# Patient Record
Sex: Male | Born: 1950 | Race: White | Hispanic: No | Marital: Married | Smoking: Former smoker
Health system: Southern US, Community
[De-identification: ages and names within clinical notes are randomized; demographics above are authoritative.]

## PROBLEM LIST (undated history)

## (undated) DIAGNOSIS — R569 Unspecified convulsions: Secondary | ICD-10-CM

## (undated) DIAGNOSIS — Z7901 Long term (current) use of anticoagulants: Secondary | ICD-10-CM

## (undated) DIAGNOSIS — I700 Atherosclerosis of aorta: Secondary | ICD-10-CM

## (undated) DIAGNOSIS — I639 Cerebral infarction, unspecified: Secondary | ICD-10-CM

## (undated) DIAGNOSIS — J439 Emphysema, unspecified: Secondary | ICD-10-CM

## (undated) DIAGNOSIS — I77819 Aortic ectasia, unspecified site: Secondary | ICD-10-CM

## (undated) DIAGNOSIS — F419 Anxiety disorder, unspecified: Secondary | ICD-10-CM

## (undated) DIAGNOSIS — I779 Disorder of arteries and arterioles, unspecified: Secondary | ICD-10-CM

## (undated) DIAGNOSIS — I671 Cerebral aneurysm, nonruptured: Secondary | ICD-10-CM

## (undated) DIAGNOSIS — R7303 Prediabetes: Secondary | ICD-10-CM

## (undated) DIAGNOSIS — C801 Malignant (primary) neoplasm, unspecified: Secondary | ICD-10-CM

## (undated) DIAGNOSIS — Z9289 Personal history of other medical treatment: Secondary | ICD-10-CM

## (undated) DIAGNOSIS — I739 Peripheral vascular disease, unspecified: Secondary | ICD-10-CM

## (undated) DIAGNOSIS — E785 Hyperlipidemia, unspecified: Secondary | ICD-10-CM

## (undated) DIAGNOSIS — I255 Ischemic cardiomyopathy: Secondary | ICD-10-CM

## (undated) DIAGNOSIS — Z95828 Presence of other vascular implants and grafts: Secondary | ICD-10-CM

## (undated) DIAGNOSIS — N189 Chronic kidney disease, unspecified: Secondary | ICD-10-CM

## (undated) DIAGNOSIS — I77810 Thoracic aortic ectasia: Secondary | ICD-10-CM

## (undated) DIAGNOSIS — K4090 Unilateral inguinal hernia, without obstruction or gangrene, not specified as recurrent: Secondary | ICD-10-CM

## (undated) DIAGNOSIS — I480 Paroxysmal atrial fibrillation: Secondary | ICD-10-CM

## (undated) DIAGNOSIS — M810 Age-related osteoporosis without current pathological fracture: Secondary | ICD-10-CM

## (undated) DIAGNOSIS — I10 Essential (primary) hypertension: Secondary | ICD-10-CM

## (undated) DIAGNOSIS — R040 Epistaxis: Secondary | ICD-10-CM

## (undated) DIAGNOSIS — K5730 Diverticulosis of large intestine without perforation or abscess without bleeding: Secondary | ICD-10-CM

## (undated) DIAGNOSIS — N183 Chronic kidney disease, stage 3 unspecified: Secondary | ICD-10-CM

## (undated) DIAGNOSIS — Z9889 Other specified postprocedural states: Secondary | ICD-10-CM

## (undated) DIAGNOSIS — F32A Depression, unspecified: Secondary | ICD-10-CM

## (undated) DIAGNOSIS — C3490 Malignant neoplasm of unspecified part of unspecified bronchus or lung: Secondary | ICD-10-CM

## (undated) DIAGNOSIS — I5022 Chronic systolic (congestive) heart failure: Secondary | ICD-10-CM

## (undated) DIAGNOSIS — I509 Heart failure, unspecified: Secondary | ICD-10-CM

## (undated) DIAGNOSIS — I2510 Atherosclerotic heart disease of native coronary artery without angina pectoris: Secondary | ICD-10-CM

## (undated) DIAGNOSIS — J449 Chronic obstructive pulmonary disease, unspecified: Secondary | ICD-10-CM

## (undated) DIAGNOSIS — I499 Cardiac arrhythmia, unspecified: Secondary | ICD-10-CM

## (undated) HISTORY — DX: Malignant (primary) neoplasm, unspecified: C80.1

## (undated) HISTORY — DX: Heart failure, unspecified: I50.9

## (undated) HISTORY — PX: TOTAL HIP ARTHROPLASTY: SHX124

## (undated) HISTORY — DX: Chronic kidney disease, stage 3 unspecified: N18.30

## (undated) HISTORY — DX: Malignant neoplasm of unspecified part of unspecified bronchus or lung: C34.90

## (undated) HISTORY — DX: Disorder of arteries and arterioles, unspecified: I77.9

## (undated) HISTORY — DX: Age-related osteoporosis without current pathological fracture: M81.0

## (undated) HISTORY — DX: Peripheral vascular disease, unspecified: I73.9

## (undated) HISTORY — PX: OTHER SURGICAL HISTORY: SHX169

## (undated) HISTORY — DX: Paroxysmal atrial fibrillation: I48.0

## (undated) HISTORY — DX: Emphysema, unspecified: J43.9

## (undated) HISTORY — PX: CORONARY ANGIOPLASTY WITH STENT PLACEMENT: SHX49

## (undated) HISTORY — DX: Thoracic aortic ectasia: I77.810

## (undated) HISTORY — DX: Chronic systolic (congestive) heart failure: I50.22

## (undated) HISTORY — DX: Aortic ectasia, unspecified site: I77.819

## (undated) HISTORY — DX: Unspecified convulsions: R56.9

## (undated) HISTORY — DX: Chronic kidney disease, unspecified: N18.9

---

## 2017-05-28 DIAGNOSIS — I63411 Cerebral infarction due to embolism of right middle cerebral artery: Secondary | ICD-10-CM | POA: Insufficient documentation

## 2017-05-29 DIAGNOSIS — I69354 Hemiplegia and hemiparesis following cerebral infarction affecting left non-dominant side: Secondary | ICD-10-CM | POA: Insufficient documentation

## 2017-05-29 DIAGNOSIS — I6521 Occlusion and stenosis of right carotid artery: Secondary | ICD-10-CM | POA: Insufficient documentation

## 2017-05-31 DIAGNOSIS — E7849 Other hyperlipidemia: Secondary | ICD-10-CM | POA: Insufficient documentation

## 2017-05-31 DIAGNOSIS — I10 Essential (primary) hypertension: Secondary | ICD-10-CM | POA: Insufficient documentation

## 2017-06-07 DIAGNOSIS — I2699 Other pulmonary embolism without acute cor pulmonale: Secondary | ICD-10-CM

## 2017-06-07 HISTORY — DX: Other pulmonary embolism without acute cor pulmonale: I26.99

## 2017-06-10 DIAGNOSIS — S301XXA Contusion of abdominal wall, initial encounter: Secondary | ICD-10-CM | POA: Insufficient documentation

## 2017-06-10 DIAGNOSIS — Z720 Tobacco use: Secondary | ICD-10-CM | POA: Insufficient documentation

## 2017-06-10 DIAGNOSIS — I714 Abdominal aortic aneurysm, without rupture, unspecified: Secondary | ICD-10-CM | POA: Insufficient documentation

## 2017-06-10 DIAGNOSIS — M549 Dorsalgia, unspecified: Secondary | ICD-10-CM | POA: Insufficient documentation

## 2017-06-10 DIAGNOSIS — I739 Peripheral vascular disease, unspecified: Secondary | ICD-10-CM | POA: Insufficient documentation

## 2017-06-10 DIAGNOSIS — J432 Centrilobular emphysema: Secondary | ICD-10-CM | POA: Insufficient documentation

## 2017-06-10 DIAGNOSIS — I5022 Chronic systolic (congestive) heart failure: Secondary | ICD-10-CM | POA: Insufficient documentation

## 2017-06-10 DIAGNOSIS — R7989 Other specified abnormal findings of blood chemistry: Secondary | ICD-10-CM | POA: Insufficient documentation

## 2017-06-10 DIAGNOSIS — G8929 Other chronic pain: Secondary | ICD-10-CM | POA: Insufficient documentation

## 2017-06-10 DIAGNOSIS — R634 Abnormal weight loss: Secondary | ICD-10-CM | POA: Insufficient documentation

## 2017-06-10 DIAGNOSIS — I2699 Other pulmonary embolism without acute cor pulmonale: Secondary | ICD-10-CM | POA: Insufficient documentation

## 2017-06-10 DIAGNOSIS — I480 Paroxysmal atrial fibrillation: Secondary | ICD-10-CM | POA: Insufficient documentation

## 2017-06-10 DIAGNOSIS — R0609 Other forms of dyspnea: Secondary | ICD-10-CM | POA: Insufficient documentation

## 2017-07-08 HISTORY — PX: CORONARY ANGIOPLASTY WITH STENT PLACEMENT: SHX49

## 2017-09-20 DIAGNOSIS — Z955 Presence of coronary angioplasty implant and graft: Secondary | ICD-10-CM | POA: Insufficient documentation

## 2018-02-14 DIAGNOSIS — I6522 Occlusion and stenosis of left carotid artery: Secondary | ICD-10-CM | POA: Insufficient documentation

## 2018-07-12 DIAGNOSIS — I714 Abdominal aortic aneurysm, without rupture, unspecified: Secondary | ICD-10-CM

## 2018-07-12 HISTORY — DX: Abdominal aortic aneurysm, without rupture: I71.4

## 2018-07-12 HISTORY — DX: Abdominal aortic aneurysm, without rupture, unspecified: I71.40

## 2020-02-01 ENCOUNTER — Encounter: Payer: Self-pay | Admitting: Emergency Medicine

## 2020-02-01 ENCOUNTER — Observation Stay: Payer: Medicare Other

## 2020-02-01 ENCOUNTER — Emergency Department: Payer: Medicare Other

## 2020-02-01 ENCOUNTER — Observation Stay
Admission: EM | Admit: 2020-02-01 | Discharge: 2020-02-02 | Disposition: A | Discharge status: Home / Self Care | Payer: Medicare Other | Attending: Internal Medicine | Admitting: Internal Medicine

## 2020-02-01 ENCOUNTER — Observation Stay (HOSPITAL_BASED_OUTPATIENT_CLINIC_OR_DEPARTMENT_OTHER)
Admit: 2020-02-01 | Discharge: 2020-02-01 | Disposition: A | Discharge status: Home / Self Care | Payer: Medicare Other | Attending: Internal Medicine | Admitting: Internal Medicine

## 2020-02-01 ENCOUNTER — Other Ambulatory Visit: Payer: Self-pay

## 2020-02-01 DIAGNOSIS — I2510 Atherosclerotic heart disease of native coronary artery without angina pectoris: Secondary | ICD-10-CM | POA: Diagnosis present

## 2020-02-01 DIAGNOSIS — Z20822 Contact with and (suspected) exposure to covid-19: Secondary | ICD-10-CM | POA: Diagnosis not present

## 2020-02-01 DIAGNOSIS — E785 Hyperlipidemia, unspecified: Secondary | ICD-10-CM | POA: Insufficient documentation

## 2020-02-01 DIAGNOSIS — Z7982 Long term (current) use of aspirin: Secondary | ICD-10-CM | POA: Diagnosis not present

## 2020-02-01 DIAGNOSIS — I639 Cerebral infarction, unspecified: Secondary | ICD-10-CM | POA: Diagnosis not present

## 2020-02-01 DIAGNOSIS — R531 Weakness: Secondary | ICD-10-CM | POA: Diagnosis present

## 2020-02-01 DIAGNOSIS — R0600 Dyspnea, unspecified: Secondary | ICD-10-CM

## 2020-02-01 DIAGNOSIS — I6529 Occlusion and stenosis of unspecified carotid artery: Secondary | ICD-10-CM | POA: Diagnosis not present

## 2020-02-01 DIAGNOSIS — I110 Hypertensive heart disease with heart failure: Secondary | ICD-10-CM | POA: Diagnosis not present

## 2020-02-01 DIAGNOSIS — R0609 Other forms of dyspnea: Secondary | ICD-10-CM

## 2020-02-01 DIAGNOSIS — Z79899 Other long term (current) drug therapy: Secondary | ICD-10-CM | POA: Diagnosis not present

## 2020-02-01 DIAGNOSIS — I480 Paroxysmal atrial fibrillation: Secondary | ICD-10-CM | POA: Diagnosis not present

## 2020-02-01 DIAGNOSIS — I4891 Unspecified atrial fibrillation: Secondary | ICD-10-CM | POA: Diagnosis not present

## 2020-02-01 DIAGNOSIS — I5022 Chronic systolic (congestive) heart failure: Secondary | ICD-10-CM | POA: Diagnosis present

## 2020-02-01 DIAGNOSIS — R29898 Other symptoms and signs involving the musculoskeletal system: Secondary | ICD-10-CM | POA: Diagnosis not present

## 2020-02-01 DIAGNOSIS — K219 Gastro-esophageal reflux disease without esophagitis: Secondary | ICD-10-CM | POA: Diagnosis present

## 2020-02-01 DIAGNOSIS — F329 Major depressive disorder, single episode, unspecified: Secondary | ICD-10-CM | POA: Insufficient documentation

## 2020-02-01 DIAGNOSIS — J449 Chronic obstructive pulmonary disease, unspecified: Secondary | ICD-10-CM | POA: Insufficient documentation

## 2020-02-01 DIAGNOSIS — F32A Depression, unspecified: Secondary | ICD-10-CM | POA: Diagnosis present

## 2020-02-01 DIAGNOSIS — Z7901 Long term (current) use of anticoagulants: Secondary | ICD-10-CM | POA: Insufficient documentation

## 2020-02-01 HISTORY — DX: Presence of other vascular implants and grafts: Z98.890

## 2020-02-01 HISTORY — DX: Chronic obstructive pulmonary disease, unspecified: J44.9

## 2020-02-01 HISTORY — DX: Essential (primary) hypertension: I10

## 2020-02-01 HISTORY — DX: Cerebral aneurysm, nonruptured: I67.1

## 2020-02-01 HISTORY — DX: Cerebral infarction, unspecified: I63.9

## 2020-02-01 HISTORY — DX: Presence of other vascular implants and grafts: Z95.828

## 2020-02-01 LAB — URINALYSIS, COMPLETE (UACMP) WITH MICROSCOPIC
Bacteria, UA: NONE SEEN
Bilirubin Urine: NEGATIVE
Glucose, UA: NEGATIVE mg/dL
Ketones, ur: NEGATIVE mg/dL
Leukocytes,Ua: NEGATIVE
Nitrite: NEGATIVE
Protein, ur: 30 mg/dL — AB
Specific Gravity, Urine: 1.019 (ref 1.005–1.030)
pH: 5 (ref 5.0–8.0)

## 2020-02-01 LAB — COMPREHENSIVE METABOLIC PANEL
ALT: 16 U/L (ref 0–44)
AST: 23 U/L (ref 15–41)
Albumin: 4.3 g/dL (ref 3.5–5.0)
Alkaline Phosphatase: 91 U/L (ref 38–126)
Anion gap: 10 (ref 5–15)
BUN: 22 mg/dL (ref 8–23)
CO2: 24 mmol/L (ref 22–32)
Calcium: 9.4 mg/dL (ref 8.9–10.3)
Chloride: 106 mmol/L (ref 98–111)
Creatinine, Ser: 1.14 mg/dL (ref 0.61–1.24)
GFR calc Af Amer: >60 mL/min (ref >60)
GFR calc non Af Amer: >60 mL/min (ref >60)
Glucose, Bld: 114 mg/dL — ABNORMAL HIGH (ref 70–99)
Potassium: 4.1 mmol/L (ref 3.5–5.1)
Sodium: 140 mmol/L (ref 135–145)
Total Bilirubin: 0.8 mg/dL (ref 0.3–1.2)
Total Protein: 8.3 g/dL — ABNORMAL HIGH (ref 6.5–8.1)

## 2020-02-01 LAB — CBC
HCT: 38.6 % — ABNORMAL LOW (ref 39.0–52.0)
Hemoglobin: 12 g/dL — ABNORMAL LOW (ref 13.0–17.0)
MCH: 28.6 pg (ref 26.0–34.0)
MCHC: 31.1 g/dL (ref 30.0–36.0)
MCV: 92.1 fL (ref 80.0–100.0)
Platelets: 145 10*3/uL — ABNORMAL LOW (ref 150–400)
RBC: 4.19 MIL/uL — ABNORMAL LOW (ref 4.22–5.81)
RDW: 14.8 % (ref 11.5–15.5)
WBC: 4.2 10*3/uL (ref 4.0–10.5)
nRBC: 0 % (ref 0.0–0.2)

## 2020-02-01 LAB — BRAIN NATRIURETIC PEPTIDE: B Natriuretic Peptide: 293 pg/mL — ABNORMAL HIGH (ref 0.0–100.0)

## 2020-02-01 LAB — LIPID PANEL
Cholesterol: 166 mg/dL (ref 0–200)
HDL: 49 mg/dL (ref >40)
LDL Cholesterol: 101 mg/dL — ABNORMAL HIGH (ref 0–99)
Total CHOL/HDL Ratio: 3.4 RATIO
Triglycerides: 79 mg/dL (ref <150)
VLDL: 16 mg/dL (ref 0–40)

## 2020-02-01 LAB — TROPONIN I (HIGH SENSITIVITY)
Troponin I (High Sensitivity): 10 ng/L (ref <18)
Troponin I (High Sensitivity): 11 ng/L (ref <18)

## 2020-02-01 LAB — MAGNESIUM: Magnesium: 1.9 mg/dL (ref 1.7–2.4)

## 2020-02-01 LAB — TSH: TSH: 2.454 u[IU]/mL (ref 0.350–4.500)

## 2020-02-01 LAB — SARS CORONAVIRUS 2 BY RT PCR (HOSPITAL ORDER, PERFORMED IN ~~LOC~~ HOSPITAL LAB): SARS Coronavirus 2: NEGATIVE

## 2020-02-01 MED ORDER — ONDANSETRON HCL 4 MG PO TABS: 4.0000 mg | ORAL_TABLET | Freq: Four times a day (QID) | ORAL | Status: DC | PRN

## 2020-02-01 MED ORDER — ALBUTEROL SULFATE (2.5 MG/3ML) 0.083% IN NEBU: 2.5000 mg | INHALATION_SOLUTION | RESPIRATORY_TRACT | Status: DC | PRN

## 2020-02-01 MED ORDER — APIXABAN 5 MG PO TABS
5.0000 mg | ORAL_TABLET | Freq: Two times a day (BID) | ORAL | Status: DC
  Administered 2020-02-01 – 2020-02-02 (×2): 5 mg via ORAL
  Filled 2020-02-01 (×2): qty 1

## 2020-02-01 MED ORDER — SACUBITRIL-VALSARTAN 49-51 MG PO TABS
0.5000 | ORAL_TABLET | Freq: Two times a day (BID) | ORAL | Status: DC
  Administered 2020-02-01 – 2020-02-02 (×2): 0.5 via ORAL
  Filled 2020-02-01 (×3): qty 0.5

## 2020-02-01 MED ORDER — FLUTICASONE FUROATE-VILANTEROL 100-25 MCG/INH IN AEPB
1.0000 | INHALATION_SPRAY | Freq: Every day | RESPIRATORY_TRACT | Status: DC
  Administered 2020-02-02: 1 via RESPIRATORY_TRACT
  Filled 2020-02-01: qty 28

## 2020-02-01 MED ORDER — FUROSEMIDE 20 MG PO TABS
20.0000 mg | ORAL_TABLET | Freq: Every day | ORAL | Status: DC
  Administered 2020-02-02: 20 mg via ORAL
  Filled 2020-02-01: qty 1

## 2020-02-01 MED ORDER — UMECLIDINIUM BROMIDE 62.5 MCG/INH IN AEPB
1.0000 | INHALATION_SPRAY | Freq: Every day | RESPIRATORY_TRACT | Status: DC
  Administered 2020-02-02: 08:00:00 1 via RESPIRATORY_TRACT
  Filled 2020-02-01: qty 7

## 2020-02-01 MED ORDER — SENNOSIDES-DOCUSATE SODIUM 8.6-50 MG PO TABS: 1.0000 | ORAL_TABLET | Freq: Every evening | ORAL | Status: DC | PRN

## 2020-02-01 MED ORDER — SPIRONOLACTONE 25 MG PO TABS
12.5000 mg | ORAL_TABLET | Freq: Every day | ORAL | Status: DC
  Administered 2020-02-01: 12.5 mg via ORAL
  Filled 2020-02-01: qty 0.5
  Filled 2020-02-01: qty 1
  Filled 2020-02-01: qty 0.5

## 2020-02-01 MED ORDER — FLUTICASONE-UMECLIDIN-VILANT 100-62.5-25 MCG/INH IN AEPB: 1.0000 | INHALATION_SPRAY | Freq: Every day | RESPIRATORY_TRACT | Status: DC

## 2020-02-01 MED ORDER — ATORVASTATIN CALCIUM 20 MG PO TABS
40.0000 mg | ORAL_TABLET | Freq: Every day | ORAL | Status: DC
  Administered 2020-02-01: 40 mg via ORAL
  Filled 2020-02-01: qty 2

## 2020-02-01 MED ORDER — IOHEXOL 350 MG/ML SOLN
75.0000 mL | Freq: Once | INTRAVENOUS | Status: AC | PRN
  Administered 2020-02-01: 75 mL via INTRAVENOUS

## 2020-02-01 MED ORDER — IPRATROPIUM-ALBUTEROL 0.5-2.5 (3) MG/3ML IN SOLN: 3.0000 mL | Freq: Four times a day (QID) | RESPIRATORY_TRACT | Status: DC | PRN

## 2020-02-01 MED ORDER — ONDANSETRON HCL 4 MG/2ML IJ SOLN: 4.0000 mg | Freq: Four times a day (QID) | INTRAMUSCULAR | Status: DC | PRN

## 2020-02-01 MED ORDER — CARVEDILOL 12.5 MG PO TABS
12.5000 mg | ORAL_TABLET | Freq: Two times a day (BID) | ORAL | Status: DC
  Administered 2020-02-02: 12.5 mg via ORAL
  Filled 2020-02-01: qty 1

## 2020-02-01 MED ORDER — ACETAMINOPHEN 325 MG PO TABS: 650.0000 mg | ORAL_TABLET | Freq: Four times a day (QID) | ORAL | Status: DC | PRN

## 2020-02-01 MED ORDER — ASPIRIN EC 81 MG PO TBEC
81.0000 mg | DELAYED_RELEASE_TABLET | Freq: Every day | ORAL | Status: DC
  Administered 2020-02-02: 81 mg via ORAL
  Filled 2020-02-01: qty 1

## 2020-02-01 MED ORDER — ACETAMINOPHEN 650 MG RE SUPP: 650.0000 mg | Freq: Four times a day (QID) | RECTAL | Status: DC | PRN

## 2020-02-01 MED ORDER — TAMSULOSIN HCL 0.4 MG PO CAPS
0.4000 mg | ORAL_CAPSULE | Freq: Every day | ORAL | Status: DC
  Administered 2020-02-02: 0.4 mg via ORAL
  Filled 2020-02-01: qty 1

## 2020-02-01 NOTE — Consult Note (Signed)
Requesting Physician: Dr. Jimmye Norman    Chief Complaint: Unable to walk  History obtained from: Patient and Chart   HPI:                                                                                                                                       Richard Wiggins is a 69 y.o. 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 fib on Eliquis, coronary disease status post PCI on aspirin 81 mg, left carotid stenosis presents to the emergency department after having difficulty ambulating this morning.  Patient states he woke up at 4:30 AM and went to the bathroom and sat on the couch to watch TV.  Around 4:45 AM when he got up from the couch he had difficulty taking any steps.  Patient stood there for close to an hour unable to move and felt that his knees have become significantly tight.  Spouse was concerned for recurrent stroke and brought him to the emergency department.  On assessment patient no longer has any weakness.  Able to ambulate without any difficulty.  His wife states that he has history of muscle cramps for several years also since the stroke she has been concerned of his memory.  Patient and wife moved from [place] area to establish care in [place].  At baseline he walks with a cane following the stroke.  Blood pressure elevated here at 182 systolic.  Patient also has right hip arthritis  Date last known well: 02-01-20 Time last known well: 4:30 AM tPA Given: No, symptoms resolved and low suspicion for stroke NIHSS: 2 Baseline MRS 2   Past Medical History:  Diagnosis Date  . Brain aneurysm   . COPD (chronic obstructive pulmonary disease) ([place])   . History of common carotid artery stent placement   . Hypertension   . Stroke [facility])     Past Surgical History:  Procedure Laterality Date  . brain  aneurysm with clip    . CORONARY ANGIOPLASTY WITH STENT PLACEMENT      History reviewed. No pertinent family history. Social History:  reports that he has quit smoking. He has never used smokeless tobacco. He reports current alcohol use. He reports that he does not use drugs.  Allergies: No Known Allergies  Medications:  I reviewed home medications   ROS:                                                                                                                                     14 systems reviewed and negative except above    Examination:                                                                                                      General: Appears well-developed  Psych: Affect appropriate to situation Eyes: No scleral injection HENT: No OP obstrucion Head: Normocephalic.  Cardiovascular: Normal rate and regular rhythm. Respiratory: Effort normal and breath sounds normal to anterior ascultation GI: Soft.  No distension. There is no tenderness.  Skin: WDI    Neurological Examination Mental Status: Alert, oriented, thought content appropriate.  Speech fluent without evidence of aphasia. Able to follow 3 step commands without difficulty. Cranial Nerves: II: Visual fields grossly normal,  III,IV, VI: ptosis not present, extra-ocular motions intact bilaterally, pupils equal, round, reactive to light and accommodation V,VII: smile symmetric, facial light touch sensation normal bilaterally VIII: hearing normal bilaterally IX,X: uvula rises symmetrically XI: bilateral shoulder shrug XII: midline tongue extension Motor: Right : Upper extremity   5/5    Left:     Upper extremity   5/5  Lower extremity   4/5 (limited motion from pain)   Lower extremity   4/5 Tone and bulk: Slightly increased tone in the left lower extremity and upper extremity Sensory:  Pinprick and light touch intact throughout, bilaterally Deep Tendon Reflexes: 2+ and symmetric throughout Plantars: Right: downgoing   Left: downgoing Cerebellar: normal finger-to-nose, normal rapid alternating movements and normal heel-to-shin test Gait: normal gait and station     Lab Results: Basic Metabolic Panel: Recent Labs  Lab 02/01/20 1110  NA 140  K 4.1  CL 106  CO2 24  GLUCOSE 114*  BUN 22  CREATININE 1.14  CALCIUM 9.4    CBC: Recent Labs  Lab 02/01/20 1110  WBC 4.2  HGB 12.0*  HCT 38.6*  MCV 92.1  PLT 145*    Coagulation Studies: No results for input(s): LABPROT, INR in the last 72 hours.  Imaging: CT ANGIO HEAD W OR WO CONTRAST  Result Date: 02/01/2020 CLINICAL DATA:  Suspected stroke. EXAM: CT ANGIOGRAPHY HEAD AND NECK TECHNIQUE: Multidetector CT imaging of the head and neck was performed using the standard protocol during bolus administration of intravenous contrast. Multiplanar CT image reconstructions and MIPs were obtained  to evaluate the vascular anatomy. Carotid stenosis measurements (when applicable) are obtained utilizing NASCET criteria, using the distal internal carotid diameter as the denominator. CONTRAST:  52mL OMNIPAQUE IOHEXOL 350 MG/ML SOLN COMPARISON:  Prior same day noncontrast head CT. FINDINGS: CT HEAD FINDINGS Brain: Redemonstration right greater than left bilateral frontal hypodense regions. The presence of contrast limits evaluation of right frontal hyperdensities seen on prior noncontrast exam. Mild cerebral atrophy and chronic microvascular ischemic changes. Small remote left thalamic infarct. No midline shift, mass lesion or extra-axial fluid collection. Skull: No acute osseous abnormality. Sequela of left frontal craniotomy for aneurysm clipping. Sinuses: Clear paranasal sinuses.  No mastoid effusion. Orbits: No acute abnormalities. Review of the MIP images confirms the above findings CTA NECK FINDINGS Aortic arch: Standard branching.  Imaged portion shows no evidence of aneurysm or dissection. No significant stenosis of the major arch vessel origins. Atherosclerotic calcifications involving the aortic arch and great vessel origins. Right carotid system: No evidence of aneurysm, dissection, stenosis (50% or greater) or occlusion. Patent proximal right internal carotid artery stent. Left carotid system: Carotid bifurcation calcified and noncalcified atheromatous plaque with approximately 50% luminal narrowing of the proximal ICA. No evidence of dissection or aneurysm. Vertebral arteries: Dominant right vertebral artery. No aneurysm or dissection. No high-grade narrowing. Skeleton: Multilevel spondylosis. No acute osseous abnormality. Postprocedural appearance of the mandibular symphysis. Other neck: No adenopathy.  No soft tissue mass. Upper chest: Emphysema. Spiculated 0.9 x 0.9 cm dorsal left upper lung nodular opacity (504:16). Smaller spiculated opacities are seen within the right upper lung (504:13, 20). Review of the MIP images confirms the above findings CTA HEAD FINDINGS Anterior circulation: No significant stenosis, proximal occlusion, aneurysm, or vascular malformation. Bilateral carotid siphon atherosclerotic calcifications. Posterior circulation: No focal high-grade narrowing, aneurysm, or vascular malformation. Left V3 segment atherosclerotic calcifications with asymmetrically diminished opacification of the left V4 segment. Venous sinuses: As permitted by contrast timing, patent. Anatomic variants: None. Review of the MIP images confirms the above findings IMPRESSION: Redemonstration of bilateral frontal hypodense regions, concerning for age indeterminate infarcts. Further evaluation with MRI brain is recommended. Cerebral atrophy and chronic microvascular ischemic changes. Patent right proximal ICA stent. Left carotid bifurcation atheromatous disease with approximately 50% luminal narrowing of the proximal left ICA. Left V4 segment  atherosclerotic calcifications. Asymmetrically diminished opacification of the left V4 segment may be secondary to slow flow versus diminutive appearance of the vessel. Bilateral upper lung spiculated nodular opacities measuring up to 0.9 cm. Emphysema. Dedicated CT of the chest is recommended for further evaluation. Electronically Signed   By: Primitivo Gauze M.D.   On: 02/01/2020 14:38   CT Head Wo Contrast  Result Date: 02/01/2020 CLINICAL DATA:  TIA. EXAM: CT HEAD WITHOUT CONTRAST TECHNIQUE: Contiguous axial images were obtained from the base of the skull through the vertex without intravenous contrast. COMPARISON:  None. FINDINGS: Brain: Right frontal hypodensity with associated punctate hyperdense foci. A smaller focal hypodensity extending to the cortex involving the inferior left frontal lobe. Background scattered and confluent hypodense foci are nonspecific however commonly associated with chronic microvascular ischemic changes. Tiny left basal ganglia remote infarct versus dilated perivascular space. No midline shift, mass lesion or extra-axial fluid collection. No ventriculomegaly. Vascular: No hyperdense vessel. Bilateral carotid siphon atherosclerotic calcifications. Skull: No acute osseous abnormality. Sequela of prior left frontal craniotomy for aneurysm clip ligation. Sinuses/Orbits: Normal orbits. Clear paranasal sinuses. No mastoid effusion. Other: None. IMPRESSION: Age indeterminate bilateral MCA territory hypodensities. Curvilinear and tiny hyperdense right frontal foci may  reflect cortical laminar necrosis versus minimal subarachnoid hemorrhage. Further evaluation with MRI is recommended. Remote tiny left thalamic infarct versus dilated perivascular space. Mild cerebral atrophy and chronic microvascular ischemic change. These results were called by telephone at the time of interpretation on 02/01/2020 at 12:20 pm to provider Lenise Arena , who verbally acknowledged these results.  Electronically Signed   By: Primitivo Gauze M.D.   On: 02/01/2020 12:23   CT ANGIO NECK W OR WO CONTRAST  Result Date: 02/01/2020 CLINICAL DATA:  Suspected stroke. EXAM: CT ANGIOGRAPHY HEAD AND NECK TECHNIQUE: Multidetector CT imaging of the head and neck was performed using the standard protocol during bolus administration of intravenous contrast. Multiplanar CT image reconstructions and MIPs were obtained to evaluate the vascular anatomy. Carotid stenosis measurements (when applicable) are obtained utilizing NASCET criteria, using the distal internal carotid diameter as the denominator. CONTRAST:  53mL OMNIPAQUE IOHEXOL 350 MG/ML SOLN COMPARISON:  Prior same day noncontrast head CT. FINDINGS: CT HEAD FINDINGS Brain: Redemonstration right greater than left bilateral frontal hypodense regions. The presence of contrast limits evaluation of right frontal hyperdensities seen on prior noncontrast exam. Mild cerebral atrophy and chronic microvascular ischemic changes. Small remote left thalamic infarct. No midline shift, mass lesion or extra-axial fluid collection. Skull: No acute osseous abnormality. Sequela of left frontal craniotomy for aneurysm clipping. Sinuses: Clear paranasal sinuses.  No mastoid effusion. Orbits: No acute abnormalities. Review of the MIP images confirms the above findings CTA NECK FINDINGS Aortic arch: Standard branching. Imaged portion shows no evidence of aneurysm or dissection. No significant stenosis of the major arch vessel origins. Atherosclerotic calcifications involving the aortic arch and great vessel origins. Right carotid system: No evidence of aneurysm, dissection, stenosis (50% or greater) or occlusion. Patent proximal right internal carotid artery stent. Left carotid system: Carotid bifurcation calcified and noncalcified atheromatous plaque with approximately 50% luminal narrowing of the proximal ICA. No evidence of dissection or aneurysm. Vertebral arteries: Dominant right  vertebral artery. No aneurysm or dissection. No high-grade narrowing. Skeleton: Multilevel spondylosis. No acute osseous abnormality. Postprocedural appearance of the mandibular symphysis. Other neck: No adenopathy.  No soft tissue mass. Upper chest: Emphysema. Spiculated 0.9 x 0.9 cm dorsal left upper lung nodular opacity (504:16). Smaller spiculated opacities are seen within the right upper lung (504:13, 20). Review of the MIP images confirms the above findings CTA HEAD FINDINGS Anterior circulation: No significant stenosis, proximal occlusion, aneurysm, or vascular malformation. Bilateral carotid siphon atherosclerotic calcifications. Posterior circulation: No focal high-grade narrowing, aneurysm, or vascular malformation. Left V3 segment atherosclerotic calcifications with asymmetrically diminished opacification of the left V4 segment. Venous sinuses: As permitted by contrast timing, patent. Anatomic variants: None. Review of the MIP images confirms the above findings IMPRESSION: Redemonstration of bilateral frontal hypodense regions, concerning for age indeterminate infarcts. Further evaluation with MRI brain is recommended. Cerebral atrophy and chronic microvascular ischemic changes. Patent right proximal ICA stent. Left carotid bifurcation atheromatous disease with approximately 50% luminal narrowing of the proximal left ICA. Left V4 segment atherosclerotic calcifications. Asymmetrically diminished opacification of the left V4 segment may be secondary to slow flow versus diminutive appearance of the vessel. Bilateral upper lung spiculated nodular opacities measuring up to 0.9 cm. Emphysema. Dedicated CT of the chest is recommended for further evaluation. Electronically Signed   By: Primitivo Gauze M.D.   On: 02/01/2020 14:38     ASSESSMENT AND PLAN  69 y.o. male with past medical history significant for chronic systolic heart failure, left MCA aneurysm status post clipping,  right MCA ischemic infarct  secondary to right carotid stenosis status post stent in 2018, paroxysmal A. fib on Eliquis presents with bilateral lower extremity spasticity versus weakness.  Parkinson's unlikely based on exam-no significant bradykinesia.  Suspect patient has spasticity from prior right MCA stroke.  Low suspicion for  New stroke.  Bilateral leg weakness vs spasticity  Recommendations CT angiogram of the head and neck posterior circulation occlusion or ACA stenosis/occlusion Follow-up with outpatient neurology Continue aspirin and  Eliquis Consider Flexeril 5mg  QHS for spasticity  Neurology will be available as needed  [place] Pager Number [phone]

## 2020-02-01 NOTE — ED Provider Notes (Signed)
ER Provider Note       Time seen: 12:33 PM    I have reviewed the vital signs and the nursing notes.  HISTORY   Chief Complaint Extremity Weakness    HPI Richard Wiggins is a 69 y.o. male with a history of brain aneurysm, COPD, hypertension, CVA who presents today for leg weakness this morning.  At 4:30 AM patient was awake watching TV and got up and was standing but felt like he could not move either leg.  No arm weakness.  Patient reports this lasted 1 hour and when the wife brought him a cane he was able to sit down.  He is currently on Eliquis.  Past Medical History:  Diagnosis Date  . Brain aneurysm   . COPD (chronic obstructive pulmonary disease) ([place])   . History of common carotid artery stent placement   . Hypertension   . Stroke [facility])     Past Surgical History:  Procedure Laterality Date  . brain aneurysm with clip    . CORONARY ANGIOPLASTY WITH STENT PLACEMENT      Allergies Patient has no known allergies.  Review of Systems Constitutional: Negative for fever. Cardiovascular: Negative for chest pain. Respiratory: Negative for shortness of breath. Gastrointestinal: Negative for abdominal pain, vomiting and diarrhea. Musculoskeletal: Negative for back pain. Skin: Negative for rash. Neurological: Negative for headaches, focal weakness or numbness.  Positive for leg weakness  All systems negative/normal/unremarkable except as stated in the HPI  ____________________________________________   PHYSICAL EXAM:  VITAL SIGNS: Vitals:   02/01/20 1100  BP: (!) 188/97  Pulse: 65  Resp: 18  Temp: (!) 97.5 F (36.4 C)  SpO2: 99%    Constitutional: Alert and oriented. Well appearing and in no distress. Eyes: Conjunctivae are normal. Normal extraocular movements. Cardiovascular: Normal rate, regular rhythm. No murmurs, rubs, or gallops. Respiratory: Normal respiratory effort without tachypnea nor retractions. Breath sounds are clear and equal bilaterally.  No wheezes/rales/rhonchi. Gastrointestinal: Soft and nontender. Normal bowel sounds Musculoskeletal: Nontender with normal range of motion in extremities. No lower extremity tenderness nor edema. Neurologic:  Normal speech and language. No gross focal neurologic deficits are appreciated.  Cranial nerves, finger-to-nose testing, strength and sensation appear to be within normal limits Skin:  Skin is warm, dry and intact. No rash noted. Psychiatric: Speech and behavior are normal.  ____________________________________________  EKG: Interpreted by me.  Sinus rhythm with premature supraventricular complexes, normal axis, normal QT  ____________________________________________   LABS (pertinent positives/negatives)  Labs Reviewed  CBC - Abnormal; Notable for the following components:      Result Value   RBC 4.19 (*)    Hemoglobin 12.0 (*)    HCT 38.6 (*)    Platelets 145 (*)    All other components within normal limits  URINALYSIS, COMPLETE (UACMP) WITH MICROSCOPIC - Abnormal; Notable for the following components:   Color, Urine YELLOW (*)    APPearance HAZY (*)    Hgb urine dipstick SMALL (*)    Protein, ur 30 (*)    All other components within normal limits  COMPREHENSIVE METABOLIC PANEL - Abnormal; Notable for the following components:   Glucose, Bld 114 (*)    Total Protein 8.3 (*)    All other components within normal limits  CBG MONITORING, ED  TROPONIN I (HIGH SENSITIVITY)  TROPONIN I (HIGH SENSITIVITY)   CRITICAL CARE Performed by: Laurence Aly   Total critical care time: 30 minutes  Critical care time was exclusive of separately billable procedures and  treating other patients.  Critical care was necessary to treat or prevent imminent or life-threatening deterioration.  Critical care was time spent personally by me on the following activities: development of treatment plan with patient and/or surrogate as well as nursing, discussions with consultants,  evaluation of patient's response to treatment, examination of patient, obtaining history from patient or surrogate, ordering and performing treatments and interventions, ordering and review of laboratory studies, ordering and review of radiographic studies, pulse oximetry and re-evaluation of patient's condition.  RADIOLOGY  Images were viewed by me CT head IMPRESSION: Age indeterminate bilateral MCA territory hypodensities. Curvilinear and tiny hyperdense right frontal foci may reflect cortical laminar necrosis versus minimal subarachnoid hemorrhage.  Further evaluation with MRI is recommended.  Remote tiny left thalamic infarct versus dilated perivascular space.  Mild cerebral atrophy and chronic microvascular ischemic change.  These results were called by telephone at the time of interpretation on 02/01/2020 at 12:20 pm to provider Lenise Arena , who verbally acknowledged these results.   DIFFERENTIAL DIAGNOSIS  CVA, TIA, subdural, dehydration, electrolyte abnormality  ASSESSMENT AND PLAN  CVA   Plan: The patient had presented for bilateral leg weakness. Patient's labs are unremarkable.  No clear etiology for his stroke which appears to have a bilateral component.  MRIs not possible because of his previous aneurysmal clipping.  It is unclear what additional medications he might require.  I will discuss with neurology and we will discuss with the hospitalist for admission.  Lenise Arena MD    Note: This note was generated in part or whole with voice recognition software. Voice recognition is usually quite accurate but there are transcription errors that can and very often do occur. I apologize for any typographical errors that were not detected and corrected.     Earleen Newport, MD 02/01/20 [phone]

## 2020-02-01 NOTE — H&P (Addendum)
History and Physical    Richard Wiggins FIE:[id number] DOB: 1951-05-29 DOA: 02/01/2020  PCP: Patient, No Pcp Per  Patient coming from: Home  I have personally briefly reviewed patient's old medical records in [place]  Chief Complaint: Bilateral lower extremity weakness  HPI: Richard Wiggins is a 69 y.o. male with medical history significant of chronic systolic congestive heart failure, history of brain aneurysm status post clipping, COPD, essential hypertension, paroxysmal atrial fibrillation on Eliquis, CAD status post PCI, carotid stenosis, depression, HLD, and BPH who presented to ER with progressive bilateral lower extremity weakness.  This patient reports progressive weakness since his stroke but was unable to completely move his legs earlier this last afternoon lasting approximately 1 hour.  Given his significant history including stroke, patient spouse was concerned about recurrence in which she proceeded to bring him to the emergency department.  Patient states he recently moved from [place] to the area and has yet to establish care with PCP or specialist.  He reports compliance with the majority of his medications except he has been out of his home tamsulosin and he rarely uses his furosemide..   ED Course: In the ED, temperature 97.5, HR 59, RR 30, BP 187/92, SPO2 100% room air.  WBC 4.2, hemoglobin 12.0, platelet 145.  Sodium 140, potassium 4.1, chloride 106, CO2 24, BUN 22, creatinine 1.14, glucose 114.  Troponin 10.  Urinalysis negative.  CT head with age-indeterminate bilateral MCA territory hypodensities secondary to cortical laminar necrosis versus minimal subarachnoid hemorrhage. ED physician consulted neurology for evaluation given his bilateral lower extremity weakness coupled with CT head findings.  Given patient's inability to ambulate much which is a significant decline in his baseline and underlying findings and currently on established with local medical care, ED  physician consulted [facility] for evaluation and further treatment under observation.   Review of Systems: As per HPI otherwise 10 point review of systems negative.    Past Medical History:  Diagnosis Date  . Brain aneurysm   . COPD (chronic obstructive pulmonary disease) ([employer])   . History of common carotid artery stent placement   . Hypertension   . Stroke [facility])     Past Surgical History:  Procedure Laterality Date  . brain aneurysm with clip    . CORONARY ANGIOPLASTY WITH STENT PLACEMENT       reports that he has quit smoking. He has never used smokeless tobacco. He reports current alcohol use. He reports that he does not use drugs.  No Known Allergies  History reviewed. No pertinent family history.  Family history reviewed and not pertinent   Prior to Admission medications   Medication Sig Start Date End Date Taking? Authorizing Provider  albuterol (VENTOLIN HFA) 108 (90 Base) MCG/ACT inhaler Inhale 2 puffs into the lungs every 4 (four) hours as needed.   Yes [provider]  apixaban (ELIQUIS) 5 MG TABS tablet Take 5 mg by mouth in the morning and at bedtime.   Yes [provider]  aspirin EC 81 MG tablet Take 81 mg by mouth daily. Swallow whole.   Yes [provider]  atorvastatin (LIPITOR) 40 MG tablet Take 40 mg by mouth at bedtime.   Yes [provider]  carvedilol (COREG) 25 MG tablet Take 12.5 mg by mouth in the morning and at bedtime.   Yes [provider]  Fluticasone-Umeclidin-Vilant (TRELEGY ELLIPTA) 100-62.5-25 MCG/INH AEPB Inhale 1 puff into the lungs daily. 01/26/18  Yes [provider]  furosemide (LASIX) 20 MG  tablet Take 1-2 tablets by mouth daily as needed for fluid. 11/18/17  Yes [provider]  ipratropium-albuterol (DUONEB) 0.5-2.5 (3) MG/3ML SOLN Inhale 3 mLs into the lungs every 6 (six) hours as needed. 09/30/17  Yes [provider]  sacubitril-valsartan (ENTRESTO) 49-51 MG Take 0.5  tablets by mouth in the morning and at bedtime.   Yes [provider]  spironolactone (ALDACTONE) 25 MG tablet Take 12.5 mg by mouth at bedtime.   Yes [provider]    Physical Exam: Vitals:   02/01/20 1057 02/01/20 1100 02/01/20 1327  BP:  (!) 188/97 (!) 187/92  Pulse:  65 59  Resp:  18 (!) 30  Temp:  (!) 97.5 F (36.4 C)   TempSrc:  Oral   SpO2:  99% 100%  Weight: (!) 93.4 kg    Height: 5\' 10"  (1.778 m)      Constitutional: NAD, calm, comfortable Eyes: PERRL, lids and conjunctivae normal ENMT: Mucous membranes are moist. Posterior pharynx clear of any exudate or lesions.Normal dentition.  Neck: normal, supple, no masses, no thyromegaly Respiratory: Early inspiratory wheezing appreciated greatest on left, no crackles, normal respiratory effort, oxygenating well on room air Cardiovascular: Regular rate and rhythm, no murmurs / rubs / gallops. No extremity edema. 2+ pedal pulses. No carotid bruits.  Abdomen: no tenderness, no masses palpated. No hepatosplenomegaly. Bowel sounds positive.  Musculoskeletal: no clubbing / cyanosis. No joint deformity upper and lower extremities. Good ROM, no contractures. Normal muscle tone.  Skin: no rashes, lesions, ulcers. No induration Neurologic: CN 2-12 grossly intact. Sensation intact, DTR normal. Strength 5/5 in all 4.  Psychiatric: Normal judgment and insight. Alert and oriented x 3. Normal mood.    Labs on Admission: I have personally reviewed following labs and imaging studies  CBC: Recent Labs  Lab 02/01/20 1110  WBC 4.2  HGB 12.0*  HCT 38.6*  MCV 92.1  PLT 585*   Basic Metabolic Panel: Recent Labs  Lab 02/01/20 1110  NA 140  K 4.1  CL 106  CO2 24  GLUCOSE 114*  BUN 22  CREATININE 1.14  CALCIUM 9.4   GFR: Estimated Creatinine Clearance: 70.2 mL/min (by C-G formula based on SCr of 1.14 mg/dL). Liver Function Tests: Recent Labs  Lab 02/01/20 1110  AST 23  ALT 16  ALKPHOS 91  BILITOT 0.8  PROT  8.3*  ALBUMIN 4.3   No results for input(s): LIPASE, AMYLASE in the last 168 hours. No results for input(s): AMMONIA in the last 168 hours. Coagulation Profile: No results for input(s): INR, PROTIME in the last 168 hours. Cardiac Enzymes: No results for input(s): CKTOTAL, CKMB, CKMBINDEX, TROPONINI in the last 168 hours. BNP (last 3 results) No results for input(s): PROBNP in the last 8760 hours. HbA1C: No results for input(s): HGBA1C in the last 72 hours. CBG: No results for input(s): GLUCAP in the last 168 hours. Lipid Profile: No results for input(s): CHOL, HDL, LDLCALC, TRIG, CHOLHDL, LDLDIRECT in the last 72 hours. Thyroid Function Tests: No results for input(s): TSH, T4TOTAL, FREET4, T3FREE, THYROIDAB in the last 72 hours. Anemia Panel: No results for input(s): VITAMINB12, FOLATE, FERRITIN, TIBC, IRON, RETICCTPCT in the last 72 hours. Urine analysis:    Component Value Date/Time   COLORURINE YELLOW (A) 02/01/2020 1110   APPEARANCEUR HAZY (A) 02/01/2020 1110   LABSPEC 1.019 02/01/2020 1110   PHURINE 5.0 02/01/2020 1110   GLUCOSEU NEGATIVE 02/01/2020 1110   HGBUR SMALL (A) 02/01/2020 1110   BILIRUBINUR NEGATIVE 02/01/2020 1110  KETONESUR NEGATIVE 02/01/2020 1110   PROTEINUR 30 (A) 02/01/2020 1110   NITRITE NEGATIVE 02/01/2020 [place] 02/01/2020 1110    Radiological Exams on Admission: CT ANGIO HEAD W OR WO CONTRAST  Result Date: 02/01/2020 CLINICAL DATA:  Suspected stroke. EXAM: CT ANGIOGRAPHY HEAD AND NECK TECHNIQUE: Multidetector CT imaging of the head and neck was performed using the standard protocol during bolus administration of intravenous contrast. Multiplanar CT image reconstructions and MIPs were obtained to evaluate the vascular anatomy. Carotid stenosis measurements (when applicable) are obtained utilizing NASCET criteria, using the distal internal carotid diameter as the denominator. CONTRAST:  58mL OMNIPAQUE IOHEXOL 350 MG/ML SOLN  COMPARISON:  Prior same day noncontrast head CT. FINDINGS: CT HEAD FINDINGS Brain: Redemonstration right greater than left bilateral frontal hypodense regions. The presence of contrast limits evaluation of right frontal hyperdensities seen on prior noncontrast exam. Mild cerebral atrophy and chronic microvascular ischemic changes. Small remote left thalamic infarct. No midline shift, mass lesion or extra-axial fluid collection. Skull: No acute osseous abnormality. Sequela of left frontal craniotomy for aneurysm clipping. Sinuses: Clear paranasal sinuses.  No mastoid effusion. Orbits: No acute abnormalities. Review of the MIP images confirms the above findings CTA NECK FINDINGS Aortic arch: Standard branching. Imaged portion shows no evidence of aneurysm or dissection. No significant stenosis of the major arch vessel origins. Atherosclerotic calcifications involving the aortic arch and great vessel origins. Right carotid system: No evidence of aneurysm, dissection, stenosis (50% or greater) or occlusion. Patent proximal right internal carotid artery stent. Left carotid system: Carotid bifurcation calcified and noncalcified atheromatous plaque with approximately 50% luminal narrowing of the proximal ICA. No evidence of dissection or aneurysm. Vertebral arteries: Dominant right vertebral artery. No aneurysm or dissection. No high-grade narrowing. Skeleton: Multilevel spondylosis. No acute osseous abnormality. Postprocedural appearance of the mandibular symphysis. Other neck: No adenopathy.  No soft tissue mass. Upper chest: Emphysema. Spiculated 0.9 x 0.9 cm dorsal left upper lung nodular opacity (504:16). Smaller spiculated opacities are seen within the right upper lung (504:13, 20). Review of the MIP images confirms the above findings CTA HEAD FINDINGS Anterior circulation: No significant stenosis, proximal occlusion, aneurysm, or vascular malformation. Bilateral carotid siphon atherosclerotic calcifications.  Posterior circulation: No focal high-grade narrowing, aneurysm, or vascular malformation. Left V3 segment atherosclerotic calcifications with asymmetrically diminished opacification of the left V4 segment. Venous sinuses: As permitted by contrast timing, patent. Anatomic variants: None. Review of the MIP images confirms the above findings IMPRESSION: Redemonstration of bilateral frontal hypodense regions, concerning for age indeterminate infarcts. Further evaluation with MRI brain is recommended. Cerebral atrophy and chronic microvascular ischemic changes. Patent right proximal ICA stent. Left carotid bifurcation atheromatous disease with approximately 50% luminal narrowing of the proximal left ICA. Left V4 segment atherosclerotic calcifications. Asymmetrically diminished opacification of the left V4 segment may be secondary to slow flow versus diminutive appearance of the vessel. Bilateral upper lung spiculated nodular opacities measuring up to 0.9 cm. Emphysema. Dedicated CT of the chest is recommended for further evaluation. Electronically Signed   By: Primitivo Gauze M.D.   On: 02/01/2020 14:38   CT Head Wo Contrast  Result Date: 02/01/2020 CLINICAL DATA:  TIA. EXAM: CT HEAD WITHOUT CONTRAST TECHNIQUE: Contiguous axial images were obtained from the base of the skull through the vertex without intravenous contrast. COMPARISON:  None. FINDINGS: Brain: Right frontal hypodensity with associated punctate hyperdense foci. A smaller focal hypodensity extending to the cortex involving the inferior left frontal lobe. Background scattered and confluent hypodense foci  are nonspecific however commonly associated with chronic microvascular ischemic changes. Tiny left basal ganglia remote infarct versus dilated perivascular space. No midline shift, mass lesion or extra-axial fluid collection. No ventriculomegaly. Vascular: No hyperdense vessel. Bilateral carotid siphon atherosclerotic calcifications. Skull: No acute  osseous abnormality. Sequela of prior left frontal craniotomy for aneurysm clip ligation. Sinuses/Orbits: Normal orbits. Clear paranasal sinuses. No mastoid effusion. Other: None. IMPRESSION: Age indeterminate bilateral MCA territory hypodensities. Curvilinear and tiny hyperdense right frontal foci may reflect cortical laminar necrosis versus minimal subarachnoid hemorrhage. Further evaluation with MRI is recommended. Remote tiny left thalamic infarct versus dilated perivascular space. Mild cerebral atrophy and chronic microvascular ischemic change. These results were called by telephone at the time of interpretation on 02/01/2020 at 12:20 pm to provider Lenise Arena , who verbally acknowledged these results. Electronically Signed   By: Primitivo Gauze M.D.   On: 02/01/2020 12:23   CT ANGIO NECK W OR WO CONTRAST  Result Date: 02/01/2020 CLINICAL DATA:  Suspected stroke. EXAM: CT ANGIOGRAPHY HEAD AND NECK TECHNIQUE: Multidetector CT imaging of the head and neck was performed using the standard protocol during bolus administration of intravenous contrast. Multiplanar CT image reconstructions and MIPs were obtained to evaluate the vascular anatomy. Carotid stenosis measurements (when applicable) are obtained utilizing NASCET criteria, using the distal internal carotid diameter as the denominator. CONTRAST:  24mL OMNIPAQUE IOHEXOL 350 MG/ML SOLN COMPARISON:  Prior same day noncontrast head CT. FINDINGS: CT HEAD FINDINGS Brain: Redemonstration right greater than left bilateral frontal hypodense regions. The presence of contrast limits evaluation of right frontal hyperdensities seen on prior noncontrast exam. Mild cerebral atrophy and chronic microvascular ischemic changes. Small remote left thalamic infarct. No midline shift, mass lesion or extra-axial fluid collection. Skull: No acute osseous abnormality. Sequela of left frontal craniotomy for aneurysm clipping. Sinuses: Clear paranasal sinuses.  No mastoid  effusion. Orbits: No acute abnormalities. Review of the MIP images confirms the above findings CTA NECK FINDINGS Aortic arch: Standard branching. Imaged portion shows no evidence of aneurysm or dissection. No significant stenosis of the major arch vessel origins. Atherosclerotic calcifications involving the aortic arch and great vessel origins. Right carotid system: No evidence of aneurysm, dissection, stenosis (50% or greater) or occlusion. Patent proximal right internal carotid artery stent. Left carotid system: Carotid bifurcation calcified and noncalcified atheromatous plaque with approximately 50% luminal narrowing of the proximal ICA. No evidence of dissection or aneurysm. Vertebral arteries: Dominant right vertebral artery. No aneurysm or dissection. No high-grade narrowing. Skeleton: Multilevel spondylosis. No acute osseous abnormality. Postprocedural appearance of the mandibular symphysis. Other neck: No adenopathy.  No soft tissue mass. Upper chest: Emphysema. Spiculated 0.9 x 0.9 cm dorsal left upper lung nodular opacity (504:16). Smaller spiculated opacities are seen within the right upper lung (504:13, 20). Review of the MIP images confirms the above findings CTA HEAD FINDINGS Anterior circulation: No significant stenosis, proximal occlusion, aneurysm, or vascular malformation. Bilateral carotid siphon atherosclerotic calcifications. Posterior circulation: No focal high-grade narrowing, aneurysm, or vascular malformation. Left V3 segment atherosclerotic calcifications with asymmetrically diminished opacification of the left V4 segment. Venous sinuses: As permitted by contrast timing, patent. Anatomic variants: None. Review of the MIP images confirms the above findings IMPRESSION: Redemonstration of bilateral frontal hypodense regions, concerning for age indeterminate infarcts. Further evaluation with MRI brain is recommended. Cerebral atrophy and chronic microvascular ischemic changes. Patent right  proximal ICA stent. Left carotid bifurcation atheromatous disease with approximately 50% luminal narrowing of the proximal left ICA. Left V4 segment atherosclerotic calcifications. Asymmetrically  diminished opacification of the left V4 segment may be secondary to slow flow versus diminutive appearance of the vessel. Bilateral upper lung spiculated nodular opacities measuring up to 0.9 cm. Emphysema. Dedicated CT of the chest is recommended for further evaluation. Electronically Signed   By: Primitivo Gauze M.D.   On: 02/01/2020 14:38    EKG: Independently reviewed.   Assessment/Plan Principal Problem:   Weakness of lower extremity Active Problems:   COPD (chronic obstructive pulmonary disease) ([facility])   Chronic systolic CHF (congestive heart failure) (HCC)   Depression   GERD (gastroesophageal reflux disease)   A-fib (HCC)   CVA (cerebral vascular accident) ([place])   CAD (coronary artery disease), native coronary artery   Carotid artery stenosis   HLD (hyperlipidemia)    Bilateral lower extremity weakness Patient presenting with progressive weakness with acute onset inability to ambulate lasting 1 hour today.  Concern for CVA, CT head notable for age-indeterminate bilateral MCA territory hypodensity secondary to cortical laminar necrosis versus minimal subarachnoid hemorrhage.  Patient was seen by neurology initially who believes this is all chronic finding secondary to previous clipping from brain aneurysm. --CT angiogram head/neck pending --Check TSH --Hold aspirin, anticoagulants until angiogram completed --PT/OT evaluation  Chronic systolic congestive heart failure, appears compensated Review of care everywhere, TTE 06/08/2017 in [place] notable for EF 30-35%.  Previously seen cardiology in [place], Dr. Beverlyn Roux. --We will update echocardiogram --Check BMP, chest x-ray with some cardiac wheezing noted on physical exam --Continue home Entresto 49-51 mg p.o. twice daily --Continue  home carvedilol 12.5 mg p.o. twice daily --Furosemide 20 mg p.o. daily --Strict I's and O's and daily weights  COPD w/o oxygen dependency, not in acute exacerbation --Continue home trelegy Ellipta --Duo nebs, albuterol neb as needed for wheezing/shortness of breath  Paroxysmal atrial fibrillation --Continue carvedilol 12.5 mg p.o. twice daily --Hold home Eliquis until CT angiogram complete --Continue to monitor on telemetry  History of brain aneurysm status post clipping History of CVA --Check hemoglobin A1c and lipid panel --Hold aspirin as above --Continue statin  Carotid artery disease History of previous carotid endarterectomy per spouse. --Holding aspirin as above --Continue statin  HLD: Continue Lipitor 40 mg p.o. daily  BPH: Restart tamsulosin 0.4 mg p.o. daily  Depression: Continue Lexapro 10 mg p.o. daily   DVT prophylaxis: SCDs Code Status: Full code Family Communication: Discussed plan of care with patient spouse present at bedside Disposition Plan: Pending therapy evaluation, anticipate discharge home Consults called: Neurology, Dr. Adair Patter, transition of care for assistance with establishment of PCP in cardiology Admission status: Observation     Brendolyn Stockley J [place] DO [facility] Available via Epic secure chat 7am-7pm After these hours, please refer to coverage provider listed on [email or web address] 02/01/2020, 2:47 PM

## 2020-02-01 NOTE — ED Triage Notes (Addendum)
Here for episode of leg weakness this AM.  At 430 AM pt was awake watching tv and got up and was standing but felt like could not move either leg.  No arm weakness.  Difficulty to move legs was bilateral.  Reports lasted 1 hr and when wife brought cane was able to sit down.  Pt denies any speech or mentation difficulties.  On eliquis. Does have neuro hx.

## 2020-02-01 NOTE — ED Triage Notes (Signed)
Pt sent from MD office to rule out TIA. Pt ambulatory in no acute distress

## 2020-02-02 ENCOUNTER — Observation Stay: Payer: Medicare Other

## 2020-02-02 ENCOUNTER — Encounter: Payer: Self-pay | Admitting: Internal Medicine

## 2020-02-02 DIAGNOSIS — I480 Paroxysmal atrial fibrillation: Secondary | ICD-10-CM | POA: Diagnosis not present

## 2020-02-02 DIAGNOSIS — I6529 Occlusion and stenosis of unspecified carotid artery: Secondary | ICD-10-CM | POA: Diagnosis not present

## 2020-02-02 DIAGNOSIS — E785 Hyperlipidemia, unspecified: Secondary | ICD-10-CM | POA: Diagnosis not present

## 2020-02-02 DIAGNOSIS — F329 Major depressive disorder, single episode, unspecified: Secondary | ICD-10-CM | POA: Diagnosis not present

## 2020-02-02 LAB — ECHOCARDIOGRAM COMPLETE
Area-P 1/2: 3.97 cm2
Height: 70 in
S' Lateral: 3.08 cm
Weight: 3296 oz

## 2020-02-02 LAB — BASIC METABOLIC PANEL
Anion gap: 8 (ref 5–15)
BUN: 16 mg/dL (ref 8–23)
CO2: 25 mmol/L (ref 22–32)
Calcium: 8.7 mg/dL — ABNORMAL LOW (ref 8.9–10.3)
Chloride: 107 mmol/L (ref 98–111)
Creatinine, Ser: 1.01 mg/dL (ref 0.61–1.24)
GFR calc Af Amer: >60 mL/min (ref >60)
GFR calc non Af Amer: >60 mL/min (ref >60)
Glucose, Bld: 99 mg/dL (ref 70–99)
Potassium: 3.8 mmol/L (ref 3.5–5.1)
Sodium: 140 mmol/L (ref 135–145)

## 2020-02-02 LAB — HEMOGLOBIN A1C
Hgb A1c MFr Bld: 5.7 % — ABNORMAL HIGH (ref 4.8–5.6)
Mean Plasma Glucose: 116.89 mg/dL

## 2020-02-02 LAB — HIV ANTIBODY (ROUTINE TESTING W REFLEX): HIV Screen 4th Generation wRfx: NONREACTIVE

## 2020-02-02 MED ORDER — TAMSULOSIN HCL 0.4 MG PO CAPS: 0.4000 mg | ORAL_CAPSULE | Freq: Every day | ORAL | 0 refills | Status: DC

## 2020-02-02 MED ORDER — ATORVASTATIN CALCIUM 40 MG PO TABS: 40.0000 mg | ORAL_TABLET | Freq: Every day | ORAL | 0 refills | Status: DC

## 2020-02-02 MED ORDER — IOHEXOL 300 MG/ML  SOLN
75.0000 mL | Freq: Once | INTRAMUSCULAR | Status: AC | PRN
  Administered 2020-02-02: 75 mL via INTRAVENOUS

## 2020-02-02 NOTE — Evaluation (Signed)
Occupational Therapy Evaluation Patient Details Name: Richard Wiggins MRN: [id number] DOB: 29-Nov-1950 Today's Date: 02/02/2020    History of Present Illness Patient is a 69 year old male who presented to ED with progressive BLE weakness. Reports progressive weakness since stroke, was unable to completely move legs earlier for ~1 hour.  PMH includes CHF, brain aneurysm s/p clipping, COPD, HTN, paroxysmal A fib on eliquis, CAD s/p PCI, carotid stenosis depression, HLD, BPH, GERD, CVA, HLD.  Patient recently moved to [facility] from [place] and closed on a house last Wednesday.   Clinical Impression   Richard Wiggins presents to OT with impaired balance and cognition that impacts his ability to safely and independently complete functional tasks.  Prior to admission, pt was mod I in all ADLs and IADLs using SPC.  Currently, he requires supervision/CGA for ADLs involving functional mobility 2/2 slightly impaired balance.  Pt has decreased sensation in LUE 2/2 CVA hx.  OTR educated pt on compensatory strategies for decreased sensation, pt verbalized understanding.  Pt's wife reports concerns of pt's cognition, stating he has had trouble with forgetfulness and motor planning.  OTR educated pt and pt's wife on cognitive compensatory strategies for home safety.  OTR did not observe any large cognitive deficits, but pt will likely benefit from further formal cognitive assessments.  Richard Wiggins will continue to benefit from skilled OT services in acute setting to address balance, cognition, safety, and independence in ADLs.  Recommend HHOT upon discharge.    Follow Up Recommendations  Home health OT    Equipment Recommendations  None recommended by OT    Recommendations for Other Services       Precautions / Restrictions Precautions Precautions: None Restrictions Weight Bearing Restrictions: No      Mobility Bed Mobility Overal bed mobility: Independent             General bed mobility comments:  supine <>sit without UE support, HOB elevated  Transfers Overall transfer level: Needs assistance Equipment used: Straight cane Transfers: Sit to/from Stand Sit to Stand: Min guard;Supervision         General transfer comment: Patient performs STS with CGA/supervision with SPC    Balance Overall balance assessment: Needs assistance Sitting-balance support: No upper extremity supported;Feet unsupported Sitting balance-Leahy Scale: Good Sitting balance - Comments: able to reach without LOB, slight left tilt preference   Standing balance support: Single extremity supported;During functional activity Standing balance-Leahy Scale: Fair Standing balance comment: Requires use of SUE support for dynamic mobility. able to static stand w/o UE support.               High Level Balance Comments: instability with head turns.           ADL either performed or assessed with clinical judgement   ADL Overall ADL's : Needs assistance/impaired Eating/Feeding: Set up;Sitting   Grooming: Oral care;Wash/dry face;Wash/dry hands;Standing;Min guard Grooming Details (indicate cue type and reason): OTR provided min guard for pt to complete standing grooming tasks at sink.  Pt with use of UE support on counter when able.   Upper Body Bathing Details (indicate cue type and reason): not assessed, suspect supervision assist Lower Body Bathing: Supervison/ safety;Sit to/from stand Lower Body Bathing Details (indicate cue type and reason): Pt able to reach feet. Upper Body Dressing : Set up;Sitting   Lower Body Dressing: Min guard;Sit to/from stand Lower Body Dressing Details (indicate cue type and reason): OTR provided supervision assist for pt to don/doff socks seated EOB.  Pt requiring min  guard assist while standing to pull pants over hips. Toilet Transfer: Supervision/safety;Ambulation;Regular Toilet (with [place])   Toileting- Clothing Manipulation and Hygiene: Data processing manager Details (indicate cue type and reason): not assessed Functional mobility during ADLs: Min guard;Cane       Vision Baseline Vision/History: Wears glasses Wears Glasses: At all times Patient Visual Report: No change from baseline Vision Assessment?: Yes Eye Alignment: Within Functional Limits Ocular Range of Motion: Within Functional Limits Alignment/Gaze Preference: Within Defined Limits Tracking/Visual Pursuits: Able to track stimulus in all quads without difficulty Saccades: Within functional limits Convergence: Within functional limits Visual Fields: No apparent deficits     Perception     Praxis Praxis Praxis-Other Comments: No deficits observed and not formally assessed, but may benefit from futher evaluation as pt's wife reports he has trouble with initiation and motor planning.    Pertinent Vitals/Pain Pain Assessment: No/denies pain     Hand Dominance     Extremity/Trunk Assessment Upper Extremity Assessment Upper Extremity Assessment: LUE deficits/detail;Overall [facility] for tasks assessed LUE Deficits / Details: decreased sensation s/p R MCA CVA in 2018 LUE Sensation: decreased light touch LUE Coordination: WNL   Lower Extremity Assessment Lower Extremity Assessment: Defer to PT evaluation RLE Deficits / Details: grossly 4-/5 RLE Sensation: decreased proprioception RLE Coordination: WNL LLE Deficits / Details: grossly 4+/5 LLE Sensation: WNL LLE Coordination: WNL       Communication Communication Communication: No difficulties   Cognition Arousal/Alertness: Awake/alert Behavior During Therapy: WFL for tasks assessed/performed Overall Cognitive Status: Within Functional Limits for tasks assessed                                 General Comments: Patient is pleasant and oriented x 4.  Pt's wife reports pt has difficulty with praxis and some memory deficits, so would benefit from further assessment.   General Comments   Patient appears well nourished and groomed.    Exercises General Exercises - Lower Extremity Ankle Circles/Pumps: Strengthening;Both;15 reps;Seated Other Exercises Other Exercises: Patient educated on role of PT in acute care setting, safe mobility and transfers. Ambulation with SPC as well as bathroom mobility. Other Exercises: provided education re: OT role and plan of care, fall and safety precautions, self care, functional mobility, compensatory strategies for cognitive deficits.  Provided CGA for functional mobility, toileting, and standing grooming tasks.   Shoulder Instructions      Home Living Family/patient expects to be discharged to:: Private residence Living Arrangements: Spouse/significant other Available Help at Discharge: Family Type of Home: House Home Access: Level entry     Home Layout: Two level;Able to live on main level with bedroom/bathroom     Bathroom Shower/Tub: Occupational psychologist: Standard     Home Equipment: Cane - single point;Shower seat   Additional Comments: Patient recently closed on new house since moving to [place], was in a long stay Air[employer]nb. Walks with a cane at baseline      Prior Functioning/Environment Level of Independence: Independent with assistive device(s)        Comments: Patient walks with a cane at baseline since his stroke. Is ind with mobility and ADLs with AD.  He currently drives.  Pt is retired and spends his days watching TV and walking his dogs.  Pt reports his wife helps with medication management. Pt's wife reports some forgetfulness and difficulties with cognition recently.  OT Problem List: Decreased strength;Decreased range of motion;Decreased activity tolerance;Impaired balance (sitting and/or standing);Decreased coordination;Decreased cognition;Decreased safety awareness;Decreased knowledge of use of DME or AE;Impaired sensation      OT Treatment/Interventions: Self-care/ADL training;Therapeutic  exercise;Neuromuscular education;Energy conservation;DME and/or AE instruction;Therapeutic activities;Cognitive remediation/compensation;Patient/family education;Balance training    OT Goals(Current goals can be found in the care plan section) Acute Rehab OT Goals Patient Stated Goal: to return home OT Goal Formulation: With patient/family Time For Goal Achievement: 02/16/20 Potential to Achieve Goals: Good  OT Frequency: Min 1X/week   Barriers to D/C:            Co-evaluation              AM-PAC OT "6 Clicks" Daily Activity     Outcome Measure Help from another person eating meals?: None Help from another person taking care of personal grooming?: None Help from another person toileting, which includes using toliet, bedpan, or urinal?: A Little Help from another person bathing (including washing, rinsing, drying)?: A Little Help from another person to put on and taking off regular upper body clothing?: None Help from another person to put on and taking off regular lower body clothing?: A Little 6 Click Score: 21   End of Session Equipment Utilized During Treatment: Gait belt  Activity Tolerance: Patient tolerated treatment well Patient left: in bed;with call bell/phone within reach;with bed alarm set  OT Visit Diagnosis: Other abnormalities of gait and mobility (R26.89)                Time: 0940-1000 OT Time Calculation (min): 20 min Charges:  OT General Charges $OT Visit: 1 Visit OT Evaluation $OT Eval Moderate Complexity: 1 Mod OT Treatments $Self Care/Home Management : 8-22 mins  Myrtie Hawk Kerrion Kemppainen, OTR/L 02/02/20, 10:49 AM

## 2020-02-02 NOTE — Discharge Summary (Signed)
Richard Wiggins LTJ:[id number] DOB: 1950-08-11 DOA: 02/01/2020  PCP: Patient, No Pcp Per  Admit date: 02/01/2020 Discharge date: 02/02/2020  Admitted From: Home Disposition: Home  Recommendations for Outpatient Follow-up:  1. Follow up with PCP in 1 week 2. Please obtain BMP/CBC in one week 3. Dr. Janese Banks in 1 week oncology 4. Dr. Manuella Ghazi neurology in 1 week 5. Dr. Rockey Situ cardiology in 1 to 2 weeks  Home Health: PT OT   Discharge Condition:Stable CODE STATUS: Full Diet recommendation: Heart Healthy Brief/Interim Summary: Richard Wiggins is a 69 y.o. male with medical history significant of chronic systolic congestive heart failure, history of brain aneurysm status post clipping, COPD, essential hypertension, paroxysmal atrial fibrillation on Eliquis, CAD status post PCI, carotid stenosis, depression, HLD, and BPH who presented to ER with progressive bilateral lower extremity weakness.  This patient reports progressive weakness since his stroke but was unable to completely move his legs earlier this last afternoon lasting approximately 1 hour.  Given his significant history including stroke, patient spouse was concerned about recurrence in which she proceeded to bring him to the emergency department.  Patient states he recently moved from [place] to the area and has yet to establish care with PCP or specialist.  He reports compliance with the majority of his medications except he has been out of his home tamsulosin and he rarely uses his furosemide.. CT head with age-indeterminate bilateral MCA territory hypodensities secondary to cortical laminar necrosis versus minimal subarachnoid hemorrhage. ED physician consulted neurology for evaluation given his bilateral lower extremity weakness coupled with CT head findings.  He was ruled out for stroke.  Bilateral lower extremity weakness-stroke ruled out Resolved. Ambulated this a.m. with PT OT and they recommended home health Neurology following  recommended CT angio of the head and neck with posterior circulation occlusion or ACA stenosis occlusion Neurology does not think patient had a stroke Recommend neurology follow-up as outpatient Continue aspirin and Eliquis   Chronic systolic congestive heart failure, appears compensated Review of care everywhere, TTE 06/08/2017 in [place] notable for EF 30-35%.  Previously seen cardiology in [place], Dr. Beverlyn Roux. --We will update echocardiogram Currently euvolemic and without exacerbation Repeat echo today revealed EF of 60 to 65% We will have him follow-up with Dr. Rockey Situ as outpatient to get established with a cardiologist Given refills on his statin Continue outpatient medication  Spiculated nodule incidental finding on initial CT angiogram CT of the chest today revealed spiculated nodule of the posterior left upper lobe which is concerning for primary lung malignancy recommended PET and CT. I spoke to Dr. Janese Banks oncologist she will set up the follow-up scans and follow-up with her as outpatient they will call the patient. I did update the wife about the CT findings and that Dr. Janese Banks will be calling her for further evaluation and management.  She verbalizes an understanding   COPD w/o oxygen dependency, not in acute exacerbation --Continue home trelegy Ellipta and other inhalers   Paroxysmal atrial fibrillation --In normal sinus sinus  Continue home carvedilol 12.5 mg p.o. twice daily Continue Eliquis  History of brain aneurysm status post clipping History of CVA A1c is 5.7 LDL is 101, goal is less than 70 --Continue statin, given prescription  Carotid artery disease History of previous carotid endarterectomy per spouse. Continue aspirin --Continue statin     Discharge Diagnoses:  Principal Problem:   Weakness of lower extremity Active Problems:   COPD (chronic obstructive pulmonary disease) (HCC)   Chronic systolic CHF (congestive heart failure) ([place])  Depression   GERD (gastroesophageal reflux disease)   A-fib (HCC)   CVA (cerebral vascular accident) ([place])   CAD (coronary artery disease), native coronary artery   Carotid artery stenosis   HLD (hyperlipidemia)    Discharge Instructions  Discharge Instructions    Call MD for:  difficulty breathing, headache or visual disturbances   Complete by: As directed    Call MD for:  severe uncontrolled pain   Complete by: As directed    Diet - low sodium heart healthy   Complete by: As directed    Discharge instructions   Complete by: As directed    Try to set up with a primary care within 1 week Follow-up with Dr. Randa Evens oncology Follow-up with Dr. Rockey Situ cardiology Follow-up with Dr. Clair Gulling neurology   Increase activity slowly   Complete by: As directed      Allergies as of 02/02/2020   No Known Allergies     Medication List    TAKE these medications   albuterol 108 (90 Base) MCG/ACT inhaler Commonly known as: VENTOLIN HFA Inhale 2 puffs into the lungs every 4 (four) hours as needed.   apixaban 5 MG Tabs tablet Commonly known as: ELIQUIS Take 5 mg by mouth in the morning and at bedtime.   aspirin EC 81 MG tablet Take 81 mg by mouth daily. Swallow whole.   atorvastatin 40 MG tablet Commonly known as: LIPITOR Take 1 tablet (40 mg total) by mouth at bedtime.   carvedilol 25 MG tablet Commonly known as: COREG Take 12.5 mg by mouth in the morning and at bedtime.   furosemide 20 MG tablet Commonly known as: LASIX Take 1-2 tablets by mouth daily as needed for fluid.   ipratropium-albuterol 0.5-2.5 (3) MG/3ML Soln Commonly known as: DUONEB Inhale 3 mLs into the lungs every 6 (six) hours as needed.   sacubitril-valsartan 49-51 MG Commonly known as: ENTRESTO Take 0.5 tablets by mouth in the morning and at bedtime.   spironolactone 25 MG tablet Commonly known as: ALDACTONE Take 12.5 mg by mouth at bedtime.   tamsulosin 0.4 MG Caps capsule Commonly known  as: FLOMAX Take 1 capsule (0.4 mg total) by mouth daily. Start taking on: February 03, 2020   Trelegy Ellipta 100-62.5-25 MCG/INH Aepb Generic drug: Fluticasone-Umeclidin-Vilant Inhale 1 puff into the lungs daily.       Follow-up Information    Vladimir Crofts, MD Follow up in 1 week(s).   Specialty: Neurology Contact information: [facility] [place] [id number] [phone]        Minna Merritts, MD Follow up in 1 week(s).   Specialty: Cardiology Contact information: [place] [id number] [phone]        Sindy Guadeloupe, MD Follow up in 1 week(s).   Specialty: Oncology Why: needs f/u abnormal CT, and pet scan Contact information: [place] [id number] [phone]              No Known Allergies  Consultations:  Neurology   Procedures/Studies: CT ANGIO HEAD W OR WO CONTRAST  Result Date: 02/01/2020 CLINICAL DATA:  Suspected stroke. EXAM: CT ANGIOGRAPHY HEAD AND NECK TECHNIQUE: Multidetector CT imaging of the head and neck was performed using the standard protocol during bolus administration of intravenous contrast. Multiplanar CT image reconstructions and MIPs were obtained to evaluate the vascular anatomy. Carotid stenosis measurements (when applicable) are obtained utilizing NASCET criteria, using the distal internal  carotid diameter as the denominator. CONTRAST:  38mL OMNIPAQUE IOHEXOL 350 MG/ML SOLN COMPARISON:  Prior same day noncontrast head CT. FINDINGS: CT HEAD FINDINGS Brain: Redemonstration right greater than left bilateral frontal hypodense regions. The presence of contrast limits evaluation of right frontal hyperdensities seen on prior noncontrast exam. Mild cerebral atrophy and chronic microvascular ischemic changes. Small remote left thalamic infarct. No midline shift, mass lesion or extra-axial fluid collection. Skull: No acute osseous abnormality. Sequela  of left frontal craniotomy for aneurysm clipping. Sinuses: Clear paranasal sinuses.  No mastoid effusion. Orbits: No acute abnormalities. Review of the MIP images confirms the above findings CTA NECK FINDINGS Aortic arch: Standard branching. Imaged portion shows no evidence of aneurysm or dissection. No significant stenosis of the major arch vessel origins. Atherosclerotic calcifications involving the aortic arch and great vessel origins. Right carotid system: No evidence of aneurysm, dissection, stenosis (50% or greater) or occlusion. Patent proximal right internal carotid artery stent. Left carotid system: Carotid bifurcation calcified and noncalcified atheromatous plaque with approximately 50% luminal narrowing of the proximal ICA. No evidence of dissection or aneurysm. Vertebral arteries: Dominant right vertebral artery. No aneurysm or dissection. No high-grade narrowing. Skeleton: Multilevel spondylosis. No acute osseous abnormality. Postprocedural appearance of the mandibular symphysis. Other neck: No adenopathy.  No soft tissue mass. Upper chest: Emphysema. Spiculated 0.9 x 0.9 cm dorsal left upper lung nodular opacity (504:16). Smaller spiculated opacities are seen within the right upper lung (504:13, 20). Review of the MIP images confirms the above findings CTA HEAD FINDINGS Anterior circulation: No significant stenosis, proximal occlusion, aneurysm, or vascular malformation. Bilateral carotid siphon atherosclerotic calcifications. Posterior circulation: No focal high-grade narrowing, aneurysm, or vascular malformation. Left V3 segment atherosclerotic calcifications with asymmetrically diminished opacification of the left V4 segment. Venous sinuses: As permitted by contrast timing, patent. Anatomic variants: None. Review of the MIP images confirms the above findings IMPRESSION: Redemonstration of bilateral frontal hypodense regions, concerning for age indeterminate infarcts. Further evaluation with MRI  brain is recommended. Cerebral atrophy and chronic microvascular ischemic changes. Patent right proximal ICA stent. Left carotid bifurcation atheromatous disease with approximately 50% luminal narrowing of the proximal left ICA. Left V4 segment atherosclerotic calcifications. Asymmetrically diminished opacification of the left V4 segment may be secondary to slow flow versus diminutive appearance of the vessel. Bilateral upper lung spiculated nodular opacities measuring up to 0.9 cm. Emphysema. Dedicated CT of the chest is recommended for further evaluation. Electronically Signed   By: Primitivo Gauze M.D.   On: 02/01/2020 14:38   CT Head Wo Contrast  Result Date: 02/01/2020 CLINICAL DATA:  TIA. EXAM: CT HEAD WITHOUT CONTRAST TECHNIQUE: Contiguous axial images were obtained from the base of the skull through the vertex without intravenous contrast. COMPARISON:  None. FINDINGS: Brain: Right frontal hypodensity with associated punctate hyperdense foci. A smaller focal hypodensity extending to the cortex involving the inferior left frontal lobe. Background scattered and confluent hypodense foci are nonspecific however commonly associated with chronic microvascular ischemic changes. Tiny left basal ganglia remote infarct versus dilated perivascular space. No midline shift, mass lesion or extra-axial fluid collection. No ventriculomegaly. Vascular: No hyperdense vessel. Bilateral carotid siphon atherosclerotic calcifications. Skull: No acute osseous abnormality. Sequela of prior left frontal craniotomy for aneurysm clip ligation. Sinuses/Orbits: Normal orbits. Clear paranasal sinuses. No mastoid effusion. Other: None. IMPRESSION: Age indeterminate bilateral MCA territory hypodensities. Curvilinear and tiny hyperdense right frontal foci may reflect cortical laminar necrosis versus minimal subarachnoid hemorrhage. Further evaluation with MRI is recommended. Remote tiny left thalamic infarct  versus dilated  perivascular space. Mild cerebral atrophy and chronic microvascular ischemic change. These results were called by telephone at the time of interpretation on 02/01/2020 at 12:20 pm to provider Lenise Arena , who verbally acknowledged these results. Electronically Signed   By: Primitivo Gauze M.D.   On: 02/01/2020 12:23   CT ANGIO NECK W OR WO CONTRAST  Result Date: 02/01/2020 CLINICAL DATA:  Suspected stroke. EXAM: CT ANGIOGRAPHY HEAD AND NECK TECHNIQUE: Multidetector CT imaging of the head and neck was performed using the standard protocol during bolus administration of intravenous contrast. Multiplanar CT image reconstructions and MIPs were obtained to evaluate the vascular anatomy. Carotid stenosis measurements (when applicable) are obtained utilizing NASCET criteria, using the distal internal carotid diameter as the denominator. CONTRAST:  69mL OMNIPAQUE IOHEXOL 350 MG/ML SOLN COMPARISON:  Prior same day noncontrast head CT. FINDINGS: CT HEAD FINDINGS Brain: Redemonstration right greater than left bilateral frontal hypodense regions. The presence of contrast limits evaluation of right frontal hyperdensities seen on prior noncontrast exam. Mild cerebral atrophy and chronic microvascular ischemic changes. Small remote left thalamic infarct. No midline shift, mass lesion or extra-axial fluid collection. Skull: No acute osseous abnormality. Sequela of left frontal craniotomy for aneurysm clipping. Sinuses: Clear paranasal sinuses.  No mastoid effusion. Orbits: No acute abnormalities. Review of the MIP images confirms the above findings CTA NECK FINDINGS Aortic arch: Standard branching. Imaged portion shows no evidence of aneurysm or dissection. No significant stenosis of the major arch vessel origins. Atherosclerotic calcifications involving the aortic arch and great vessel origins. Right carotid system: No evidence of aneurysm, dissection, stenosis (50% or greater) or occlusion. Patent proximal right  internal carotid artery stent. Left carotid system: Carotid bifurcation calcified and noncalcified atheromatous plaque with approximately 50% luminal narrowing of the proximal ICA. No evidence of dissection or aneurysm. Vertebral arteries: Dominant right vertebral artery. No aneurysm or dissection. No high-grade narrowing. Skeleton: Multilevel spondylosis. No acute osseous abnormality. Postprocedural appearance of the mandibular symphysis. Other neck: No adenopathy.  No soft tissue mass. Upper chest: Emphysema. Spiculated 0.9 x 0.9 cm dorsal left upper lung nodular opacity (504:16). Smaller spiculated opacities are seen within the right upper lung (504:13, 20). Review of the MIP images confirms the above findings CTA HEAD FINDINGS Anterior circulation: No significant stenosis, proximal occlusion, aneurysm, or vascular malformation. Bilateral carotid siphon atherosclerotic calcifications. Posterior circulation: No focal high-grade narrowing, aneurysm, or vascular malformation. Left V3 segment atherosclerotic calcifications with asymmetrically diminished opacification of the left V4 segment. Venous sinuses: As permitted by contrast timing, patent. Anatomic variants: None. Review of the MIP images confirms the above findings IMPRESSION: Redemonstration of bilateral frontal hypodense regions, concerning for age indeterminate infarcts. Further evaluation with MRI brain is recommended. Cerebral atrophy and chronic microvascular ischemic changes. Patent right proximal ICA stent. Left carotid bifurcation atheromatous disease with approximately 50% luminal narrowing of the proximal left ICA. Left V4 segment atherosclerotic calcifications. Asymmetrically diminished opacification of the left V4 segment may be secondary to slow flow versus diminutive appearance of the vessel. Bilateral upper lung spiculated nodular opacities measuring up to 0.9 cm. Emphysema. Dedicated CT of the chest is recommended for further evaluation.  Electronically Signed   By: Primitivo Gauze M.D.   On: 02/01/2020 14:38   CT CHEST W CONTRAST  Result Date: 02/02/2020 CLINICAL DATA:  History of lymphoma, spiculated pulmonary nodule identified on prior CT neck angiogram EXAM: CT CHEST WITH CONTRAST TECHNIQUE: Multidetector CT imaging of the chest was performed during intravenous contrast administration. CONTRAST:  18mL OMNIPAQUE IOHEXOL 300 MG/ML  SOLN COMPARISON:  CT head and neck angiogram, 02/01/2020 FINDINGS: Cardiovascular: Aortic atherosclerosis. Normal heart size. Three-vessel coronary artery calcifications. No pericardial effusion. Mediastinum/Nodes: No enlarged mediastinal, hilar, or axillary lymph nodes. Thyroid gland, trachea, and esophagus demonstrate no significant findings. Lungs/Pleura: Mild-to-moderate centrilobular emphysema. Mild diffuse bilateral bronchial wall thickening. There is a spiculated nodule of the posterior left upper lobe, abutting and tenting the major fissure, measuring 1.2 x 1.1 cm (series 3, image 60). Small spiculated nodule of the posterior right upper lobe measuring 5 mm (series 3, CIS. Rounded pulmonary nodule of the right lower lobe measuring 7 mm (series 3, image 117). Mild scarring of the dependent bilateral lung bases. Background of tiny centrilobular pulmonary nodules most numerous in the lung apices. No pleural effusion or pneumothorax. Upper Abdomen: No acute abnormality. Musculoskeletal: No chest wall mass or suspicious bone lesions identified. IMPRESSION: 1. There is a spiculated nodule of the posterior left upper lobe, abutting and tenting the major fissure, measuring 1.2 x 1.1 cm. This finding is concerning for primary lung malignancy. Consider tissue sampling and/or PET-CT for metabolic characterization. 2. Additional small nodules, a spiculated nodule of the posterior right upper lobe measuring 5 mm, and a pulmonary nodule of the right lower lobe measuring 7 mm. These are nonspecific, likely too small to  characterize by FDG PET. Attention on follow-up. 3. Background of tiny centrilobular pulmonary nodules most numerous in the lung apices, most commonly seen in respiratory bronchiolitis. 4. Emphysema and diffuse bilateral bronchial wall thickening. Emphysema (ICD10-J43.9). 5. Coronary artery disease.  Aortic Atherosclerosis (ICD10-I70.0). Electronically Signed   By: Eddie Candle M.D.   On: 02/02/2020 11:33   Portable chest 1 View  Result Date: 02/01/2020 CLINICAL DATA:  Dyspnea EXAM: PORTABLE CHEST 1 VIEW COMPARISON:  None. FINDINGS: Vascular stent over the right neck. No focal opacity or pleural effusion. Normal heart size. Aortic atherosclerosis. No pneumothorax. IMPRESSION: No active disease. Electronically Signed   By: Donavan Foil M.D.   On: 02/01/2020 19:05   ECHOCARDIOGRAM COMPLETE  Result Date: 02/02/2020    ECHOCARDIOGRAM REPORT   Patient Name:   Richard Wiggins Date of Exam: 02/01/2020 Medical Rec #:  [id number]        Height:       70.0 in Accession #:    [id number]       Weight:       206.0 lb Date of Birth:  02-19-1951        BSA:          2.114 m Patient Age:    6 years         BP:           170/88 mmHg Patient Gender: M                HR:           66 bpm. Exam Location:  [facility] Procedure: 2D Echo Indications:     Dyspnea 786.09/R06.00  History:         Patient has no prior history of Echocardiogram examinations.                  CHF, COPD and Stroke; Risk Factors:Dyslipidemia.  Sonographer:     Avanell Shackleton Referring Phys:  [id number] ERIC J [place] Diagnosing Phys: Jenkins Rouge MD  Sonographer Comments: Technically difficult study due to poor echo windows. IMPRESSIONS  1. Left ventricular ejection fraction, by estimation, is 60 to 65%. The left ventricle has  normal function. The left ventricle has no regional wall motion abnormalities. There is mild left ventricular hypertrophy. Left ventricular diastolic parameters were normal.  2. Right ventricular systolic function is normal. The right  ventricular size is normal. There is normal pulmonary artery systolic pressure.  3. Left atrial size was mildly dilated.  4. The mitral valve is normal in structure. No evidence of mitral valve regurgitation. No evidence of mitral stenosis.  5. The aortic valve is normal in structure. Aortic valve regurgitation is not visualized. No aortic stenosis is present.  6. The inferior vena cava is normal in size with greater than 50% respiratory variability, suggesting right atrial pressure of 3 mmHg. FINDINGS  Left Ventricle: Left ventricular ejection fraction, by estimation, is 60 to 65%. The left ventricle has normal function. The left ventricle has no regional wall motion abnormalities. The left ventricular internal cavity size was normal in size. There is  mild left ventricular hypertrophy. Left ventricular diastolic parameters were normal. Right Ventricle: The right ventricular size is normal. No increase in right ventricular wall thickness. Right ventricular systolic function is normal. There is normal pulmonary artery systolic pressure. The tricuspid regurgitant velocity is 2.08 m/s, and  with an assumed right atrial pressure of 10 mmHg, the estimated right ventricular systolic pressure is 86.5 mmHg. Left Atrium: Left atrial size was mildly dilated. Right Atrium: Right atrial size was normal in size. Pericardium: There is no evidence of pericardial effusion. Mitral Valve: The mitral valve is normal in structure. Normal mobility of the mitral valve leaflets. No evidence of mitral valve regurgitation. No evidence of mitral valve stenosis. Tricuspid Valve: The tricuspid valve is normal in structure. Tricuspid valve regurgitation is trivial. No evidence of tricuspid stenosis. Aortic Valve: The aortic valve is normal in structure. Aortic valve regurgitation is not visualized. No aortic stenosis is present. Pulmonic Valve: The pulmonic valve was normal in structure. Pulmonic valve regurgitation is not visualized. No  evidence of pulmonic stenosis. Aorta: The aortic root is normal in size and structure. Venous: The inferior vena cava is normal in size with greater than 50% respiratory variability, suggesting right atrial pressure of 3 mmHg. IAS/Shunts: No atrial level shunt detected by color flow Doppler.  LEFT VENTRICLE PLAX 2D LVIDd:         4.28 cm  Diastology LVIDs:         3.08 cm  LV e' lateral:   8.49 cm/s LV PW:         1.20 cm  LV E/e' lateral: 8.6 LV IVS:        1.18 cm  LV e' medial:    6.85 cm/s LVOT diam:     1.80 cm  LV E/e' medial:  10.6 LVOT Area:     2.54 cm  RIGHT VENTRICLE             IVC RV S prime:     13.10 cm/s  IVC diam: 2.21 cm TAPSE (M-mode): 2.9 cm LEFT ATRIUM             Index       RIGHT ATRIUM           Index LA diam:        4.00 cm 1.89 cm/m  RA Area:     13.80 cm LA Vol (A2C):   45.1 ml 21.34 ml/m RA Volume:   35.30 ml  16.70 ml/m LA Vol (A4C):   37.7 ml 17.84 ml/m LA Biplane Vol: 41.8 ml 19.78 ml/m  AORTA Ao Root diam: 3.50 cm MITRAL VALVE               TRICUSPID VALVE MV Area (PHT): 3.97 cm    TR Peak grad:   17.3 mmHg MV Decel Time: 191 msec    TR Vmax:        208.00 cm/s MV E velocity: 72.80 cm/s MV A velocity: 55.70 cm/s  SHUNTS MV E/A ratio:  1.31        Systemic Diam: 1.80 cm Jenkins Rouge MD Electronically signed by Jenkins Rouge MD Signature Date/Time: 02/02/2020/2:22:30 PM    Final        Subjective: Has no complaints.  He was walking with PT and OT was at bedside when he came in.  Has no complaints.  Discharge Exam: Vitals:   02/01/20 2326 02/02/20 0735  BP: (!) 164/72 (!) 163/92  Pulse: 70 66  Resp: 17 15  Temp: 98 F (36.7 C) 98.3 F (36.8 C)  SpO2: 97% 97%   Vitals:   02/01/20 1850 02/01/20 1936 02/01/20 2326 02/02/20 0735  BP: (!) 170/90 (!) 170/88 (!) 164/72 (!) 163/92  Pulse:  68 70 66  Resp:  17 17 15   Temp:  97.8 F (36.6 C) 98 F (36.7 C) 98.3 F (36.8 C)  TempSrc:  Oral Oral Oral  SpO2: 100% 99% 97% 97%  Weight:      Height:         General: Pt is alert, awake, not in acute distress Cardiovascular: RRR, S1/S2 +, no rubs, no gallops Respiratory: CTA bilaterally, no wheezing, no rhonchi Abdominal: Soft, NT, ND, bowel sounds + Extremities: no edema, no cyanosis    The results of significant diagnostics from this hospitalization (including imaging, microbiology, ancillary and laboratory) are listed below for reference.     Microbiology: Recent Results (from the past 240 hour(s))  SARS Coronavirus 2 by RT PCR (hospital order, performed in [facility] hospital lab) Nasopharyngeal Nasopharyngeal Swab     Status: None   Collection Time: 02/01/20  2:47 PM   Specimen: Nasopharyngeal Swab  Result Value Ref Range Status   SARS Coronavirus 2 NEGATIVE NEGATIVE Final    Comment: (NOTE) SARS-CoV-2 target nucleic acids are NOT DETECTED.  The SARS-CoV-2 RNA is generally detectable in upper and lower respiratory specimens during the acute phase of infection. The lowest concentration of SARS-CoV-2 viral copies this assay can detect is 250 copies / mL. A negative result does not preclude SARS-CoV-2 infection and should not be used as the sole basis for treatment or other patient management decisions.  A negative result may occur with improper specimen collection / handling, submission of specimen other than nasopharyngeal swab, presence of viral mutation(s) within the areas targeted by this assay, and inadequate number of viral copies (<250 copies / mL). A negative result must be combined with clinical observations, patient history, and epidemiological information.  Fact Sheet for Patients:   StrictlyIdeas.no  Fact Sheet for Healthcare Providers: BankingDealers.co.za  This test is not yet approved or  cleared by the [place] FDA and has been authorized for detection and/or diagnosis of SARS-CoV-2 by FDA under an Emergency Use Authorization (EUA).  This EUA will  remain in effect (meaning this test can be used) for the duration of the COVID-19 declaration under Section 564(b)(1) of the Act, 21 U.S.C. section 360bbb-3(b)(1), unless the authorization is terminated or revoked sooner.  Performed at [facility], [address], [place], [place] [address]  Labs: BNP (last 3 results) Recent Labs    02/01/20 1838  BNP 517.6*   Basic Metabolic Panel: Recent Labs  Lab 02/01/20 1110 02/01/20 1838 02/02/20 0418  NA 140  --  140  K 4.1  --  3.8  CL 106  --  107  CO2 24  --  25  GLUCOSE 114*  --  99  BUN 22  --  16  CREATININE 1.14  --  1.01  CALCIUM 9.4  --  8.7*  MG  --  1.9  --    Liver Function Tests: Recent Labs  Lab 02/01/20 1110  AST 23  ALT 16  ALKPHOS 91  BILITOT 0.8  PROT 8.3*  ALBUMIN 4.3   No results for input(s): LIPASE, AMYLASE in the last 168 hours. No results for input(s): AMMONIA in the last 168 hours. CBC: Recent Labs  Lab 02/01/20 1110  WBC 4.2  HGB 12.0*  HCT 38.6*  MCV 92.1  PLT 145*   Cardiac Enzymes: No results for input(s): CKTOTAL, CKMB, CKMBINDEX, TROPONINI in the last 168 hours. BNP: Invalid input(s): POCBNP CBG: No results for input(s): GLUCAP in the last 168 hours. D-Dimer No results for input(s): DDIMER in the last 72 hours. Hgb A1c Recent Labs    02/01/20 1838  HGBA1C 5.7*   Lipid Profile Recent Labs    02/01/20 1838  CHOL 166  HDL 49  LDLCALC 101*  TRIG 79  CHOLHDL 3.4   Thyroid function studies Recent Labs    02/01/20 1838  TSH 2.454   Anemia work up No results for input(s): VITAMINB12, FOLATE, FERRITIN, TIBC, IRON, RETICCTPCT in the last 72 hours. Urinalysis    Component Value Date/Time   COLORURINE YELLOW (A) 02/01/2020 1110   APPEARANCEUR HAZY (A) 02/01/2020 1110   LABSPEC 1.019 02/01/2020 1110   PHURINE 5.0 02/01/2020 1110   GLUCOSEU NEGATIVE 02/01/2020 1110   HGBUR SMALL (A) 02/01/2020 1110   BILIRUBINUR NEGATIVE 02/01/2020 1110    KETONESUR NEGATIVE 02/01/2020 1110   PROTEINUR 30 (A) 02/01/2020 1110   NITRITE NEGATIVE 02/01/2020 1110   LEUKOCYTESUR NEGATIVE 02/01/2020 1110   Sepsis Labs Invalid input(s): PROCALCITONIN,  WBC,  LACTICIDVEN Microbiology Recent Results (from the past 240 hour(s))  SARS Coronavirus 2 by RT PCR (hospital order, performed in [place] hospital lab) Nasopharyngeal Nasopharyngeal Swab     Status: None   Collection Time: 02/01/20  2:47 PM   Specimen: Nasopharyngeal Swab  Result Value Ref Range Status   SARS Coronavirus 2 NEGATIVE NEGATIVE Final    Comment: (NOTE) SARS-CoV-2 target nucleic acids are NOT DETECTED.  The SARS-CoV-2 RNA is generally detectable in upper and lower respiratory specimens during the acute phase of infection. The lowest concentration of SARS-CoV-2 viral copies this assay can detect is 250 copies / mL. A negative result does not preclude SARS-CoV-2 infection and should not be used as the sole basis for treatment or other patient management decisions.  A negative result may occur with improper specimen collection / handling, submission of specimen other than nasopharyngeal swab, presence of viral mutation(s) within the areas targeted by this assay, and inadequate number of viral copies (<250 copies / mL). A negative result must be combined with clinical observations, patient history, and epidemiological information.  Fact Sheet for Patients:   StrictlyIdeas.no  Fact Sheet for Healthcare Providers: BankingDealers.co.za  This test is not yet approved or  cleared by the [place] FDA and has been authorized for detection and/or diagnosis of SARS-CoV-2 by FDA  under an Emergency Use Authorization (EUA).  This EUA will remain in effect (meaning this test can be used) for the duration of the COVID-19 declaration under Section 564(b)(1) of the Act, 21 U.S.C. section 360bbb-3(b)(1), unless the authorization is  terminated or revoked sooner.  Performed at [facility], [address] [place], [place] [address]      Time coordinating discharge: Over 30 minutes  SIGNED:   Nolberto Hanlon, MD  [facility] 02/02/2020, 2:30 PM Pager   If 7PM-7AM, please contact night-coverage [email or web address] Password [email or web address]

## 2020-02-02 NOTE — Evaluation (Signed)
Physical Therapy Evaluation Patient Details Name: Richard Wiggins MRN: [id number] DOB: 1951/05/13 Today's Date: 02/02/2020   History of Present Illness  Patient is a 69 year old male who presented to ED with progressive BLE weakness. Reports progressive weakness since stroke, was unable to completely move legs earlier for ~1 hour.  PMH includes CHF, brain aneurysm s/p clipping, COPD, HTN, paroxysmal A fib on eliquis, CAD s/p PCI, carotid stenosis depression, HLD, BPH, GERD, CVA, HLD.  Patient recently moved to [facility] from [place] and closed on a house last wednesday.  Clinical Impression  Patient is a very pleasant 69 year old male who presents with generalized RLE weakness and instability. Patient recently moved to [facility] from [place] and closed on a house on Wednesday, has been staying at a long term air-bnb with his wife and two dogs. Patient at baseline utilizes a cane since his stroke. Patient is in the restroom with nurse present upon PT arrival. Returned to sitting EOB after utilizing restroom for muscle testing and conversing. Patient demosntrates RLE weakness and limited coordination/spatial awareness. He is able to perform bed mobility ind and transfers with Mod I. Patient is able to stand w/o UE support however upon ambulation he requires use of SPC. Head turns result in instability requiring CGA/supervision with ambulation. Patient returned to bed with all needs met; educated on stretching and heel toe raises for cramp reduction. Patient will benefit from skilled physical therapy while hospitalized for improved strength and stability. Upon discharge patient will benefit from HHPT to reduce fall risk, improve capacity for mobility, and return to PLOF.     Follow Up Recommendations Home health PT    Equipment Recommendations       Recommendations for Other Services       Precautions / Restrictions Precautions Precautions: None Restrictions Weight Bearing Restrictions: No       Mobility  Bed Mobility Overal bed mobility: Independent             General bed mobility comments: supine <>sit without UE support, HOB elevated  Transfers Overall transfer level: Modified independent Equipment used: Straight cane             General transfer comment: Patient performs STS with CGA/supervision with SPC  Ambulation/Gait Ambulation/Gait assistance: Min guard Gait Distance (Feet): 150 Feet Assistive device: Straight cane   Gait velocity: decreased   General Gait Details: Patient demonstrates slightly less weight acceptance onto RLE, heavy reliance upon cane with prolonged ambulation, decreased BOS and arm swing.  Stairs            Wheelchair Mobility    Modified Rankin (Stroke Patients Only)       Balance Overall balance assessment: Needs assistance Sitting-balance support: No upper extremity supported;Feet unsupported Sitting balance-Leahy Scale: Good Sitting balance - Comments: able to reach without LOB, slight left tilt preference   Standing balance support: Single extremity supported;During functional activity Standing balance-Leahy Scale: Fair Standing balance comment: Requires use of SUE support for dynamic mobility. able to static stand w/o UE support.               High Level Balance Comments: instability with head turns.             Pertinent Vitals/Pain Pain Assessment: No/denies pain    Home Living Family/patient expects to be discharged to:: Private residence Living Arrangements: Spouse/significant other Available Help at Discharge: Family Type of Home: House Home Access: Level entry     Home Layout: Two level;Able to live on  main level with bedroom/bathroom Home Equipment: Cane - single point;Shower seat Additional Comments: Patient recently closed on new house since moving to [place], was in a long stay air b n b. Walks with a cane at baseline    Prior Function Level of Independence: Independent with  assistive device(s)         Comments: Patient walks with a cane at baseline since his stroke. Is ind with mobility and ADLs with AD.     Hand Dominance        Extremity/Trunk Assessment   Upper Extremity Assessment Upper Extremity Assessment: Overall WFL for tasks assessed    Lower Extremity Assessment Lower Extremity Assessment: RLE deficits/detail;LLE deficits/detail RLE Deficits / Details: grossly 4-/5 RLE Sensation: decreased proprioception RLE Coordination: WNL LLE Deficits / Details: grossly 4+/5 LLE Sensation: WNL LLE Coordination: WNL       Communication   Communication: No difficulties  Cognition Arousal/Alertness: Awake/alert Behavior During Therapy: WFL for tasks assessed/performed Overall Cognitive Status: Within Functional Limits for tasks assessed                                 General Comments: Patient is pleasant and oriented x 4.      General Comments General comments (skin integrity, edema, etc.): Patient appears well nourished and groomed.    Exercises General Exercises - Lower Extremity Ankle Circles/Pumps: Strengthening;Both;15 reps;Seated Other Exercises Other Exercises: Patient educated on role of PT in acute care setting, safe mobility and transfers. Ambulation with SPC as well as bathroom mobility.   Assessment/Plan    PT Assessment Patient needs continued PT services  PT Problem List Decreased strength;Decreased activity tolerance;Decreased balance;Decreased mobility       PT Treatment Interventions DME instruction;Gait training;Stair training;Functional mobility training;Therapeutic activities;Patient/family education;Neuromuscular re-education;Balance training;Therapeutic exercise;Manual techniques    PT Goals (Current goals can be found in the Care Plan section)  Acute Rehab PT Goals Patient Stated Goal: to return home PT Goal Formulation: With patient Time For Goal Achievement: 02/16/20 Potential to Achieve  Goals: Fair    Frequency Min 2X/week   Barriers to discharge   would benefit from HHPT    Co-evaluation               AM-PAC PT "6 Clicks" Mobility  Outcome Measure Help needed turning from your back to your side while in a flat bed without using bedrails?: None Help needed moving from lying on your back to sitting on the side of a flat bed without using bedrails?: None Help needed moving to and from a bed to a chair (including a wheelchair)?: A Little Help needed standing up from a chair using your arms (e.g., wheelchair or bedside chair)?: A Little Help needed to walk in hospital room?: A Little Help needed climbing 3-5 steps with a railing? : A Little 6 Click Score: 20    End of Session Equipment Utilized During Treatment: Gait belt Activity Tolerance: Patient tolerated treatment well Patient left: in bed;with call bell/phone within reach;with bed alarm set Nurse Communication: Mobility status PT Visit Diagnosis: Unsteadiness on feet (R26.81);Muscle weakness (generalized) (M62.81);Other abnormalities of gait and mobility (R26.89)    Time: 4932-4199 PT Time Calculation (min) (ACUTE ONLY): 16 min   Charges:   PT Evaluation $PT Eval Moderate Complexity: 1 Mod         Janna Arch, PT, DPT   02/02/2020, 9:20 AM

## 2020-02-02 NOTE — Progress Notes (Signed)
AVS gone over with patient and spouse at bedside, all questions answered.

## 2020-02-02 NOTE — Care Management Obs Status (Signed)
[place] NOTIFICATION   Patient Details  Name: Richard Wiggins MRN: [id number] Date of Birth: Nov 13, 1950   Medicare Observation Status Notification Given:  Other (see comment) (Verbally agreed via Phone, unable to print working remote)    [employer], LCSW 02/02/2020, 2:28 PM

## 2020-02-04 ENCOUNTER — Other Ambulatory Visit: Payer: Self-pay

## 2020-02-04 ENCOUNTER — Encounter: Payer: Self-pay | Admitting: *Deleted

## 2020-02-04 DIAGNOSIS — R911 Solitary pulmonary nodule: Secondary | ICD-10-CM

## 2020-02-04 NOTE — Progress Notes (Signed)
  Oncology Nurse Navigator Documentation  Navigator Location: [facility]Med Onc (02/04/20 1600) Referral Date to RadOnc/MedOnc: 02/02/20 (02/04/20 1600) )Navigator Encounter Type: Introductory Phone Call (02/04/20 1600)   Abnormal Finding Date: 02/02/20 (02/04/20 1600)                   Treatment Phase: Abnormal Scans (02/04/20 1600) Barriers/Navigation Needs: Coordination of Care (02/04/20 1600)   Interventions: Coordination of Care;Referrals (02/04/20 1600) Referrals: Pulmonary (02/04/20 1600) Coordination of Care: Appts;Radiology (02/04/20 1600)        Acuity: Level 2-Minimal Needs (1-2 Barriers Identified) (02/04/20 1600)    phone call made to patient to introduce to navigator services and review upcoming appts. All questions answered during phone call. Contact info given and instructed to call back with any questions or needs. Pt verbalized understanding. Appts mailed per pt request to [place], [place] [address]. Nothing further needed at this time.     Time Spent with Patient: 30 (02/04/20 1600)

## 2020-02-05 ENCOUNTER — Ambulatory Visit (INDEPENDENT_AMBULATORY_CARE_PROVIDER_SITE_OTHER): Payer: Medicare Other | Admitting: Nurse Practitioner

## 2020-02-05 ENCOUNTER — Encounter: Payer: Self-pay | Admitting: Nurse Practitioner

## 2020-02-05 VITALS — BP 150/98 | HR 64 | Temp 97.6°F | Ht 70.0 in | Wt 205.0 lb

## 2020-02-05 DIAGNOSIS — I10 Essential (primary) hypertension: Secondary | ICD-10-CM | POA: Diagnosis not present

## 2020-02-05 DIAGNOSIS — I480 Paroxysmal atrial fibrillation: Secondary | ICD-10-CM

## 2020-02-05 DIAGNOSIS — D649 Anemia, unspecified: Secondary | ICD-10-CM | POA: Diagnosis not present

## 2020-02-05 DIAGNOSIS — N400 Enlarged prostate without lower urinary tract symptoms: Secondary | ICD-10-CM | POA: Diagnosis not present

## 2020-02-05 DIAGNOSIS — I6529 Occlusion and stenosis of unspecified carotid artery: Secondary | ICD-10-CM

## 2020-02-05 DIAGNOSIS — I5022 Chronic systolic (congestive) heart failure: Secondary | ICD-10-CM

## 2020-02-05 DIAGNOSIS — I2510 Atherosclerotic heart disease of native coronary artery without angina pectoris: Secondary | ICD-10-CM

## 2020-02-05 DIAGNOSIS — Z7689 Persons encountering health services in other specified circumstances: Secondary | ICD-10-CM

## 2020-02-05 DIAGNOSIS — J449 Chronic obstructive pulmonary disease, unspecified: Secondary | ICD-10-CM

## 2020-02-05 DIAGNOSIS — E785 Hyperlipidemia, unspecified: Secondary | ICD-10-CM

## 2020-02-05 DIAGNOSIS — R7303 Prediabetes: Secondary | ICD-10-CM

## 2020-02-05 DIAGNOSIS — I639 Cerebral infarction, unspecified: Secondary | ICD-10-CM

## 2020-02-05 DIAGNOSIS — R918 Other nonspecific abnormal finding of lung field: Secondary | ICD-10-CM

## 2020-02-05 DIAGNOSIS — F1290 Cannabis use, unspecified, uncomplicated: Secondary | ICD-10-CM

## 2020-02-05 NOTE — Patient Instructions (Signed)
Please record your blood pressure at home as we discussed blood count and bring it in to review.  Please bring in your medications that you take from home, and the actual pill bottles to review.  I placed referral into the kidney specialist.  Please return the office in 2 weeks with blood pressure readings and  we will consider laboratory studies at that time as needed.

## 2020-02-05 NOTE — Progress Notes (Signed)
 New Patient Office Visit  Subjective:  Patient ID: Richard Wiggins, male    DOB: 05/05/1951  Age: 68 y.o. MRN: [id number]  CC:  Chief Complaint  Patient presents with  . New Patient (Initial Visit)    establish care   HPI Richard Wiggins is a 68-year-old male with history of HTN, CVA 2018, PE- provoked at time of hospitalization for CVA, brain aneurysm status post clipping, CHF, paroxysmal atrial fibrillation on Eliquis, CAD s/p PCI to RCA 2018 (LAD 50%, LCx20%), carotid stenosis s/p CEA 2018, hyperlipidemia, BPH, COPD, anemia, OSA, depression, previously on Lexapro who presents to establish care with a primary care provider.   Social: He moved to this area last month from [place] with his wife. They just got into their home last week. Very new to the area. His wife has family in [place], [place].  They wanted to get away from the mountains and cold snowy winters in [place]. 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 Children x2 and grandchildren grown. Former cigarette smoker from approximately age 13 to age 66 a pack per day. He is a daily marijuana smoker.  He is now vaping CBD daily.  Occasional social  alcohol history. No history of drug abuse.  Hx of CVA/Carotid stenosis: Recent hospital admission at [facility] 02/01/2020 through 02/02/2020  for bilateral lower extremity weakness.  CT of head ruled out for acute stroke.  Neck CT angio showed abnormal circulation, and neurology followed him and did not think this was an acute stroke and recommended he continue on the aspirin, Eliquis.    CT angio 02/01/20 also picked up  bilateral upper lobe spiculated nodular opacities measuring up to 0.9 cm.  Emphysema.  A dedicated CT of the chest with contrast was performed showing a spiculated nodule in the left upper lobe 1.2 x 1.1 cm. This finding is  concerning for primary lung malignancy.  Additional smaller nodules in the right lung, and tiny centrilobular pulmonary nodules most numerous in  the lung apices, most commonly seen with respiratory bronchiolitis, emphysema and coronary artery disease and aortic atherosclerosis.  A PET/CT scan from skull base to thigh is ordered for 02/12/2020.  He has a Hematology consult pending and is followed by the oncology nurse navigator at this time.  COPD/Lung nodules/emphysema: Presents on Trelegy Ellipta 1 puff daily, has albuterol inhaler to use as needed, has a DuoNeb to use as needed.  Question status of sleep apnea as he was sent for a sleep study per chart review.  Essential HTN: Blood pressure is elevated 150/98-pulse  today, but he reports white coat syndrome. This is his first visit here with our clinic today. He is maintained on carvedilol 25 mg 1/2 tablet twice daily, Entresto -sacubitril-valsartan 49-51 mg 1/2 tablet twice daily, spironolactone 25 mg tablet 1/2  daily at bedtime.  He takes the  furosemide 20 mg as needed- taking a couple times a week for chronic mild ankle edema.  He never drinks water- prefers tea and coffee.  He had chronic kidney disease when he was on full-strength antihypertensives.  He has done better with these half-strength doses.  Kidney function normalized.  CAD/AFIB/CHF: Recent BNP was elevated 293. 02/01/2020: EF 60-65% with normal diastolic function per recent echocardiogram.  He remains on Coreg, Entresto, spironolactone, Lasix as needed for chronic 1+ lower extremity edema.  He has not regular sinus rhythm now.  He denies any chest pain, pressure, heaviness, tightness, shortness of breath out of the ordinary.       Pre Diabetes/BMI 29.82/HLD: Chart review shows he had a hemoglobin A1c of 5.7 in April 2021.  He has overweight according to BMI scale.  His LDL was at goal in April under 70.  Current LDL is elevated.  He presents on atorvastatin 40 mg daily at bedtime.  Will monitor after acute hospitalization.  Lab Results  Component Value Date   CHOL 166 02/01/2020   HDL 49 02/01/2020   LDLCALC 101 (H) 02/01/2020    TRIG 79 02/01/2020   CHOLHDL 3.4 02/01/2020   Wt Readings from Last 3 Encounters:  02/07/20 (!) 207 lb 12.8 oz (94.3 kg)  02/05/20 (!) 205 lb (93 kg)  02/01/20 (!) 206 lb (93.4 kg)   Lab Results  Component Value Date   HGBA1C 5.7 (H) 02/01/2020    Anemia: After stroke anemic for months and saw Hematologist 07/04/2018.  He had normocytic anemia, ferritin of 87, T sat 20%, B12 264, erythropoietin 27.4 and Hgb 11.7-12.3 with creatinine 1.8 thought secondary to his chronic disease, mild renal insufficiency and mildly elevated EPO.  He did not have iron deficiency anemia and iron supplement was stopped. He was followed by Nephrology for CKD-which resolved back to normal.  Hematology consult was placed in the hospital.   Lab Results  Component Value Date   HGB 12.0 (L) 02/01/2020   BPH: Maintained on  tamsulosin 0.4 mg daily with good management.   Past Medical History:  Diagnosis Date  . Anemia 02/09/2020  . BPH (benign prostatic hyperplasia) 02/09/2020  . Brain aneurysm   . Chronic kidney disease   . COPD (chronic obstructive pulmonary disease) ([place])   . History of common carotid artery stent placement   . Hypertension   . IDA (iron deficiency anemia) 02/09/2020  . 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 Marijuana use, continuous 02/10/2020  . Stroke [facility])     Past Surgical History:  Procedure Laterality Date  . brain aneurysm with clip    . CORONARY ANGIOPLASTY WITH STENT PLACEMENT  07/08/2017  . hip repleacement Right     Family History  Problem Relation Age of Onset  . Diabetes Mother   . Heart disease Mother   . Stroke Father   . Heart disease Father   . Heart attack Father   . Alcohol abuse Father   . Heart disease Sister   . Heart attack Sister   . Heart disease Maternal Grandmother   . Heart attack Maternal Grandmother   . Heart attack Paternal Grandmother   . Diabetes Brother   . Heart disease Brother   . Heart attack Brother     Social History    Socioeconomic History  . Marital status: Married    Spouse name: Not on file  . Number of children: Not on file  . Years of education: Not on file  . Highest education level: High school graduate  Occupational History  . Occupation: Retired  Tobacco Use  . Smoking status: Former Research scientist (life sciences)  . Smokeless tobacco: Never Used  Vaping Use  . Vaping Use: Some days  . Devices: cbd   Substance and Sexual Activity  . Alcohol use: Yes  . Drug use: Never  . Sexual activity: Not Currently  Other Topics Concern  . Not on file  Social History Narrative   Lives with wife. 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 Children x2 children and grandchildren grown.    Social Determinants of Health   Financial Resource Strain:   . Difficulty of Paying Living Expenses:  Food Insecurity:   . Worried About Running Out of Food in the Last Year:   . Ran Out of Food in the Last Year:   Transportation Needs:   . Lack of Transportation (Medical):   . Lack of Transportation (Non-Medical):   Physical Activity:   . Days of Exercise per Week:   . Minutes of Exercise per Session:   Stress:   . Feeling of Stress :   Social Connections:   . Frequency of Communication with Friends and Family:   . Frequency of Social Gatherings with Friends and Family:   . Attends Religious Services:   . Active Member of Clubs or Organizations:   . Attends Club or Organization Meetings:   . Marital Status:   Intimate Partner Violence:   . Fear of Current or Ex-Partner:   . Emotionally Abused:   . Physically Abused:   . Sexually Abused:     ROS Review of Systems  Constitutional: Negative for chills and fever.  HENT: Negative for congestion.   Eyes: Negative.   Respiratory:       DOE  Cardiovascular: Positive for leg swelling. Negative for chest pain and palpitations.  Gastrointestinal: Negative for abdominal pain and blood in stool.  Endocrine: Negative.   Genitourinary: Positive for difficulty urinating.   Musculoskeletal: Negative.        Chronic back and leg pain daily and takes Tylenol.Walks cane.  Right  trigger finger- splinted.  Skin: Negative.   Allergic/Immunologic: Negative.   Neurological: Positive for syncope and weakness. Negative for dizziness, seizures, speech difficulty, light-headedness and headaches.       Some memory loss  Psychiatric/Behavioral: Negative.        He denies concerns about depression/anxiety.    Objective:   Today's Vitals: BP (!) 150/98 (BP Location: Left Arm, Patient Position: Sitting, Cuff Size: Normal)   Pulse 64   Temp 97.6 F (36.4 C) (Oral)   Ht 5' 10" (1.778 m)   Wt (!) 205 lb (93 kg)   SpO2 96%   BMI 29.41 kg/m   Physical Exam Vitals reviewed.  Constitutional:      Appearance: Normal appearance.  HENT:     Head: Normocephalic.  Eyes:     Conjunctiva/sclera: Conjunctivae normal.     Pupils: Pupils are equal, round, and reactive to light.  Cardiovascular:     Rate and Rhythm: Normal rate and regular rhythm.     Pulses: Normal pulses.     Heart sounds: Normal heart sounds. No murmur heard.   Pulmonary:     Effort: Pulmonary effort is normal.     Breath sounds: Normal breath sounds.  Abdominal:     Palpations: Abdomen is soft.     Tenderness: There is no abdominal tenderness.  Musculoskeletal:        General: Normal range of motion.     Cervical back: Normal range of motion and neck supple.     Right lower leg: Edema present.     Left lower leg: Edema present.     Comments: 1+ pitting lower ext edema  Skin:    General: Skin is warm and dry.  Neurological:     General: No focal deficit present.     Mental Status: He is alert and oriented to person, place, and time.  Psychiatric:        Mood and Affect: Mood normal.        Behavior: Behavior normal.     Comments: PHQ-7: 4, GAD-7: 5. No   SI/HI.  Quiet. His wife gives most of his history today and keeps track of the details and medications.     Laboratory data outside  records: 10/19/2019 -TSH 4.99, free T4 1.14 -WBC 4.1, hemoglobin 12.3, hematocrit 38.8, MCV 93, MCH 29.4, platelets 178 -Glucose 85, BUN 13, creatinine 0.93, EGFR 83, 1 sodium 142, potassium 4.3, chloride 106, CO2 to 23, calcium 9.2, albumin 3.9, total bilirubin 0.4, alkaline phosphatase 145, AST 19, ALT 16, -Urinalysis occult blood negative, bilirubin negative, glucose negative, protein trace, -Hemoglobin A1c 5.7 -PSA 0.8 -Cholesterol 127, triglycerides 73, HDL 49, LDL 63 -Vitamin D 25 -Phosphorus 3.2 -Magnesium 1.8 -T3 free 2.9 -PTH intact 56   Assessment & Plan:   Problem List Items Addressed This Visit      Cardiovascular and Mediastinum   Chronic systolic CHF (congestive heart failure) (HCC) (Chronic)   A-fib (HCC) (Chronic)   CVA (cerebral vascular accident) ([place]) (Chronic)   CAD (coronary artery disease), native coronary artery (Chronic)   Carotid artery stenosis (Chronic)     Respiratory   COPD (chronic obstructive pulmonary disease) (HCC) (Chronic)     Genitourinary   BPH (benign prostatic hyperplasia)     Other   HLD (hyperlipidemia) (Chronic)   Anemia   Indeterminate pulmonary nodules   Marijuana use, continuous    Other Visit Diagnoses    Encounter to establish care with new doctor    -  Primary   Benign essential HTN       Relevant Orders   Ambulatory referral to Nephrology      Outpatient Encounter Medications as of 02/05/2020  Medication Sig  . albuterol (VENTOLIN HFA) 108 (90 Base) MCG/ACT inhaler Inhale 2 puffs into the lungs every 4 (four) hours as needed.  [name] apixaban (ELIQUIS) 5 MG TABS tablet Take 5 mg by mouth in the morning and at bedtime.  [name] aspirin EC 81 MG tablet Take 81 mg by mouth daily. Swallow whole.  [name] atorvastatin (LIPITOR) 40 MG tablet Take 1 tablet (40 mg total) by mouth at bedtime.  . carvedilol (COREG) 25 MG tablet Take 12.5 mg by mouth in the morning and at bedtime.  . Fluticasone-Umeclidin-Vilant (TRELEGY ELLIPTA) 100-62.5-25  MCG/INH AEPB Inhale 1 puff into the lungs daily.  . furosemide (LASIX) 20 MG tablet Take 1-2 tablets by mouth daily as needed for fluid.  [name] ipratropium-albuterol (DUONEB) 0.5-2.5 (3) MG/3ML SOLN Inhale 3 mLs into the lungs every 6 (six) hours as needed.  . sacubitril-valsartan (ENTRESTO) 49-51 MG Take 0.5 tablets by mouth in the morning and at bedtime.  [name] spironolactone (ALDACTONE) 25 MG tablet Take 12.5 mg by mouth at bedtime.  . tamsulosin (FLOMAX) 0.4 MG CAPS capsule Take 1 capsule (0.4 mg total) by mouth daily.   No facility-administered encounter medications on file as of 02/05/2020.   Richard Wiggins  is a 69 year old patient who was reportedly in good health until he had his stroke in November 2018.  At that time he was also diagnosed with heart failure and developed atrial fib and provoked PE while in the hospital, pneumonia, CAD status post PCI 06/2017, carotid stenosis status post left stent, COPD, with DOE with mild to moderate activity he developed anemia, transient chronic kidney disease,and new AAA.   He comes in with his wife to establish care and is 3 days  post discharge for bilateral lower leg weakness and ruled out for CVA per imaging studies.  Hospital labs, imaging, discharge note, specialist notes, current medications, allergies, PMH/PSH/FH, social were reviewed.  He was followed by Neurology in the hospital.  He is also followed by Cardiology and appears to be stable on current medication for CAD/CHF/paroxysmal atrial fib now in sinus rhythm, CVA, carotid artery stenosis.  He is on appropriate medication.  His lungs are clear today.  He does have 1+ pitting ankle edema but reports that his baseline.  He has no chest pain or shortness of breath.  Blood pressure is elevated today and he reports he is anxious.  Reports white coat syndrome.  We will ask him to record home  blood pressure and bring in to review at next office visit in 2 weeks. He was also asked to bring in the medications  that he takes from home so we can look at the actual pill bottles.  He is on several medications and his wife keeps track.   He does have hematology consult pending. He does have a PET scan pending to further evaluate pulmonary nodules and there is concern about a new malignancy.   He has a Pulmonary consult ordered for COPD/Emphysema/ PULM nodules r/o primary malignancy.   He does not need referral to a kidney specialist.  His wife was worried about that, but chart review shows he is very stable.  We will cancel referral.  He will need repeat CBC, CMET and CPE labs early next week. Close follow-up office visit as he gets established here. Old records from [place] arrived and will be scanned in.      Follow-up: Return in about 2 weeks (around 02/19/2020).   Denice Paradise, NP

## 2020-02-07 ENCOUNTER — Ambulatory Visit: Payer: Medicare Other | Admitting: Cardiology

## 2020-02-07 ENCOUNTER — Other Ambulatory Visit: Payer: Self-pay

## 2020-02-07 ENCOUNTER — Encounter: Payer: Self-pay | Admitting: Cardiology

## 2020-02-07 VITALS — BP 108/68 | HR 62 | Ht 70.0 in | Wt 207.8 lb

## 2020-02-07 DIAGNOSIS — I5020 Unspecified systolic (congestive) heart failure: Secondary | ICD-10-CM | POA: Diagnosis not present

## 2020-02-07 DIAGNOSIS — I639 Cerebral infarction, unspecified: Secondary | ICD-10-CM

## 2020-02-07 DIAGNOSIS — I480 Paroxysmal atrial fibrillation: Secondary | ICD-10-CM

## 2020-02-07 DIAGNOSIS — I2510 Atherosclerotic heart disease of native coronary artery without angina pectoris: Secondary | ICD-10-CM | POA: Diagnosis not present

## 2020-02-07 NOTE — Progress Notes (Signed)
Cardiology Office Note:    Date:  02/07/2020   ID:  Richard Wiggins, DOB 1950-09-23, MRN [id number]  PCP:  [name], NP  [facility] Cardiologist:  No primary care provider on file.  [facility] Electrophysiologist:  None   Referring MD: No ref. provider found   No chief complaint on file.   History of Present Illness:    Richard Wiggins is a 69 y.o. 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 fib on Eliquis, CAD s/p PCI to RCA 2018 (LAD 50%, LCx 20%) in [place], former smoker x40+ years, COPD who presents to establish care  Patient admitted on 02/01/2020 due to lower extremity weakness. Head CT did not show an acute infarct. Neck CT angio showed circulation of occlusion, neurology consulted but did not think patient had an acute stroke. Aspirin on Eliquis was recommended.  Patient's her prior echocardiogram in 06/08/2017 in [place] noted with EF 30 to 35%.  Underwent a left heart catheter coronary in 2018 showing severe stenosis in the 85%.  He underwent PCI to the RCA with drug-eluting stent.  During that hospitalization, EKG/telemetry monitoring showed paroxysmal A. fib.  Also had a stroke during that hospitalization and CEA was performed.  He was subsequently placed on Eliquis.  Since then, he has felt well, apart from left leg weakness a week ago. Currently takes Entresto, Coreg, spironolactone.  Echocardiogram on 02/01/2020 showed normal systolic function, EF 60 to 84%, normal diastolic function.  Past Medical History:  Diagnosis Date  . Brain aneurysm   . Chronic kidney disease   . COPD (chronic obstructive pulmonary disease) ([place])   . History of common carotid artery stent placement   . Hypertension   . Stroke [facility])     Past Surgical History:  Procedure Laterality Date  . brain aneurysm with clip    . CORONARY ANGIOPLASTY WITH STENT PLACEMENT      Current Medications: Current Meds   Medication Sig  . albuterol (VENTOLIN HFA) 108 (90 Base) MCG/ACT inhaler Inhale 2 puffs into the lungs every 4 (four) hours as needed.  [name] apixaban (ELIQUIS) 5 MG TABS tablet Take 5 mg by mouth in the morning and at bedtime.  [name] aspirin EC 81 MG tablet Take 81 mg by mouth daily. Swallow whole.  [name] atorvastatin (LIPITOR) 40 MG tablet Take 1 tablet (40 mg total) by mouth at bedtime.  . carvedilol (COREG) 25 MG tablet Take 12.5 mg by mouth in the morning and at bedtime.  . Fluticasone-Umeclidin-Vilant (TRELEGY ELLIPTA) 100-62.5-25 MCG/INH AEPB Inhale 1 puff into the lungs daily.  . furosemide (LASIX) 20 MG tablet Take 1-2 tablets by mouth daily as needed for fluid.  [name] ipratropium-albuterol (DUONEB) 0.5-2.5 (3) MG/3ML SOLN Inhale 3 mLs into the lungs every 6 (six) hours as needed.  . sacubitril-valsartan (ENTRESTO) 49-51 MG Take 0.5 tablets by mouth in the morning and at bedtime.  [name] spironolactone (ALDACTONE) 25 MG tablet Take 12.5 mg by mouth at bedtime.  . tamsulosin (FLOMAX) 0.4 MG CAPS capsule Take 1 capsule (0.4 mg total) by mouth daily.     Allergies:   Patient has no known allergies.   Social History   Socioeconomic History  . Marital status: Married    Spouse name: Not on file  . Number of children: Not on file  . Years of education: Not on file  . Highest education level: High school graduate  Occupational History  . Occupation:  Retired  Tobacco Use  . Smoking status: Former Research scientist (life sciences)  . Smokeless tobacco: Never Used  Vaping Use  . Vaping Use: Some days  . Devices: cbd   Substance and Sexual Activity  . Alcohol use: Yes  . Drug use: Never  . Sexual activity: Not Currently  Other Topics Concern  . Not on file  Social History Narrative   Lives with wife. 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 Children x2 children and grandchildren grown.    Social Determinants of Health   Financial Resource Strain:   . Difficulty of Paying Living Expenses:   Food Insecurity:   .  Worried About Charity fundraiser in the Last Year:   . Arboriculturist in the Last Year:   Transportation Needs:   . Film/video editor (Medical):   [name] Lack of Transportation (Non-Medical):   Physical Activity:   . Days of Exercise per Week:   . Minutes of Exercise per Session:   Stress:   . Feeling of Stress :   Social Connections:   . Frequency of Communication with Friends and Family:   . Frequency of Social Gatherings with Friends and Family:   . Attends Religious Services:   . Active Member of Clubs or Organizations:   . Attends Archivist Meetings:   [name] Marital Status:      Family History: The patient's family history includes Alcohol abuse in his father; Diabetes in his brother and mother; Heart attack in his brother, father, maternal grandmother, paternal grandmother, and sister; Heart disease in his brother, father, maternal grandmother, mother, and sister; Stroke in his father.  ROS:   Please see the history of present illness.     All other systems reviewed and are negative.  EKGs/Labs/Other Studies Reviewed:    The following studies were reviewed today:   EKG:  EKG is  ordered today.  The ekg ordered today demonstrates normal sinus rhythm, normal ECG.  Recent Labs: 02/01/2020: ALT 16; B Natriuretic Peptide 293.0; Hemoglobin 12.0; Magnesium 1.9; Platelets 145; TSH 2.454 02/02/2020: BUN 16; Creatinine, Ser 1.01; Potassium 3.8; Sodium 140  Recent Lipid Panel    Component Value Date/Time   CHOL 166 02/01/2020 1838   TRIG 79 02/01/2020 1838   HDL 49 02/01/2020 1838   CHOLHDL 3.4 02/01/2020 1838   VLDL 16 02/01/2020 1838   LDLCALC 101 (H) 02/01/2020 1838    Physical Exam:    VS:  BP 108/68   Pulse 62   Ht 5\' 10"  (1.778 m)   Wt (!) 207 lb 12.8 oz (94.3 kg)   SpO2 99%   BMI 29.82 kg/m     Wt Readings from Last 3 Encounters:  02/07/20 (!) 207 lb 12.8 oz (94.3 kg)  02/05/20 (!) 205 lb (93 kg)  02/01/20 (!) 206 lb (93.4 kg)     GEN:  Well  nourished, well developed in no acute distress HEENT: Normal NECK: No JVD; No carotid bruits LYMPHATICS: No lymphadenopathy CARDIAC: RRR, no murmurs, rubs, gallops RESPIRATORY:  Clear to auscultation without rales, wheezing or rhonchi  ABDOMEN: Soft, non-tender, non-distended MUSCULOSKELETAL:  1+ edema; No deformity  SKIN: Warm and dry NEUROLOGIC:  Alert and oriented x 3 PSYCHIATRIC:  Normal affect   ASSESSMENT:    1. Coronary artery disease involving native coronary artery of native heart without angina pectoris   2. HFrEF (heart failure with reduced ejection fraction) (HCC)   3. Paroxysmal atrial fibrillation ([place])   4. Cerebrovascular accident (CVA),  unspecified mechanism ([place])    PLAN:    In order of problems listed above:  1. Patient with history of CAD status post PCI to RCA.  Continue aspirin, statin. 2. History of heart failure reduced EF, last echocardiogram showed normal EF 60 to 65%.  Continue Coreg, Entresto, spironolactone.  Okay to take Lasix as needed, 1+ edema noted on exam 3. History of paroxysmal atrial fibrillation, currently in sinus rhythm.  Continue Coreg, Eliquis. 4. History of CVA, continue statin, aspirin, and Eliquis.  Follow-up in 4 months.  Total encounter time  60 minutes  Greater than 50% was spent in counseling and coordination of care with the patient    Medication Adjustments/Labs and Tests Ordered: Current medicines are reviewed at length with the patient today.  Concerns regarding medicines are outlined above.  Orders Placed This Encounter  Procedures  . EKG 12-Lead   No orders of the defined types were placed in this encounter.   Patient Instructions  Medication Instructions:   Your physician recommends that you continue on your current medications as directed. Please refer to the Current Medication list given to you today.  *If you need a refill on your cardiac medications before your next appointment, please call your  pharmacy*   Lab Work: None Ordered If you have labs (blood work) drawn today and your tests are completely normal, you will receive your results only by: [name] MyChart Message (if you have MyChart) OR . A paper copy in the mail If you have any lab test that is abnormal or we need to change your treatment, we will call you to review the results.   Testing/Procedures: None Ordered   Follow-Up: At [facility], you and your health needs are our priority.  As part of our continuing mission to provide you with exceptional heart care, we have created designated Provider Care Teams.  These Care Teams include your primary Cardiologist (physician) and Advanced Practice Providers (APPs -  Physician Assistants and Nurse Practitioners) who all work together to provide you with the care you need, when you need it.  We recommend signing up for the patient portal called "MyChart".  Sign up information is provided on this After Visit Summary.  MyChart is used to connect with patients for Virtual Visits (Telemedicine).  Patients are able to view lab/test results, encounter notes, upcoming appointments, etc.  Non-urgent messages can be sent to your provider as well.   To learn more about what you can do with MyChart, go to [email or web address].    Your next appointment:   4 month(s)  The format for your next appointment:   In Person  Provider:   Kate Sable, MD   Other Instructions N/A     Signed, Kate Sable, MD  02/07/2020 12:36 PM    [place]

## 2020-02-07 NOTE — Patient Instructions (Signed)
Medication Instructions:   Your physician recommends that you continue on your current medications as directed. Please refer to the Current Medication list given to you today.  *If you need a refill on your cardiac medications before your next appointment, please call your pharmacy*   Lab Work: None Ordered If you have labs (blood work) drawn today and your tests are completely normal, you will receive your results only by:  [facility] (if you have MyChart) OR  A paper copy in the mail If you have any lab test that is abnormal or we need to change your treatment, we will call you to review the results.   Testing/Procedures: None Ordered   Follow-Up: At [facility], you and your health needs are our priority.  As part of our continuing mission to provide you with exceptional heart care, we have created designated Provider Care Teams.  These Care Teams include your primary Cardiologist (physician) and Advanced Practice Providers (APPs -  Physician Assistants and Nurse Practitioners) who all work together to provide you with the care you need, when you need it.  We recommend signing up for the patient portal called "MyChart".  Sign up information is provided on this After Visit Summary.  MyChart is used to connect with patients for Virtual Visits (Telemedicine).  Patients are able to view lab/test results, encounter notes, upcoming appointments, etc.  Non-urgent messages can be sent to your provider as well.   To learn more about what you can do with MyChart, go to [email or web address].    Your next appointment:   4 month(s)  The format for your next appointment:   In Person  Provider:   Kate Sable, MD   Other Instructions N/A

## 2020-02-09 ENCOUNTER — Encounter: Payer: Self-pay | Admitting: Nurse Practitioner

## 2020-02-09 DIAGNOSIS — D649 Anemia, unspecified: Secondary | ICD-10-CM

## 2020-02-09 DIAGNOSIS — D61818 Other pancytopenia: Secondary | ICD-10-CM | POA: Insufficient documentation

## 2020-02-09 DIAGNOSIS — N400 Enlarged prostate without lower urinary tract symptoms: Secondary | ICD-10-CM

## 2020-02-09 DIAGNOSIS — D509 Iron deficiency anemia, unspecified: Secondary | ICD-10-CM

## 2020-02-09 HISTORY — DX: Benign prostatic hyperplasia without lower urinary tract symptoms: N40.0

## 2020-02-09 HISTORY — DX: Anemia, unspecified: D64.9

## 2020-02-09 HISTORY — DX: Iron deficiency anemia, unspecified: D50.9

## 2020-02-10 DIAGNOSIS — F1290 Cannabis use, unspecified, uncomplicated: Secondary | ICD-10-CM | POA: Insufficient documentation

## 2020-02-10 DIAGNOSIS — R918 Other nonspecific abnormal finding of lung field: Secondary | ICD-10-CM

## 2020-02-10 DIAGNOSIS — I10 Essential (primary) hypertension: Secondary | ICD-10-CM

## 2020-02-10 DIAGNOSIS — R7303 Prediabetes: Secondary | ICD-10-CM | POA: Insufficient documentation

## 2020-02-10 HISTORY — DX: Cannabis use, unspecified, uncomplicated: F12.90

## 2020-02-10 HISTORY — DX: Other nonspecific abnormal finding of lung field: R91.8

## 2020-02-10 HISTORY — DX: Essential (primary) hypertension: I10

## 2020-02-12 ENCOUNTER — Other Ambulatory Visit: Payer: Self-pay

## 2020-02-12 ENCOUNTER — Encounter
Admission: RE | Admit: 2020-02-12 | Discharge: 2020-02-12 | Disposition: A | Discharge status: Home / Self Care | Payer: Medicare Other | Source: Ambulatory Visit | Attending: Oncology | Admitting: Oncology

## 2020-02-12 DIAGNOSIS — I2510 Atherosclerotic heart disease of native coronary artery without angina pectoris: Secondary | ICD-10-CM | POA: Insufficient documentation

## 2020-02-12 DIAGNOSIS — R911 Solitary pulmonary nodule: Secondary | ICD-10-CM | POA: Insufficient documentation

## 2020-02-12 DIAGNOSIS — J9811 Atelectasis: Secondary | ICD-10-CM | POA: Insufficient documentation

## 2020-02-12 DIAGNOSIS — Z8572 Personal history of non-Hodgkin lymphomas: Secondary | ICD-10-CM | POA: Diagnosis not present

## 2020-02-12 DIAGNOSIS — I700 Atherosclerosis of aorta: Secondary | ICD-10-CM | POA: Diagnosis not present

## 2020-02-12 DIAGNOSIS — I714 Abdominal aortic aneurysm, without rupture: Secondary | ICD-10-CM | POA: Diagnosis not present

## 2020-02-12 DIAGNOSIS — J432 Centrilobular emphysema: Secondary | ICD-10-CM | POA: Insufficient documentation

## 2020-02-12 LAB — GLUCOSE, CAPILLARY: Glucose-Capillary: 75 mg/dL (ref 70–99)

## 2020-02-12 MED ORDER — FLUDEOXYGLUCOSE F - 18 (FDG) INJECTION
10.7000 | Freq: Once | INTRAVENOUS | Status: AC | PRN
  Administered 2020-02-12: 10.7 via INTRAVENOUS

## 2020-02-13 ENCOUNTER — Ambulatory Visit (INDEPENDENT_AMBULATORY_CARE_PROVIDER_SITE_OTHER): Payer: Medicare Other | Admitting: Pulmonary Disease

## 2020-02-13 ENCOUNTER — Encounter: Payer: Self-pay | Admitting: Pulmonary Disease

## 2020-02-13 VITALS — BP 128/78 | HR 63 | Temp 97.7°F | Ht 70.0 in | Wt 206.6 lb

## 2020-02-13 DIAGNOSIS — I2510 Atherosclerotic heart disease of native coronary artery without angina pectoris: Secondary | ICD-10-CM | POA: Diagnosis not present

## 2020-02-13 DIAGNOSIS — J449 Chronic obstructive pulmonary disease, unspecified: Secondary | ICD-10-CM

## 2020-02-13 DIAGNOSIS — R911 Solitary pulmonary nodule: Secondary | ICD-10-CM | POA: Diagnosis not present

## 2020-02-13 DIAGNOSIS — I639 Cerebral infarction, unspecified: Secondary | ICD-10-CM | POA: Diagnosis not present

## 2020-02-13 NOTE — Progress Notes (Signed)
Subjective:    Patient ID: Richard Wiggins, male    DOB: 13-Aug-1950, 69 y.o.   MRN: [id number]  HPI Patient is a very complex 69 year old former smoker (quit 2018) who presents for evaluation of a posterior LEFT upper lobe nodule measuring 1.2 x 1.1 cm.  Patient is kindly referred by Dr. Randa Evens.  The nodule was incidentally found on a CT scan of the chest performed during a brief admission at [facility] when the patient had a "spell" that had to be evaluated by neurology.  They were uncertain if the patient had had a TIA or some other type of neurologic event.  He has had prior CVA previously and is on anticoagulation for the same.  The patient does not verbalize much due to his prior CVA, most of the history is obtained from the wife.  He will occasionally interject a single sentence which appears to be calling gross with the rest of the conversation.  He is totally asymptomatic with regards to the nodule.  Chronic issues with dyspnea due to COPD and this has not changed.  He also has a history of cardiomyopathy but recently has been evaluated by cardiology to have vigorous left ventricular function with EF of 60 to 65% and found to be doing well from the cardiac standpoint.  Currently the patient is on Symbicort and Spiriva for management of COPD.  He is also on as needed nebulizer treatments.  As noted he is on both Eliquis and aspirin due to his prior CVA.  As noted patient was admitted on 01 February 2020 due to an episode of bilateral lower extremity weakness ability to ambulate which was transient.  Patient has had no weight loss, anorexia, increased dyspnea over baseline, fevers, chills or sweats.  No nausea vomiting or gastroesophageal reflux symptoms.  Patient recently relocated to this area from [place] in June.  Review of Systems  A 10 point review of systems was performed and it is as noted above otherwise negative.  Past Medical History:  Diagnosis Date  . 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 Anemia 02/09/2020  . Benign essential HTN 02/10/2020  . BPH (benign prostatic hyperplasia) 02/09/2020  . Brain aneurysm   . Chronic kidney disease   . COPD (chronic obstructive pulmonary disease) ([place])   . History of common carotid artery stent placement   . Hypertension   . IDA (iron deficiency anemia) 02/09/2020  . 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 Marijuana use, continuous 02/10/2020  . Pulmonary emboli ([place]) 06/07/2017   Right lower lobe pulmonary embolism small segmental, multifocal multifocal pneumonia, mediastinal lymphadenopathy, moderate centrilobular emphysema  . Stroke [facility])    Past Surgical History:  Procedure Laterality Date  . brain aneurysm with clip    . CORONARY ANGIOPLASTY WITH STENT PLACEMENT  07/08/2017  . hip repleacement Right    Right total hip arthroplasty   Family History  Problem Relation Age of Onset  . Diabetes Mother   . Heart disease Mother   . Stroke Father   . Heart disease Father   . Heart attack Father   . Alcohol abuse Father   . Heart disease Sister   . Heart attack Sister   . Heart disease Maternal Grandmother   . Heart attack Maternal Grandmother   . Heart attack Paternal Grandmother   . Diabetes Brother   . Heart disease Brother   . Heart attack  Brother    Social History   Tobacco Use  . Smoking status: Former Smoker    Packs/day: 2.00    Years: 53.00    Pack years: 106.00    Types: Cigarettes    Quit date: 05/28/2017    Years since quitting: 2.7  . Smokeless tobacco: Never Used  Substance Use Topics  . Alcohol use: Yes   No Known Allergies Current Meds  Medication Sig  . albuterol (VENTOLIN HFA) 108 (90 Base) MCG/ACT inhaler Inhale 2 puffs into the lungs every 4 (four) hours as needed.  [name] apixaban (ELIQUIS) 5 MG TABS tablet Take 5 mg by mouth in the morning and at bedtime.  [name] aspirin EC 81 MG tablet Take 81 mg by  mouth daily. Swallow whole.  [name] atorvastatin (LIPITOR) 40 MG tablet Take 1 tablet (40 mg total) by mouth at bedtime.  . budesonide-formoterol (SYMBICORT) 160-4.5 MCG/ACT inhaler Inhale 2 puffs into the lungs 2 (two) times daily.  . carvedilol (COREG) 25 MG tablet Take 12.5 mg by mouth in the morning and at bedtime.  . furosemide (LASIX) 20 MG tablet Take 1-2 tablets by mouth daily as needed for fluid.  [name] ipratropium-albuterol (DUONEB) 0.5-2.5 (3) MG/3ML SOLN Inhale 3 mLs into the lungs every 6 (six) hours as needed.  . sacubitril-valsartan (ENTRESTO) 49-51 MG Take 0.5 tablets by mouth in the morning and at bedtime.  [name] spironolactone (ALDACTONE) 25 MG tablet Take 12.5 mg by mouth at bedtime.  . tamsulosin (FLOMAX) 0.4 MG CAPS capsule Take 1 capsule (0.4 mg total) by mouth daily.  [name] umeclidinium bromide (INCRUSE ELLIPTA) 62.5 MCG/INH AEPB Inhale 1 puff into the lungs daily.  . [DISCONTINUED] Fluticasone-Umeclidin-Vilant (TRELEGY ELLIPTA) 100-62.5-25 MCG/INH AEPB Inhale 1 puff into the lungs daily.   Immunization History  Administered Date(s) Administered  . Influenza,inj,Quad PF,6+ Mos 06/02/2017  . Influenza-Unspecified 04/23/2019  . Moderna SARS-COVID-2 Vaccination 09/12/2019, 10/10/2019  . Pneumococcal Polysaccharide-23 06/02/2017  . Tdap 02/09/2018  . Zoster Recombinat (Shingrix) 05/21/2019, 10/19/2019      Objective:   Physical Exam BP 128/78 (BP Location: Left Arm, Cuff Size: Normal)   Pulse 63   Temp 97.7 F (36.5 C) (Temporal)   Ht 5\' 10"  (1.778 m)   Wt 206 lb 9.6 oz (93.7 kg)   SpO2 98%   BMI 29.64 kg/m  GENERAL: Awake, alert, fully ambulatory.  Flat affect. HEAD: Normocephalic, atraumatic.  EYES: Pupils equal, round, reactive to light.  No scleral icterus.  MOUTH: Nose/mouth/throat not examined due to masking requirements for COVID 19. NECK: Supple. No thyromegaly. Trachea midline. No JVD.  No adenopathy. PULMONARY: Good air entry bilaterally.  Coarse breath sounds, no  other adventitious sounds. CARDIOVASCULAR: S1 and S2. Regular rate and rhythm.  GASTROINTESTINAL: Protuberant abdomen, otherwise benign. MUSCULOSKELETAL: No joint deformity, no clubbing, no edema.  NEUROLOGIC: No overt focal deficits noted.  He seems inattentive.  When he speaks, speech is fluent. SKIN: Intact,warm,dry.  Limited exam no rashes. PSYCH: Flat affect, inattentive, otherwise calm and cooperative.  Recent Results (from the past 2160 hour(s))  CBC     Status: Abnormal   Collection Time: 02/01/20 11:10 AM  Result Value Ref Range   WBC 4.2 4.0 - 10.5 K/uL   RBC 4.19 (L) 4.22 - 5.81 MIL/uL   Hemoglobin 12.0 (L) 13.0 - 17.0 g/dL   HCT 38.6 (L) 39 - 52 %   MCV 92.1 80.0 - 100.0 fL   MCH 28.6 26.0 - 34.0 pg   MCHC 31.1 30.0 - 36.0  g/dL   RDW 14.8 11.5 - 15.5 %   Platelets 145 (L) 150 - 400 K/uL   nRBC 0.0 0.0 - 0.2 %    Comment: Performed at [facility], [address] [place], [place] [address]  Urinalysis, Complete w Microscopic     Status: Abnormal   Collection Time: 02/01/20 11:10 AM  Result Value Ref Range   Color, Urine YELLOW (A) YELLOW   APPearance HAZY (A) CLEAR   Specific Gravity, Urine 1.019 1.005 - 1.030   pH 5.0 5.0 - 8.0   Glucose, UA NEGATIVE NEGATIVE mg/dL   Hgb urine dipstick SMALL (A) NEGATIVE   Bilirubin Urine NEGATIVE NEGATIVE   Ketones, ur NEGATIVE NEGATIVE mg/dL   Protein, ur 30 (A) NEGATIVE mg/dL   Nitrite NEGATIVE NEGATIVE   Leukocytes,Ua NEGATIVE NEGATIVE   RBC / HPF 0-5 0 - 5 RBC/hpf   WBC, UA 0-5 0 - 5 WBC/hpf   Bacteria, UA NONE SEEN NONE SEEN   Squamous Epithelial / LPF 0-5 0 - 5   Mucus PRESENT    Hyaline Casts, UA PRESENT     Comment: Performed at [facility], [address] [place], [place] [address]  Comprehensive metabolic panel     Status: Abnormal   Collection Time: 02/01/20 11:10 AM  Result Value Ref Range   Sodium 140 135 - 145 mmol/L   Potassium 4.1 3.5 - 5.1 mmol/L   Chloride 106 98 - 111 mmol/L   CO2  24 22 - 32 mmol/L   Glucose, Bld 114 (H) 70 - 99 mg/dL    Comment: Glucose reference range applies only to samples taken after fasting for at least 8 hours.   BUN 22 8 - 23 mg/dL   Creatinine, Ser 1.14 0.61 - 1.24 mg/dL   Calcium 9.4 8.9 - 10.3 mg/dL   Total Protein 8.3 (H) 6.5 - 8.1 g/dL   Albumin 4.3 3.5 - 5.0 g/dL   AST 23 15 - 41 U/L   ALT 16 0 - 44 U/L   Alkaline Phosphatase 91 38 - 126 U/L   Total Bilirubin 0.8 0.3 - 1.2 mg/dL   GFR calc non Af Amer >60 >60 mL/min   GFR calc Af Amer >60 >60 mL/min   Anion gap 10 5 - 15    Comment: Performed at [facility], [place], [place] [address]  Troponin I (High Sensitivity)     Status: None   Collection Time: 02/01/20 11:10 AM  Result Value Ref Range   Troponin I (High Sensitivity) 10 <18 ng/L    Comment: (NOTE) Elevated high sensitivity troponin I (hsTnI) values and significant  changes across serial measurements may suggest ACS but many other  chronic and acute conditions are known to elevate hsTnI results.  Refer to the "Links" section for chest pain algorithms and additional  guidance. Performed at [facility], [place], [place] [address]   Troponin I (High Sensitivity)     Status: None   Collection Time: 02/01/20  2:47 PM  Result Value Ref Range   Troponin I (High Sensitivity) 11 <18 ng/L    Comment: (NOTE) Elevated high sensitivity troponin I (hsTnI) values and significant  changes across serial measurements may suggest ACS but many other  chronic and acute conditions are known to elevate hsTnI results.  Refer to the "Links" section for chest pain algorithms and additional  guidance. Performed at [facility], [address] [place], [place] [address]  SARS Coronavirus 2 by RT PCR (hospital order, performed in [facility] hospital lab) Nasopharyngeal Nasopharyngeal Swab     Status: None   Collection Time: 02/01/20  2:47 PM   Specimen: Nasopharyngeal Swab    Result Value Ref Range   SARS Coronavirus 2 NEGATIVE NEGATIVE    Comment: (NOTE) SARS-CoV-2 target nucleic acids are NOT DETECTED.  The SARS-CoV-2 RNA is generally detectable in upper and lower respiratory specimens during the acute phase of infection. The lowest concentration of SARS-CoV-2 viral copies this assay can detect is 250 copies / mL. A negative result does not preclude SARS-CoV-2 infection and should not be used as the sole basis for treatment or other patient management decisions.  A negative result may occur with improper specimen collection / handling, submission of specimen other than nasopharyngeal swab, presence of viral mutation(s) within the areas targeted by this assay, and inadequate number of viral copies (<250 copies / mL). A negative result must be combined with clinical observations, patient history, and epidemiological information.  Fact Sheet for Patients:   StrictlyIdeas.no  Fact Sheet for Healthcare Providers: BankingDealers.co.za  This test is not yet approved or  cleared by the [place] FDA and has been authorized for detection and/or diagnosis of SARS-CoV-2 by FDA under an Emergency Use Authorization (EUA).  This EUA will remain in effect (meaning this test can be used) for the duration of the COVID-19 declaration under Section 564(b)(1) of the Act, 21 U.S.C. section 360bbb-3(b)(1), unless the authorization is terminated or revoked sooner.  Performed at [facility], [address] [place], [place] [address]   Lipid panel     Status: Abnormal   Collection Time: 02/01/20  6:38 PM  Result Value Ref Range   Cholesterol 166 0 - 200 mg/dL   Triglycerides 79 <150 mg/dL   HDL 49 >40 mg/dL   Total CHOL/HDL Ratio 3.4 RATIO   VLDL 16 0 - 40 mg/dL   LDL Cholesterol 101 (H) 0 - 99 mg/dL    Comment:        Total Cholesterol/HDL:CHD Risk Coronary Heart Disease Risk Table                      Men   Women  1/2 Average Risk   3.4   3.3  Average Risk       5.0   4.4  2 X Average Risk   9.6   7.1  3 X Average Risk  23.4   11.0        Use the calculated Patient Ratio above and the CHD Risk Table to determine the patient's CHD Risk.        ATP III CLASSIFICATION (LDL):  <100     mg/dL   Optimal  100-129  mg/dL   Near or Above                    Optimal  130-159  mg/dL   Borderline  160-189  mg/dL   High  >190     mg/dL   Very High Performed at [address] [place], [place] [address]   Hemoglobin A1c     Status: Abnormal   Collection Time: 02/01/20  6:38 PM  Result Value Ref Range   Hgb A1c MFr Bld 5.7 (H) 4.8 - 5.6 %    Comment: (NOTE) Pre diabetes:          5.7%-6.4%  Diabetes:              >  6.4%  Glycemic control for   <7.0% adults with diabetes    Mean Plasma Glucose 116.89 mg/dL    Comment: Performed at [facility] [address] [place], [place] [address]  TSH     Status: None   Collection Time: 02/01/20  6:38 PM  Result Value Ref Range   TSH 2.454 0.350 - 4.500 uIU/mL    Comment: Performed by a 3rd Generation assay with a functional sensitivity of <=0.01 uIU/mL. Performed at [facility], [address] [place], [place] [address]   Magnesium     Status: None   Collection Time: 02/01/20  6:38 PM  Result Value Ref Range   Magnesium 1.9 1.7 - 2.4 mg/dL    Comment: Performed at [facility], [address] [place], [place] [address]  Brain natriuretic peptide     Status: Abnormal   Collection Time: 02/01/20  6:38 PM  Result Value Ref Range   B Natriuretic Peptide 293.0 (H) 0.0 - 100.0 pg/mL    Comment: Performed at [facility], [address] [place], [place] [address]  HIV Antibody (routine testing w rflx)     Status: None   Collection Time: 02/01/20  6:38 PM  Result Value Ref Range   HIV Screen 4th Generation wRfx Non Reactive Non Reactive    Comment: Performed at [facility], [address] [place], [place] [address]  ECHOCARDIOGRAM COMPLETE     Status: None   Collection Time: 02/01/20 11:29 PM  Result Value Ref Range   Weight 3,296 oz   Height 70 in   BP 170/88 mmHg   S' Lateral 3.08 cm   Area-P 1/2 3.97 cm2  Basic metabolic panel     Status: Abnormal   Collection Time: 02/02/20  4:18 AM  Result Value Ref Range   Sodium 140 135 - 145 mmol/L   Potassium 3.8 3.5 - 5.1 mmol/L   Chloride 107 98 - 111 mmol/L   CO2 25 22 - 32 mmol/L   Glucose, Bld 99 70 - 99 mg/dL    Comment: Glucose reference range applies only to samples taken after fasting for at least 8 hours.   BUN 16 8 - 23 mg/dL   Creatinine, Ser 1.01 0.61 - 1.24 mg/dL   Calcium 8.7 (L) 8.9 - 10.3 mg/dL   GFR calc non Af Amer >60 >60 mL/min   GFR calc Af Amer >60 >60 mL/min   Anion gap 8 5 - 15    Comment: Performed at [facility], [address] [place], [place] [address]  Glucose, capillary     Status: None   Collection Time: 02/12/20 11:10 AM  Result Value Ref Range   Glucose-Capillary 75 70 - 99 mg/dL    Comment: Glucose reference range applies only to samples taken after fasting for at least 8 hours.           Assessment & Plan:     ICD-10-CM   1. Lung nodule  R91.1    1.2 x 1.1 cm posterior LEFT upper lobe Abuts major fissure on the left Discussed options with the patient as noted below  2. Chronic obstructive pulmonary disease, unspecified COPD type ([place])  J44.9 Pulmonary Function Test [facility] Only   Will obtain PFTs Continue Symbicort and Incruse for now  3. Cerebrovascular accident (CVA), unspecified mechanism ([place])  I63.9    Patient requires anticoagulation due to prior CVA Need to a certain if it is safe to  withhold anticoagulation for procedure   4. Coronary artery disease involving native coronary artery of native heart without angina pectoris  I25.10    This issue adds complexity to his management Appears to be compensated in this regard   Orders Placed This  Encounter  Procedures  . Pulmonary Function Test [facility] Only    Standing Status:   Future    Standing Expiration Date:   02/12/2021    Order Specific Question:   Full PFT: includes the following: basic spirometry, spirometry pre & post bronchodilator, diffusion capacity (DLCO), lung volumes    Answer:   Full PFT   Discussion:  The patient has a small lung nodule which is FDG avid.  This is highly suspicious for malignancy.  We discussed possibilities for work-up as below:  Biopsy with navigational bronchoscopy, realizing that there will be sampling error due to the size of the nodule, procedure will have to be done under general anesthesia  Excisional biopsy by VATS/robotic surgery, this also would require general anesthesia  Treating the nodule without biopsy with SBRT if his lung function is prohibitive  Observing the behavior of the nodule, this is the least recommended route given that the nodule is FDG avid  For the patient to be safely put under general anesthesia several things need to be ascertained these would be a) how good is his lung function and be) is it safe to stop Eliquis for a few days from the stroke/CNS standpoint.  He has already been evaluated by cardiology and appears to have good cardiac function.  Will obtain PFTs to ascertain his pulmonary function.  He is to have evaluation by neurology on 17 September we will try to see if this appointment can be moved up so they can render an opinion with regards to his anticoagulation.  Patient will be seen in follow-up after the above studies are performed.  Patient was assured that there is time to work-up this lesion given its small size.   Renold Don, MD [facility]   *This note was dictated using voice recognition software/Dragon.  Despite best efforts to proofread, errors can occur which can change the meaning.  Any change was purely unintentional.

## 2020-02-13 NOTE — Patient Instructions (Addendum)
As we discussed today, there are for potential pathways to deal with this lung nodule:  Biopsy with navigational bronchoscopy (GPS) realizing that there will be some degree of sampling error due to the size of the nodule.  Procedure has to be done under general anesthesia.  Taking the nodule out, this would require surgery and also require general anesthesia  Treating the nodule without a biopsy with radiation  Observing the behavior of the nodule.  This this is not recommended due to the pet scan showing that this nodule "lights up"  To safely put him under general anesthesia several things need to be ascertained: How good is his lung function, is stopping the Eliquis for a few days okay from his prior stroke standpoint.  He has already been evaluated by the cardiologist and looks like he had good pump function of the heart.  We will see if we can move up his neurology appointment.  We need to get breathing tests.  We will see you after these studies are completed.

## 2020-02-15 ENCOUNTER — Encounter: Payer: Self-pay | Admitting: Pulmonary Disease

## 2020-02-19 ENCOUNTER — Ambulatory Visit (INDEPENDENT_AMBULATORY_CARE_PROVIDER_SITE_OTHER): Payer: Medicare Other | Admitting: Nurse Practitioner

## 2020-02-19 ENCOUNTER — Other Ambulatory Visit: Payer: Self-pay

## 2020-02-19 ENCOUNTER — Encounter: Payer: Self-pay | Admitting: Nurse Practitioner

## 2020-02-19 VITALS — BP 118/62 | HR 73 | Temp 98.1°F | Ht 70.0 in | Wt 210.0 lb

## 2020-02-19 DIAGNOSIS — M65331 Trigger finger, right middle finger: Secondary | ICD-10-CM | POA: Diagnosis not present

## 2020-02-19 DIAGNOSIS — R918 Other nonspecific abnormal finding of lung field: Secondary | ICD-10-CM

## 2020-02-19 DIAGNOSIS — I714 Abdominal aortic aneurysm, without rupture, unspecified: Secondary | ICD-10-CM

## 2020-02-19 DIAGNOSIS — R7303 Prediabetes: Secondary | ICD-10-CM | POA: Diagnosis not present

## 2020-02-19 NOTE — Patient Instructions (Signed)
For your trigger finger concerns: I have given you a referral to Orthopedics- you will get a phone call to set up an appt at [facility].   For the new aneurysm found on the Pet Scan- I have given you referral to vascular so they may follow this.  You will get a phone call [facility] to set up an appointment.  Continue to cut back on marijuana vaping.  It is a good thing that he stopped cigarette smoking in, please do not restart.  If you find that marijuana is treating anxiety or stress please let me know with an appropriate medication.  Continue with your plans to join a gym as exercise is good for stress reduction and strengthening the heart and lungs.  We discussed prediabetes today.  We discussed cutting back on the sugar content in your diet.  I would substitute the apple juice with something that has a no added sugar.  Sugar substitute in coffee.  I would cut back on the cookies and M&Ms, and focus on increased vegetables, use fruits as desert, lean meats, nonfat dairy, and high-fiber carbohydrates.  See the low glycemic index diet sheet.   You  would like a podiatry referral for chronic great toe nail changes, but will hold on that right now since you have so many referrals already ordered.   Please follow-up office visit in 1 month.

## 2020-02-19 NOTE — Progress Notes (Signed)
Established Patient Office Visit  Subjective:  Patient ID: Richard Wiggins, male    DOB: [birth year]  Age: 69 y.o. MRN: [id number]  CC:  Chief Complaint  Patient presents with  . Follow-up    HPI Richard Wiggins is a 69 yo with history of HTN, CVA 2018, PE- provoked at time of hospitalization for CVA, brain aneurysm status post clipping, CHF, paroxysmal atrial fibrillation on Eliquis, CAD s/p PCI to RCA 2018 (LAD 50%, LCx20%), carotid stenosis s/p CEA 2018, hyperlipidemia, BPH, COPD, anemia, OSA, depression, previously on Lexapro who established care last month after hospital admission. He is here for a 2 week follow up.   His main concerns today are his trigger finger.  He would like referral to orthopedics for a steroid injection.  He would also like referral to podiatry states great toenails are discolored and he would like treatment for fungus.  He has set up for pulmonary function studies next week.  He is getting worked up for pulmonary nodules followed by nurse Jeanine Luz  and Dr. Patsey Berthold.  There is discussion about general anesthesia for lung biopsy.  He vapes marijuana 2-3 times a week. Plans to get the Neuro appt moved up.  The PET scan 02/12/3020 also found a new AAA:  IMPRESSION: 1. The spiculated nodule posteriorly in the left upper lobe is hypermetabolic and remains suspicious for primary bronchogenic carcinoma. No evidence of metastatic disease. By this study, findings are most consistent with stage IA lung cancer (T1b N0 M0). 2. Tiny right lung nodules are too small to characterize, but likely benign. 3. 4.3 cm abdominal aortic aneurysm. Recommend follow-up every 12 months and vascular consultation. This recommendation follows ACR consensus guidelines: White Paper of the ACR Incidental Findings Committee II on Vascular Findings. J Am Coll Radiol 2013; 10:789-794. 4. Additional incidental findings including sigmoid diverticulosis, Aortic Atherosclerosis  (ICD10-I70.0) and Emphysema (ICD10-J43.9). .   Pre diabetes/ BMI 30/Obesity : Plans to go into the gym with his wife. He drinks apple juice every morning, sugar in coffee, and has a sweet tooth- eats cookies and [employer] candy.   Lab Results  Component Value Date   HGBA1C 5.7 (H) 02/01/2020   Colonoscopy > 10 years in [facility].   Past Medical History:  Diagnosis Date  . 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 Anemia 02/09/2020  . Benign essential HTN 02/10/2020  . BPH (benign prostatic hyperplasia) 02/09/2020  . Brain aneurysm   . Chronic kidney disease   . COPD (chronic obstructive pulmonary disease) ([place])   . History of common carotid artery stent placement   . Hypertension   . IDA (iron deficiency anemia) 02/09/2020  . 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 Marijuana use, continuous 02/10/2020  . Pulmonary emboli ([place]) 06/07/2017   Right lower lobe pulmonary embolism small segmental, multifocal multifocal pneumonia, mediastinal lymphadenopathy, moderate centrilobular emphysema  . Stroke [facility])     Past Surgical History:  Procedure Laterality Date  . brain aneurysm with clip    . CORONARY ANGIOPLASTY WITH STENT PLACEMENT  07/08/2017  . hip repleacement Right    Right total hip arthroplasty    Family History  Problem Relation Age of Onset  . Diabetes Mother   . Heart disease Mother   . Stroke Father   . Heart disease Father   . Heart attack Father   . Alcohol abuse Father   . Heart disease Sister   .  Heart attack Sister   . Heart disease Maternal Grandmother   . Heart attack Maternal Grandmother   . Heart attack Paternal Grandmother   . Diabetes Brother   . Heart disease Brother   . Heart attack Brother     Social History   Socioeconomic History  . Marital status: Married    Spouse name: Not on file  . Number of children: Not on file  . Years of education: Not on file  . Highest  education level: High school graduate  Occupational History  . Occupation: Retired  Tobacco Use  . Smoking status: Former Smoker    Packs/day: 2.00    Years: 53.00    Pack years: 106.00    Types: Cigarettes    Quit date: 05/28/2017    Years since quitting: 2.7  . Smokeless tobacco: Never Used  Vaping Use  . Vaping Use: Some days  . Devices: cbd   Substance and Sexual Activity  . Alcohol use: Yes  . Drug use: Never  . Sexual activity: Not Currently  Other Topics Concern  . Not on file  Social History Narrative   Lives with wife. 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 Children x2 children and grandchildren grown.    Social Determinants of Health   Financial Resource Strain:   . Difficulty of Paying Living Expenses:   Food Insecurity:   . Worried About Charity fundraiser in the Last Year:   . Arboriculturist in the Last Year:   Transportation Needs:   . Film/video editor (Medical):   [name] Lack of Transportation (Non-Medical):   Physical Activity:   . Days of Exercise per Week:   . Minutes of Exercise per Session:   Stress:   . Feeling of Stress :   Social Connections:   . Frequency of Communication with Friends and Family:   . Frequency of Social Gatherings with Friends and Family:   . Attends Religious Services:   . Active Member of Clubs or Organizations:   . Attends Archivist Meetings:   [name] Marital Status:   Intimate Partner Violence:   . Fear of Current or Ex-Partner:   . Emotionally Abused:   [name] Physically Abused:   . Sexually Abused:     Outpatient Medications Prior to Visit  Medication Sig Dispense Refill  . albuterol (VENTOLIN HFA) 108 (90 Base) MCG/ACT inhaler Inhale 2 puffs into the lungs every 4 (four) hours as needed.    [name] apixaban (ELIQUIS) 5 MG TABS tablet Take 5 mg by mouth in the morning and at bedtime.    [name] aspirin EC 81 MG tablet Take 81 mg by mouth daily. Swallow whole.    [name] atorvastatin (LIPITOR) 40 MG tablet Take 1 tablet  (40 mg total) by mouth at bedtime. 30 tablet 0  . budesonide-formoterol (SYMBICORT) 160-4.5 MCG/ACT inhaler Inhale 2 puffs into the lungs 2 (two) times daily.    . carvedilol (COREG) 25 MG tablet Take 12.5 mg by mouth in the morning and at bedtime.    . furosemide (LASIX) 20 MG tablet Take 1-2 tablets by mouth daily as needed for fluid.    [name] ipratropium-albuterol (DUONEB) 0.5-2.5 (3) MG/3ML SOLN Inhale 3 mLs into the lungs every 6 (six) hours as needed.    . sacubitril-valsartan (ENTRESTO) 49-51 MG Take 0.5 tablets by mouth in the morning and at bedtime.    [name] spironolactone (ALDACTONE) 25 MG tablet Take 12.5 mg by mouth at bedtime.    [name]  tamsulosin (FLOMAX) 0.4 MG CAPS capsule Take 1 capsule (0.4 mg total) by mouth daily. 30 capsule 0  . umeclidinium bromide (INCRUSE ELLIPTA) 62.5 MCG/INH AEPB Inhale 1 puff into the lungs daily.     No facility-administered medications prior to visit.    No Known Allergies  Review of Systems Pertinent positives noted in history of present illness otherwise negative.   Objective:    Physical Exam Vitals reviewed.  Constitutional:      Appearance: Normal appearance.  HENT:     Head: Normocephalic.  Cardiovascular:     Rate and Rhythm: Normal rate and regular rhythm.     Pulses: Normal pulses.     Heart sounds: Normal heart sounds.  Pulmonary:     Effort: Pulmonary effort is normal.     Breath sounds: Normal breath sounds.  Abdominal:     Palpations: Abdomen is soft.     Tenderness: There is no abdominal tenderness.  Musculoskeletal:        General: Normal range of motion.     Right lower leg: No edema.     Left lower leg: No edema.     Comments: Trigger  finger right 3rd finger - DIP joint  Skin:    General: Skin is warm and dry.  Neurological:     General: No focal deficit present.     Mental Status: He is alert and oriented to person, place, and time.  Psychiatric:        Mood and Affect: Mood normal.        Behavior: Behavior normal.      Comments: Much more talkative and participating actively in  the appt today.      BP 118/62 (BP Location: Left Arm, Patient Position: Sitting, Cuff Size: Normal)   Pulse 73   Temp 98.1 F (36.7 C) (Oral)   Ht 5\' 10"  (1.778 m)   Wt 210 lb (95.3 kg)   SpO2 99%   BMI 30.13 kg/m  Wt Readings from Last 3 Encounters:  02/19/20 210 lb (95.3 kg)  02/13/20 206 lb 9.6 oz (93.7 kg)  02/07/20 (!) 207 lb 12.8 oz (94.3 kg)     Health Maintenance Due  Topic Date Due  . Hepatitis C Screening  Never done  . COLONOSCOPY  Never done  . PNA vac Low Risk Adult (2 of 2 - PCV13) 06/02/2018    There are no preventive care reminders to display for this patient.  Lab Results  Component Value Date   TSH 2.454 02/01/2020   Lab Results  Component Value Date   WBC 4.2 02/01/2020   HGB 12.0 (L) 02/01/2020   HCT 38.6 (L) 02/01/2020   MCV 92.1 02/01/2020   PLT 145 (L) 02/01/2020   Lab Results  Component Value Date   NA 140 02/02/2020   K 3.8 02/02/2020   CO2 25 02/02/2020   GLUCOSE 99 02/02/2020   BUN 16 02/02/2020   CREATININE 1.01 02/02/2020   BILITOT 0.8 02/01/2020   ALKPHOS 91 02/01/2020   AST 23 02/01/2020   ALT 16 02/01/2020   PROT 8.3 (H) 02/01/2020   ALBUMIN 4.3 02/01/2020   CALCIUM 8.7 (L) 02/02/2020   ANIONGAP 8 02/02/2020   Lab Results  Component Value Date   CHOL 166 02/01/2020   Lab Results  Component Value Date   HDL 49 02/01/2020   Lab Results  Component Value Date   LDLCALC 101 (H) 02/01/2020   Lab Results  Component Value Date   TRIG  79 02/01/2020   Lab Results  Component Value Date   CHOLHDL 3.4 02/01/2020   Lab Results  Component Value Date   HGBA1C 5.7 (H) 02/01/2020      Assessment & Plan:   Problem List Items Addressed This Visit      Cardiovascular and Mediastinum   Abdominal aortic aneurysm (AAA) without rupture [facility])   Relevant Orders   Ambulatory referral to Vascular Surgery     Musculoskeletal and Integument   Trigger middle  finger of right hand - Primary   Relevant Orders   Ambulatory referral to Orthopedic Surgery     Other   Indeterminate pulmonary nodules   Pre-diabetes      No orders of the defined types were placed in this encounter. For your trigger finger concerns: I have given you a referral to Orthopedics- you will get a phone call to set up an appt at [facility].   For the new aneurysm found on the Pet Scan- I have given you referral to vascular so they may follow this.  You will get a phone call [facility] to set up an appointment.  Continue to cut back on marijuana vaping.  It is a good thing that he stopped cigarette smoking in, please do not restart.  If you find that marijuana is treating anxiety or stress please let me know with an appropriate medication.  Continue with your plans to join a gym as exercise is good for stress reduction and strengthening the heart and lungs.  We discussed prediabetes today.  We discussed cutting back on the sugar content in your diet.  I would substitute the apple juice with something that has a no added sugar.  Sugar substitute in coffee.  I would cut back on the cookies and M&Ms, and focus on increased vegetables, use fruits as desert, lean meats, nonfat dairy, and high-fiber carbohydrates.  See the low glycemic index diet sheet.   You  would like a podiatry referral for chronic great toe nail changes, but will hold on that right now since you have so many referrals already ordered.   Please follow-up office visit in 1 month.   I provided  35 minutes of non-face-to-face time during this encounter reviewing patient's current problems and specialist notes, medications, labs and imaging studies, providing counseling on the above mentioned problems , and coordination  of care .  Follow-up: Return in about 1 month (around 03/21/2020).  This visit occurred during the SARS-CoV-2 public health emergency.  Safety protocols were in place, including  screening questions prior to the visit, additional usage of staff PPE, and extensive cleaning of exam room while observing appropriate contact time as indicated for disinfecting solutions.    Denice Paradise, NP

## 2020-02-20 ENCOUNTER — Encounter: Payer: Self-pay | Admitting: Nurse Practitioner

## 2020-02-20 DIAGNOSIS — M65331 Trigger finger, right middle finger: Secondary | ICD-10-CM | POA: Insufficient documentation

## 2020-02-20 DIAGNOSIS — I714 Abdominal aortic aneurysm, without rupture, unspecified: Secondary | ICD-10-CM | POA: Insufficient documentation

## 2020-02-22 ENCOUNTER — Telehealth: Payer: Self-pay

## 2020-02-22 NOTE — Telephone Encounter (Signed)
Left message x2 for pt. 

## 2020-02-22 NOTE — Telephone Encounter (Signed)
Left message to relay date/time of covid test prior to PFT.   [facility]

## 2020-02-25 ENCOUNTER — Other Ambulatory Visit
Admission: RE | Admit: 2020-02-25 | Discharge: 2020-02-25 | Disposition: A | Discharge status: Home / Self Care | Payer: Medicare Other | Source: Ambulatory Visit | Attending: Pulmonary Disease | Admitting: Pulmonary Disease

## 2020-02-25 ENCOUNTER — Other Ambulatory Visit: Payer: Self-pay

## 2020-02-25 DIAGNOSIS — Z20822 Contact with and (suspected) exposure to covid-19: Secondary | ICD-10-CM | POA: Insufficient documentation

## 2020-02-25 DIAGNOSIS — Z01812 Encounter for preprocedural laboratory examination: Secondary | ICD-10-CM | POA: Diagnosis not present

## 2020-02-25 LAB — SARS CORONAVIRUS 2 (TAT 6-24 HRS): SARS Coronavirus 2: NEGATIVE

## 2020-02-25 NOTE — Telephone Encounter (Signed)
Left message x3 for patient.  Will close encounter per office protocol.

## 2020-02-26 ENCOUNTER — Ambulatory Visit: Payer: Medicare Other | Attending: Pulmonary Disease

## 2020-02-26 DIAGNOSIS — Z87891 Personal history of nicotine dependence: Secondary | ICD-10-CM | POA: Diagnosis not present

## 2020-02-26 DIAGNOSIS — J449 Chronic obstructive pulmonary disease, unspecified: Secondary | ICD-10-CM | POA: Diagnosis not present

## 2020-02-26 DIAGNOSIS — Z7951 Long term (current) use of inhaled steroids: Secondary | ICD-10-CM | POA: Insufficient documentation

## 2020-02-26 LAB — PULMONARY FUNCTION TEST ARMC ONLY
DL/VA % pred: 45 %
DL/VA: 1.85 ml/min/mmHg/L
DLCO unc % pred: 41 %
DLCO unc: 10.78 ml/min/mmHg
FEF 25-75 Post: 1.16 L/sec
FEF 25-75 Pre: 0.81 L/sec
FEF2575-%Change-Post: 42 %
FEF2575-%Pred-Post: 46 %
FEF2575-%Pred-Pre: 32 %
FEV1-%Change-Post: 10 %
FEV1-%Pred-Post: 63 %
FEV1-%Pred-Pre: 56 %
FEV1-Post: 2.06 L
FEV1-Pre: 1.85 L
FEV1FVC-%Change-Post: 8 %
FEV1FVC-%Pred-Pre: 79 %
FEV6-%Change-Post: 4 %
FEV6-%Pred-Post: 76 %
FEV6-%Pred-Pre: 72 %
FEV6-Post: 3.18 L
FEV6-Pre: 3.04 L
FEV6FVC-%Change-Post: 4 %
FEV6FVC-%Pred-Post: 105 %
FEV6FVC-%Pred-Pre: 101 %
FVC-%Change-Post: 2 %
FVC-%Pred-Post: 73 %
FVC-%Pred-Pre: 71 %
FVC-Post: 3.26 L
Post FEV1/FVC ratio: 63 %
Post FEV6/FVC ratio: 100 %
Pre FEV1/FVC ratio: 58 %
Pre FEV6/FVC Ratio: 96 %
RV % pred: 206 %
RV: 5.05 L
TLC % pred: 117 %
TLC: 8.26 L

## 2020-02-26 MED ORDER — ALBUTEROL SULFATE (2.5 MG/3ML) 0.083% IN NEBU
2.5000 mg | INHALATION_SOLUTION | Freq: Once | RESPIRATORY_TRACT | Status: AC
  Administered 2020-02-26: 2.5 mg via RESPIRATORY_TRACT
  Filled 2020-02-26: qty 3

## 2020-03-03 ENCOUNTER — Other Ambulatory Visit: Payer: Self-pay | Admitting: Nurse Practitioner

## 2020-03-03 ENCOUNTER — Telehealth: Payer: Self-pay | Admitting: Nurse Practitioner

## 2020-03-03 DIAGNOSIS — M65331 Trigger finger, right middle finger: Secondary | ICD-10-CM | POA: Diagnosis not present

## 2020-03-03 MED ORDER — TAMSULOSIN HCL 0.4 MG PO CAPS: 0.4000 mg | ORAL_CAPSULE | Freq: Every day | ORAL | 0 refills | Status: DC

## 2020-03-03 MED ORDER — ATORVASTATIN CALCIUM 40 MG PO TABS: 40.0000 mg | ORAL_TABLET | Freq: Every day | ORAL | 0 refills | Status: DC

## 2020-03-03 NOTE — Telephone Encounter (Signed)
Pt needs refill on tamsulosin (FLOMAX) 0.4 MG CAPS capsule and atorvastatin (LIPITOR) 40 MG tablet sent to [facility]

## 2020-03-05 ENCOUNTER — Encounter: Payer: Self-pay | Admitting: *Deleted

## 2020-03-05 NOTE — Progress Notes (Signed)
  Oncology Nurse Navigator Documentation  Navigator Location: [facility] (03/05/20 1000)   )Navigator Encounter Type: Telephone (03/05/20 1000) Telephone: [facility] Call (03/05/20 1000)                       Barriers/Navigation Needs: Coordination of Care (03/05/20 1000)   Interventions: Coordination of Care (03/05/20 1000)   Coordination of Care: Appts (03/05/20 1000)         spoke with Nevin Bloodgood at [facility] neurology to see if pt's appt on 9/17 could be moved sooner so pt's care is not delayed. New appt for neurology given for 03/19/20 at 2:30pm. Pt's wife made aware of appt. All questions answered during call. Instructed to call back with any further questions or needs. Pt's wife verbalized understanding. Will continue to follow.          Time Spent with Patient: 30 (03/05/20 1000)

## 2020-03-14 ENCOUNTER — Ambulatory Visit (INDEPENDENT_AMBULATORY_CARE_PROVIDER_SITE_OTHER): Payer: Medicare Other | Admitting: Vascular Surgery

## 2020-03-14 ENCOUNTER — Other Ambulatory Visit: Payer: Self-pay

## 2020-03-14 ENCOUNTER — Encounter (INDEPENDENT_AMBULATORY_CARE_PROVIDER_SITE_OTHER): Payer: Self-pay | Admitting: Vascular Surgery

## 2020-03-14 VITALS — BP 148/83 | HR 71 | Ht 70.0 in | Wt 204.0 lb

## 2020-03-14 DIAGNOSIS — I10 Essential (primary) hypertension: Secondary | ICD-10-CM

## 2020-03-14 DIAGNOSIS — I714 Abdominal aortic aneurysm, without rupture, unspecified: Secondary | ICD-10-CM

## 2020-03-14 DIAGNOSIS — I70213 Atherosclerosis of native arteries of extremities with intermittent claudication, bilateral legs: Secondary | ICD-10-CM

## 2020-03-14 DIAGNOSIS — I70219 Atherosclerosis of native arteries of extremities with intermittent claudication, unspecified extremity: Secondary | ICD-10-CM | POA: Insufficient documentation

## 2020-03-14 DIAGNOSIS — E785 Hyperlipidemia, unspecified: Secondary | ICD-10-CM

## 2020-03-14 NOTE — Patient Instructions (Signed)
Abdominal Aortic Aneurysm  An aneurysm is a bulge in one of the blood vessels that carry blood away from the heart (artery). It happens when blood pushes up against a weak or damaged place in the wall of an artery. An abdominal aortic aneurysm happens in the main artery of the body (aorta). Some aneurysms may not cause problems. If it grows, it can burst or tear, causing bleeding inside the body. This is an emergency. It needs to be treated right away. What are the causes? The exact cause of this condition is not known. What increases the risk? The following may make you more likely to get this condition:  Being a male who is 70 years of age or older.  Being white (Caucasian).  Using tobacco.  Having a family history of aneurysms.  Having the following conditions: ? Hardening of the arteries (arteriosclerosis). ? Inflammation of the walls of an artery (arteritis). ? Certain genetic conditions. ? Being very overweight (obesity). ? An infection in the wall of the aorta (infectious aortitis). ? High cholesterol. ? High blood pressure (hypertension). What are the signs or symptoms? Symptoms depend on the size of the aneurysm and how fast it is growing. Most grow slowly and do not cause any symptoms. If symptoms do occur, they may include:  Pain in the belly (abdomen), side, or back.  Feeling full after eating only small amounts of food.  Feeling a throbbing lump in the belly. Symptoms that the aneurysm has burst (ruptured) include:  Sudden, very bad pain in the belly, side, or back.  Feeling sick to your stomach (nauseous).  Throwing up (vomiting).  Feeling light-headed or passing out. How is this treated? Treatment for this condition depends on:  The size of the aneurysm.  How fast it is growing.  Your age.  Your risk of having it burst. If your aneurysm is smaller than 2 inches (5 cm), your doctor may manage it by:  Checking it often to see if it is getting bigger.  You may have an imaging test (ultrasound) to check it every 3-6 months, every year, or every few years.  Giving you medicines to: ? Control blood pressure. ? Treat pain. ? Fight infection. If your aneurysm is larger than 2 inches (5 cm), you may need surgery to fix it. Follow these instructions at home: Lifestyle  Do not use any products that have nicotine or tobacco in them. This includes cigarettes, e-cigarettes, and chewing tobacco. If you need help quitting, ask your doctor.  Get regular exercise. Ask your doctor what types of exercise are best for you. Eating and drinking  Eat a heart-healthy diet. This includes eating plenty of: ? Fresh fruits and vegetables. ? Whole grains. ? Low-fat (lean) protein. ? Low-fat dairy products.  Avoid foods that are high in saturated fat and cholesterol. These foods include red meat and some dairy products.  Do not drink alcohol if: ? Your doctor tells you not to drink. ? You are pregnant, may be pregnant, or are planning to become pregnant.  If you drink alcohol: ? Limit how much you use to:  0-1 drink a day for women.  0-2 drinks a day for men. ? Be aware of how much alcohol is in your drink. In the [place] one drink equals any of these:  One typical bottle of beer (12 oz).  One-half glass of wine (5 oz).  One shot of hard liquor (1 oz). General instructions  Take over-the-counter and prescription medicines only as  told by your doctor.  Keep your blood pressure within normal limits. Ask your doctor what your blood pressure should be.  Have your blood sugar (glucose) level and cholesterol levels checked regularly. Keep your blood sugar level and cholesterol levels within normal limits.  Avoid heavy lifting and activities that take a lot of effort. Ask your doctor what activities are safe for you.  Keep all follow-up visits as told by your doctor. This is important. ? Talk to your doctor about regular screenings to see if the  aneurysm is getting bigger. Contact a doctor if you:  Have pain in your belly, side, or back.  Have a throbbing feeling in your belly.  Have a family history of aneurysms. Get help right away if you:  Have sudden, bad pain in your belly, side, or back.  Feel sick to your stomach.  Throw up.  Have trouble pooping (constipation).  Have trouble peeing (urinating).  Feel light-headed.  Have a fast heart rate when you stand.  Have sweaty skin that is cold to the touch (clammy).  Have shortness of breath.  Have a fever. These symptoms may be an emergency. Do not wait to see if the symptoms will go away. Get medical help right away. Call your local emergency services (911 in the [place] Do not drive yourself to the hospital. Summary  An aneurysm is a bulge in one of the blood vessels that carry blood away from the heart (artery). Some aneurysms may not cause problems.  You may need to have yours checked often. If it grows, it can burst or tear. This causes bleeding inside the body. It needs to be treated right away.  Follow instructions from your doctor about healthy lifestyle changes.  Keep all follow-up visits as told by your doctor. This is important. This information is not intended to replace advice given to you by your health care provider. Make sure you discuss any questions you have with your health care provider. Document Revised: 10/16/2018 Document Reviewed: 02/04/2018 Elsevier Patient Education  [employer].

## 2020-03-14 NOTE — Progress Notes (Signed)
Patient ID: Richard Wiggins, male   DOB: 01-Oct-1950, 69 y.o.   MRN: [id number]  Chief Complaint  Patient presents with  . New Patient (Initial Visit)    AAA    HPI Richard Wiggins is a 69 y.o. male.  I am asked to see the patient by K. Jerelene Redden for evaluation of an AAA.  The patient has no symptoms referable to his aneurysm. Specifically, the patient denies new back or abdominal pain, or signs of peripheral embolization.  He actually had a 3.5 cm aneurysm known by his wife last year although the patient was unaware of this.  He has had a brain aneurysm and now with a new malignancy, much going on.  He underwent a PET scan that I have independently reviewed.  This does demonstrate an approximately 4.3 cm infrarenal abdominal aortic aneurysm.  This was not a contrasted study so it is difficult to assess perfusion, but significant aortoiliac calcification is present as well.  He and his wife do describe leg pain and tiredness with activity worrisome for claudication symptoms.  His wife provides much of the history as he has had some issues after his stroke   Past Medical History:  Diagnosis Date  . AAA (abdominal aortic aneurysm) ([place]) 2020   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 -plan medical managmment with tight bp control  . Anemia 02/09/2020  . Benign essential HTN 02/10/2020  . BPH (benign prostatic hyperplasia) 02/09/2020  . Brain aneurysm   . Chronic kidney disease   . COPD (chronic obstructive pulmonary disease) ([place])   . History of common carotid artery stent placement   . Hypertension   . IDA (iron deficiency anemia) 02/09/2020  . 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 Marijuana use, continuous 02/10/2020  . Pulmonary emboli ([place]) 06/07/2017   Right lower lobe pulmonary embolism small segmental, multifocal multifocal pneumonia, mediastinal lymphadenopathy, moderate centrilobular emphysema  . Stroke [facility])     Past Surgical History:  Procedure Laterality Date  . brain aneurysm with  clip    . CORONARY ANGIOPLASTY WITH STENT PLACEMENT  07/08/2017  . hip repleacement Right    Right total hip arthroplasty     Family History  Problem Relation Age of Onset  . Diabetes Mother   . Heart disease Mother   . Stroke Father   . Heart disease Father   . Heart attack Father   . Alcohol abuse Father   . Heart disease Sister   . Heart attack Sister   . Heart disease Maternal Grandmother   . Heart attack Maternal Grandmother   . Heart attack Paternal Grandmother   . Diabetes Brother   . Heart disease Brother   . Heart attack Brother       Social History   Tobacco Use  . Smoking status: Former Smoker    Packs/day: 2.00    Years: 53.00    Pack years: 106.00    Types: Cigarettes    Quit date: 05/28/2017    Years since quitting: 2.7  . Smokeless tobacco: Never Used  Vaping Use  . Vaping Use: Some days  . Devices: cbd   Substance Use Topics  . Alcohol use: Yes  . Drug use: Never     No Known Allergies  Current Outpatient Medications  Medication Sig Dispense Refill  . albuterol (VENTOLIN HFA) 108 (90 Base) MCG/ACT inhaler Inhale 2 puffs into the lungs every 4 (four) hours as needed.    [name] apixaban (ELIQUIS) 5 MG TABS tablet Take 5  mg by mouth in the morning and at bedtime.    [name] aspirin EC 81 MG tablet Take 81 mg by mouth daily. Swallow whole.    [name] atorvastatin (LIPITOR) 40 MG tablet TAKE 1 TABLET(40 MG) BY MOUTH AT BEDTIME 90 tablet 0  . budesonide-formoterol (SYMBICORT) 160-4.5 MCG/ACT inhaler Inhale 2 puffs into the lungs 2 (two) times daily.    . carvedilol (COREG) 25 MG tablet Take 12.5 mg by mouth in the morning and at bedtime.    . furosemide (LASIX) 20 MG tablet Take 1-2 tablets by mouth daily as needed for fluid.    [name] ipratropium-albuterol (DUONEB) 0.5-2.5 (3) MG/3ML SOLN Inhale 3 mLs into the lungs every 6 (six) hours as needed.    . sacubitril-valsartan (ENTRESTO) 49-51 MG Take 0.5 tablets by mouth in the morning and at bedtime.    [name] spironolactone  (ALDACTONE) 25 MG tablet Take 12.5 mg by mouth at bedtime.    . tamsulosin (FLOMAX) 0.4 MG CAPS capsule TAKE 1 CAPSULE(0.4 MG) BY MOUTH DAILY 90 capsule 0  . umeclidinium bromide (INCRUSE ELLIPTA) 62.5 MCG/INH AEPB Inhale 1 puff into the lungs daily.     No current facility-administered medications for this visit.      REVIEW OF SYSTEMS (Negative unless checked)  Constitutional: [] Weight loss  [] Fever  [] Chills Cardiac: [] Chest pain   [] Chest pressure   [] Palpitations   [] Shortness of breath when laying flat   [] Shortness of breath at rest   [] Shortness of breath with exertion. Vascular:  [x] Pain in legs with walking   [] Pain in legs at rest   [] Pain in legs when laying flat   [x] Claudication   [] Pain in feet when walking  [] Pain in feet at rest  [] Pain in feet when laying flat   [] History of DVT   [] Phlebitis   [] Swelling in legs   [] Varicose veins   [] Non-healing ulcers Pulmonary:   [] Uses home oxygen   [] Productive cough   [] Hemoptysis   [] Wheeze  [] COPD   [] Asthma Neurologic:  [] Dizziness  [] Blackouts   [] Seizures   [] History of stroke   [] History of TIA  [] Aphasia   [] Temporary blindness   [] Dysphagia   [] Weakness or numbness in arms   [] Weakness or numbness in legs Musculoskeletal:  [x] Arthritis   [] Joint swelling   [x] Joint pain   [] Low back pain Hematologic:  [] Easy bruising  [] Easy bleeding   [] Hypercoagulable state   [] Anemic  [] Hepatitis Gastrointestinal:  [] Blood in stool   [] Vomiting blood  [] Gastroesophageal reflux/heartburn   [] Abdominal pain Genitourinary:  [] Chronic kidney disease   [] Difficult urination  [] Frequent urination  [] Burning with urination   [] Hematuria Skin:  [] Rashes   [] Ulcers   [] Wounds Psychological:  [] History of anxiety   []  History of major depression.    Physical Exam BP (!) 148/83   Pulse 71   Ht 5\' 10"  (1.778 m)   Wt 204 lb (92.5 kg)   BMI 29.27 kg/m  Gen:  WD/WN, NAD Head: [place]/AT, No temporalis wasting. Ear/Nose/Throat: Hearing grossly intact,  nares w/o erythema or drainage, oropharynx w/o Erythema/Exudate Eyes: Conjunctiva clear, sclera non-icteric  Neck: trachea midline.  No JVD.  Pulmonary:  Good air movement, respirations not labored, no use of accessory muscles  Cardiac: RRR, no JVD Vascular:  Vessel Right Left  Radial Palpable Palpable                          DP trace 1+  PT 1+ NP  Gastrointestinal:. No masses, surgical incisions, or scars.  Increased aortic impulse Musculoskeletal: M/S 5/5 throughout.  Extremities without ischemic changes.  No deformity or atrophy. Trace LE edema. Neurologic: Sensation grossly intact in extremities.  Symmetrical.  Speech is fluent. Motor exam as listed above. Psychiatric: Judgment intact, Mood & affect appropriate for pt's clinical situation. Dermatologic: No rashes or ulcers noted.  No cellulitis or open wounds.    Radiology No results found.  Labs Recent Results (from the past 2160 hour(s))  CBC     Status: Abnormal   Collection Time: 02/01/20 11:10 AM  Result Value Ref Range   WBC 4.2 4.0 - 10.5 K/uL   RBC 4.19 (L) 4.22 - 5.81 MIL/uL   Hemoglobin 12.0 (L) 13.0 - 17.0 g/dL   HCT 38.6 (L) 39 - 52 %   MCV 92.1 80.0 - 100.0 fL   MCH 28.6 26.0 - 34.0 pg   MCHC 31.1 30.0 - 36.0 g/dL   RDW 14.8 11.5 - 15.5 %   Platelets 145 (L) 150 - 400 K/uL   nRBC 0.0 0.0 - 0.2 %    Comment: Performed at [facility], [address] [place], [place] [address]  Urinalysis, Complete w Microscopic     Status: Abnormal   Collection Time: 02/01/20 11:10 AM  Result Value Ref Range   Color, Urine YELLOW (A) YELLOW   APPearance HAZY (A) CLEAR   Specific Gravity, Urine 1.019 1.005 - 1.030   pH 5.0 5.0 - 8.0   Glucose, UA NEGATIVE NEGATIVE mg/dL   Hgb urine dipstick SMALL (A) NEGATIVE   Bilirubin Urine NEGATIVE NEGATIVE   Ketones, ur NEGATIVE NEGATIVE mg/dL   Protein, ur 30 (A) NEGATIVE mg/dL   Nitrite NEGATIVE NEGATIVE   Leukocytes,Ua NEGATIVE NEGATIVE   RBC / HPF 0-5 0 -  5 RBC/hpf   WBC, UA 0-5 0 - 5 WBC/hpf   Bacteria, UA NONE SEEN NONE SEEN   Squamous Epithelial / LPF 0-5 0 - 5   Mucus PRESENT    Hyaline Casts, UA PRESENT     Comment: Performed at [facility], [address] [place], [place] [address]  Comprehensive metabolic panel     Status: Abnormal   Collection Time: 02/01/20 11:10 AM  Result Value Ref Range   Sodium 140 135 - 145 mmol/L   Potassium 4.1 3.5 - 5.1 mmol/L   Chloride 106 98 - 111 mmol/L   CO2 24 22 - 32 mmol/L   Glucose, Bld 114 (H) 70 - 99 mg/dL    Comment: Glucose reference range applies only to samples taken after fasting for at least 8 hours.   BUN 22 8 - 23 mg/dL   Creatinine, Ser 1.14 0.61 - 1.24 mg/dL   Calcium 9.4 8.9 - 10.3 mg/dL   Total Protein 8.3 (H) 6.5 - 8.1 g/dL   Albumin 4.3 3.5 - 5.0 g/dL   AST 23 15 - 41 U/L   ALT 16 0 - 44 U/L   Alkaline Phosphatase 91 38 - 126 U/L   Total Bilirubin 0.8 0.3 - 1.2 mg/dL   GFR calc non Af Amer >60 >60 mL/min   GFR calc Af Amer >60 >60 mL/min   Anion gap 10 5 - 15    Comment: Performed at [facility], [address] [place], [place] [address]  Troponin I (High Sensitivity)     Status: None   Collection Time: 02/01/20 11:10 AM  Result Value Ref Range   Troponin I (High Sensitivity)  10 <18 ng/L    Comment: (NOTE) Elevated high sensitivity troponin I (hsTnI) values and significant  changes across serial measurements may suggest ACS but many other  chronic and acute conditions are known to elevate hsTnI results.  Refer to the "Links" section for chest pain algorithms and additional  guidance. Performed at [facility], [place], [place] [address]   Troponin I (High Sensitivity)     Status: None   Collection Time: 02/01/20  2:47 PM  Result Value Ref Range   Troponin I (High Sensitivity) 11 <18 ng/L    Comment: (NOTE) Elevated high sensitivity troponin I (hsTnI) values and significant  changes across serial measurements may  suggest ACS but many other  chronic and acute conditions are known to elevate hsTnI results.  Refer to the "Links" section for chest pain algorithms and additional  guidance. Performed at [facility], [address] [place], [place] [address]   SARS Coronavirus 2 by RT PCR (hospital order, performed in [facility] hospital lab) Nasopharyngeal Nasopharyngeal Swab     Status: None   Collection Time: 02/01/20  2:47 PM   Specimen: Nasopharyngeal Swab  Result Value Ref Range   SARS Coronavirus 2 NEGATIVE NEGATIVE    Comment: (NOTE) SARS-CoV-2 target nucleic acids are NOT DETECTED.  The SARS-CoV-2 RNA is generally detectable in upper and lower respiratory specimens during the acute phase of infection. The lowest concentration of SARS-CoV-2 viral copies this assay can detect is 250 copies / mL. A negative result does not preclude SARS-CoV-2 infection and should not be used as the sole basis for treatment or other patient management decisions.  A negative result may occur with improper specimen collection / handling, submission of specimen other than nasopharyngeal swab, presence of viral mutation(s) within the areas targeted by this assay, and inadequate number of viral copies (<250 copies / mL). A negative result must be combined with clinical observations, patient history, and epidemiological information.  Fact Sheet for Patients:   StrictlyIdeas.no  Fact Sheet for Healthcare Providers: BankingDealers.co.za  This test is not yet approved or  cleared by the [place] FDA and has been authorized for detection and/or diagnosis of SARS-CoV-2 by FDA under an Emergency Use Authorization (EUA).  This EUA will remain in effect (meaning this test can be used) for the duration of the COVID-19 declaration under Section 564(b)(1) of the Act, 21 U.S.C. section 360bbb-3(b)(1), unless the authorization is terminated or revoked  sooner.  Performed at [facility], [address] [place], [place] [address]   Lipid panel     Status: Abnormal   Collection Time: 02/01/20  6:38 PM  Result Value Ref Range   Cholesterol 166 0 - 200 mg/dL   Triglycerides 79 <150 mg/dL   HDL 49 >40 mg/dL   Total CHOL/HDL Ratio 3.4 RATIO   VLDL 16 0 - 40 mg/dL   LDL Cholesterol 101 (H) 0 - 99 mg/dL    Comment:        Total Cholesterol/HDL:CHD Risk Coronary Heart Disease Risk Table                     Men   Women  1/2 Average Risk   3.4   3.3  Average Risk       5.0   4.4  2 X Average Risk   9.6   7.1  3 X Average Risk  23.4   11.0        Use  the calculated Patient Ratio above and the CHD Risk Table to determine the patient's CHD Risk.        ATP III CLASSIFICATION (LDL):  <100     mg/dL   Optimal  100-129  mg/dL   Near or Above                    Optimal  130-159  mg/dL   Borderline  160-189  mg/dL   High  >190     mg/dL   Very High Performed at [facility], [place], [place] [address]   Hemoglobin A1c     Status: Abnormal   Collection Time: 02/01/20  6:38 PM  Result Value Ref Range   Hgb A1c MFr Bld 5.7 (H) 4.8 - 5.6 %    Comment: (NOTE) Pre diabetes:          5.7%-6.4%  Diabetes:              >6.4%  Glycemic control for   <7.0% adults with diabetes    Mean Plasma Glucose 116.89 mg/dL    Comment: Performed at [facility] [address], , [place] [address]  TSH     Status: None   Collection Time: 02/01/20  6:38 PM  Result Value Ref Range   TSH 2.454 0.350 - 4.500 uIU/mL    Comment: Performed by a 3rd Generation assay with a functional sensitivity of <=0.01 uIU/mL. Performed at [facility], [address] [place], [place] [address]   Magnesium     Status: None   Collection Time: 02/01/20  6:38 PM  Result Value Ref Range   Magnesium 1.9 1.7 - 2.4 mg/dL    Comment: Performed at [facility], [address] [place], [place] [address]   Brain natriuretic peptide     Status: Abnormal   Collection Time: 02/01/20  6:38 PM  Result Value Ref Range   B Natriuretic Peptide 293.0 (H) 0.0 - 100.0 pg/mL    Comment: Performed at [facility], [address] [place], [place] [address]  HIV Antibody (routine testing w rflx)     Status: None   Collection Time: 02/01/20  6:38 PM  Result Value Ref Range   HIV Screen 4th Generation wRfx Non Reactive Non Reactive    Comment: Performed at [facility], [place] [address] [place], [place] [address]  ECHOCARDIOGRAM COMPLETE     Status: None   Collection Time: 02/01/20 11:29 PM  Result Value Ref Range   Weight 3,296 oz   Height 70 in   BP 170/88 mmHg   S' Lateral 3.08 cm   Area-P 1/2 3.97 cm2  Basic metabolic panel     Status: Abnormal   Collection Time: 02/02/20  4:18 AM  Result Value Ref Range   Sodium 140 135 - 145 mmol/L   Potassium 3.8 3.5 - 5.1 mmol/L   Chloride 107 98 - 111 mmol/L   CO2 25 22 - 32 mmol/L   Glucose, Bld 99 70 - 99 mg/dL    Comment: Glucose reference range applies only to samples taken after fasting for at least 8 hours.   BUN 16 8 - 23 mg/dL   Creatinine, Ser 1.01 0.61 - 1.24 mg/dL   Calcium 8.7 (L) 8.9 - 10.3 mg/dL   GFR calc non Af Amer >60 >60 mL/min   GFR calc Af Amer >60 >60 mL/min   Anion gap 8 5 - 15  Comment: Performed at [facility], [address] [place], [place] [address]  Glucose, capillary     Status: None   Collection Time: 02/12/20 11:10 AM  Result Value Ref Range   Glucose-Capillary 75 70 - 99 mg/dL    Comment: Glucose reference range applies only to samples taken after fasting for at least 8 hours.  SARS CORONAVIRUS 2 (TAT 6-24 HRS) Nasopharyngeal Nasopharyngeal Swab     Status: None   Collection Time: 02/25/20 10:07 AM   Specimen: Nasopharyngeal Swab  Result Value Ref Range   SARS Coronavirus 2 NEGATIVE NEGATIVE    Comment: (NOTE) SARS-CoV-2 target nucleic acids are NOT DETECTED.  The SARS-CoV-2 RNA is  generally detectable in upper and lower respiratory specimens during the acute phase of infection. Negative results do not preclude SARS-CoV-2 infection, do not rule out co-infections with other pathogens, and should not be used as the sole basis for treatment or other patient management decisions. Negative results must be combined with clinical observations, patient history, and epidemiological information. The expected result is Negative.  Fact Sheet for Patients: SugarRoll.be  Fact Sheet for Healthcare Providers: https://www.woods-mathews.com/  This test is not yet approved or cleared by the [place] FDA and  has been authorized for detection and/or diagnosis of SARS-CoV-2 by FDA under an Emergency Use Authorization (EUA). This EUA will remain  in effect (meaning this test can be used) for the duration of the COVID-19 declaration under Se ction 564(b)(1) of the Act, 21 U.S.C. section 360bbb-3(b)(1), unless the authorization is terminated or revoked sooner.  Performed at [facility], [place] [address], [place], [place] [address]   Pulmonary Function Test [facility] Only     Status: None   Collection Time: 02/26/20 11:06 AM  Result Value Ref Range   FVC-%Pred-Pre 71 %   FVC-Post 3.26 L   FVC-%Pred-Post 73 %   FVC-%Change-Post 2 %   FEV1-Pre 1.85 L   FEV1-%Pred-Pre 56 %   FEV1-Post 2.06 L   FEV1-%Pred-Post 63 %   FEV1-%Change-Post 10 %   FEV6-Pre 3.04 L   FEV6-%Pred-Pre 72 %   FEV6-Post 3.18 L   FEV6-%Pred-Post 76 %   FEV6-%Change-Post 4 %   Pre FEV1/FVC ratio 58 %   FEV1FVC-%Pred-Pre 79 %   Post FEV1/FVC ratio 63 %   FEV1FVC-%Change-Post 8 %   Pre FEV6/FVC Ratio 96 %   FEV6FVC-%Pred-Pre 101 %   Post FEV6/FVC ratio 100 %   FEV6FVC-%Pred-Post 105 %   FEV6FVC-%Change-Post 4 %   FEF 25-75 Pre 0.81 L/sec   FEF2575-%Pred-Pre 32 %   FEF 25-75 Post 1.16 L/sec   FEF2575-%Pred-Post 46 %   FEF2575-%Change-Post 42 %   RV 5.05 L     RV % pred 206 %   TLC 8.26 L   TLC % pred 117 %   DLCO unc 10.78 ml/min/mmHg   DLCO unc % pred 41 %   DL/VA 1.85 ml/min/mmHg/L   DL/VA % pred 45 %    Assessment/Plan:  No problem-specific Assessment & Plan notes found for this encounter.      [name] 03/14/2020, 10:06 AM   This note was created with Dragon medical transcription system.  Any errors from dictation are unintentional.

## 2020-03-14 NOTE — Assessment & Plan Note (Signed)
He underwent a PET scan that I have independently reviewed.  This does demonstrate an approximately 4.3 cm infrarenal abdominal aortic aneurysm.  This was not a contrasted study so it is difficult to assess perfusion, but significant aortoiliac calcification is present as well. We discussed the pathophysiology and natural history of aneurysms.  At 4.3 cm, this is below the threshold for prophylactic repair but increased surveillance is necessary over 4 cm.  I will plan to see him back in about 6 months with duplex and we will continue to follow this with duplex.  We discussed that the usual threshold for repair somewhere around 5 cm.

## 2020-03-14 NOTE — Assessment & Plan Note (Signed)
The patient and his wife do describe bilateral lower extremity claudication symptoms.  Although the CT scan I reviewed his uninfused, there is significant calcification which would indicate some degree of aortoiliac disease.  He may very well have infrainguinal disease as well.  He does not have any immediate limb threatening symptoms.  He is on appropriate medical therapy with aspirin and statin agent.  Given his current work-up and treatment for malignancy, this would take a backseat for now.  We will plan to check his ABIs at his next visit.

## 2020-03-14 NOTE — Assessment & Plan Note (Signed)
lipid control important in reducing the progression of atherosclerotic disease. Continue statin therapy  

## 2020-03-14 NOTE — Assessment & Plan Note (Signed)
blood pressure control important in reducing the progression of atherosclerotic disease. On appropriate oral medications.  

## 2020-03-19 DIAGNOSIS — Z8673 Personal history of transient ischemic attack (TIA), and cerebral infarction without residual deficits: Secondary | ICD-10-CM | POA: Diagnosis not present

## 2020-03-19 DIAGNOSIS — R569 Unspecified convulsions: Secondary | ICD-10-CM | POA: Diagnosis not present

## 2020-03-19 DIAGNOSIS — I480 Paroxysmal atrial fibrillation: Secondary | ICD-10-CM | POA: Diagnosis not present

## 2020-03-19 DIAGNOSIS — I69354 Hemiplegia and hemiparesis following cerebral infarction affecting left non-dominant side: Secondary | ICD-10-CM | POA: Diagnosis not present

## 2020-03-19 DIAGNOSIS — Z9889 Other specified postprocedural states: Secondary | ICD-10-CM | POA: Diagnosis not present

## 2020-03-26 ENCOUNTER — Ambulatory Visit (INDEPENDENT_AMBULATORY_CARE_PROVIDER_SITE_OTHER): Payer: Medicare Other | Admitting: Nurse Practitioner

## 2020-03-26 ENCOUNTER — Encounter: Payer: Self-pay | Admitting: Nurse Practitioner

## 2020-03-26 ENCOUNTER — Other Ambulatory Visit: Payer: Self-pay

## 2020-03-26 VITALS — BP 130/72 | HR 77 | Temp 97.8°F | Ht 70.0 in | Wt 204.0 lb

## 2020-03-26 DIAGNOSIS — Z0000 Encounter for general adult medical examination without abnormal findings: Secondary | ICD-10-CM

## 2020-03-26 DIAGNOSIS — I639 Cerebral infarction, unspecified: Secondary | ICD-10-CM

## 2020-03-26 DIAGNOSIS — Z1211 Encounter for screening for malignant neoplasm of colon: Secondary | ICD-10-CM | POA: Diagnosis not present

## 2020-03-26 DIAGNOSIS — I10 Essential (primary) hypertension: Secondary | ICD-10-CM | POA: Diagnosis not present

## 2020-03-26 DIAGNOSIS — R7303 Prediabetes: Secondary | ICD-10-CM

## 2020-03-26 DIAGNOSIS — E785 Hyperlipidemia, unspecified: Secondary | ICD-10-CM | POA: Diagnosis not present

## 2020-03-26 DIAGNOSIS — F1290 Cannabis use, unspecified, uncomplicated: Secondary | ICD-10-CM

## 2020-03-26 DIAGNOSIS — Z23 Encounter for immunization: Secondary | ICD-10-CM

## 2020-03-26 DIAGNOSIS — Z125 Encounter for screening for malignant neoplasm of prostate: Secondary | ICD-10-CM

## 2020-03-26 DIAGNOSIS — Z1159 Encounter for screening for other viral diseases: Secondary | ICD-10-CM | POA: Diagnosis not present

## 2020-03-26 DIAGNOSIS — I2510 Atherosclerotic heart disease of native coronary artery without angina pectoris: Secondary | ICD-10-CM

## 2020-03-26 NOTE — Patient Instructions (Addendum)
Please go to the lab in November for routine blood work. Please  follow-up office visit in December.   Continue with your lifestyle changes.  You have done really well cutting back your marijuana use to only once weekly vaping.  You have changed your diet trying to decrease your sugar content which should help your prediabetes.    You have started walking the dog and are getting more exercise.  You appear stronger today.  Continue walking as you are able.  10-minute intervals 3 times a day and advance as tolerated.  I have ordered Cologuard testing since your last colonoscopy was 10 years ago when you had colon polyp.  I have ordered prostate cancer screening with PSA in November.  Please call the oncology nurse navigator regarding timing of your lung nodule biopsy.  Your blood pressure today was 130/72.  Please monitor this at home if you are able.  Your cholesterol panel shows an LDL above goal of 70, but with your lifestyle changes perhaps will improve.  We can check the lab  again in November and if the LDL is not less than 70 will make medication adjustment.  You are due for an eye exam.  Wife is established with [facility] and you can get your eyeglasses there.  You are also working on your denture implants so that you can chew little easier.     Prediabetes Eating Plan Prediabetes is a condition that causes blood sugar (glucose) levels to be higher than normal. This increases the risk for developing diabetes. In order to prevent diabetes from developing, your health care provider may recommend a diet and other lifestyle changes to help you:  Control your blood glucose levels.  Improve your cholesterol levels.  Manage your blood pressure. Your health care provider may recommend working with a diet and nutrition specialist (dietitian) to make a meal plan that is best for you. What are tips for following this plan? Lifestyle  Set weight loss goals with the help of  your health care team. It is recommended that most people with prediabetes lose 7% of their current body weight.  Exercise for at least 30 minutes at least 5 days a week.  Attend a support group or seek ongoing support from a mental health counselor.  Take over-the-counter and prescription medicines only as told by your health care provider. Reading food labels  Read food labels to check the amount of fat, salt (sodium), and sugar in prepackaged foods. Avoid foods that have: ? Saturated fats. ? Trans fats. ? Added sugars.  Avoid foods that have more than 300 milligrams (mg) of sodium per serving. Limit your daily sodium intake to less than 2,300 mg each day. Shopping  Avoid buying pre-made and processed foods. Cooking  Cook with olive oil. Do not use butter, lard, or ghee.  Bake, broil, grill, or boil foods. Avoid frying. Meal planning   Work with your dietitian to develop an eating plan that is right for you. This may include: ? Tracking how many calories you take in. Use a food diary, notebook, or mobile application to track what you eat at each meal. ? Using the glycemic index (GI) to plan your meals. The index tells you how quickly a food will raise your blood glucose. Choose low-GI foods. These foods take a longer time to raise blood glucose.  Consider following a Mediterranean diet. This diet includes: ? Several servings each day of fresh fruits and vegetables. ? Eating fish at least  twice a week. ? Several servings each day of whole grains, beans, nuts, and seeds. ? Using olive oil instead of other fats. ? Moderate alcohol consumption. ? Eating small amounts of red meat and whole-fat dairy.  If you have high blood pressure, you may need to limit your sodium intake or follow a diet such as the DASH eating plan. DASH is an eating plan that aims to lower high blood pressure. What foods are recommended? The items listed below may not be a complete list. Talk with your  dietitian about what dietary choices are best for you. Grains Whole grains, such as whole-wheat or whole-grain breads, crackers, cereals, and pasta. Unsweetened oatmeal. Bulgur. Barley. Quinoa. Brown rice. Corn or whole-wheat flour tortillas or taco shells. Vegetables Lettuce. Spinach. Peas. Beets. Cauliflower. Cabbage. Broccoli. Carrots. Tomatoes. Squash. Eggplant. Herbs. Peppers. Onions. Cucumbers. Brussels sprouts. Fruits Berries. Bananas. Apples. Oranges. Grapes. Papaya. Mango. Pomegranate. Kiwi. Grapefruit. Cherries. Meats and other protein foods Seafood. Poultry without skin. Lean cuts of pork and beef. Tofu. Eggs. Nuts. Beans. Dairy Low-fat or fat-free dairy products, such as yogurt, cottage cheese, and cheese. Beverages Water. Tea. Coffee. Sugar-free or diet soda. Seltzer water. Lowfat or no-fat milk. Milk alternatives, such as soy or almond milk. Fats and oils Olive oil. Canola oil. Sunflower oil. Grapeseed oil. Avocado. Walnuts. Sweets and desserts Sugar-free or low-fat pudding. Sugar-free or low-fat ice cream and other frozen treats. Seasoning and other foods Herbs. Sodium-free spices. Mustard. Relish. Low-fat, low-sugar ketchup. Low-fat, low-sugar barbecue sauce. Low-fat or fat-free mayonnaise. What foods are not recommended? The items listed below may not be a complete list. Talk with your dietitian about what dietary choices are best for you. Grains Refined white flour and flour products, such as bread, pasta, snack foods, and cereals. Vegetables Canned vegetables. Frozen vegetables with butter or cream sauce. Fruits Fruits canned with syrup. Meats and other protein foods Fatty cuts of meat. Poultry with skin. Breaded or fried meat. Processed meats. Dairy Full-fat yogurt, cheese, or milk. Beverages Sweetened drinks, such as sweet iced tea and soda. Fats and oils Butter. Lard. Ghee. Sweets and desserts Baked goods, such as cake, cupcakes, pastries, cookies, and  cheesecake. Seasoning and other foods Spice mixes with added salt. Ketchup. Barbecue sauce. Mayonnaise. Summary  To prevent diabetes from developing, you may need to make diet and other lifestyle changes to help control blood sugar, improve cholesterol levels, and manage your blood pressure.  Set weight loss goals with the help of your health care team. It is recommended that most people with prediabetes lose 7 percent of their current body weight.  Consider following a Mediterranean diet that includes plenty of fresh fruits and vegetables, whole grains, beans, nuts, seeds, fish, lean meat, low-fat dairy, and healthy oils. This information is not intended to replace advice given to you by your health care provider. Make sure you discuss any questions you have with your health care provider. Document Revised: 10/20/2018 Document Reviewed: 09/01/2016 Elsevier Patient Education  2020 [place] 65 Years and Older, Male Preventive care refers to lifestyle choices and visits with your health care provider that can promote health and wellness. This includes:  A yearly physical exam. This is also called an annual well check.  Regular dental and eye exams.  Immunizations.  Screening for certain conditions.  Healthy lifestyle choices, such as diet and exercise. What can I expect for my preventive care visit? Physical exam Your health care provider will check:  Height and weight. These  may be used to calculate body mass index (BMI), which is a measurement that tells if you are at a healthy weight.  Heart rate and blood pressure.  Your skin for abnormal spots. Counseling Your health care provider may ask you questions about:  Alcohol, tobacco, and drug use.  Emotional well-being.  Home and relationship well-being.  Sexual activity.  Eating habits.  History of falls.  Memory and ability to understand (cognition).  Work and work Statistician. What  immunizations do I need?  Influenza (flu) vaccine  This is recommended every year. Tetanus, diphtheria, and pertussis (Tdap) vaccine  You may need a Td booster every 10 years. Varicella (chickenpox) vaccine  You may need this vaccine if you have not already been vaccinated. Zoster (shingles) vaccine  You may need this after age 45. Pneumococcal conjugate (PCV13) vaccine  One dose is recommended after age 52. Pneumococcal polysaccharide (PPSV23) vaccine  One dose is recommended after age 38. Measles, mumps, and rubella (MMR) vaccine  You may need at least one dose of MMR if you were born in 1957 or later. You may also need a second dose. Meningococcal conjugate (MenACWY) vaccine  You may need this if you have certain conditions. Hepatitis A vaccine  You may need this if you have certain conditions or if you travel or work in places where you may be exposed to hepatitis A. Hepatitis B vaccine  You may need this if you have certain conditions or if you travel or work in places where you may be exposed to hepatitis B. Haemophilus influenzae type b (Hib) vaccine  You may need this if you have certain conditions. You may receive vaccines as individual doses or as more than one vaccine together in one shot (combination vaccines). Talk with your health care provider about the risks and benefits of combination vaccines. What tests do I need? Blood tests  Lipid and cholesterol levels. These may be checked every 5 years, or more frequently depending on your overall health.  Hepatitis C test.  Hepatitis B test. Screening  Lung cancer screening. You may have this screening every year starting at age 41 if you have a 30-pack-year history of smoking and currently smoke or have quit within the past 15 years.  Colorectal cancer screening. All adults should have this screening starting at age 65 and continuing until age 82. Your health care provider may recommend screening at age 82 if  you are at increased risk. You will have tests every 1-10 years, depending on your results and the type of screening test.  Prostate cancer screening. Recommendations will vary depending on your family history and other risks.  Diabetes screening. This is done by checking your blood sugar (glucose) after you have not eaten for a while (fasting). You may have this done every 1-3 years.  Abdominal aortic aneurysm (AAA) screening. You may need this if you are a current or former smoker.  Sexually transmitted disease (STD) testing. Follow these instructions at home: Eating and drinking  Eat a diet that includes fresh fruits and vegetables, whole grains, lean protein, and low-fat dairy products. Limit your intake of foods with high amounts of sugar, saturated fats, and salt.  Take vitamin and mineral supplements as recommended by your health care provider.  Do not drink alcohol if your health care provider tells you not to drink.  If you drink alcohol: ? Limit how much you have to 0-2 drinks a day. ? Be aware of how much alcohol is in your  drink. In the [place] one drink equals one 12 oz bottle of beer (355 mL), one 5 oz glass of wine (148 mL), or one 1 oz glass of hard liquor (44 mL). Lifestyle  Take daily care of your teeth and gums.  Stay active. Exercise for at least 30 minutes on 5 or more days each week.  Do not use any products that contain nicotine or tobacco, such as cigarettes, e-cigarettes, and chewing tobacco. If you need help quitting, ask your health care provider.  If you are sexually active, practice safe sex. Use a condom or other form of protection to prevent STIs (sexually transmitted infections).  Talk with your health care provider about taking a low-dose aspirin or statin. What's next?  Visit your health care provider once a year for a well check visit.  Ask your health care provider how often you should have your eyes and teeth checked.  Stay up to date on all  vaccines. This information is not intended to replace advice given to you by your health care provider. Make sure you discuss any questions you have with your health care provider. Document Revised: 06/22/2018 Document Reviewed: 06/22/2018 Elsevier Patient Education  2020 [employer].

## 2020-03-26 NOTE — Addendum Note (Signed)
Addended by: Denice Paradise A on: 03/26/2020 05:00 PM   Modules accepted: Orders

## 2020-03-26 NOTE — Progress Notes (Signed)
Established Patient Office Visit  Subjective:  Patient ID: Richard Wiggins, male    DOB: 04-Feb-1951  Age: 69 y.o. MRN: [id number]  CC:  Chief Complaint  Patient presents with  . Follow-up    HPI Richard Wiggins is a 69 yo who established care on 02/05/2020 after hospitalization for r/o CVA.  He has a PMH of hypertension, CVA 2018, PE provoked at time of hospitalization for CVA, brain aneurysm status post clipping, CHF, paroxysmal atrial fib on Eliquis, CAD status post PCI to RCA 2018, carotid stenosis status post CEA 2018, hyperlipidemia, BPH, COPD, anemia, OSA, depression, previous cigarette smoker with 53 pack year history now vaping CBD marijuana, pulmonary nodule with malignancy suspected, pre diabetes and  presents for a complete physical exam.   He is followed by several specialists. His main concerns today are trigger finger- in a splint and getting injection, lung nodule- concern about malignancy and upcoming biopsy.  His wife comes in and gives most of his history and reports concerned that he zones out sometimes and does not seem to hear her or process what she is saying.  His attention span is poor. He reports that he gets tingling spells in his left feet and hand.  CAD/atrial fib: stable Eliquis 5 mg twice daily, EC aspirin 81 mg daily, Coreg 25 mg take 1/2 tablet twice daily, Entresto 49-51 mg 1/2 tablet twice daily, spironolactone 25 mg half at bedtime,  furosemide 20 mg as needed couple times a week for chronic mild ankle edema.He is followed by Dr. Garen Lah in Cardiology.  Echocardiogram 02/01/2020 EF 60 to 65% with normal diastolic function.  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  EEG has been ordered. He also has a left MCA aneurysm status post clipping 1999. Followed by Dr. Manuella Ghazi in  Neurology.  He remains on Eliquis and a atorvastatin.  Recommended target lipid goal LDL less than 70.  Left hand weakness and leg weakness.  COPD/lung nodule/emphysema: Presents on Incruse Ellipta 1 puff daily, albuterol inhaler as needed, Symbicort 160-4.5 mics per ACT inhaler 2 puffs twice daily.  Sleep apnea testing in the past.  He is followed by Dr. Patsey Berthold in [place]. Lung nodule- r/o malignancy: followed by [facility]. Lung Bx is planned.  Nicotine dependence: Off cigarettes and was vaping CBD oil daily, and is now down to once weekly.   Essential HTN: Maintained on Entresto 49-51 mg 1/2 tablet twice daily, spironolactone 25 mg half at bedtime,  Coreg 25 mg 1/2 tab twice daily, furosemide 20 mg as needed couple times a week for chronic mild ankle edema.  BP Readings from Last 3 Encounters:  03/26/20 130/72  03/14/20 (!) 148/83  02/19/20 118/62    Pulse Readings from Last 3 Encounters:  03/26/20 77  03/14/20 71  02/19/20 73   PAD/ RT carotid artery stenosis status post internal artery stent and left carotid artery stenosis/AAA  4.3 cm: Followed by Dr. Lucky Cowboy in  vein and vascular.  This is below the threshold for prophylactic repair but increased surveillance is necessary over 4 cm.  Plan to see him back in 6 months with duplex we will continue to follow this with duplex.  Threshold for repair is around 5 cm.  HLD: Remains on Lipitor 40 mg daily Lab Results  Component Value Date   CHOL 166 02/01/2020   HDL 49 02/01/2020  LDLCALC 101 (H) 02/01/2020   TRIG 79 02/01/2020   CHOLHDL 3.4 02/01/2020    Pre D/BMI 29?overweight: Trying to cut back on takeout foods, concentrated sweets, drinking sugared beverages, eating candy  Lab Results  Component Value Date   HGBA1C 5.7 (H) 02/01/2020   Wt Readings from Last 3 Encounters:  03/26/20 204 lb (92.5 kg)  03/14/20 204 lb (92.5 kg)  02/19/20 210 lb (95.3 kg)    Patient presents today for complete physical.  Immunizations: FLU  shot today, Nov due Covid booster. UTD shingles, Tdap, pneumon-23 2018 Diet:bkfst- apple juice- no M&Ms, no lunch-  Mid afternoon meal and snack supper Exercise: Walking the dogs more for exercise Colonoscopy:not done since 2010 in [facility]- had a polyp, declines repeat colonoscopy but would do Cologuard Dentist: edentulous 4 implants last Nov and did not complete and  has trouble chewing. He went to [facility] in [place] and wants to finish it.  Vision: Eyeglasses- 2 years- advised   Past Medical History:  Diagnosis Date  . 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 Anemia 02/09/2020  . Benign essential HTN 02/10/2020  . BPH (benign prostatic hyperplasia) 02/09/2020  . Brain aneurysm   . Chronic kidney disease   . COPD (chronic obstructive pulmonary disease) ([place])   . History of common carotid artery stent placement   . Hypertension   . IDA (iron deficiency anemia) 02/09/2020  . 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 Marijuana use, continuous 02/10/2020  . Pulmonary emboli ([place]) 06/07/2017   Right lower lobe pulmonary embolism small segmental, multifocal multifocal pneumonia, mediastinal lymphadenopathy, moderate centrilobular emphysema  . Stroke [facility])     Past Surgical History:  Procedure Laterality Date  . brain aneurysm with clip    . CORONARY ANGIOPLASTY WITH STENT PLACEMENT  07/08/2017  . hip repleacement Right    Right total hip arthroplasty    Family History  Problem Relation Age of Onset  . Diabetes Mother   . Heart disease Mother   . Stroke Father   . Heart disease Father   . Heart attack Father   . Alcohol abuse Father   . Heart disease Sister   . Heart attack Sister   . Heart disease Maternal Grandmother   . Heart attack Maternal Grandmother   . Heart attack Paternal Grandmother   . Diabetes Brother   . Heart disease Brother   . Heart attack Brother     Social History    Socioeconomic History  . Marital status: Married    Spouse name: Not on file  . Number of children: Not on file  . Years of education: Not on file  . Highest education level: High school graduate  Occupational History  . Occupation: Retired  Tobacco Use  . Smoking status: Former Smoker    Packs/day: 2.00    Years: 53.00    Pack years: 106.00    Types: Cigarettes    Quit date: 05/28/2017    Years since quitting: 2.8  . Smokeless tobacco: Never Used  Vaping Use  . Vaping Use: Some days  . Devices: cbd   Substance and Sexual Activity  . Alcohol use: Yes  . Drug use: Never  . Sexual activity: Not Currently  Other Topics Concern  . Not on file  Social History Narrative   Lives with wife. 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 Children x2 children and grandchildren grown.  Social Determinants of Health   Financial Resource Strain:   . Difficulty of Paying Living Expenses: Not on file  Food Insecurity:   . Worried About Charity fundraiser in the Last Year: Not on file  . Ran Out of Food in the Last Year: Not on file  Transportation Needs:   . Lack of Transportation (Medical): Not on file  . Lack of Transportation (Non-Medical): Not on file  Physical Activity:   . Days of Exercise per Week: Not on file  . Minutes of Exercise per Session: Not on file  Stress:   . Feeling of Stress : Not on file  Social Connections:   . Frequency of Communication with Friends and Family: Not on file  . Frequency of Social Gatherings with Friends and Family: Not on file  . Attends Religious Services: Not on file  . Active Member of Clubs or Organizations: Not on file  . Attends Archivist Meetings: Not on file  . Marital Status: Not on file  Intimate Partner Violence:   . Fear of Current or Ex-Partner: Not on file  . Emotionally Abused: Not on file  . Physically Abused: Not on file  . Sexually Abused: Not on file    Outpatient Medications Prior to Visit   Medication Sig Dispense Refill  . albuterol (VENTOLIN HFA) 108 (90 Base) MCG/ACT inhaler Inhale 2 puffs into the lungs every 4 (four) hours as needed.    [name] apixaban (ELIQUIS) 5 MG TABS tablet Take 5 mg by mouth in the morning and at bedtime.    [name] aspirin EC 81 MG tablet Take 81 mg by mouth daily. Swallow whole.    [name] atorvastatin (LIPITOR) 40 MG tablet TAKE 1 TABLET(40 MG) BY MOUTH AT BEDTIME 90 tablet 0  . budesonide-formoterol (SYMBICORT) 160-4.5 MCG/ACT inhaler Inhale 2 puffs into the lungs 2 (two) times daily.    . carvedilol (COREG) 25 MG tablet Take 12.5 mg by mouth in the morning and at bedtime.    . furosemide (LASIX) 20 MG tablet Take 1-2 tablets by mouth daily as needed for fluid.    [name] ipratropium-albuterol (DUONEB) 0.5-2.5 (3) MG/3ML SOLN Inhale 3 mLs into the lungs every 6 (six) hours as needed.    . sacubitril-valsartan (ENTRESTO) 49-51 MG Take 0.5 tablets by mouth in the morning and at bedtime.    [name] spironolactone (ALDACTONE) 25 MG tablet Take 12.5 mg by mouth at bedtime.    . tamsulosin (FLOMAX) 0.4 MG CAPS capsule TAKE 1 CAPSULE(0.4 MG) BY MOUTH DAILY 90 capsule 0  . umeclidinium bromide (INCRUSE ELLIPTA) 62.5 MCG/INH AEPB Inhale 1 puff into the lungs daily.     No facility-administered medications prior to visit.    No Known Allergies  Review of Systems  Constitutional: Negative for chills, fever and unexpected weight change.  HENT: Negative.   Eyes: Negative.   Respiratory: Negative for cough and shortness of breath.   Cardiovascular: Positive for leg swelling. Negative for chest pain and palpitations.  Gastrointestinal: Negative for abdominal pain, blood in stool, constipation and diarrhea.  Genitourinary: Negative.   Musculoskeletal: Negative.   Skin: Negative.   Allergic/Immunologic: Negative.   Neurological: Negative.   Hematological: Negative.   Psychiatric/Behavioral:       Denies concerns with depression/anxiety      Objective:    Physical Exam Vitals  reviewed.  Constitutional:      Appearance: Normal appearance.  Cardiovascular:     Rate and Rhythm: Regular rhythm.  Pulses: Normal pulses.     Heart sounds: Normal heart sounds.  Pulmonary:     Effort: Pulmonary effort is normal.     Breath sounds: Normal breath sounds.  Abdominal:     Palpations: Abdomen is soft.     Tenderness: There is no abdominal tenderness.  Musculoskeletal:        General: Normal range of motion.     Cervical back: Normal range of motion and neck supple.     Comments: Presents with a rt 3rd trigger finger-splinted  Skin:    General: Skin is warm and dry.  Neurological:     Mental Status: He is alert and oriented to person, place, and time. Mental status is at baseline.     Comments: He is  participating more and speaking more. He is  following the conversation and answering questions appropriately.  He is alert to person, date of birth, place, reason for visit, unable to articulate how he is feeling and he is not sure if the cortisone injection is going to help his finger or not.  Psychiatric:        Attention and Perception: Attention normal.        Mood and Affect: Affect is flat. Affect is not blunt.        Speech: Speech normal.        Behavior: Behavior normal. Behavior is cooperative.        Thought Content: Thought content normal. Thought content is not paranoid. Thought content does not include suicidal ideation.        Cognition and Memory: Memory is impaired.     BP 130/72 (BP Location: Left Arm, Patient Position: Sitting, Cuff Size: Normal)   Pulse 77   Temp 97.8 F (36.6 C) (Oral)   Ht '5\' 10"'  (1.778 m)   Wt 204 lb (92.5 kg)   SpO2 98%   BMI 29.27 kg/m  Wt Readings from Last 3 Encounters:  03/26/20 204 lb (92.5 kg)  03/14/20 204 lb (92.5 kg)  02/19/20 210 lb (95.3 kg)   Pulse Readings from Last 3 Encounters:  03/26/20 77  03/14/20 71  02/19/20 73    BP Readings from Last 3 Encounters:  03/26/20 130/72  03/14/20 (!) 148/83   02/19/20 118/62    Lab Results  Component Value Date   CHOL 166 02/01/2020   HDL 49 02/01/2020   LDLCALC 101 (H) 02/01/2020   TRIG 79 02/01/2020   CHOLHDL 3.4 02/01/2020      Health Maintenance Due  Topic Date Due  . Hepatitis C Screening  Never done  . COLONOSCOPY  Never done  . PNA vac Low Risk Adult (2 of 2 - PCV13) 06/02/2018  . INFLUENZA VACCINE  02/10/2020    There are no preventive care reminders to display for this patient.  Lab Results  Component Value Date   TSH 2.454 02/01/2020   Lab Results  Component Value Date   WBC 4.2 02/01/2020   HGB 12.0 (L) 02/01/2020   HCT 38.6 (L) 02/01/2020   MCV 92.1 02/01/2020   PLT 145 (L) 02/01/2020   Lab Results  Component Value Date   NA 140 02/02/2020   K 3.8 02/02/2020   CO2 25 02/02/2020   GLUCOSE 99 02/02/2020   BUN 16 02/02/2020   CREATININE 1.01 02/02/2020   BILITOT 0.8 02/01/2020   ALKPHOS 91 02/01/2020   AST 23 02/01/2020   ALT 16 02/01/2020   PROT 8.3 (H) 02/01/2020   ALBUMIN 4.3 02/01/2020  CALCIUM 8.7 (L) 02/02/2020   ANIONGAP 8 02/02/2020   Lab Results  Component Value Date   CHOL 166 02/01/2020   Lab Results  Component Value Date   HDL 49 02/01/2020   Lab Results  Component Value Date   LDLCALC 101 (H) 02/01/2020   Lab Results  Component Value Date   TRIG 79 02/01/2020   Lab Results  Component Value Date   CHOLHDL 3.4 02/01/2020   Lab Results  Component Value Date   HGBA1C 5.7 (H) 02/01/2020      Assessment & Plan:   Problem List Items Addressed This Visit      Cardiovascular and Mediastinum   CVA (cerebral vascular accident) ([place]) (Chronic)   CAD (coronary artery disease), native coronary artery (Chronic)   Benign essential HTN     Other   HLD (hyperlipidemia) - Primary (Chronic)   Relevant Orders   Lipid Profile   Marijuana use, continuous   Pre-diabetes   Relevant Orders   Comp Met (CMET)    Other Visit Diagnoses    Prostate cancer screening       Relevant  Orders   PSA   Encounter for HCV screening test for low risk patient       Relevant Orders   Hepatitis C antibody   Colon cancer screening       Relevant Orders   Cologuard      No orders of the defined types were placed in this encounter. Please go to the lab in November for routine blood work. Please  follow-up office visit in December.   Continue with your lifestyle changes.  You have done really well cutting back your marijuana use to only once weekly vaping.  You have changed your diet trying to decrease your sugar content which should help your prediabetes.    You have started walking the dog and are getting more exercise.  You appear stronger today.  Continue walking as you are able.  10-minute intervals 3 times a day and advance as tolerated.  I have ordered Cologuard testing since her last colonoscopy was 10 years ago when you had colon polyp.  I have ordered prostate cancer screening with PSA in November.  Please call the oncology nurse navigator regarding timing of your lung nodule biopsy.  Your blood pressure today was 130/72.  Please monitor this at home if you are able.  Your cholesterol panel shows an LDL above goal of 70, but with your lifestyle changes perhaps will improve.  We can check the lab  again in November and if the LDL is not less than 70 will make medication adjustment.  You are due for an eye exam.  Wife is established with [facility] and you can get your eyeglasses there.  You are also working on your denture implants so that you can chew little easier.  Follow-up: No follow-ups on file.  This visit occurred during the SARS-CoV-2 public health emergency.  Safety protocols were in place, including screening questions prior to the visit, additional usage of staff PPE, and extensive cleaning of exam room while observing appropriate contact time as indicated for disinfecting solutions.    Denice Paradise, NP

## 2020-04-01 ENCOUNTER — Telehealth: Payer: Self-pay | Admitting: Pulmonary Disease

## 2020-04-01 NOTE — Telephone Encounter (Signed)
Called and spoke patient's spouse, Donna(DPR). Per our records, it appears that Dr. Hedwig Morton has requested that patient be seen to discuss possible bronch.  Butch Penny is aware that Dr. Patsey Berthold does not have currently have availability.  Dr. Patsey Berthold, please advise. thanks

## 2020-04-07 ENCOUNTER — Telehealth: Payer: Self-pay | Admitting: Nurse Practitioner

## 2020-04-07 DIAGNOSIS — M65331 Trigger finger, right middle finger: Secondary | ICD-10-CM | POA: Diagnosis not present

## 2020-04-07 NOTE — Telephone Encounter (Signed)
Left message for patient to call back and schedule Medicare Annual Wellness Visit (AWV)   This should be a telephone visit only=30 minutes.  No hx of AWV; please schedule at anytime with Denisa O'Brien-Blaney at [facility]  AWV-I AS OF 10/10/09 PER [place]

## 2020-04-08 NOTE — Telephone Encounter (Signed)
Dr. Gonzalez, please advise. thanks 

## 2020-04-09 NOTE — Telephone Encounter (Signed)
Patient has been scheduled for 04/11/2020 at 12:00. Patient's spouse, Donna(DPR) is aware and voiced her understanding.  Nothing further needed.

## 2020-04-09 NOTE — Telephone Encounter (Signed)
May be able to schedule on Friday morning for a visit can open my schedule.

## 2020-04-11 ENCOUNTER — Telehealth: Payer: Self-pay

## 2020-04-11 ENCOUNTER — Encounter: Payer: Self-pay | Admitting: Pulmonary Disease

## 2020-04-11 ENCOUNTER — Other Ambulatory Visit: Payer: Self-pay

## 2020-04-11 ENCOUNTER — Ambulatory Visit (INDEPENDENT_AMBULATORY_CARE_PROVIDER_SITE_OTHER): Payer: Medicare Other | Admitting: Pulmonary Disease

## 2020-04-11 VITALS — BP 108/70 | HR 61 | Temp 97.7°F | Ht 70.0 in | Wt 207.6 lb

## 2020-04-11 DIAGNOSIS — J449 Chronic obstructive pulmonary disease, unspecified: Secondary | ICD-10-CM

## 2020-04-11 DIAGNOSIS — I639 Cerebral infarction, unspecified: Secondary | ICD-10-CM

## 2020-04-11 DIAGNOSIS — I2510 Atherosclerotic heart disease of native coronary artery without angina pectoris: Secondary | ICD-10-CM | POA: Diagnosis not present

## 2020-04-11 DIAGNOSIS — R911 Solitary pulmonary nodule: Secondary | ICD-10-CM

## 2020-04-11 DIAGNOSIS — C3412 Malignant neoplasm of upper lobe, left bronchus or lung: Secondary | ICD-10-CM

## 2020-04-11 HISTORY — DX: Malignant neoplasm of upper lobe, left bronchus or lung: C34.12

## 2020-04-11 NOTE — H&P (View-Only) (Signed)
Subjective:    Patient ID: Richard Wiggins, male    DOB: 1950/11/20, 69 y.o.   MRN: [id number]  HPI Patient is a very complex 69 year old former smoker (quit 2018) who presents for follow-up of a posterior LEFT upper lobe nodule measuring 1.2 x 1.1 cm.  The nodule was incidentally found on a CT scan of the chest performed during a brief admission at [facility] when the patient had a "spell" that had to be evaluated by neurology.  They were uncertain if the patient had had a TIA or some other type of neurologic event.  He has had prior CVA previously and is on anticoagulation for the same.  The patient does not verbalize much due to his prior CVA, most of the history is obtained from the wife.  He will occasionally interject a single sentence which appears to be incongruous with the rest of the conversation.  He is totally asymptomatic with regards to the nodule.  Chronic issues with dyspnea due to COPD and this has not changed.  He also has a history of cardiomyopathy but recently has been evaluated by cardiology and noted to have vigorous left ventricular function with EF of 60 to 65% and found to be doing well from the cardiac standpoint.    At his initial evaluation here it was noted that he was on both Eliquis and aspirin, the patient reported that this was due to stroke however neurology reports that this is due to PAF.  The patient has had a history of PE in the past with no recent recurrence.  He has been found to be stable from the cerebrovascular and cardiovascular standpoint.  The patient had PFTs on 26 February 2020, this showed FEV1 of 1.85 L or 56% predicted, FVC of 3.19 L or 71% predicted, FEV1/FVC of 58%.  There is hyperinflation and air trapping present.  Diffusion capacity moderately to severely decreased consistent with emphysema.  The patient has not been compliant with Symbicort nor Incruse.  He has been compliant with albuterol as needed.  He cannot explain why he has not been using his  inhalers.  He has been compliant with his cardiac medications.  He does not report any fevers, chills or sweats.  No hemoptysis.  We discussed navigational bronchoscopy to diagnose a lung nodule aforementioned.  Gust that the likelihood that this is malignant is high due to positive FDG uptake on PET CT performed in 12 February 2020.  The patient CT scan of the chest was done on 02 February 2020 and as such will need to be repeated for mapping for navigational bronchoscopy.   Review of Systems  A 10 point review of systems was performed and it is as noted above otherwise negative.  No Known Allergies  Current Meds  Medication Sig  . albuterol (VENTOLIN HFA) 108 (90 Base) MCG/ACT inhaler Inhale 2 puffs into the lungs every 4 (four) hours as needed.  [name] apixaban (ELIQUIS) 5 MG TABS tablet Take 5 mg by mouth in the morning and at bedtime.  [name] aspirin EC 81 MG tablet Take 81 mg by mouth daily. Swallow whole.  [name] atorvastatin (LIPITOR) 40 MG tablet TAKE 1 TABLET(40 MG) BY MOUTH AT BEDTIME  . carvedilol (COREG) 25 MG tablet Take 12.5 mg by mouth in the morning and at bedtime.  . furosemide (LASIX) 20 MG tablet Take 1-2 tablets by mouth daily as needed for fluid.  . sacubitril-valsartan (ENTRESTO) 49-51 MG Take 0.5 tablets by mouth in the morning and at  bedtime.  [name] spironolactone (ALDACTONE) 25 MG tablet Take 12.5 mg by mouth at bedtime.  . tamsulosin (FLOMAX) 0.4 MG CAPS capsule TAKE 1 CAPSULE(0.4 MG) BY MOUTH DAILY       Objective:   Physical Exam BP 108/70 (BP Location: Left Arm, Patient Position: Sitting, Cuff Size: Normal)   Pulse 61   Temp 97.7 F (36.5 C) (Temporal)   Ht 5\' 10"  (1.778 m)   Wt 207 lb 9.6 oz (94.2 kg)   SpO2 99%   BMI 29.79 kg/m  GENERAL: Awake, alert, fully ambulatory.  Flat affect. HEAD: Normocephalic, atraumatic.  EYES: Pupils equal, round, reactive to light.  No scleral icterus.  MOUTH: Nose/mouth/throat not examined due to masking requirements for COVID 19. NECK:  Supple. No thyromegaly. Trachea midline. No JVD.  No adenopathy. PULMONARY: Good air entry bilaterally.  Coarse breath sounds, no other adventitious sounds. CARDIOVASCULAR: S1 and S2. Regular rate and rhythm.  GASTROINTESTINAL: Protuberant abdomen, otherwise benign. MUSCULOSKELETAL: No joint deformity, no clubbing, no edema.  NEUROLOGIC: No overt focal deficits noted.  He seems inattentive.  When he speaks, speech is fluent. SKIN: Intact,warm,dry.  Limited exam no rashes. PSYCH: Flat affect, inattentive, otherwise calm and cooperative.       Assessment & Plan:     ICD-10-CM   1. Lung nodule  R91.1 CT Super D Chest Wo Contrast   Patient will need navigational bronchoscopy for diagnosis CT scan of the chest with SD protocol for mapping purposes Procedure scheduled for 13 October  2. Stage 2 moderate COPD by GOLD classification (HCC)  J44.9    Use Symbicort 2 puffs twice a day Use albuterol as needed May withhold use of Incruse at present  3. Coronary artery disease involving native coronary artery of native heart without angina pectoris  I25.10    Per cardiology patient stable LVEF 60 to 65%  4. Cerebrovascular accident (CVA), unspecified mechanism ([place])  I63.9    Per neurology stable   Orders Placed This Encounter  Procedures  . CT Super D Chest Wo Contrast    Prior to 04/22/20    Standing Status:   Future    Standing Expiration Date:   04/11/2021    Order Specific Question:   Preferred imaging location?    Answer:   [facility]   Discussion:  Patient has a lung nodule on the LEFT upper lobe which will need biopsy.  The safest procedure is bronchoscopy with navigational assistance.  The patient will need new CT scan of the chest for mapping purposes as the most recent CT was in July.  Patient also has moderate COPD, have recommended that he resume use of Symbicort 2 puffs twice a day.  For the purpose of the procedure the patient must withhold Eliquis for at least 3 days  prior to the procedure.  He may continue taking his aspirin 81 mg daily.  Procedure has been scheduled for 13 of October 2021, at 3:15 PM.  Benefits, limitations and potential complications of the procedure were discussed with the patient and wife including, but not limited to bleeding, hemoptysis, respiratory failure requiring intubation and/or prolongued mechanical ventilation, infection, pneumothorax (collapse of lung) requiring chest tube placement, or stroke.Patient agrees to proceed.  The patient will be seen in follow-up in 4 to 6 weeks time after that.  We will notify him of findings from the procedure.  Renold Don, MD [facility]   *This note was dictated using voice recognition software/Dragon.  Despite best efforts to proofread,  errors can occur which can change the meaning.  Any change was purely unintentional.

## 2020-04-11 NOTE — Telephone Encounter (Signed)
Patient has been scheduled for bronchoscopy with navigation and cellvizio on 04/23/2020 at 3:15p. YE:BXID nodule CPT: 56861, [phone]  [facility], please see bronch info. Thanks

## 2020-04-11 NOTE — Progress Notes (Signed)
Subjective:    Patient ID: Richard Wiggins, male    DOB: 07-13-1950, 69 y.o.   MRN: [id number]  HPI Patient is a very complex 69 year old former smoker (quit 2018) who presents for follow-up of a posterior LEFT upper lobe nodule measuring 1.2 x 1.1 cm.  The nodule was incidentally found on a CT scan of the chest performed during a brief admission at [facility] when the patient had a "spell" that had to be evaluated by neurology.  They were uncertain if the patient had had a TIA or some other type of neurologic event.  He has had prior CVA previously and is on anticoagulation for the same.  The patient does not verbalize much due to his prior CVA, most of the history is obtained from the wife.  He will occasionally interject a single sentence which appears to be incongruous with the rest of the conversation.  He is totally asymptomatic with regards to the nodule.  Chronic issues with dyspnea due to COPD and this has not changed.  He also has a history of cardiomyopathy but recently has been evaluated by cardiology and noted to have vigorous left ventricular function with EF of 60 to 65% and found to be doing well from the cardiac standpoint.    At his initial evaluation here it was noted that he was on both Eliquis and aspirin, the patient reported that this was due to stroke however neurology reports that this is due to PAF.  The patient has had a history of PE in the past with no recent recurrence.  He has been found to be stable from the cerebrovascular and cardiovascular standpoint.  The patient had PFTs on 26 February 2020, this showed FEV1 of 1.85 L or 56% predicted, FVC of 3.19 L or 71% predicted, FEV1/FVC of 58%.  There is hyperinflation and air trapping present.  Diffusion capacity moderately to severely decreased consistent with emphysema.  The patient has not been compliant with Symbicort nor Incruse.  He has been compliant with albuterol as needed.  He cannot explain why he has not been using his  inhalers.  He has been compliant with his cardiac medications.  He does not report any fevers, chills or sweats.  No hemoptysis.  We discussed navigational bronchoscopy to diagnose a lung nodule aforementioned.  Gust that the likelihood that this is malignant is high due to positive FDG uptake on PET CT performed in 12 February 2020.  The patient CT scan of the chest was done on 02 February 2020 and as such will need to be repeated for mapping for navigational bronchoscopy.   Review of Systems  A 10 point review of systems was performed and it is as noted above otherwise negative.  No Known Allergies  Current Meds  Medication Sig  . albuterol (VENTOLIN HFA) 108 (90 Base) MCG/ACT inhaler Inhale 2 puffs into the lungs every 4 (four) hours as needed.  [name] apixaban (ELIQUIS) 5 MG TABS tablet Take 5 mg by mouth in the morning and at bedtime.  [name] aspirin EC 81 MG tablet Take 81 mg by mouth daily. Swallow whole.  [name] atorvastatin (LIPITOR) 40 MG tablet TAKE 1 TABLET(40 MG) BY MOUTH AT BEDTIME  . carvedilol (COREG) 25 MG tablet Take 12.5 mg by mouth in the morning and at bedtime.  . furosemide (LASIX) 20 MG tablet Take 1-2 tablets by mouth daily as needed for fluid.  . sacubitril-valsartan (ENTRESTO) 49-51 MG Take 0.5 tablets by mouth in the morning and at  bedtime.  [name] spironolactone (ALDACTONE) 25 MG tablet Take 12.5 mg by mouth at bedtime.  . tamsulosin (FLOMAX) 0.4 MG CAPS capsule TAKE 1 CAPSULE(0.4 MG) BY MOUTH DAILY       Objective:   Physical Exam BP 108/70 (BP Location: Left Arm, Patient Position: Sitting, Cuff Size: Normal)   Pulse 61   Temp 97.7 F (36.5 C) (Temporal)   Ht 5\' 10"  (1.778 m)   Wt 207 lb 9.6 oz (94.2 kg)   SpO2 99%   BMI 29.79 kg/m  GENERAL: Awake, alert, fully ambulatory.  Flat affect. HEAD: Normocephalic, atraumatic.  EYES: Pupils equal, round, reactive to light.  No scleral icterus.  MOUTH: Nose/mouth/throat not examined due to masking requirements for COVID 19. NECK:  Supple. No thyromegaly. Trachea midline. No JVD.  No adenopathy. PULMONARY: Good air entry bilaterally.  Coarse breath sounds, no other adventitious sounds. CARDIOVASCULAR: S1 and S2. Regular rate and rhythm.  GASTROINTESTINAL: Protuberant abdomen, otherwise benign. MUSCULOSKELETAL: No joint deformity, no clubbing, no edema.  NEUROLOGIC: No overt focal deficits noted.  He seems inattentive.  When he speaks, speech is fluent. SKIN: Intact,warm,dry.  Limited exam no rashes. PSYCH: Flat affect, inattentive, otherwise calm and cooperative.       Assessment & Plan:     ICD-10-CM   1. Lung nodule  R91.1 CT Super D Chest Wo Contrast   Patient will need navigational bronchoscopy for diagnosis CT scan of the chest with SD protocol for mapping purposes Procedure scheduled for 13 October  2. Stage 2 moderate COPD by GOLD classification (HCC)  J44.9    Use Symbicort 2 puffs twice a day Use albuterol as needed May withhold use of Incruse at present  3. Coronary artery disease involving native coronary artery of native heart without angina pectoris  I25.10    Per cardiology patient stable LVEF 60 to 65%  4. Cerebrovascular accident (CVA), unspecified mechanism ([place])  I63.9    Per neurology stable   Orders Placed This Encounter  Procedures  . CT Super D Chest Wo Contrast    Prior to 70/12/21    Standing Status:   Future    Standing Expiration Date:   04/11/2021    Order Specific Question:   Preferred imaging location?    Answer:   [facility]   Discussion:  Patient has a lung nodule on the LEFT upper lobe which will need biopsy.  The safest procedure is bronchoscopy with navigational assistance.  The patient will need new CT scan of the chest for mapping purposes as the most recent CT was in July.  Patient also has moderate COPD, have recommended that he resume use of Symbicort 2 puffs twice a day.  For the purpose of the procedure the patient must withhold Eliquis for at least 3 days  prior to the procedure.  He may continue taking his aspirin 81 mg daily.  Procedure has been scheduled for 13 of October 2021, at 3:15 PM.  Benefits, limitations and potential complications of the procedure were discussed with the patient and wife including, but not limited to bleeding, hemoptysis, respiratory failure requiring intubation and/or prolongued mechanical ventilation, infection, pneumothorax (collapse of lung) requiring chest tube placement, or stroke.Patient agrees to proceed.  The patient will be seen in follow-up in 4 to 6 weeks time after that.  We will notify him of findings from the procedure.  Renold Don, MD [facility]   *This note was dictated using voice recognition software/Dragon.  Despite best efforts to proofread,  errors can occur which can change the meaning.  Any change was purely unintentional.

## 2020-04-11 NOTE — Patient Instructions (Signed)
We have scheduled you for biopsy on 13 October at 3:15 PM. You should present to the same day surgery area is an hour before the procedure. You will be notified by surgery testing about the details prior to your procedure. We will need a Covid test prior to your procedure this will be arranged as well.  You will be instructed to stop Eliquis at least 3 days prior to the procedure. You may continue taking your aspirin.   Please take your Symbicort 2 puffs twice a day regularly. This will help your lungs during the procedure as well.   We will see you in follow-up in 6 to 8 weeks but we will be in communication with regards to the biopsy results.

## 2020-04-15 NOTE — Telephone Encounter (Signed)
Phone pre admit visit is scheduled for 04/16/2020 at 11:00 and covid test [facility]/21/2020 [facility].  Patient's spouse, Butch Penny [facility]) is aware of date/time of both pre admit visit and covid test.  Nothing further needed.   Per Okay verbally- no cert is needed for bronch with [employer]

## 2020-04-16 ENCOUNTER — Encounter
Admission: RE | Admit: 2020-04-16 | Discharge: 2020-04-16 | Disposition: A | Discharge status: Home / Self Care | Payer: Medicare Other | Source: Ambulatory Visit | Attending: Pulmonary Disease | Admitting: Pulmonary Disease

## 2020-04-16 ENCOUNTER — Other Ambulatory Visit: Payer: Self-pay

## 2020-04-16 HISTORY — DX: Depression, unspecified: F32.A

## 2020-04-16 HISTORY — DX: Anxiety disorder, unspecified: F41.9

## 2020-04-16 NOTE — Patient Instructions (Addendum)
Your procedure is scheduled on: 04-23-20 Report to Day Surgery on the 2nd floor of the [facility]. To find out your arrival time, please call [phone] between 1PM - 3PM on: 04-22-20  REMEMBER: Instructions that are not followed completely may result in serious medical risk, up to and including death; or upon the discretion of your surgeon and anesthesiologist your surgery may need to be rescheduled.  Do not eat food after midnight the night before surgery.  No gum chewing, lozengers or hard candies.  You may however, drink CLEAR liquids up to 2 hours before you are scheduled to arrive for your surgery. Do not drink anything within 2 hours of your scheduled arrival time.  Clear liquids include: - water  - apple juice without pulp - gatorade (not RED) - black coffee or tea (Do NOT add milk or creamers to the coffee or tea) Do NOT drink anything that is not on this list.   TAKE only THESE MEDICATIONS THE MORNING OF SURGERY WITH A SIP OF WATER: 1. Symbicort inhaler 2. Carvedilol  Use Albuterol  inhaler on the day of surgery and bring to the hospital.  .Follow recommendations from Cardiologist, Pulmonologist or PCP regarding stopping Aspirin, and Eliquis x 3 days before surgery date. Do not take on 10 /10, 10/11, and 10/12.   One week prior to surgery: Stop Anti-inflammatories (NSAIDS) such as Advil, Aleve, Ibuprofen, Motrin, Naproxen, Naprosyn and Aspirin based products such as Excedrin, Goodys Powder, BC Powder. Stop ANY OVER THE COUNTER supplements until after surgery. (You may continue taking Tylenol, Vitamin D, Vitamin B, and multivitamin.)  No Alcohol for 24 hours before or after surgery.  No Smoking including e-cigarettes for 24 hours prior to surgery.  No chewable tobacco products for at least 6 hours prior to surgery.  No nicotine patches on the day of surgery.  Do not use any "recreational" drugs for at least a week prior to your surgery.  Please be advised that  the combination of cocaine and anesthesia may have negative outcomes, up to and including death. If you test positive for cocaine, your surgery will be cancelled.  On the morning of surgery brush your teeth with toothpaste and water, you may rinse your mouth with mouthwash if you wish. Do not swallow any toothpaste or mouthwash.  Do not wear jewelry, make-up, hairpins, clips or nail polish.  Do not wear lotions, powders, or perfumes.   Do not shave 48 hours prior to surgery.   Contact lenses, hearing aids and dentures may not be worn into surgery.  Do not bring valuables to the hospital. [facility] is not responsible for any missing/lost belongings or valuables.   Bring your C-PAP to the hospital with you in case you may have to spend the night.   Notify your doctor if there is any change in your medical condition (cold, fever, infection).  Wear comfortable clothing (specific to your surgery type) to the hospital.  Plan for stool softeners for home use; pain medications have a tendency to cause constipation. You can also help prevent constipation by eating foods high in fiber such as fruits and vegetables and drinking plenty of fluids as your diet allows.  After surgery, you can help prevent lung complications by doing breathing exercises.  Take deep breaths and cough every 1-2 hours. Your doctor may order a device called an Incentive Spirometer to help you take deep breaths. If you are being admitted to the hospital overnight, leave your suitcase in the car.  After surgery it may be brought to your room.  If you are being discharged the day of surgery, you will not be allowed to drive home. You will need a responsible adult (18 years or older) to drive you home and stay with you that night.   If you are taking public transportation, you will need to have a responsible adult (18 years or older) with you. Please confirm with your physician that it is acceptable to use public  transportation.   Please call the [place] Dept. at [phone] if you have any questions about these instructions.  Visitation Policy:  Patients undergoing a surgery or procedure may have one family member or support person with them as long as that person is not COVID-19 positive or experiencing its symptoms.  That person may remain in the waiting area during the procedure.

## 2020-04-17 ENCOUNTER — Ambulatory Visit
Admission: RE | Admit: 2020-04-17 | Discharge: 2020-04-17 | Disposition: A | Discharge status: Home / Self Care | Payer: Medicare Other | Source: Ambulatory Visit | Attending: Pulmonary Disease | Admitting: Pulmonary Disease

## 2020-04-17 DIAGNOSIS — J432 Centrilobular emphysema: Secondary | ICD-10-CM | POA: Diagnosis not present

## 2020-04-17 DIAGNOSIS — R911 Solitary pulmonary nodule: Secondary | ICD-10-CM | POA: Insufficient documentation

## 2020-04-18 ENCOUNTER — Encounter: Payer: Self-pay | Admitting: *Deleted

## 2020-04-18 NOTE — Progress Notes (Signed)
  Oncology Nurse Navigator Documentation  Navigator Location: [facility] (04/18/20 1400)   )Navigator Encounter Type: Telephone (04/18/20 1400) Telephone: [place] Call (04/18/20 1400)                       Barriers/Navigation Needs: Coordination of Care (04/18/20 1400)   Interventions: Coordination of Care (04/18/20 1400)   Coordination of Care: Appts (04/18/20 1400)         spoke with patient's wife to review upcoming appt scheduled with Dr. Janese Banks on 05/01/20. Informed that Dr. Janese Banks will discuss results from his bronchoscopy that is scheduled on 04/23/20. All questions answered during call. Instructed to call back with any further questions or needs. Pt's wife verbalized understanding. Nothing further needed at this time.         Time Spent with Patient: 30 (04/18/20 1400)

## 2020-04-19 DIAGNOSIS — Z1211 Encounter for screening for malignant neoplasm of colon: Secondary | ICD-10-CM | POA: Diagnosis not present

## 2020-04-19 LAB — COLOGUARD: Cologuard: POSITIVE — AB

## 2020-04-21 ENCOUNTER — Ambulatory Visit: Payer: Medicare Other | Admitting: Pulmonary Disease

## 2020-04-21 ENCOUNTER — Other Ambulatory Visit
Admission: RE | Admit: 2020-04-21 | Discharge: 2020-04-21 | Disposition: A | Discharge status: Home / Self Care | Payer: Medicare Other | Source: Ambulatory Visit | Attending: Pulmonary Disease | Admitting: Pulmonary Disease

## 2020-04-21 ENCOUNTER — Other Ambulatory Visit: Payer: Self-pay

## 2020-04-21 DIAGNOSIS — Z20822 Contact with and (suspected) exposure to covid-19: Secondary | ICD-10-CM | POA: Insufficient documentation

## 2020-04-21 DIAGNOSIS — Z01818 Encounter for other preprocedural examination: Secondary | ICD-10-CM | POA: Insufficient documentation

## 2020-04-21 LAB — SARS CORONAVIRUS 2 (TAT 6-24 HRS): SARS Coronavirus 2: NEGATIVE

## 2020-04-23 ENCOUNTER — Other Ambulatory Visit: Payer: Self-pay

## 2020-04-23 ENCOUNTER — Ambulatory Visit
Admission: RE | Admit: 2020-04-23 | Discharge: 2020-04-23 | Disposition: A | Discharge status: Home / Self Care | Payer: Medicare Other | Attending: Pulmonary Disease | Admitting: Pulmonary Disease

## 2020-04-23 ENCOUNTER — Ambulatory Visit: Payer: Medicare Other

## 2020-04-23 ENCOUNTER — Ambulatory Visit: Payer: Medicare Other | Admitting: Urgent Care

## 2020-04-23 ENCOUNTER — Encounter: Payer: Self-pay | Admitting: Pulmonary Disease

## 2020-04-23 ENCOUNTER — Encounter
Admission: RE | Disposition: A | Discharge status: Home / Self Care | Payer: Self-pay | Source: Home / Self Care | Attending: Pulmonary Disease

## 2020-04-23 DIAGNOSIS — I69354 Hemiplegia and hemiparesis following cerebral infarction affecting left non-dominant side: Secondary | ICD-10-CM | POA: Diagnosis not present

## 2020-04-23 DIAGNOSIS — Z955 Presence of coronary angioplasty implant and graft: Secondary | ICD-10-CM | POA: Diagnosis not present

## 2020-04-23 DIAGNOSIS — D649 Anemia, unspecified: Secondary | ICD-10-CM | POA: Diagnosis not present

## 2020-04-23 DIAGNOSIS — I5022 Chronic systolic (congestive) heart failure: Secondary | ICD-10-CM | POA: Diagnosis not present

## 2020-04-23 DIAGNOSIS — Z7982 Long term (current) use of aspirin: Secondary | ICD-10-CM | POA: Diagnosis not present

## 2020-04-23 DIAGNOSIS — Z9889 Other specified postprocedural states: Secondary | ICD-10-CM

## 2020-04-23 DIAGNOSIS — I110 Hypertensive heart disease with heart failure: Secondary | ICD-10-CM | POA: Insufficient documentation

## 2020-04-23 DIAGNOSIS — J449 Chronic obstructive pulmonary disease, unspecified: Secondary | ICD-10-CM | POA: Insufficient documentation

## 2020-04-23 DIAGNOSIS — R911 Solitary pulmonary nodule: Secondary | ICD-10-CM | POA: Diagnosis not present

## 2020-04-23 DIAGNOSIS — Z7951 Long term (current) use of inhaled steroids: Secondary | ICD-10-CM | POA: Insufficient documentation

## 2020-04-23 DIAGNOSIS — R918 Other nonspecific abnormal finding of lung field: Secondary | ICD-10-CM | POA: Diagnosis not present

## 2020-04-23 DIAGNOSIS — N189 Chronic kidney disease, unspecified: Secondary | ICD-10-CM | POA: Diagnosis not present

## 2020-04-23 DIAGNOSIS — F32A Depression, unspecified: Secondary | ICD-10-CM | POA: Diagnosis not present

## 2020-04-23 DIAGNOSIS — Z87891 Personal history of nicotine dependence: Secondary | ICD-10-CM | POA: Insufficient documentation

## 2020-04-23 DIAGNOSIS — I130 Hypertensive heart and chronic kidney disease with heart failure and stage 1 through stage 4 chronic kidney disease, or unspecified chronic kidney disease: Secondary | ICD-10-CM | POA: Diagnosis not present

## 2020-04-23 DIAGNOSIS — Z79899 Other long term (current) drug therapy: Secondary | ICD-10-CM | POA: Insufficient documentation

## 2020-04-23 DIAGNOSIS — K219 Gastro-esophageal reflux disease without esophagitis: Secondary | ICD-10-CM | POA: Diagnosis not present

## 2020-04-23 DIAGNOSIS — F419 Anxiety disorder, unspecified: Secondary | ICD-10-CM | POA: Diagnosis not present

## 2020-04-23 DIAGNOSIS — I509 Heart failure, unspecified: Secondary | ICD-10-CM | POA: Insufficient documentation

## 2020-04-23 HISTORY — PX: VIDEO BRONCHOSCOPY WITH ENDOBRONCHIAL NAVIGATION: SHX6175

## 2020-04-23 LAB — URINE DRUG SCREEN, QUALITATIVE (ARMC ONLY)
Amphetamines, Ur Screen: NOT DETECTED
Barbiturates, Ur Screen: NOT DETECTED
Benzodiazepine, Ur Scrn: NOT DETECTED
Cannabinoid 50 Ng, Ur ~~LOC~~: POSITIVE — AB
Cocaine Metabolite,Ur ~~LOC~~: NOT DETECTED
MDMA (Ecstasy)Ur Screen: NOT DETECTED
Methadone Scn, Ur: NOT DETECTED
Opiate, Ur Screen: NOT DETECTED
Phencyclidine (PCP) Ur S: NOT DETECTED
Tricyclic, Ur Screen: NOT DETECTED

## 2020-04-23 SURGERY — VIDEO BRONCHOSCOPY WITH ENDOBRONCHIAL NAVIGATION
Anesthesia: General

## 2020-04-23 MED ORDER — PHENYLEPHRINE HCL (PRESSORS) 10 MG/ML IV SOLN
INTRAVENOUS | Status: DC | PRN
  Administered 2020-04-23: 150 ug via INTRAVENOUS

## 2020-04-23 MED ORDER — BUTAMBEN-TETRACAINE-BENZOCAINE 2-2-14 % EX AERO
1.0000 | INHALATION_SPRAY | Freq: Once | CUTANEOUS | Status: DC
  Filled 2020-04-23: qty 20

## 2020-04-23 MED ORDER — FAMOTIDINE 20 MG PO TABS
ORAL_TABLET | ORAL | Status: AC
  Administered 2020-04-23: 20 mg via ORAL
  Filled 2020-04-23: qty 1

## 2020-04-23 MED ORDER — ROCURONIUM BROMIDE 100 MG/10ML IV SOLN
INTRAVENOUS | Status: DC | PRN
  Administered 2020-04-23: 50 mg via INTRAVENOUS
  Administered 2020-04-23: 10 mg via INTRAVENOUS

## 2020-04-23 MED ORDER — SODIUM CHLORIDE 0.9 % IV SOLN
INTRAVENOUS | Status: DC | PRN
  Administered 2020-04-23: 40 ug/min via INTRAVENOUS

## 2020-04-23 MED ORDER — CHLORHEXIDINE GLUCONATE 0.12 % MT SOLN
OROMUCOSAL | Status: AC
  Administered 2020-04-23: 15 mL via OROMUCOSAL
  Filled 2020-04-23: qty 15

## 2020-04-23 MED ORDER — LIDOCAINE HCL 4 % MT SOLN
OROMUCOSAL | Status: DC | PRN
  Administered 2020-04-23: 4 mL via TOPICAL

## 2020-04-23 MED ORDER — ONDANSETRON HCL 4 MG/2ML IJ SOLN
INTRAMUSCULAR | Status: AC
  Filled 2020-04-23: qty 2

## 2020-04-23 MED ORDER — ORAL CARE MOUTH RINSE: 15.0000 mL | Freq: Once | OROMUCOSAL | Status: AC

## 2020-04-23 MED ORDER — FENTANYL CITRATE (PF) 100 MCG/2ML IJ SOLN
INTRAMUSCULAR | Status: DC | PRN
Start: 2020-04-23 — End: 2020-04-23
  Administered 2020-04-23: 50 ug via INTRAVENOUS

## 2020-04-23 MED ORDER — LIDOCAINE HCL (CARDIAC) PF 100 MG/5ML IV SOSY
PREFILLED_SYRINGE | INTRAVENOUS | Status: DC | PRN
  Administered 2020-04-23: 100 mg via INTRAVENOUS

## 2020-04-23 MED ORDER — FENTANYL CITRATE (PF) 100 MCG/2ML IJ SOLN
INTRAMUSCULAR | Status: AC
Start: 2020-04-23 — End: ?
  Filled 2020-04-23: qty 2

## 2020-04-23 MED ORDER — SODIUM CHLORIDE FLUSH 0.9 % IV SOLN
INTRAVENOUS | Status: AC
  Filled 2020-04-23: qty 10

## 2020-04-23 MED ORDER — EPHEDRINE SULFATE 50 MG/ML IJ SOLN
INTRAMUSCULAR | Status: DC | PRN
  Administered 2020-04-23: 15 mg via INTRAVENOUS

## 2020-04-23 MED ORDER — PROPOFOL 10 MG/ML IV BOLUS
INTRAVENOUS | Status: DC | PRN
  Administered 2020-04-23: 200 mg via INTRAVENOUS

## 2020-04-23 MED ORDER — DEXAMETHASONE SODIUM PHOSPHATE 10 MG/ML IJ SOLN
INTRAMUSCULAR | Status: AC
  Filled 2020-04-23: qty 1

## 2020-04-23 MED ORDER — CEFAZOLIN SODIUM-DEXTROSE 2-3 GM-%(50ML) IV SOLR
INTRAVENOUS | Status: DC | PRN
  Administered 2020-04-23: 2 g via INTRAVENOUS

## 2020-04-23 MED ORDER — FENTANYL CITRATE (PF) 100 MCG/2ML IJ SOLN: 25.0000 ug | INTRAMUSCULAR | Status: DC | PRN

## 2020-04-23 MED ORDER — ROCURONIUM BROMIDE 10 MG/ML (PF) SYRINGE
PREFILLED_SYRINGE | INTRAVENOUS | Status: AC
  Filled 2020-04-23: qty 10

## 2020-04-23 MED ORDER — ONDANSETRON HCL 4 MG/2ML IJ SOLN: 4.0000 mg | Freq: Once | INTRAMUSCULAR | Status: DC | PRN

## 2020-04-23 MED ORDER — SUGAMMADEX SODIUM 200 MG/2ML IV SOLN
INTRAVENOUS | Status: DC | PRN
  Administered 2020-04-23: 200 mg via INTRAVENOUS

## 2020-04-23 MED ORDER — LIDOCAINE HCL (PF) 2 % IJ SOLN
INTRAMUSCULAR | Status: AC
  Filled 2020-04-23: qty 5

## 2020-04-23 MED ORDER — FAMOTIDINE 20 MG PO TABS: 20.0000 mg | ORAL_TABLET | Freq: Once | ORAL | Status: AC

## 2020-04-23 MED ORDER — CEFAZOLIN SODIUM-DEXTROSE 2-4 GM/100ML-% IV SOLN
INTRAVENOUS | Status: AC
  Filled 2020-04-23: qty 100

## 2020-04-23 MED ORDER — SODIUM CHLORIDE 0.9 % IV SOLN
Freq: Once | INTRAVENOUS | Status: AC
  Administered 2020-04-23: 20 mL/h via INTRAVENOUS

## 2020-04-23 MED ORDER — CHLORHEXIDINE GLUCONATE 0.12 % MT SOLN: 15.0000 mL | Freq: Once | OROMUCOSAL | Status: AC

## 2020-04-23 MED ORDER — PROPOFOL 10 MG/ML IV BOLUS
INTRAVENOUS | Status: AC
  Filled 2020-04-23: qty 20

## 2020-04-23 MED ORDER — EPHEDRINE 5 MG/ML INJ
INTRAVENOUS | Status: AC
  Filled 2020-04-23: qty 10

## 2020-04-23 MED ORDER — ONDANSETRON HCL 4 MG/2ML IJ SOLN
INTRAMUSCULAR | Status: DC | PRN
  Administered 2020-04-23: 4 mg via INTRAVENOUS

## 2020-04-23 MED ORDER — DEXAMETHASONE SODIUM PHOSPHATE 10 MG/ML IJ SOLN
INTRAMUSCULAR | Status: DC | PRN
  Administered 2020-04-23: 10 mg via INTRAVENOUS

## 2020-04-23 MED ORDER — LACTATED RINGERS IV SOLN: INTRAVENOUS | Status: DC

## 2020-04-23 NOTE — Interval H&P Note (Signed)
History and Physical Interval Note:  04/23/2020 1:16 PM  Richard Wiggins  has presented today for surgery, with the diagnosis of LEFT LUNG NODULE.  The various methods of treatment have been discussed with the patient and family. After consideration of risks, benefits and other options for treatment, the patient has consented to  Procedure(s): [place] (N/A) as a surgical intervention.  The patient's history has been reviewed, patient examined, no change in status, stable for surgery.  I have reviewed the patient's chart and labs.  Questions were answered to the patient's satisfaction.      Renold Don, MD [facility]

## 2020-04-23 NOTE — Anesthesia Procedure Notes (Signed)
Procedure Name: Intubation Date/Time: 04/23/2020 1:51 PM Performed by: Eben Burow, CRNA Pre-anesthesia Checklist: Patient identified, Emergency Drugs available, Suction available and Patient being monitored Patient Re-evaluated:Patient Re-evaluated prior to induction Oxygen Delivery Method: Circle system utilized Preoxygenation: Pre-oxygenation with 100% oxygen Induction Type: IV induction Ventilation: Mask ventilation with difficulty and Oral airway inserted - appropriate to patient size Laryngoscope Size: McGraph and 4 Grade View: Grade I Tube type: Oral Tube size: 9.0 mm Number of attempts: 1 Airway Equipment and Method: Stylet,  Oral airway,  LTA kit utilized and Video-laryngoscopy Placement Confirmation: ETT inserted through vocal cords under direct vision,  positive ETCO2 and breath sounds checked- equal and bilateral Secured at: 23 cm Tube secured with: Tape Dental Injury: Teeth and Oropharynx as per pre-operative assessment

## 2020-04-23 NOTE — Transfer of Care (Signed)
Immediate Anesthesia Transfer of Care Note  Patient: Richard Wiggins  Procedure(s) Performed: VIDEO BRONCHOSCOPY WITH ENDOBRONCHIAL NAVIGATION (N/A )  Patient Location: PACU  Anesthesia Type:General  Level of Consciousness: drowsy  Airway & Oxygen Therapy: Patient Spontanous Breathing and Patient connected to face mask oxygen  Post-op Assessment: Report given to RN and Post -op Vital signs reviewed and stable  Post vital signs: Reviewed and stable  Last Vitals:  Vitals Value Taken Time  BP 160/91 04/23/20 1502  Temp 36.1 C 04/23/20 1502  Pulse 61 04/23/20 1506  Resp 18 04/23/20 1506  SpO2 100 % 04/23/20 1506  Vitals shown include unvalidated device data.  Last Pain:  Vitals:   04/23/20 1502  TempSrc:   PainSc: Asleep         Complications: No complications documented.

## 2020-04-23 NOTE — Op Note (Signed)
Electromagnetic Navigation Bronchoscopy: Indication: lung mass/nodule, LEFT upper lobe  Preoperative Diagnosis:lung nodule/mass, LEFT upper lobe Post Procedure Diagnosis:lung nodule/mass, LEFT upper lobe Consent: Verbal/Written  Benefits, limitations and potential complications of the procedure were discussed with the patient/family  including, but not limited to bleeding, hemoptysis, respiratory failure requiring intubation and/or prolongued mechanical ventilation, infection, pneumothorax (collapse of lung) requiring chest tube placement, stroke or even death.  Patient agreed to proceed   Surgeon: Renold Don, MD Assistant/Scrub: Liborio Nixon, RRT Anesthesiologist/CRNA: Dennard Nip, MD/Susan Mare Ferrari, CRNA ROSE available?:  YES, [facility] Type of Anesthesia: General endotracheal   Procedure Performed:  Virtual Bronchoscopy with Multi-planar Image analysis, 3-D reconstruction of coronal, sagittal and multi-planar images for the purposes of planning real-time bronchoscopy using the iLogic Electromagnetic Navigation Bronchoscopy System (superDimension).  Procedure: The patient was taken to Procedure Room 2 (Bronchoscopy Suite) where appropriate timeout was taken with staff.  Patient was inducted under general anesthesia by the anesthesiologist/CRNA team.  Patient was intubated with a #9 oh ET tube without difficulty.  A Portex adapter was placed on the ET tube.  The patient had achieved adequate anesthesia the Olympus video therapeutic bronchoscope was advanced through the Portex adapter.  An anatomic tour of the airway was performed at this point.  The visible trachea was normal, carina was sharp.  The right tracheobronchial tree was normal without lesions.  There were copious inspissated secretions throughout the tracheobronchial tree that were lavaged until clear.  In similar fashion the left tracheobronchial tree was examined and similar findings were noted, no endobronchial lesions but  copious inspissated secretions.  These were lavaged till clear.  Once this portion of the procedure was concluded, an extended channel catheter and locatable guide (LG) combination was inserted into the working channel of the bronchoscope.  At this point registration was initiated.The LG was then placed into the central portion of the trachea and then directed to standard registration points at the following centers: main carina, right upper lobe bronchus, right lower lobe bronchus, right middle lobe bronchus, left upper lobe bronchus, and the left lower lobe bronchus. This data was transferred to the i-Logic ENB system for real-time navigation assisted bronchoscopy.  The locatable guide and standard working channel catheter were navigated to the left upper lobe and target was acquired for tissue sampling.  Locatable guide placement was confirmed by fluoroscopy.  Unfortunately the lesion in question was not visible on fluoroscopy.  At this point the locatable guide was removed and [employer] endo microscopy was performed.  Area of interest was identified however only an edge could be seen.  The area was sampled with transbronchial brushings which were with ROSE.  The findings on ROSE were consistent with inflammatory cells.  At this point the locatable guide was replaced and the target was reacquired an additional brushings were performed of this area.  As the lesion could not be seen on fluoroscopy biopsy forceps were not utilized as this lesion was close to the fissural plane and potential for significant complication could not be reduced.  Multiple brushings were then performed of this area as well.  The brushings were followed by targeted bronchoalveolar lavage of the area.  40 mL of saline were instilled yielding approximately 8 mL of aliquot.  This was completed the airway was examined, hemostasis was noted to be excellent.  The patient received 9 mL total of 1% lidocaine via bronchial lavage into the airway.   The bronchoscope was retrieved and the procedure was terminated.  Patient was allowed  to emerge from general anesthesia and was taken to the PACU in satisfactory condition.  Patient tolerated the procedure well.  Postprocedure chest x-ray showed no pneumothorax.   SuperDimension catheter and LG in place, left upper lobe posterior subsegment.  During procedure.    Brushings during procedure:    Specimens Obtained:  Transbronchial brushings: x3  Bronchoalveolar lavage: Yield 8 mL aliquot   Fluoroscopy:  Fluoroscopy was utilized during the course of this procedure to assure that biopsies were taken in a safe manner under fluoroscopic guidance with spot films required.   Complications:None, postprocedure chest x-ray showed no pneumothorax. Postprocedure x-ray:   Estimated Blood Loss: Nil   Assessment and Plan: 1) Left upper lobe nodule FDG avid rule out cancer 2) Await path report, will notify patient results    C. Derrill Kay, MD [facility]   *This note was dictated using voice recognition software/Dragon.  Despite best efforts to proofread, errors can occur which can change the meaning.  Any change was purely unintentional.

## 2020-04-23 NOTE — Anesthesia Postprocedure Evaluation (Signed)
Anesthesia Post Note  Patient: Weston Settle  Procedure(s) Performed: VIDEO BRONCHOSCOPY WITH ENDOBRONCHIAL NAVIGATION (N/A )  Patient location during evaluation: PACU Anesthesia Type: General Level of consciousness: awake and alert Pain management: pain level controlled Vital Signs Assessment: post-procedure vital signs reviewed and stable Respiratory status: spontaneous breathing and respiratory function stable Cardiovascular status: stable Anesthetic complications: no   No complications documented.   Last Vitals:  Vitals:   04/23/20 1502 04/23/20 1517  BP: (!) 160/91 (!) 172/82  Pulse: 62 63  Resp: (!) 22 19  Temp: (!) 36.1 C   SpO2: 100% 93%    Last Pain:  Vitals:   04/23/20 1517  TempSrc:   PainSc: 0-No pain                 Shaquel Chavous K

## 2020-04-23 NOTE — Discharge Instructions (Addendum)
You may resume Eliquis tonight.    Flexible Bronchoscopy, Care After This sheet gives you information about how to care for yourself after your test. Your doctor may also give you more specific instructions. If you have problems or questions, contact your doctor. Follow these instructions at home: Eating and drinking  Do not eat or drink anything (not even water) for 2 hours after your test, or until your numbing medicine (local anesthetic) wears off.  When your numbness is gone and your cough and gag reflexes have come back, you may: ? Eat only soft foods. ? Slowly drink liquids.  The day after the test, go back to your normal diet. Driving  Do not drive for 24 hours if you were given a medicine to help you relax (sedative).  Do not drive or use heavy machinery while taking prescription pain medicine. General instructions   Take over-the-counter and prescription medicines only as told by your doctor.  Return to your normal activities as told. Ask what activities are safe for you.  Do not use any products that have nicotine or tobacco in them. This includes cigarettes and e-cigarettes. If you need help quitting, ask your doctor.  Keep all follow-up visits as told by your doctor. This is important. It is very important if you had a tissue sample (biopsy) taken. Get help right away if:  You have shortness of breath that gets worse.  You get light-headed.  You feel like you are going to pass out (faint).  You have chest pain.  You cough up: ? More than a little blood. ? More blood than before. Summary  Do not eat or drink anything (not even water) for 2 hours after your test, or until your numbing medicine wears off.  Do not use cigarettes. Do not use e-cigarettes.  Get help right away if you have chest pain. This information is not intended to replace advice given to you by your health care provider. Make sure you discuss any questions you have with your health care  provider. Document Revised: 06/10/2017 Document Reviewed: 07/16/2016 Elsevier Patient Education  2020 [place]   1) The drugs that you were given will stay in your system until tomorrow so for the next 24 hours you should not:  A) Drive an automobile B) Make any legal decisions C) Drink any alcoholic beverage   2) You may resume regular meals tomorrow.  Today it is better to start with liquids and gradually work up to solid foods.  You may eat anything you prefer, but it is better to start with liquids, then soup and crackers, and gradually work up to solid foods.   3) Please notify your doctor immediately if you have any unusual bleeding, trouble breathing, redness and pain at the surgery site, drainage, fever, or pain not relieved by medication.    4) Additional Instructions:        Please contact your physician with any problems or Same Day Surgery at [phone], Monday through Friday 6 am to 4 pm, or [place] at [facility] number at [phone].

## 2020-04-23 NOTE — Anesthesia Preprocedure Evaluation (Signed)
Anesthesia Evaluation  Patient identified by MRN, date of birth, ID band Patient awake    Reviewed: Allergy & Precautions, NPO status , Patient's Chart, lab work & pertinent test results  History of Anesthesia Complications Negative for: history of anesthetic complications  Airway Mallampati: II       Dental  (+) Edentulous Upper, Edentulous Lower   Pulmonary neg sleep apnea, COPD,  COPD inhaler, Patient abstained from smoking., former smoker,           Cardiovascular hypertension, Pt. on medications + Cardiac Stents and +CHF  (-) Past MI (-) dysrhythmias (-) Valvular Problems/Murmurs     Neuro/Psych neg Seizures Anxiety Depression CVA (L hand weakness), Residual Symptoms    GI/Hepatic Neg liver ROS, GERD  Medicated and Controlled,  Endo/Other  neg diabetes  Renal/GU Renal InsufficiencyRenal disease     Musculoskeletal   Abdominal   Peds  Hematology  (+) anemia ,   Anesthesia Other Findings   Reproductive/Obstetrics                             Anesthesia Physical Anesthesia Plan  ASA: III  Anesthesia Plan: General   Post-op Pain Management:    Induction: Intravenous  PONV Risk Score and Plan: 2 and Ondansetron, Dexamethasone and Treatment may vary due to age or medical condition  Airway Management Planned: Oral ETT  Additional Equipment:   Intra-op Plan:   Post-operative Plan:   Informed Consent: I have reviewed the patients History and Physical, chart, labs and discussed the procedure including the risks, benefits and alternatives for the proposed anesthesia with the patient or authorized representative who has indicated his/her understanding and acceptance.       Plan Discussed with:   Anesthesia Plan Comments:         Anesthesia Quick Evaluation

## 2020-04-24 ENCOUNTER — Encounter: Payer: Self-pay | Admitting: Pulmonary Disease

## 2020-04-24 LAB — CYTOLOGY - NON PAP

## 2020-05-01 ENCOUNTER — Telehealth: Payer: Self-pay | Admitting: Nurse Practitioner

## 2020-05-01 ENCOUNTER — Other Ambulatory Visit: Payer: Self-pay

## 2020-05-01 ENCOUNTER — Encounter: Payer: Self-pay | Admitting: Oncology

## 2020-05-01 ENCOUNTER — Encounter: Payer: Self-pay | Admitting: *Deleted

## 2020-05-01 ENCOUNTER — Inpatient Hospital Stay: Payer: Medicare Other | Attending: Oncology | Admitting: Oncology

## 2020-05-01 ENCOUNTER — Inpatient Hospital Stay: Payer: Medicare Other

## 2020-05-01 VITALS — BP 137/24 | HR 70 | Temp 97.3°F | Resp 16 | Wt 216.5 lb

## 2020-05-01 DIAGNOSIS — C3412 Malignant neoplasm of upper lobe, left bronchus or lung: Secondary | ICD-10-CM | POA: Diagnosis not present

## 2020-05-01 DIAGNOSIS — Z7189 Other specified counseling: Secondary | ICD-10-CM

## 2020-05-01 DIAGNOSIS — C3490 Malignant neoplasm of unspecified part of unspecified bronchus or lung: Secondary | ICD-10-CM

## 2020-05-01 NOTE — Telephone Encounter (Signed)
Rejection Reason - Patient Declined" [facility] said on May 01, 2020 3:03 PM

## 2020-05-02 NOTE — Progress Notes (Signed)
  Oncology Nurse Navigator Documentation  Navigator Location: [facility]-Med Onc (05/02/20 0800)   )Navigator Encounter Type: Initial MedOnc (05/02/20 0800)                       Treatment Phase: Pre-Tx/Tx Discussion (05/02/20 0800) Barriers/Navigation Needs: Coordination of Care (05/02/20 0800)   Interventions: Coordination of Care;Referrals (05/02/20 0800) Referrals: Radiation Oncology (05/02/20 0800) Coordination of Care: Appts (05/02/20 0800)       met with patient during initial consultation with Dr. Janese Banks to discuss pathology results and treatment options. All questions answered during visit. Reviewed upcoming appts. Contact info given and instructed to call with any questions or needs.            Time Spent with Patient: 60 (05/02/20 0800)

## 2020-05-06 ENCOUNTER — Encounter: Payer: Self-pay | Admitting: Oncology

## 2020-05-06 ENCOUNTER — Encounter: Payer: Self-pay | Admitting: Radiation Oncology

## 2020-05-06 DIAGNOSIS — Z7189 Other specified counseling: Secondary | ICD-10-CM | POA: Insufficient documentation

## 2020-05-06 DIAGNOSIS — C3412 Malignant neoplasm of upper lobe, left bronchus or lung: Secondary | ICD-10-CM | POA: Insufficient documentation

## 2020-05-06 NOTE — Progress Notes (Signed)
Hematology/Oncology Consult note [facility] [phone] [phone]  Patient Care Team: [name], NP as PCP - General (Nurse Practitioner) Telford Nab, RN as Oncology Nurse Navigator   Name of the patient: Richard Wiggins  [id number]  Oct 29, 1950    Reason for referral-left upper lobe lung nodule   Referring physician-ER  Date of visit: 05/06/20   History of presenting illness- Patient is a 69 year old male who was a former smoker and quit in 2018.  He went to the ER in July 2021 for possible strokelike symptoms and was found to have a left upper lobe lung nodule which was followed up with a CT chest which showed a 1.2 x 1.1 cm left upper lobe spiculated nodule.  This was followed by a PET scan which showed that nodule was hypermetabolic with an SUV of 4.12 patient was also incidentally noted to have a 4.3 cm abdominal aortic aneurysm.  He follows up with Dr. Lucky Cowboy for his aneurysm.  He was seen by Dr. Patsey Berthold and underwent ENB.  Biopsy was nondiagnostic.  However given that the nodule was hypermetabolic it was concerning for malignancy and recommendation per Dr. Patsey Berthold was empiric radiation  Patient currently reports doing well and other than chronic fatigue he denies other complaints at this time  ECOG PS- 1  Pain scale- 0   Review of systems- Review of Systems  Constitutional: Positive for malaise/fatigue. Negative for chills, fever and weight loss.  HENT: Negative for congestion, ear discharge and nosebleeds.   Eyes: Negative for blurred vision.  Respiratory: Negative for cough, hemoptysis, sputum production, shortness of breath and wheezing.   Cardiovascular: Negative for chest pain, palpitations, orthopnea and claudication.  Gastrointestinal: Negative for abdominal pain, blood in stool, constipation, diarrhea, heartburn, melena, nausea and vomiting.  Genitourinary: Negative for dysuria, flank pain, frequency, hematuria  and urgency.  Musculoskeletal: Negative for back pain, joint pain and myalgias.  Skin: Negative for rash.  Neurological: Negative for dizziness, tingling, focal weakness, seizures, weakness and headaches.  Endo/Heme/Allergies: Does not bruise/bleed easily.  Psychiatric/Behavioral: Negative for depression and suicidal ideas. The patient does not have insomnia.     No Known Allergies  Patient Active Problem List   Diagnosis Date Noted  . Preventative health care 03/26/2020  . Atherosclerosis of native arteries of extremity with intermittent claudication ([place]) 03/14/2020  . Trigger middle finger of right hand 02/20/2020  . Abdominal aortic aneurysm (AAA) without rupture ([place]) 02/20/2020  . 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 Marijuana use, continuous 02/10/2020  . Benign essential HTN 02/10/2020  . Pre-diabetes 02/10/2020  . BPH (benign prostatic hyperplasia) 02/09/2020  . Anemia 02/09/2020  . Weakness of lower extremity 02/01/2020  . COPD (chronic obstructive pulmonary disease) ([place]) 02/01/2020  . Chronic systolic CHF (congestive heart failure) ([place]) 02/01/2020  . Depression 02/01/2020  . GERD (gastroesophageal reflux disease) 02/01/2020  . A-fib ([place]) 02/01/2020  . CVA (cerebral vascular accident) ([place]) 02/01/2020  . CAD (coronary artery disease), native coronary artery 02/01/2020  . Carotid artery stenosis 02/01/2020  . HLD (hyperlipidemia) 02/01/2020  . Carotid stenosis, asymptomatic, left 02/14/2018  . S/P coronary artery stent placement 09/20/2017  . Centrilobular emphysema ([place]) 06/10/2017  . Chronic back pain 06/10/2017  . DOE (dyspnea on exertion) 06/10/2017  . Elevated TSH 06/10/2017  . Hematoma of groin 06/10/2017  . PAD (peripheral artery disease) ([place]) 06/10/2017  . Pulmonary embolism ([place]) 06/10/2017  . Tobacco abuse 06/10/2017  . Unintended weight loss 06/10/2017  . AAA (abdominal aortic  aneurysm) ([place]) 06/10/2017  . PAF (paroxysmal atrial  fibrillation) ([place]) 06/10/2017  . Systolic CHF, chronic ([place]) 06/10/2017  . Hypertension 05/31/2017  . Other hyperlipidemia 05/31/2017  . Hemiparesis affecting left side as late effect of cerebrovascular accident ([place]) 05/29/2017  . Carotid artery stenosis, symptomatic, right 05/29/2017  . Embolic stroke involving right middle cerebral artery ([place]) 05/28/2017     Past Medical History:  Diagnosis Date  . 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 Anemia 02/09/2020  . Anxiety   . Benign essential HTN 02/10/2020  . BPH (benign prostatic hyperplasia) 02/09/2020  . Brain aneurysm   . Chronic kidney disease   . COPD (chronic obstructive pulmonary disease) ([place])   . Depression   . History of common carotid artery stent placement   . Hypertension   . IDA (iron deficiency anemia) 02/09/2020  . 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 Marijuana use, continuous 02/10/2020  . PAF (paroxysmal atrial fibrillation) ([place])   . Pulmonary emboli ([place]) 06/07/2017   Right lower lobe pulmonary embolism small segmental, multifocal multifocal pneumonia, mediastinal lymphadenopathy, moderate centrilobular emphysema  . Stroke [facility])    left sided weakness to arm and leg     Past Surgical History:  Procedure Laterality Date  . brain aneurysm with clip    . CORONARY ANGIOPLASTY WITH STENT PLACEMENT  07/08/2017  . hip repleacement Right    Right total hip arthroplasty  . VIDEO BRONCHOSCOPY WITH ENDOBRONCHIAL NAVIGATION N/A 04/23/2020   Procedure: VIDEO BRONCHOSCOPY WITH ENDOBRONCHIAL NAVIGATION;  Surgeon: Tyler Pita, MD;  Location: [facility] ORS;  Service: Pulmonary;  Laterality: N/A;    Social History   Socioeconomic History  . Marital status: Married    Spouse name: Not on file  . Number of children: Not on file  . Years of education: Not on file  . Highest education level: High school graduate  Occupational  History  . Occupation: Retired  Tobacco Use  . Smoking status: Former Smoker    Packs/day: 2.00    Years: 53.00    Pack years: 106.00    Types: Cigarettes    Quit date: 05/28/2017    Years since quitting: 2.9  . Smokeless tobacco: Never Used  Vaping Use  . Vaping Use: Former  . Quit date: 01/15/2020  . Devices: cbd   Substance and Sexual Activity  . Alcohol use: Yes    Comment: socially drink cocktail  . Drug use: Yes    Types: Marijuana    Comment: last smoke x1 month ago  . Sexual activity: Not Currently  Other Topics Concern  . Not on file  Social History Narrative   Lives with wife. 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 Children x2 children and grandchildren grown.    Social Determinants of Health   Financial Resource Strain:   . Difficulty of Paying Living Expenses: Not on file  Food Insecurity:   . Worried About Charity fundraiser in the Last Year: Not on file  . Ran Out of Food in the Last Year: Not on file  Transportation Needs:   . Lack of Transportation (Medical): Not on file  . Lack of Transportation (Non-Medical): Not on file  Physical Activity:   . Days of Exercise per Week: Not on file  . Minutes of Exercise per Session: Not on file  Stress:   . Feeling of Stress : Not on file  Social Connections:   .  Frequency of Communication with Friends and Family: Not on file  . Frequency of Social Gatherings with Friends and Family: Not on file  . Attends Religious Services: Not on file  . Active Member of Clubs or Organizations: Not on file  . Attends Archivist Meetings: Not on file  . Marital Status: Not on file  Intimate Partner Violence:   . Fear of Current or Ex-Partner: Not on file  . Emotionally Abused: Not on file  . Physically Abused: Not on file  . Sexually Abused: Not on file     Family History  Problem Relation Age of Onset  . Diabetes Mother   . Heart disease Mother   . Stroke Father   . Heart disease Father   . Heart  attack Father   . Alcohol abuse Father   . Heart disease Sister   . Heart attack Sister   . Heart disease Maternal Grandmother   . Heart attack Maternal Grandmother   . Heart attack Paternal Grandmother   . Diabetes Brother   . Heart disease Brother   . Heart attack Brother      Current Outpatient Medications:  .  albuterol (VENTOLIN HFA) 108 (90 Base) MCG/ACT inhaler, Inhale 2 puffs into the lungs every 4 (four) hours as needed., Disp: , Rfl:  .  apixaban (ELIQUIS) 5 MG TABS tablet, Take 5 mg by mouth in the morning and at bedtime., Disp: , Rfl:  .  aspirin EC 81 MG tablet, Take 81 mg by mouth daily. Swallow whole., Disp: , Rfl:  .  atorvastatin (LIPITOR) 40 MG tablet, TAKE 1 TABLET(40 MG) BY MOUTH AT BEDTIME (Patient taking differently: Take 40 mg by mouth at bedtime. ), Disp: 90 tablet, Rfl: 0 .  budesonide-formoterol (SYMBICORT) 160-4.5 MCG/ACT inhaler, Inhale 2 puffs into the lungs 2 (two) times daily., Disp: , Rfl:  .  carvedilol (COREG) 25 MG tablet, Take 12.5 mg by mouth in the morning and at bedtime., Disp: , Rfl:  .  furosemide (LASIX) 20 MG tablet, Take 20-40 mg by mouth daily as needed for fluid. , Disp: , Rfl:  .  ipratropium-albuterol (DUONEB) 0.5-2.5 (3) MG/3ML SOLN, Inhale 3 mLs into the lungs every 6 (six) hours as needed. Wheezing shortness of breath, Disp: , Rfl:  .  Lidocaine HCl-Benzyl Alcohol (SALONPAS LIDOCAINE PLUS EX), Place 1 patch onto the skin daily as needed (pain.)., Disp: , Rfl:  .  sacubitril-valsartan (ENTRESTO) 49-51 MG, Take 0.5 tablets by mouth in the morning and at bedtime., Disp: , Rfl:  .  spironolactone (ALDACTONE) 25 MG tablet, Take 12.5 mg by mouth at bedtime., Disp: , Rfl:  .  tamsulosin (FLOMAX) 0.4 MG CAPS capsule, TAKE 1 CAPSULE(0.4 MG) BY MOUTH DAILY (Patient taking differently: Take 0.4 mg by mouth at bedtime. ), Disp: 90 capsule, Rfl: 0   Physical exam:  Vitals:   05/01/20 1502  BP: (!) 137/24  Pulse: 70  Resp: 16  Temp: (!) 97.3 F  (36.3 C)  TempSrc: Tympanic  SpO2: 100%  Weight: 216 lb 8 oz (98.2 kg)   Physical Exam Constitutional:      General: He is not in acute distress. Cardiovascular:     Rate and Rhythm: Normal rate and regular rhythm.     Heart sounds: Normal heart sounds.  Pulmonary:     Effort: Pulmonary effort is normal.     Breath sounds: Normal breath sounds.  Abdominal:     General: Bowel sounds are normal.  Palpations: Abdomen is soft.  Skin:    General: Skin is warm and dry.  Neurological:     Mental Status: He is alert and oriented to person, place, and time.        CMP Latest Ref Rng & Units 02/02/2020  Glucose 70 - 99 mg/dL 99  BUN 8 - 23 mg/dL 16  Creatinine 0.61 - 1.24 mg/dL 1.01  Sodium 135 - 145 mmol/L 140  Potassium 3.5 - 5.1 mmol/L 3.8  Chloride 98 - 111 mmol/L 107  CO2 22 - 32 mmol/L 25  Calcium 8.9 - 10.3 mg/dL 8.7(L)  Total Protein 6.5 - 8.1 g/dL -  Total Bilirubin 0.3 - 1.2 mg/dL -  Alkaline Phos 38 - 126 U/L -  AST 15 - 41 U/L -  ALT 0 - 44 U/L -   CBC Latest Ref Rng & Units 02/01/2020  WBC 4.0 - 10.5 K/uL 4.2  Hemoglobin 13.0 - 17.0 g/dL 12.0(L)  Hematocrit 39 - 52 % 38.6(L)  Platelets 150 - 400 K/uL 145(L)    No images are attached to the encounter.  DG Chest Port 1 View  Result Date: 04/23/2020 CLINICAL DATA:  Status post bronchoscopy EXAM: PORTABLE CHEST 1 VIEW COMPARISON:  02/01/2020, 04/17/2020 FINDINGS: Cardiac shadows within normal limits. Platelike atelectasis/scarring is noted in the right base. No pneumothorax is noted following bronchoscopy. The known left upper lobe mass lesion is not well visualized on this exam. IMPRESSION: No evidence of post bronchoscopy pneumothorax. Electronically Signed   By: Inez Catalina M.D.   On: 04/23/2020 17:19   DG C-Arm 1-60 Min-No Report  Result Date: 04/23/2020 Fluoroscopy was utilized by the requesting physician.  No radiographic interpretation.   CT Super D Chest Wo Contrast  Result Date:  04/17/2020 CLINICAL DATA:  70 year old male with history of pulmonary nodule. Followup study. EXAM: CT CHEST WITHOUT CONTRAST TECHNIQUE: Multidetector CT imaging of the chest was performed using thin slice collimation for electromagnetic bronchoscopy planning purposes, without intravenous contrast. COMPARISON:  Chest CT 02/02/2020.  PET-CT 02/12/2020. FINDINGS: Cardiovascular: Heart size is normal. There is no significant pericardial fluid, thickening or pericardial calcification. There is aortic atherosclerosis, as well as atherosclerosis of the great vessels of the mediastinum and the coronary arteries, including calcified atherosclerotic plaque in the left main, left anterior descending, left circumflex and right coronary arteries. Mediastinum/Nodes: No pathologically enlarged mediastinal or hilar lymph nodes. Please note that accurate exclusion of hilar adenopathy is limited on noncontrast CT scans. Esophagus is unremarkable in appearance. No axillary lymphadenopathy. Lungs/Pleura: Previously noted left upper lobe pulmonary nodule currently measures 2.2 x 1.5 x 0.8 cm (axial image 43 of series 3 and coronal image 100 of series 5). Smaller right upper lobe pulmonary nodule measuring 7 x 4 mm. No acute consolidative airspace disease. No pleural effusions. Mild diffuse bronchial wall thickening with mild centrilobular and paraseptal emphysema. Upper Abdomen: Aortic atherosclerosis. 1.8 cm low-attenuation lesion in the upper pole the left kidney, previously characterized as a simple cyst. Musculoskeletal: There are no aggressive appearing lytic or blastic lesions noted in the visualized portions of the skeleton. IMPRESSION: 1. Pulmonary nodules in the upper lobes of the lungs bilaterally, largest of which is in the posterior aspect of the left upper lobe currently measuring 2.2 x 1.5 x 0.8 cm, concerning for primary bronchogenic neoplasm. 2. Diffuse bronchial wall thickening with mild centrilobular and paraseptal  emphysema; imaging findings suggestive of underlying COPD. 3. Aortic atherosclerosis, in addition to left main and 3 vessel coronary artery disease.  Assessment for potential risk factor modification, dietary therapy or pharmacologic therapy may be warranted, if clinically indicated. Aortic Atherosclerosis (ICD10-I70.0) and Emphysema (ICD10-J43.9). Electronically Signed   By: Vinnie Langton M.D.   On: 04/17/2020 14:29    Assessment and plan- Patient is a 69 y.o. male with spiculated left upper lobe lung nodule and nondiagnostic biopsy referred for further management  Patient will be seeing Dr. Donella Stade on 05/07/2020 to discuss empiric radiation.  His left upper lobe lung nodule was hypermetabolic on PET scan and unfortunately biopsy was nondiagnostic.  Given his underlying COPD and recent stroke he is not a candidate for definitive surgery.  PET scan did not show any evidence of locoregional adenopathy or distant metastatic disease and he therefore does not require any systemic treatment at this time.  I will tentatively see him back in 2 months time after he completes SBRT with a repeat CT chest  Thank you for this kind referral and the opportunity to participate in the care of this patient   Visit Diagnosis 1. Malignant neoplasm of upper lobe of left lung ([place])   2. Goals of care, counseling/discussion     Dr. Randa Evens, MD, MPH [facility] [phone] 05/06/2020 10:37 AM

## 2020-05-07 ENCOUNTER — Encounter: Payer: Self-pay | Admitting: *Deleted

## 2020-05-07 ENCOUNTER — Ambulatory Visit
Admission: RE | Admit: 2020-05-07 | Discharge: 2020-05-07 | Disposition: A | Discharge status: Home / Self Care | Payer: Medicare Other | Source: Ambulatory Visit | Attending: Radiation Oncology | Admitting: Radiation Oncology

## 2020-05-07 ENCOUNTER — Other Ambulatory Visit: Payer: Self-pay

## 2020-05-07 VITALS — BP 154/86 | HR 72 | Temp 96.3°F | Resp 16 | Wt 215.3 lb

## 2020-05-07 DIAGNOSIS — I10 Essential (primary) hypertension: Secondary | ICD-10-CM | POA: Insufficient documentation

## 2020-05-07 DIAGNOSIS — Z86711 Personal history of pulmonary embolism: Secondary | ICD-10-CM | POA: Diagnosis not present

## 2020-05-07 DIAGNOSIS — Z7982 Long term (current) use of aspirin: Secondary | ICD-10-CM | POA: Insufficient documentation

## 2020-05-07 DIAGNOSIS — C3412 Malignant neoplasm of upper lobe, left bronchus or lung: Secondary | ICD-10-CM | POA: Insufficient documentation

## 2020-05-07 DIAGNOSIS — I129 Hypertensive chronic kidney disease with stage 1 through stage 4 chronic kidney disease, or unspecified chronic kidney disease: Secondary | ICD-10-CM | POA: Diagnosis not present

## 2020-05-07 DIAGNOSIS — I714 Abdominal aortic aneurysm, without rupture: Secondary | ICD-10-CM | POA: Diagnosis not present

## 2020-05-07 DIAGNOSIS — Z87891 Personal history of nicotine dependence: Secondary | ICD-10-CM | POA: Diagnosis not present

## 2020-05-07 DIAGNOSIS — Z7901 Long term (current) use of anticoagulants: Secondary | ICD-10-CM | POA: Diagnosis not present

## 2020-05-07 DIAGNOSIS — Z8673 Personal history of transient ischemic attack (TIA), and cerebral infarction without residual deficits: Secondary | ICD-10-CM | POA: Insufficient documentation

## 2020-05-07 DIAGNOSIS — Z79899 Other long term (current) drug therapy: Secondary | ICD-10-CM | POA: Diagnosis not present

## 2020-05-07 DIAGNOSIS — C3490 Malignant neoplasm of unspecified part of unspecified bronchus or lung: Secondary | ICD-10-CM

## 2020-05-07 DIAGNOSIS — N400 Enlarged prostate without lower urinary tract symptoms: Secondary | ICD-10-CM | POA: Insufficient documentation

## 2020-05-07 DIAGNOSIS — J449 Chronic obstructive pulmonary disease, unspecified: Secondary | ICD-10-CM | POA: Diagnosis not present

## 2020-05-07 DIAGNOSIS — N183 Chronic kidney disease, stage 3 unspecified: Secondary | ICD-10-CM | POA: Insufficient documentation

## 2020-05-07 NOTE — Consult Note (Signed)
NEW PATIENT EVALUATION  Name: Richard Wiggins  MRN: [id number]  Date:   05/07/2020     DOB: 12-15-1950   This 69 y.o. male patient presents to the clinic for initial evaluation of presumed stage I non-small cell lung cancer of the left upper lobe.  REFERRING PHYSICIAN: [name], NP  CHIEF COMPLAINT:  Chief Complaint  Patient presents with  . Lung Cancer    Initial consultation    DIAGNOSIS: The encounter diagnosis was Malignant neoplasm of lung, unspecified laterality, unspecified part of lung ([place]).   PREVIOUS INVESTIGATIONS:  CT scans and PET CT scan reviewed Clinical notes reviewed Labs and pathology reviewed  HPI: Patient is a 69 year old male who was seen in July in the emergency room for possible strokelike symptoms and was found on work-up to have a left upper lobe nodule showing a 1.2 x 1.1 cm left upper lobe spiculated nodule concerning for malignancy.  PET CT scan showed hypermetabolic activity up to SUV of 3.2.  Also notable 4.3 cm AAA.  Interestingly the pulmonary nodule measured 1.1 cm back in July 2021 and by October 2021 had increased to 2.2 cm consistent with malignancy he follows with Dr. Lazaro Arms for his aneurysm was seen by Dr. Patsey Berthold underwent ENB although biopsy was nondiagnostic.  PET scan showed no evidence of mediastinal hypermetabolic activity or hilar activity.  Patient is fairly asymptomatic does have a slightly productive cough no evidence for infection.  Patient does have significant COPD has had a CVA in the past and is not thought to be a candidate for definitive thoracic surgery.  PLANNED TREATMENT REGIMEN: SBRT  PAST MEDICAL HISTORY:  has a past medical history of AAA (abdominal aortic aneurysm) ([place]) (2020), Anemia (02/09/2020), Anxiety, Benign essential HTN (02/10/2020), BPH (benign prostatic hyperplasia) (02/09/2020), Brain aneurysm, Chronic kidney disease, COPD (chronic obstructive pulmonary disease) ([place]), Depression, History of common carotid  artery stent placement, Hypertension, IDA (iron deficiency anemia) (02/09/2020), Indeterminate pulmonary nodules (02/10/2020), Marijuana use, continuous (02/10/2020), PAF (paroxysmal atrial fibrillation) ([place]), Pulmonary emboli ([place]) (06/07/2017), and Stroke ([place]).    PAST SURGICAL HISTORY:  Past Surgical History:  Procedure Laterality Date  . brain aneurysm with clip    . CORONARY ANGIOPLASTY WITH STENT PLACEMENT  07/08/2017  . hip repleacement Right    Right total hip arthroplasty  . VIDEO BRONCHOSCOPY WITH ENDOBRONCHIAL NAVIGATION N/A 04/23/2020   Procedure: VIDEO BRONCHOSCOPY WITH ENDOBRONCHIAL NAVIGATION;  Surgeon: Tyler Pita, MD;  Location: [facility] ORS;  Service: Pulmonary;  Laterality: N/A;    FAMILY HISTORY: family history includes Alcohol abuse in his father; Diabetes in his brother and mother; Heart attack in his brother, father, maternal grandmother, paternal grandmother, and sister; Heart disease in his brother, father, maternal grandmother, mother, and sister; Stroke in his father.  SOCIAL HISTORY:  reports that he quit smoking about 2 years ago. His smoking use included cigarettes. He has a 106.00 pack-year smoking history. He has never used smokeless tobacco. He reports current alcohol use. He reports current drug use. Drug: Marijuana.  ALLERGIES: Patient has no known allergies.  MEDICATIONS:  Current Outpatient Medications  Medication Sig Dispense Refill  . albuterol (VENTOLIN HFA) 108 (90 Base) MCG/ACT inhaler Inhale 2 puffs into the lungs every 4 (four) hours as needed.    [name] apixaban (ELIQUIS) 5 MG TABS tablet Take 5 mg by mouth in the morning and at bedtime.    [name] aspirin EC 81 MG tablet Take 81 mg by mouth daily. Swallow whole.    [name] atorvastatin (  LIPITOR) 40 MG tablet TAKE 1 TABLET(40 MG) BY MOUTH AT BEDTIME (Patient taking differently: Take 40 mg by mouth at bedtime. ) 90 tablet 0  . budesonide-formoterol (SYMBICORT) 160-4.5 MCG/ACT inhaler Inhale 2 puffs into the lungs  2 (two) times daily.    . carvedilol (COREG) 25 MG tablet Take 12.5 mg by mouth in the morning and at bedtime.    . furosemide (LASIX) 20 MG tablet Take 20-40 mg by mouth daily as needed for fluid.     [name] ipratropium-albuterol (DUONEB) 0.5-2.5 (3) MG/3ML SOLN Inhale 3 mLs into the lungs every 6 (six) hours as needed. Wheezing shortness of breath    . Lidocaine HCl-Benzyl Alcohol (SALONPAS LIDOCAINE PLUS EX) Place 1 patch onto the skin daily as needed (pain.).    [name] sacubitril-valsartan (ENTRESTO) 49-51 MG Take 0.5 tablets by mouth in the morning and at bedtime.    [name] spironolactone (ALDACTONE) 25 MG tablet Take 12.5 mg by mouth at bedtime.    . tamsulosin (FLOMAX) 0.4 MG CAPS capsule TAKE 1 CAPSULE(0.4 MG) BY MOUTH DAILY (Patient taking differently: Take 0.4 mg by mouth at bedtime. ) 90 capsule 0   No current facility-administered medications for this encounter.    ECOG PERFORMANCE STATUS:  0 - Asymptomatic  REVIEW OF SYSTEMS: Patient denies any weight loss, fatigue, weakness, fever, chills or night sweats. Patient denies any loss of vision, blurred vision. Patient denies any ringing  of the ears or hearing loss. No irregular heartbeat. Patient denies heart murmur or history of fainting. Patient denies any chest pain or pain radiating to her upper extremities. Patient denies any shortness of breath, difficulty breathing at night, cough or hemoptysis. Patient denies any swelling in the lower legs. Patient denies any nausea vomiting, vomiting of blood, or coffee ground material in the vomitus. Patient denies any stomach pain. Patient states has had normal bowel movements no significant constipation or diarrhea. Patient denies any dysuria, hematuria or significant nocturia. Patient denies any problems walking, swelling in the joints or loss of balance. Patient denies any skin changes, loss of hair or loss of weight. Patient denies any excessive worrying or anxiety or significant depression. Patient denies  any problems with insomnia. Patient denies excessive thirst, polyuria, polydipsia. Patient denies any swollen glands, patient denies easy bruising or easy bleeding. Patient denies any recent infections, allergies or URI. Patient "s visual fields have not changed significantly in recent time.   PHYSICAL EXAM: BP (!) 154/86 (BP Location: Left Arm, Patient Position: Sitting)   Pulse 72   Temp (!) 96.3 F (35.7 C) (Tympanic)   Resp 16   Wt 215 lb 4.8 oz (97.7 kg)   BMI 30.89 kg/m  Well-developed well-nourished patient in NAD. HEENT reveals PERLA, EOMI, discs not visualized.  Oral cavity is clear. No oral mucosal lesions are identified. Neck is clear without evidence of cervical or supraclavicular adenopathy. Lungs are clear to A&P. Cardiac examination is essentially unremarkable with regular rate and rhythm without murmur rub or thrill. Abdomen is benign with no organomegaly or masses noted. Motor sensory and DTR levels are equal and symmetric in the upper and lower extremities. Cranial nerves II through XII are grossly intact. Proprioception is intact. No peripheral adenopathy or edema is identified. No motor or sensory levels are noted. Crude visual fields are within normal range.  LABORATORY DATA: Labs and path reviewed    RADIOLOGY RESULTS: CT scan and PET CT scan reviewed compatible with above-stated findings   IMPRESSION: Stage I non-small cell lung  cancer left upper lobe in 69 year old male  PLAN: At this time based on the progressive increase in size and the nodule hypermetabolic activity and spiculated appearance this is considered a stage I non-small cell lung cancer.  I have recommended SBRT treatment.  We will plan on delivering 60 Gy in 5 fractions.  Risks and benefits of treatment including production of cough possible fatigue slight chance of skin reaction all were discussed in detail with the patient.  He seems to comprehend my treatment plan well.  We use 4-dimensional treatment  planning and motion restriction for treatment planning purposes.  I have personally set up and ordered CT simulation for SBRT next week.  I would like to take this opportunity to thank you for allowing me to participate in the care of your patient.Noreene Filbert, MD

## 2020-05-07 NOTE — Progress Notes (Signed)
  Oncology Nurse Navigator Documentation  Navigator Location: [facility] (05/07/20 1100)   )Navigator Encounter Type: Initial RadOnc (05/07/20 1100)                         Barriers/Navigation Needs: Coordination of Care (05/07/20 1100)   Interventions: Coordination of Care (05/07/20 1100)           met with patient and his wife during initial consult with Dr. Baruch Gouty. All questions answered during visit. Reviewed upcoming appts. Instructed to call with any further questions or needs. Pt and his wife verbalized understanding. Nothing further needed at this time.           Time Spent with Patient: 60 (05/07/20 1100)

## 2020-05-13 ENCOUNTER — Ambulatory Visit
Admission: RE | Admit: 2020-05-13 | Discharge: 2020-05-13 | Disposition: A | Discharge status: Home / Self Care | Payer: Medicare Other | Source: Ambulatory Visit | Attending: Radiation Oncology | Admitting: Radiation Oncology

## 2020-05-13 ENCOUNTER — Encounter: Payer: Self-pay | Admitting: *Deleted

## 2020-05-13 DIAGNOSIS — Z510 Encounter for antineoplastic radiation therapy: Secondary | ICD-10-CM | POA: Insufficient documentation

## 2020-05-13 DIAGNOSIS — C3412 Malignant neoplasm of upper lobe, left bronchus or lung: Secondary | ICD-10-CM | POA: Diagnosis not present

## 2020-05-13 NOTE — Progress Notes (Signed)
  Oncology Nurse Navigator Documentation  Navigator Location: [facility] (05/13/20 1400)   )Navigator Encounter Type: Lobby (05/13/20 1400)                     Patient Visit Type: LXBWIO (05/13/20 1400) Treatment Phase: CT SIM (05/13/20 1400) Barriers/Navigation Needs: No Barriers At This Time;No Needs (05/13/20 1400)   Interventions: None Required (05/13/20 1400)           met with patient and his wife in the lobby after complete simulation today. All questions answered during visit. Nothing further needed at this time. Instructed to call with any further questions or needs. Pt and his wife verbalized understanding.           Time Spent with Patient: 30 (05/13/20 1400)

## 2020-05-14 ENCOUNTER — Inpatient Hospital Stay: Payer: Medicare Other | Attending: Oncology | Admitting: Hospice and Palliative Medicine

## 2020-05-14 DIAGNOSIS — C3412 Malignant neoplasm of upper lobe, left bronchus or lung: Secondary | ICD-10-CM

## 2020-05-14 NOTE — Progress Notes (Signed)
Multidisciplinary Oncology Council Documentation  Richard Wiggins was presented by our [facility] on 05/14/2020, which included representatives from:  . Palliative Care . Dietitian . Physical/Occupational Therapist . Speech Therapist . Survivorship Nurse . Nurse Navigator .     Richard Wiggins currently presents with history of stage I NSCLC  We reviewed previous medical and familial history, history of present illness, and recent lab results along with all available histopathologic and imaging studies. The [place] considered available treatment options and made the following recommendations/referrals: No orders of the defined types were placed in this encounter.  Already followed by nurse navigator. Recommend updating ACP/MOST form for [place]. Currently only has CO form in [place].   Monitor for nutrition  The MOC is a meeting of clinicians from various specialty areas who evaluate and discuss patients for whom a multidisciplinary approach is being considered. Final determinations in the plan of care are those of the provider(s).   Today's extended care, comprehensive team conference, Richard Wiggins was not present for the discussion and was not examined.

## 2020-05-16 DIAGNOSIS — Z510 Encounter for antineoplastic radiation therapy: Secondary | ICD-10-CM | POA: Diagnosis not present

## 2020-05-16 DIAGNOSIS — C3412 Malignant neoplasm of upper lobe, left bronchus or lung: Secondary | ICD-10-CM | POA: Diagnosis not present

## 2020-05-16 DIAGNOSIS — Z87891 Personal history of nicotine dependence: Secondary | ICD-10-CM | POA: Diagnosis not present

## 2020-05-18 ENCOUNTER — Other Ambulatory Visit: Payer: Self-pay | Admitting: Nurse Practitioner

## 2020-05-22 ENCOUNTER — Telehealth: Payer: Self-pay | Admitting: Nurse Practitioner

## 2020-05-22 DIAGNOSIS — R195 Other fecal abnormalities: Secondary | ICD-10-CM

## 2020-05-22 NOTE — Telephone Encounter (Signed)
He is Cologuard positive.   PLAN: Urgent referral placed to GI .  Copy to to Dr. Janese Banks  and Nurse Navigator in Oncology.  Mrs. Propst is aware and is in agreement with the plan.

## 2020-05-22 NOTE — Telephone Encounter (Signed)
Referral received and sent.

## 2020-05-26 ENCOUNTER — Encounter: Payer: Self-pay | Admitting: *Deleted

## 2020-05-26 ENCOUNTER — Ambulatory Visit
Admission: RE | Admit: 2020-05-26 | Discharge: 2020-05-26 | Disposition: A | Discharge status: Home / Self Care | Payer: Medicare Other | Source: Ambulatory Visit | Attending: Radiation Oncology | Admitting: Radiation Oncology

## 2020-05-26 DIAGNOSIS — C3412 Malignant neoplasm of upper lobe, left bronchus or lung: Secondary | ICD-10-CM | POA: Diagnosis not present

## 2020-05-26 DIAGNOSIS — Z510 Encounter for antineoplastic radiation therapy: Secondary | ICD-10-CM | POA: Diagnosis not present

## 2020-05-27 ENCOUNTER — Other Ambulatory Visit (INDEPENDENT_AMBULATORY_CARE_PROVIDER_SITE_OTHER): Payer: Medicare Other

## 2020-05-27 ENCOUNTER — Other Ambulatory Visit: Payer: Self-pay

## 2020-05-27 DIAGNOSIS — Z125 Encounter for screening for malignant neoplasm of prostate: Secondary | ICD-10-CM | POA: Diagnosis not present

## 2020-05-27 DIAGNOSIS — R7303 Prediabetes: Secondary | ICD-10-CM

## 2020-05-27 DIAGNOSIS — E785 Hyperlipidemia, unspecified: Secondary | ICD-10-CM | POA: Diagnosis not present

## 2020-05-27 DIAGNOSIS — Z1159 Encounter for screening for other viral diseases: Secondary | ICD-10-CM | POA: Diagnosis not present

## 2020-05-27 DIAGNOSIS — I639 Cerebral infarction, unspecified: Secondary | ICD-10-CM | POA: Diagnosis not present

## 2020-05-27 LAB — COMPREHENSIVE METABOLIC PANEL
ALT: 11 U/L (ref 0–53)
AST: 15 U/L (ref 0–37)
Albumin: 4.2 g/dL (ref 3.5–5.2)
Alkaline Phosphatase: 131 U/L — ABNORMAL HIGH (ref 39–117)
BUN: 21 mg/dL (ref 6–23)
CO2: 28 mEq/L (ref 19–32)
Calcium: 9.1 mg/dL (ref 8.4–10.5)
Chloride: 101 mEq/L (ref 96–112)
Creatinine, Ser: 1.29 mg/dL (ref 0.40–1.50)
GFR: 56.28 mL/min — ABNORMAL LOW (ref >60.00)
Glucose, Bld: 97 mg/dL (ref 70–99)
Potassium: 4.5 mEq/L (ref 3.5–5.1)
Sodium: 137 mEq/L (ref 135–145)
Total Bilirubin: 0.7 mg/dL (ref 0.2–1.2)
Total Protein: 7.3 g/dL (ref 6.0–8.3)

## 2020-05-27 LAB — CBC WITH DIFFERENTIAL/PLATELET
Basophils Absolute: 0 10*3/uL (ref 0.0–0.1)
Basophils Relative: 0.6 % (ref 0.0–3.0)
Eosinophils Absolute: 0.1 10*3/uL (ref 0.0–0.7)
Eosinophils Relative: 2.6 % (ref 0.0–5.0)
HCT: 36.4 % — ABNORMAL LOW (ref 39.0–52.0)
Hemoglobin: 12.1 g/dL — ABNORMAL LOW (ref 13.0–17.0)
Lymphocytes Relative: 24.6 % (ref 12.0–46.0)
Lymphs Abs: 1.1 10*3/uL (ref 0.7–4.0)
MCHC: 33.2 g/dL (ref 30.0–36.0)
MCV: 86.2 fl (ref 78.0–100.0)
Monocytes Absolute: 0.4 10*3/uL (ref 0.1–1.0)
Monocytes Relative: 8.6 % (ref 3.0–12.0)
Neutro Abs: 2.9 10*3/uL (ref 1.4–7.7)
Neutrophils Relative %: 63.6 % (ref 43.0–77.0)
Platelets: 159 10*3/uL (ref 150.0–400.0)
RBC: 4.23 Mil/uL (ref 4.22–5.81)
RDW: 16 % — ABNORMAL HIGH (ref 11.5–15.5)
WBC: 4.6 10*3/uL (ref 4.0–10.5)

## 2020-05-27 LAB — LIPID PANEL
Cholesterol: 116 mg/dL (ref 0–200)
HDL: 46.1 mg/dL (ref >39.00)
LDL Cholesterol: 56 mg/dL (ref 0–99)
NonHDL: 70.32
Total CHOL/HDL Ratio: 3
Triglycerides: 70 mg/dL (ref 0.0–149.0)
VLDL: 14 mg/dL (ref 0.0–40.0)

## 2020-05-27 LAB — PSA: PSA: 0.67 ng/mL (ref 0.10–4.00)

## 2020-05-27 NOTE — Progress Notes (Signed)
°  Oncology Nurse Navigator Documentation  Navigator Location: [facility] (05/26/20 1400)   )Navigator Encounter Type: Appt/Treatment Plan Review (05/26/20 1400)                   Treatment Initiated Date: 05/26/20 (05/26/20 1400) Patient Visit Type: ALPFXT (05/26/20 1400) Treatment Phase: First Radiation Tx (05/26/20 1400) Barriers/Navigation Needs: No Barriers At This Time;No Needs (05/26/20 1400)   Interventions: None Required (05/26/20 1400)                      Time Spent with Patient: 15 (05/26/20 1400)

## 2020-05-28 ENCOUNTER — Ambulatory Visit
Admission: RE | Admit: 2020-05-28 | Discharge: 2020-05-28 | Disposition: A | Discharge status: Home / Self Care | Payer: Medicare Other | Source: Ambulatory Visit | Attending: Radiation Oncology | Admitting: Radiation Oncology

## 2020-05-28 DIAGNOSIS — Z510 Encounter for antineoplastic radiation therapy: Secondary | ICD-10-CM | POA: Diagnosis not present

## 2020-05-28 DIAGNOSIS — C3412 Malignant neoplasm of upper lobe, left bronchus or lung: Secondary | ICD-10-CM | POA: Diagnosis not present

## 2020-05-28 LAB — HEPATITIS C ANTIBODY
Hepatitis C Ab: NONREACTIVE
SIGNAL TO CUT-OFF: 0.01 (ref <1.00)

## 2020-05-30 ENCOUNTER — Encounter: Payer: Self-pay | Admitting: *Deleted

## 2020-05-31 ENCOUNTER — Other Ambulatory Visit: Payer: Self-pay | Admitting: Nurse Practitioner

## 2020-06-02 ENCOUNTER — Encounter: Payer: Self-pay | Admitting: *Deleted

## 2020-06-02 ENCOUNTER — Ambulatory Visit
Admission: RE | Admit: 2020-06-02 | Discharge: 2020-06-02 | Disposition: A | Discharge status: Home / Self Care | Payer: Medicare Other | Source: Ambulatory Visit | Attending: Radiation Oncology | Admitting: Radiation Oncology

## 2020-06-02 DIAGNOSIS — C3412 Malignant neoplasm of upper lobe, left bronchus or lung: Secondary | ICD-10-CM | POA: Diagnosis not present

## 2020-06-02 DIAGNOSIS — Z510 Encounter for antineoplastic radiation therapy: Secondary | ICD-10-CM | POA: Diagnosis not present

## 2020-06-04 ENCOUNTER — Ambulatory Visit
Admission: RE | Admit: 2020-06-04 | Discharge: 2020-06-04 | Disposition: A | Discharge status: Home / Self Care | Payer: Medicare Other | Source: Ambulatory Visit | Attending: Radiation Oncology | Admitting: Radiation Oncology

## 2020-06-04 DIAGNOSIS — Z510 Encounter for antineoplastic radiation therapy: Secondary | ICD-10-CM | POA: Diagnosis not present

## 2020-06-04 DIAGNOSIS — C3412 Malignant neoplasm of upper lobe, left bronchus or lung: Secondary | ICD-10-CM | POA: Diagnosis not present

## 2020-06-09 ENCOUNTER — Ambulatory Visit: Payer: Medicare Other | Admitting: Cardiology

## 2020-06-09 ENCOUNTER — Ambulatory Visit: Payer: Medicare Other

## 2020-06-10 ENCOUNTER — Ambulatory Visit
Admission: RE | Admit: 2020-06-10 | Discharge: 2020-06-10 | Disposition: A | Discharge status: Home / Self Care | Payer: Medicare Other | Source: Ambulatory Visit | Attending: Radiation Oncology | Admitting: Radiation Oncology

## 2020-06-10 DIAGNOSIS — Z87891 Personal history of nicotine dependence: Secondary | ICD-10-CM | POA: Diagnosis not present

## 2020-06-10 DIAGNOSIS — C3412 Malignant neoplasm of upper lobe, left bronchus or lung: Secondary | ICD-10-CM | POA: Diagnosis not present

## 2020-06-10 DIAGNOSIS — Z510 Encounter for antineoplastic radiation therapy: Secondary | ICD-10-CM | POA: Diagnosis not present

## 2020-06-16 ENCOUNTER — Other Ambulatory Visit: Payer: Self-pay

## 2020-06-16 ENCOUNTER — Encounter: Payer: Self-pay | Admitting: Cardiology

## 2020-06-16 ENCOUNTER — Ambulatory Visit: Payer: Medicare Other | Admitting: Cardiology

## 2020-06-16 VITALS — BP 132/84 | HR 61 | Ht 70.0 in | Wt 225.0 lb

## 2020-06-16 DIAGNOSIS — I480 Paroxysmal atrial fibrillation: Secondary | ICD-10-CM | POA: Diagnosis not present

## 2020-06-16 DIAGNOSIS — I639 Cerebral infarction, unspecified: Secondary | ICD-10-CM

## 2020-06-16 DIAGNOSIS — I5020 Unspecified systolic (congestive) heart failure: Secondary | ICD-10-CM

## 2020-06-16 DIAGNOSIS — I2510 Atherosclerotic heart disease of native coronary artery without angina pectoris: Secondary | ICD-10-CM

## 2020-06-16 NOTE — Patient Instructions (Signed)

## 2020-06-16 NOTE — Progress Notes (Signed)
Cardiology Office Note:    Date:  06/16/2020   ID:  Richard Wiggins, DOB 29-Dec-1950, MRN [id number]  PCP:  [name], NP  [facility] Cardiologist:  No primary care provider on file.  [facility] Electrophysiologist:  None   Referring MD: [name], NP   Chief Complaint  Patient presents with  . Follow-up    4 months  Just finished 5 radiation treatments---no new cardiac Sx.    History of Present Illness:    Richard Wiggins is a 69 y.o. 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 fib on Eliquis, CAD s/p PCI to RCA 2018 (LAD 50%, LCx 20%) in [place], former smoker x40+ years, COPD who presents for follow-up.  Being seen for paroxysmal A. fib, CAD, prior CHF with reduced EF, now normalized.  He feels well, denies any palpitations, chest pain or shortness of breath.  He had a pulmonary finding where biopsy was unrevealing.  Radiation therapy was recommended and patient has undergone 5 cycles of radiation.  Also planning on getting colonoscopy due to Cologuard being abnormal   Prior notes Patient's her prior echocardiogram in 06/08/2017 in [place] noted with EF 30 to 35%.  Underwent a left heart catheter coronary in 2018 showing severe stenosis in the 85%.  He underwent PCI to the RCA with drug-eluting stent.  During that hospitalization, EKG/telemetry monitoring showed paroxysmal A. fib.  Also had a stroke during that hospitalization and CEA was performed.  He was subsequently placed on Eliquis.   Echocardiogram on 02/01/2020 showed normal systolic function, EF 60 to 91%, normal diastolic function.  Past Medical History:  Diagnosis Date  . 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 Anemia 02/09/2020  . Anxiety   . Benign essential HTN 02/10/2020  . BPH (benign prostatic hyperplasia) 02/09/2020  . Brain aneurysm   .  Chronic kidney disease   . COPD (chronic obstructive pulmonary disease) ([place])   . Depression   . History of common carotid artery stent placement   . Hypertension   . IDA (iron deficiency anemia) 02/09/2020  . 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 Marijuana use, continuous 02/10/2020  . PAF (paroxysmal atrial fibrillation) ([place])   . Pulmonary emboli ([place]) 06/07/2017   Right lower lobe pulmonary embolism small segmental, multifocal multifocal pneumonia, mediastinal lymphadenopathy, moderate centrilobular emphysema  . Stroke [facility])    left sided weakness to arm and leg    Past Surgical History:  Procedure Laterality Date  . brain aneurysm with clip    . CORONARY ANGIOPLASTY WITH STENT PLACEMENT  07/08/2017  . hip repleacement Right    Right total hip arthroplasty  . VIDEO BRONCHOSCOPY WITH ENDOBRONCHIAL NAVIGATION N/A 04/23/2020   Procedure: VIDEO BRONCHOSCOPY WITH ENDOBRONCHIAL NAVIGATION;  Surgeon: Tyler Pita, MD;  Location: [facility] ORS;  Service: Pulmonary;  Laterality: N/A;    Current Medications: Current Meds  Medication Sig  . apixaban (ELIQUIS) 5 MG TABS tablet Take 5 mg by mouth in the morning and at bedtime.  [name] aspirin EC 81 MG tablet Take 81 mg by mouth daily. Swallow whole.  [name] atorvastatin (LIPITOR) 40 MG tablet TAKE 1 TABLET(40 MG) BY MOUTH AT BEDTIME  . budesonide-formoterol (SYMBICORT) 160-4.5 MCG/ACT inhaler Inhale 2 puffs into the lungs 2 (two) times daily.  . carvedilol (COREG) 25 MG tablet Take 12.5 mg by mouth in the  morning and at bedtime.  . furosemide (LASIX) 20 MG tablet Take 20-40 mg by mouth daily as needed for fluid.   [name] ipratropium-albuterol (DUONEB) 0.5-2.5 (3) MG/3ML SOLN Inhale 3 mLs into the lungs every 6 (six) hours as needed. Wheezing shortness of breath  . Lidocaine HCl-Benzyl Alcohol (SALONPAS LIDOCAINE PLUS EX) Place 1 patch onto the skin daily as needed (pain.).  [name] sacubitril-valsartan (ENTRESTO) 49-51 MG Take 0.5 tablets by mouth in the  morning and at bedtime.  [name] spironolactone (ALDACTONE) 25 MG tablet Take 12.5 mg by mouth at bedtime.  . tamsulosin (FLOMAX) 0.4 MG CAPS capsule TAKE 1 CAPSULE(0.4 MG) BY MOUTH DAILY     Allergies:   Patient has no known allergies.   Social History   Socioeconomic History  . Marital status: Married    Spouse name: Not on file  . Number of children: Not on file  . Years of education: Not on file  . Highest education level: High school graduate  Occupational History  . Occupation: Retired  Tobacco Use  . Smoking status: Former Smoker    Packs/day: 2.00    Years: 53.00    Pack years: 106.00    Types: Cigarettes    Quit date: 05/28/2017    Years since quitting: 3.0  . Smokeless tobacco: Never Used  Vaping Use  . Vaping Use: Former  . Quit date: 01/15/2020  . Devices: cbd   Substance and Sexual Activity  . Alcohol use: Yes    Comment: socially drink cocktail  . Drug use: Yes    Types: Marijuana    Comment: last smoke x1 month ago  . Sexual activity: Not Currently  Other Topics Concern  . Not on file  Social History Narrative   Lives with wife. 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 Children x2 children and grandchildren grown.    Social Determinants of Health   Financial Resource Strain:   . Difficulty of Paying Living Expenses: Not on file  Food Insecurity:   . Worried About Charity fundraiser in the Last Year: Not on file  . Ran Out of Food in the Last Year: Not on file  Transportation Needs:   . Lack of Transportation (Medical): Not on file  . Lack of Transportation (Non-Medical): Not on file  Physical Activity:   . Days of Exercise per Week: Not on file  . Minutes of Exercise per Session: Not on file  Stress:   . Feeling of Stress : Not on file  Social Connections:   . Frequency of Communication with Friends and Family: Not on file  . Frequency of Social Gatherings with Friends and Family: Not on file  . Attends Religious Services: Not on file  .  Active Member of Clubs or Organizations: Not on file  . Attends Archivist Meetings: Not on file  . Marital Status: Not on file     Family History: The patient's family history includes Alcohol abuse in his father; Diabetes in his brother and mother; Heart attack in his brother, father, maternal grandmother, paternal grandmother, and sister; Heart disease in his brother, father, maternal grandmother, mother, and sister; Stroke in his father.  ROS:   Please see the history of present illness.     All other systems reviewed and are negative.  EKGs/Labs/Other Studies Reviewed:    The following studies were reviewed today:   EKG:  EKG is  ordered today.  The ekg ordered today demonstrates normal sinus rhythm, normal ECG.  Recent Labs: 02/01/2020: B Natriuretic Peptide 293.0; Magnesium 1.9; TSH 2.454 05/27/2020: ALT 11; BUN 21; Creatinine, Ser 1.29; Hemoglobin 12.1; Platelets 159.0; Potassium 4.5; Sodium 137  Recent Lipid Panel    Component Value Date/Time   CHOL 116 05/27/2020 0802   TRIG 70.0 05/27/2020 0802   HDL 46.10 05/27/2020 0802   CHOLHDL 3 05/27/2020 0802   VLDL 14.0 05/27/2020 0802   LDLCALC 56 05/27/2020 0802    Physical Exam:    VS:  BP 132/84   Pulse 61   Ht 5\' 10"  (1.778 m)   Wt 225 lb (102.1 kg)   BMI 32.28 kg/m     Wt Readings from Last 3 Encounters:  06/16/20 225 lb (102.1 kg)  05/07/20 215 lb 4.8 oz (97.7 kg)  05/01/20 216 lb 8 oz (98.2 kg)     GEN:  Well nourished, well developed in no acute distress HEENT: Normal NECK: No JVD; No carotid bruits LYMPHATICS: No lymphadenopathy CARDIAC: RRR, no murmurs, rubs, gallops RESPIRATORY:  Clear to auscultation without rales, wheezing or rhonchi  ABDOMEN: Soft, non-tender, non-distended MUSCULOSKELETAL:  1+ edema; No deformity  SKIN: Warm and dry NEUROLOGIC:  Alert and oriented x 3 PSYCHIATRIC:  Normal affect   ASSESSMENT:    1. Coronary artery disease involving native coronary artery of  native heart without angina pectoris   2. HFrEF (heart failure with reduced ejection fraction) (HCC)   3. Paroxysmal atrial fibrillation ([place])   4. Cerebrovascular accident (CVA), unspecified mechanism ([place])    PLAN:    In order of problems listed above:  1. CAD status post PCI to RCA.  Continue aspirin, statin. 2. History of heart failure reduced EF, last echocardiogram showed normal EF 60 to 65%.  Continue Coreg, Entresto, spironolactone.  Lasix as needed. 3. paroxysmal atrial fibrillation, currently in sinus rhythm. Coreg, Eliquis. 4. prior CVA, continue statin, aspirin, and Eliquis.  Follow-up in 6 months.  Total encounter time  35 minutes  Greater than 50% was spent in counseling and coordination of care with the patient    Medication Adjustments/Labs and Tests Ordered: Current medicines are reviewed at length with the patient today.  Concerns regarding medicines are outlined above.  Orders Placed This Encounter  Procedures  . EKG 12-Lead   No orders of the defined types were placed in this encounter.   Patient Instructions  Medication Instructions:  Your physician recommends that you continue on your current medications as directed. Please refer to the Current Medication list given to you today.  *If you need a refill on your cardiac medications before your next appointment, please call your pharmacy*   Lab Work: None ordered If you have labs (blood work) drawn today and your tests are completely normal, you will receive your results only by: [name] MyChart Message (if you have MyChart) OR . A paper copy in the mail If you have any lab test that is abnormal or we need to change your treatment, we will call you to review the results.   Testing/Procedures: None ordered   Follow-Up: At [facility], you and your health needs are our priority.  As part of our continuing mission to provide you with exceptional heart care, we have created designated Provider Care Teams.   These Care Teams include your primary Cardiologist (physician) and Advanced Practice Providers (APPs -  Physician Assistants and Nurse Practitioners) who all work together to provide you with the care you need, when you need it.  We recommend signing up for the patient  portal called "MyChart".  Sign up information is provided on this After Visit Summary.  MyChart is used to connect with patients for Virtual Visits (Telemedicine).  Patients are able to view lab/test results, encounter notes, upcoming appointments, etc.  Non-urgent messages can be sent to your provider as well.   To learn more about what you can do with MyChart, go to [email or web address].    Your next appointment:   6 month(s)  The format for your next appointment:   In Person  Provider:   Kate Sable, MD   Other Instructions      Signed, Kate Sable, MD  06/16/2020 12:45 PM    [place]

## 2020-06-21 DIAGNOSIS — R569 Unspecified convulsions: Secondary | ICD-10-CM | POA: Diagnosis not present

## 2020-06-23 ENCOUNTER — Telehealth: Payer: Self-pay | Admitting: *Deleted

## 2020-06-23 ENCOUNTER — Ambulatory Visit
Admission: RE | Admit: 2020-06-23 | Discharge: 2020-06-23 | Disposition: A | Discharge status: Home / Self Care | Payer: Medicare Other | Source: Ambulatory Visit | Attending: Oncology | Admitting: Oncology

## 2020-06-23 ENCOUNTER — Other Ambulatory Visit: Payer: Self-pay

## 2020-06-23 ENCOUNTER — Telehealth (INDEPENDENT_AMBULATORY_CARE_PROVIDER_SITE_OTHER): Payer: Self-pay | Admitting: Gastroenterology

## 2020-06-23 ENCOUNTER — Telehealth: Payer: Self-pay | Admitting: Cardiology

## 2020-06-23 DIAGNOSIS — R195 Other fecal abnormalities: Secondary | ICD-10-CM

## 2020-06-23 DIAGNOSIS — M7989 Other specified soft tissue disorders: Secondary | ICD-10-CM

## 2020-06-23 DIAGNOSIS — I5020 Unspecified systolic (congestive) heart failure: Secondary | ICD-10-CM

## 2020-06-23 DIAGNOSIS — I2510 Atherosclerotic heart disease of native coronary artery without angina pectoris: Secondary | ICD-10-CM

## 2020-06-23 DIAGNOSIS — I480 Paroxysmal atrial fibrillation: Secondary | ICD-10-CM

## 2020-06-23 DIAGNOSIS — Z79899 Other long term (current) drug therapy: Secondary | ICD-10-CM

## 2020-06-23 MED ORDER — NA SULFATE-K SULFATE-MG SULF 17.5-3.13-1.6 GM/177ML PO SOLN: 1.0000 | Freq: Once | ORAL | 0 refills | Status: AC

## 2020-06-23 MED ORDER — FUROSEMIDE 40 MG PO TABS: 40.0000 mg | ORAL_TABLET | Freq: Every day | ORAL | 3 refills | Status: DC

## 2020-06-23 NOTE — Progress Notes (Signed)
Gastroenterology Pre-Procedure Review  Request Date: Wed 07/23/20 Requesting Physician: Dr. Marius Ditch  PATIENT REVIEW QUESTIONS: The patients wife responded to the following health history questions as indicated:    1. Are you having any GI issues? yes (positive cologuard) 2. Do you have a personal history of Polyps? unsure 3. Do you have a family history of Colon Cancer or Polyps? no 4. Diabetes Mellitus? no 5. Joint replacements in the past 12 months?no joints replaced.  Bronchoscopy performed October 2021 6. Major health problems in the past 3 months?bronchoscopy performed 2021 7. Any artificial heart valves, MVP, or defibrillator?no    MEDICATIONS & ALLERGIES:    Patient reports the following regarding taking any anticoagulation/antiplatelet therapy:   Plavix, Coumadin, Eliquis, Xarelto, Lovenox, Pradaxa, Brilinta, or Effient? yes (Eliquis prescribed by Dr. Manuella Ghazi. Blood thinner advice sent.) Aspirin? yes (81 mg daily)  Patient confirms/reports the following medications:  Current Outpatient Medications  Medication Sig Dispense Refill  . apixaban (ELIQUIS) 5 MG TABS tablet Take 5 mg by mouth in the morning and at bedtime.    [name] aspirin EC 81 MG tablet Take 81 mg by mouth daily. Swallow whole.    [name] atorvastatin (LIPITOR) 40 MG tablet TAKE 1 TABLET(40 MG) BY MOUTH AT BEDTIME 90 tablet 0  . budesonide-formoterol (SYMBICORT) 160-4.5 MCG/ACT inhaler Inhale 2 puffs into the lungs 2 (two) times daily.    . carvedilol (COREG) 25 MG tablet Take 12.5 mg by mouth in the morning and at bedtime.    . furosemide (LASIX) 20 MG tablet Take 20-40 mg by mouth daily as needed for fluid.     [name] ipratropium-albuterol (DUONEB) 0.5-2.5 (3) MG/3ML SOLN Inhale 3 mLs into the lungs every 6 (six) hours as needed. Wheezing shortness of breath    . Lidocaine HCl-Benzyl Alcohol (SALONPAS LIDOCAINE PLUS EX) Place 1 patch onto the skin daily as needed (pain.).    [name] sacubitril-valsartan (ENTRESTO) 49-51 MG Take 0.5 tablets  by mouth in the morning and at bedtime.    [name] spironolactone (ALDACTONE) 25 MG tablet Take 12.5 mg by mouth at bedtime.    . tamsulosin (FLOMAX) 0.4 MG CAPS capsule TAKE 1 CAPSULE(0.4 MG) BY MOUTH DAILY 90 capsule 0   No current facility-administered medications for this visit.    Patient confirms/reports the following allergies:  No Known Allergies  No orders of the defined types were placed in this encounter.   AUTHORIZATION INFORMATION Primary Insurance: 1D#: Group #:  Secondary Insurance: 1D#: Group #:  SCHEDULE INFORMATION: Date: 07/23/20 Time: Location:[facility]

## 2020-06-23 NOTE — Telephone Encounter (Signed)
Called report  Study Result  Narrative & Impression  CLINICAL DATA:  Bilateral lower extremity swelling  EXAM: BILATERAL LOWER EXTREMITY VENOUS DOPPLER ULTRASOUND  TECHNIQUE: Gray-scale sonography with graded compression, as well as color Doppler and duplex ultrasound were performed to evaluate the lower extremity deep venous systems from the level of the common femoral vein and including the common femoral, femoral, profunda femoral, popliteal and calf veins including the posterior tibial, peroneal and gastrocnemius veins when visible. The superficial great saphenous vein was also interrogated. Spectral Doppler was utilized to evaluate flow at rest and with distal augmentation maneuvers in the common femoral, femoral and popliteal veins.  COMPARISON:  None.  FINDINGS: RIGHT LOWER EXTREMITY  Common Femoral Vein: No evidence of thrombus. Normal compressibility, respiratory phasicity and response to augmentation.  Saphenofemoral Junction: No evidence of thrombus. Normal compressibility and flow on color Doppler imaging.  Profunda Femoral Vein: No evidence of thrombus. Normal compressibility and flow on color Doppler imaging.  Femoral Vein: No evidence of thrombus. Normal compressibility, respiratory phasicity and response to augmentation.  Popliteal Vein: No evidence of thrombus. Normal compressibility, respiratory phasicity and response to augmentation.  Calf Veins: No evidence of thrombus. Normal compressibility and flow on color Doppler imaging.  Superficial Great Saphenous Vein: No evidence of thrombus. Normal compressibility.  Venous Reflux:  None.  Other Findings:  None.  LEFT LOWER EXTREMITY  Common Femoral Vein: No evidence of thrombus. Normal compressibility, respiratory phasicity and response to augmentation.  Saphenofemoral Junction: No evidence of thrombus. Normal compressibility and flow on color Doppler imaging.  Profunda Femoral  Vein: No evidence of thrombus. Normal compressibility and flow on color Doppler imaging.  Femoral Vein: No evidence of thrombus. Normal compressibility, respiratory phasicity and response to augmentation.  Popliteal Vein: No evidence of thrombus. Normal compressibility, respiratory phasicity and response to augmentation.  Calf Veins: No evidence of thrombus. Normal compressibility and flow on color Doppler imaging.  Superficial Great Saphenous Vein: No evidence of thrombus. Normal compressibility.  Venous Reflux:  None.  Other Findings:  None.  IMPRESSION: No evidence of deep venous thrombosis in either lower extremity.   Electronically Signed   By: Davina Poke D.O.   On: 06/23/2020 13:43

## 2020-06-23 NOTE — Telephone Encounter (Signed)
Patient returning call.

## 2020-06-23 NOTE — Telephone Encounter (Signed)
Kate Sable, MD  Kavin Leech, RN; Emily Filbert, RN Just now (4:50 PM)   Have patient start Lasix 40 mg daily, check BMP in 7 days. Thank you   Routing comment     No answer. Left message to call back.

## 2020-06-23 NOTE — Telephone Encounter (Signed)
Pt c/o swelling: STAT is pt has developed SOB within 24 hours  1) How much weight have you gained and in what time span? Was 206 and at last appt 12/06 he was 225  2) If swelling, where is the swelling located? In feet and toes    3) Are you currently taking a fluid pill? Has not, states that the lasix they have is really old   4) Are you currently SOB? Always SOB - has COPD   5) Do you have a log of your daily weights (if so, list)? no  6) Have you gained 3 pounds in a day or 5 pounds in a week?   7) Have you traveled recently?

## 2020-06-23 NOTE — Telephone Encounter (Signed)
Dr. Garen Lah,  This patient's wife is calling today.  I just wanted to run this by you prior to calling them. It looks like she is calling about his weight being ~ 206 lbs at the beginning of October (from what I can see) and he is now 225 lbs as of his office visit with you on 06/16/20.  He had an echo on 02/01/20 that showed his EF was 60-65%.  Message left states he has swelling in the feet and toes with orders for lasix PRN. I'm not sure how old the RX is.  Based on your most recent in office assessment, I'm not really sure what I could advise him to do about the weight gain.  Looks like he just finished up radiation treatments.   Thoughts?

## 2020-06-23 NOTE — Telephone Encounter (Signed)
Per Dr. Janese Banks- will order bilateral lower extremity doppler to r/u DVT.

## 2020-06-23 NOTE — Telephone Encounter (Signed)
BLE ultrasound scheduled today at 1245 in [place]. Pt's wife made aware.

## 2020-06-23 NOTE — Telephone Encounter (Signed)
Pt is having swelling in both feet. Ankles are not swollen only feet/toes. Right foot is worse than the left. Pt's wife has noted pt to have weight gain and fatigue recently as well.   Advised pt's wife to contact his cardiologist since his symptoms could be cardiac related.   Do you have any other recommendations at this time?

## 2020-06-23 NOTE — Telephone Encounter (Signed)
I spoke with Mrs. Goertz. I have given her Dr. Thereasa Solo recommendations to: 1) Start lasix 40 mg once daily 2) Repeat a BMP in 7 days at the [facility]  Mrs. Noteboom voices understanding and is agreeable. She has also been advised to have the patient weight daily and record these readings.

## 2020-06-25 ENCOUNTER — Ambulatory Visit: Payer: Medicare Other | Admitting: Nurse Practitioner

## 2020-06-30 ENCOUNTER — Telehealth: Payer: Self-pay

## 2020-06-30 NOTE — Telephone Encounter (Signed)
LVM on patients cell number advising per Dr. Manuella Ghazi to stop Eliquis 5 days prior to Colonoscopy scheduled on 07/23/20 restart 2 days after procedure.  Thanks,  [place], [place]

## 2020-07-01 ENCOUNTER — Other Ambulatory Visit: Payer: Self-pay

## 2020-07-01 ENCOUNTER — Other Ambulatory Visit
Admission: RE | Admit: 2020-07-01 | Discharge: 2020-07-01 | Disposition: A | Discharge status: Home / Self Care | Payer: Medicare Other | Source: Home / Self Care | Attending: Cardiology | Admitting: Cardiology

## 2020-07-01 ENCOUNTER — Ambulatory Visit
Admission: RE | Admit: 2020-07-01 | Discharge: 2020-07-01 | Disposition: A | Discharge status: Home / Self Care | Payer: Medicare Other | Source: Ambulatory Visit | Attending: Oncology | Admitting: Oncology

## 2020-07-01 DIAGNOSIS — Z79899 Other long term (current) drug therapy: Secondary | ICD-10-CM

## 2020-07-01 DIAGNOSIS — C3412 Malignant neoplasm of upper lobe, left bronchus or lung: Secondary | ICD-10-CM | POA: Diagnosis not present

## 2020-07-01 DIAGNOSIS — I5020 Unspecified systolic (congestive) heart failure: Secondary | ICD-10-CM

## 2020-07-01 DIAGNOSIS — J439 Emphysema, unspecified: Secondary | ICD-10-CM | POA: Diagnosis not present

## 2020-07-01 LAB — BASIC METABOLIC PANEL
Anion gap: 8 (ref 5–15)
BUN: 28 mg/dL — ABNORMAL HIGH (ref 8–23)
CO2: 29 mmol/L (ref 22–32)
Calcium: 9.5 mg/dL (ref 8.9–10.3)
Chloride: 104 mmol/L (ref 98–111)
Creatinine, Ser: 1.45 mg/dL — ABNORMAL HIGH (ref 0.61–1.24)
GFR, Estimated: 52 mL/min — ABNORMAL LOW (ref >60)
Glucose, Bld: 66 mg/dL — ABNORMAL LOW (ref 70–99)
Potassium: 4 mmol/L (ref 3.5–5.1)
Sodium: 141 mmol/L (ref 135–145)

## 2020-07-03 ENCOUNTER — Encounter: Payer: Self-pay | Admitting: Oncology

## 2020-07-03 ENCOUNTER — Inpatient Hospital Stay: Payer: Medicare Other | Attending: Oncology | Admitting: Oncology

## 2020-07-03 VITALS — BP 148/72 | HR 63 | Temp 98.4°F | Resp 16 | Ht 70.0 in | Wt 213.0 lb

## 2020-07-03 DIAGNOSIS — I10 Essential (primary) hypertension: Secondary | ICD-10-CM | POA: Diagnosis not present

## 2020-07-03 DIAGNOSIS — Z86711 Personal history of pulmonary embolism: Secondary | ICD-10-CM | POA: Diagnosis not present

## 2020-07-03 DIAGNOSIS — I2510 Atherosclerotic heart disease of native coronary artery without angina pectoris: Secondary | ICD-10-CM | POA: Insufficient documentation

## 2020-07-03 DIAGNOSIS — N189 Chronic kidney disease, unspecified: Secondary | ICD-10-CM | POA: Insufficient documentation

## 2020-07-03 DIAGNOSIS — Z79899 Other long term (current) drug therapy: Secondary | ICD-10-CM | POA: Insufficient documentation

## 2020-07-03 DIAGNOSIS — F418 Other specified anxiety disorders: Secondary | ICD-10-CM | POA: Insufficient documentation

## 2020-07-03 DIAGNOSIS — Z923 Personal history of irradiation: Secondary | ICD-10-CM | POA: Insufficient documentation

## 2020-07-03 DIAGNOSIS — Z7901 Long term (current) use of anticoagulants: Secondary | ICD-10-CM | POA: Diagnosis not present

## 2020-07-03 DIAGNOSIS — C3412 Malignant neoplasm of upper lobe, left bronchus or lung: Secondary | ICD-10-CM | POA: Diagnosis not present

## 2020-07-03 DIAGNOSIS — D649 Anemia, unspecified: Secondary | ICD-10-CM | POA: Diagnosis not present

## 2020-07-03 DIAGNOSIS — I129 Hypertensive chronic kidney disease with stage 1 through stage 4 chronic kidney disease, or unspecified chronic kidney disease: Secondary | ICD-10-CM | POA: Diagnosis not present

## 2020-07-03 DIAGNOSIS — Z87891 Personal history of nicotine dependence: Secondary | ICD-10-CM | POA: Insufficient documentation

## 2020-07-03 DIAGNOSIS — R195 Other fecal abnormalities: Secondary | ICD-10-CM

## 2020-07-03 DIAGNOSIS — I714 Abdominal aortic aneurysm, without rupture: Secondary | ICD-10-CM | POA: Insufficient documentation

## 2020-07-03 DIAGNOSIS — Z8673 Personal history of transient ischemic attack (TIA), and cerebral infarction without residual deficits: Secondary | ICD-10-CM | POA: Insufficient documentation

## 2020-07-03 DIAGNOSIS — N400 Enlarged prostate without lower urinary tract symptoms: Secondary | ICD-10-CM | POA: Diagnosis not present

## 2020-07-03 DIAGNOSIS — J449 Chronic obstructive pulmonary disease, unspecified: Secondary | ICD-10-CM | POA: Insufficient documentation

## 2020-07-03 DIAGNOSIS — I480 Paroxysmal atrial fibrillation: Secondary | ICD-10-CM | POA: Diagnosis not present

## 2020-07-03 NOTE — Progress Notes (Signed)
Pt here to get ct results. He has swelling of legs and and lethargic last week and they wondered if it was from radiation that he had just finished. He has sleep habits of when he worked and had to be up early and he has not got out of that cycle. bowel movement good. At this time pt does not have cardiac or breathing issues at this time but does have lung disease

## 2020-07-06 NOTE — Progress Notes (Signed)
Hematology/Oncology Consult note [facility]  Telephone:([phone] [phone]  Patient Care Team: Chrismon, Driscilla Grammes as PCP - General (Family Medicine) Telford Nab, RN as Oncology Nurse Navigator   Name of the patient: Richard Wiggins  [id number]  20-Jun-1951   Date of visit: 07/06/20  Diagnosis-stage I lung cancer s/p SBRT  Chief complaint/ Reason for visit-discuss CT scan results  Heme/Onc history:  Patient is a 68 year old male who was a former smoker and quit in 2018.  He went to the ER in July 2021 for possible strokelike symptoms and was found to have a left upper lobe lung nodule which was followed up with a CT chest which showed a 1.2 x 1.1 cm left upper lobe spiculated nodule.  This was followed by a PET scan which showed that nodule was hypermetabolic with an SUV of 6.14 patient was also incidentally noted to have a 4.3 cm abdominal aortic aneurysm.  He follows up with Dr. Lucky Cowboy for his aneurysm.  He was seen by Dr. Patsey Berthold and underwent ENB.  Biopsy was nondiagnostic.  However given that the nodule was hypermetabolic it was concerning for malignancy and recommendation per Dr. Patsey Berthold was empiric radiation. Patient underwent SBRT to his left upper lobe by Dr. Baruch Gouty  Patient also has a positive fit test but has not yet undergone a colonoscopy  Interval history-patient has baseline fatigue and shortness of breath. No new complaints at this time  ECOG PS- 1 Pain scale- 3  Review of systems- Review of Systems  Constitutional: Positive for malaise/fatigue. Negative for chills, fever and weight loss.  HENT: Negative for congestion, ear discharge and nosebleeds.   Eyes: Negative for blurred vision.  Respiratory: Positive for shortness of breath. Negative for cough, hemoptysis, sputum production and wheezing.   Cardiovascular: Negative for chest pain, palpitations, orthopnea and claudication.  Gastrointestinal: Negative for abdominal  pain, blood in stool, constipation, diarrhea, heartburn, melena, nausea and vomiting.  Genitourinary: Negative for dysuria, flank pain, frequency, hematuria and urgency.  Musculoskeletal: Negative for back pain, joint pain and myalgias.  Skin: Negative for rash.  Neurological: Negative for dizziness, tingling, focal weakness, seizures, weakness and headaches.  Endo/Heme/Allergies: Does not bruise/bleed easily.  Psychiatric/Behavioral: Negative for depression and suicidal ideas. The patient does not have insomnia.       No Known Allergies   Past Medical History:  Diagnosis Date  . 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 Anemia 02/09/2020  . Anxiety   . Benign essential HTN 02/10/2020  . BPH (benign prostatic hyperplasia) 02/09/2020  . Brain aneurysm   . Chronic kidney disease   . COPD (chronic obstructive pulmonary disease) ([place])   . Depression   . Emphysema of lung ([place])   . History of common carotid artery stent placement   . Hypertension   . IDA (iron deficiency anemia) 02/09/2020  . 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 Marijuana use, continuous 02/10/2020  . PAF (paroxysmal atrial fibrillation) ([place])   . Pulmonary emboli ([place]) 06/07/2017   Right lower lobe pulmonary embolism small segmental, multifocal multifocal pneumonia, mediastinal lymphadenopathy, moderate centrilobular emphysema  . Stroke [facility]    left sided weakness to arm and leg     Past Surgical History:  Procedure Laterality Date  . brain aneurysm with clip    . CORONARY ANGIOPLASTY WITH STENT PLACEMENT  07/08/2017  . hip repleacement Right    Right  total hip arthroplasty  . VIDEO BRONCHOSCOPY WITH ENDOBRONCHIAL NAVIGATION N/A 04/23/2020   Procedure: VIDEO BRONCHOSCOPY WITH ENDOBRONCHIAL NAVIGATION;  Surgeon: Tyler Pita, MD;  Location: [facility] ORS;  Service: Pulmonary;  Laterality: N/A;    Social History    Socioeconomic History  . Marital status: Married    Spouse name: Not on file  . Number of children: Not on file  . Years of education: Not on file  . Highest education level: High school graduate  Occupational History  . Occupation: Retired  Tobacco Use  . Smoking status: Former Smoker    Packs/day: 2.00    Years: 53.00    Pack years: 106.00    Types: Cigarettes    Quit date: 05/28/2017    Years since quitting: 3.1  . Smokeless tobacco: Never Used  Vaping Use  . Vaping Use: Former  . Quit date: 01/15/2020  . Devices: cbd   Substance and Sexual Activity  . Alcohol use: Yes    Comment: socially drink cocktail  . Drug use: Yes    Types: Marijuana    Comment: last smoke x1 month ago  . Sexual activity: Not Currently  Other Topics Concern  . Not on file  Social History Narrative   Lives with wife. 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 Children x2 children and grandchildren grown.    Social Determinants of Health   Financial Resource Strain: Not on file  Food Insecurity: Not on file  Transportation Needs: Not on file  Physical Activity: Not on file  Stress: Not on file  Social Connections: Not on file  Intimate Partner Violence: Not on file    Family History  Problem Relation Age of Onset  . Diabetes Mother   . Heart disease Mother   . Stroke Father   . Heart disease Father   . Heart attack Father   . Alcohol abuse Father   . Heart disease Sister   . Heart attack Sister   . Heart disease Maternal Grandmother   . Heart attack Maternal Grandmother   . Heart attack Paternal Grandmother   . Diabetes Brother   . Heart disease Brother   . Heart attack Brother      Current Outpatient Medications:  .  apixaban (ELIQUIS) 5 MG TABS tablet, Take 5 mg by mouth in the morning and at bedtime., Disp: , Rfl:  .  aspirin EC 81 MG tablet, Take 81 mg by mouth daily. Swallow whole., Disp: , Rfl:  .  atorvastatin (LIPITOR) 40 MG tablet, TAKE 1 TABLET(40 MG) BY MOUTH  AT BEDTIME, Disp: 90 tablet, Rfl: 0 .  budesonide-formoterol (SYMBICORT) 160-4.5 MCG/ACT inhaler, Inhale 2 puffs into the lungs 2 (two) times daily., Disp: , Rfl:  .  carvedilol (COREG) 25 MG tablet, Take 12.5 mg by mouth in the morning and at bedtime., Disp: , Rfl:  .  furosemide (LASIX) 40 MG tablet, Take 1 tablet (40 mg total) by mouth daily., Disp: 30 tablet, Rfl: 3 .  Lidocaine HCl-Benzyl Alcohol (SALONPAS LIDOCAINE PLUS EX), Place 1 patch onto the skin daily as needed (pain.)., Disp: , Rfl:  .  sacubitril-valsartan (ENTRESTO) 49-51 MG, Take 0.5 tablets by mouth in the morning and at bedtime., Disp: , Rfl:  .  spironolactone (ALDACTONE) 25 MG tablet, Take 12.5 mg by mouth at bedtime., Disp: , Rfl:  .  tamsulosin (FLOMAX) 0.4 MG CAPS capsule, TAKE 1 CAPSULE(0.4 MG) BY MOUTH DAILY, Disp: 90 capsule, Rfl: 0 .  ipratropium-albuterol (DUONEB) 0.5-2.5 (3) MG/3ML  SOLN, Inhale 3 mLs into the lungs every 6 (six) hours as needed. Wheezing shortness of breath (Patient not taking: Reported on 07/03/2020), Disp: , Rfl:   Physical exam:  Vitals:   07/03/20 1505  BP: (!) 148/72  Pulse: 63  Resp: 16  Temp: 98.4 F (36.9 C)  TempSrc: Oral  Weight: 213 lb (96.6 kg)  Height: 5\' 10"  (1.778 m)   Physical Exam Constitutional:      General: He is not in acute distress. Eyes:     Extraocular Movements: EOM normal.     Pupils: Pupils are equal, round, and reactive to light.  Cardiovascular:     Rate and Rhythm: Normal rate and regular rhythm.     Heart sounds: Normal heart sounds.  Pulmonary:     Effort: Pulmonary effort is normal.     Comments: Breath sounds decreased over bases Abdominal:     General: Bowel sounds are normal.     Palpations: Abdomen is soft.  Musculoskeletal:     Cervical back: Normal range of motion.  Skin:    General: Skin is warm and dry.  Neurological:     Mental Status: He is alert and oriented to person, place, and time.      CMP Latest Ref Rng & Units 07/01/2020   Glucose 70 - 99 mg/dL 66(L)  BUN 8 - 23 mg/dL 28(H)  Creatinine 0.61 - 1.24 mg/dL 1.45(H)  Sodium 135 - 145 mmol/L 141  Potassium 3.5 - 5.1 mmol/L 4.0  Chloride 98 - 111 mmol/L 104  CO2 22 - 32 mmol/L 29  Calcium 8.9 - 10.3 mg/dL 9.5  Total Protein 6.0 - 8.3 g/dL -  Total Bilirubin 0.2 - 1.2 mg/dL -  Alkaline Phos 39 - 117 U/L -  AST 0 - 37 U/L -  ALT 0 - 53 U/L -   CBC Latest Ref Rng & Units 05/27/2020  WBC 4.0 - 10.5 K/uL 4.6  Hemoglobin 13.0 - 17.0 g/dL 12.1(L)  Hematocrit 39.0 - 52.0 % 36.4(L)  Platelets 150.0 - 400.0 K/uL 159.0    No images are attached to the encounter.  CT Chest Wo Contrast  Result Date: 07/01/2020 CLINICAL DATA:  Stage I non-small cell left upper lobe lung cancer. Post radiation therapy. EXAM: CT CHEST WITHOUT CONTRAST TECHNIQUE: Multidetector CT imaging of the chest was performed following the standard protocol without IV contrast. COMPARISON:  Chest CT 04/17/2020 and 02/02/2020.  PET-CT 02/12/2020. FINDINGS: Cardiovascular: Extensive three-vessel coronary artery atherosclerosis with lesser involvement of the aorta and great vessels. No acute vascular findings on noncontrast imaging. The heart size is normal. There is no pericardial effusion. Mediastinum/Nodes: There are no enlarged mediastinal, hilar or axillary lymph nodes.Hilar assessment is limited by the lack of intravenous contrast, although the hilar contours appear unchanged. The thyroid gland, trachea and esophagus demonstrate no significant findings. Lungs/Pleura: There is no pleural effusion. Mild centrilobular and paraseptal emphysema with diffuse central airway thickening. The dominant nodule posteriorly in the left upper lobe is slightly less distinct following radiation therapy, but not significantly changed in overall size, measuring approximately 2.1 x 1.3 x 0.6 cm. 0.7 x 0.4 cm right upper lobe nodule on image 37/3 is unchanged. No new or enlarging nodules identified. Upper abdomen: The  visualized upper abdomen appears stable without significant findings. There is a stable cyst in the upper pole of the left kidney. Musculoskeletal/Chest wall: There is no chest wall mass or suspicious osseous finding. Stable degenerative changes in the spine and mild bilateral gynecomastia.  IMPRESSION: 1. The dominant left upper lobe nodule is slightly less distinct following radiation therapy, but not significantly changed in overall size. No evidence of local progression or metastatic disease. Continued follow-up recommended. 2. Stable small right upper lobe pulmonary nodule. 3. Coronary and Aortic Atherosclerosis (ICD10-I70.0) and Emphysema (ICD10-J43.9). Electronically Signed   By: Richardean Sale M.D.   On: 07/01/2020 11:42   US Venous Img Lower Bilateral  Result Date: 06/23/2020 CLINICAL DATA:  Bilateral lower extremity swelling EXAM: BILATERAL LOWER EXTREMITY VENOUS DOPPLER ULTRASOUND TECHNIQUE: Gray-scale sonography with graded compression, as well as color Doppler and duplex ultrasound were performed to evaluate the lower extremity deep venous systems from the level of the common femoral vein and including the common femoral, femoral, profunda femoral, popliteal and calf veins including the posterior tibial, peroneal and gastrocnemius veins when visible. The superficial great saphenous vein was also interrogated. Spectral Doppler was utilized to evaluate flow at rest and with distal augmentation maneuvers in the common femoral, femoral and popliteal veins. COMPARISON:  None. FINDINGS: RIGHT LOWER EXTREMITY Common Femoral Vein: No evidence of thrombus. Normal compressibility, respiratory phasicity and response to augmentation. Saphenofemoral Junction: No evidence of thrombus. Normal compressibility and flow on color Doppler imaging. Profunda Femoral Vein: No evidence of thrombus. Normal compressibility and flow on color Doppler imaging. Femoral Vein: No evidence of thrombus. Normal compressibility,  respiratory phasicity and response to augmentation. Popliteal Vein: No evidence of thrombus. Normal compressibility, respiratory phasicity and response to augmentation. Calf Veins: No evidence of thrombus. Normal compressibility and flow on color Doppler imaging. Superficial Great Saphenous Vein: No evidence of thrombus. Normal compressibility. Venous Reflux:  None. Other Findings:  None. LEFT LOWER EXTREMITY Common Femoral Vein: No evidence of thrombus. Normal compressibility, respiratory phasicity and response to augmentation. Saphenofemoral Junction: No evidence of thrombus. Normal compressibility and flow on color Doppler imaging. Profunda Femoral Vein: No evidence of thrombus. Normal compressibility and flow on color Doppler imaging. Femoral Vein: No evidence of thrombus. Normal compressibility, respiratory phasicity and response to augmentation. Popliteal Vein: No evidence of thrombus. Normal compressibility, respiratory phasicity and response to augmentation. Calf Veins: No evidence of thrombus. Normal compressibility and flow on color Doppler imaging. Superficial Great Saphenous Vein: No evidence of thrombus. Normal compressibility. Venous Reflux:  None. Other Findings:  None. IMPRESSION: No evidence of deep venous thrombosis in either lower extremity. Electronically Signed   By: Davina Poke D.O.   On: 06/23/2020 13:43     Assessment and plan- Patient is a 69 y.o. male with left upper lobe stage I presumed lung cancer s/p SBRT here to discuss CT scan results  CT scan presently shows stable size of left upper lobe lung nodule. No evidence of progressive disease. Discussed with the patient that despite radiation lung nodules can persist but that does not mean that he has active cancer. Plan is to continue surveillance of this nodule every 3 to 6 months. I will see him back tentatively in 3 months time and if the nodule is stable I will plan to get scans every 6 months  Positive fit test: Patient  will be undergoing colonoscopy with Dr. Marius Ditch on 07/23/2020   Visit Diagnosis 1. Malignant neoplasm of upper lobe of left lung ([place])   2. Positive FIT (fecal immunochemical test)      Dr. Randa Evens, MD, MPH [facility] [phone] 07/06/2020 6:48 PM

## 2020-07-07 ENCOUNTER — Encounter: Payer: Self-pay | Admitting: Cardiology

## 2020-07-07 ENCOUNTER — Other Ambulatory Visit: Payer: Self-pay

## 2020-07-07 ENCOUNTER — Ambulatory Visit: Payer: Medicare Other | Admitting: Cardiology

## 2020-07-07 VITALS — BP 126/70 | HR 66 | Ht 70.0 in | Wt 218.2 lb

## 2020-07-07 DIAGNOSIS — I480 Paroxysmal atrial fibrillation: Secondary | ICD-10-CM

## 2020-07-07 DIAGNOSIS — I639 Cerebral infarction, unspecified: Secondary | ICD-10-CM

## 2020-07-07 DIAGNOSIS — I2510 Atherosclerotic heart disease of native coronary artery without angina pectoris: Secondary | ICD-10-CM | POA: Diagnosis not present

## 2020-07-07 DIAGNOSIS — I5020 Unspecified systolic (congestive) heart failure: Secondary | ICD-10-CM | POA: Diagnosis not present

## 2020-07-07 MED ORDER — FUROSEMIDE 40 MG PO TABS: 40.0000 mg | ORAL_TABLET | ORAL | 3 refills | Status: DC | PRN

## 2020-07-07 NOTE — Progress Notes (Signed)
Cardiology Office Note:    Date:  07/07/2020   ID:  Richard Wiggins, DOB [birth year], MRN [id number]  PCP:  Margo Common, PA-C  [place] Cardiologist:  No primary care provider on file.  [facility] Electrophysiologist:  None   Referring MD: Margo Common, PA-C   Chief Complaint  Patient presents with  . Other    2 wk f/u no complaints today. Meds reviewed verbally with pt.    History of Present Illness:    Richard Wiggins is a 69 y.o. 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 fib on Eliquis, CAD s/p PCI to RCA 2018 (LAD 50%, LCx 20%) in [place], former smoker x40+ years, COPD, neoplasm left upper lobe, Who presents for follow-up.  Being seen for paroxysmal A. fib, CAD, prior CHF with reduced EF, now normalized.  States having weight gain and swelling in his feet.  Was 206 at last appointment, weight at home 225 pounds.  Patient was advised to start Lasix 40 mg daily, check BMP in 7 days.  He just had radiation therapy for left upper lobe lung mass.  His swelling and weight has improved since starting Lasix.  Also endorses not being compliant with salt intake.   Prior notes Patient's her prior echocardiogram in 06/08/2017 in [place] noted with EF 30 to 35%.  Underwent a left heart catheter coronary in 2018 showing severe stenosis in the 85%.  He underwent PCI to the RCA with drug-eluting stent.  During that hospitalization, EKG/telemetry monitoring showed paroxysmal A. fib.  Also had a stroke during that hospitalization and CEA was performed.  He was subsequently placed on Eliquis.   Echocardiogram on 02/01/2020 showed normal systolic function, EF 60 to 78%, normal diastolic function.  Past Medical History:  Diagnosis Date  . 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 Anemia 02/09/2020   . Anxiety   . Benign essential HTN 02/10/2020  . BPH (benign prostatic hyperplasia) 02/09/2020  . Brain aneurysm   . Chronic kidney disease   . COPD (chronic obstructive pulmonary disease) ([place])   . Depression   . Emphysema of lung ([place])   . History of common carotid artery stent placement   . Hypertension   . IDA (iron deficiency anemia) 02/09/2020  . 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 Marijuana use, continuous 02/10/2020  . PAF (paroxysmal atrial fibrillation) ([place])   . Pulmonary emboli ([place]) 06/07/2017   Right lower lobe pulmonary embolism small segmental, multifocal multifocal pneumonia, mediastinal lymphadenopathy, moderate centrilobular emphysema  . Stroke [facility])    left sided weakness to arm and leg    Past Surgical History:  Procedure Laterality Date  . brain aneurysm with clip    . CORONARY ANGIOPLASTY WITH STENT PLACEMENT  07/08/2017  . hip repleacement Right    Right total hip arthroplasty  . VIDEO BRONCHOSCOPY WITH ENDOBRONCHIAL NAVIGATION N/A 04/23/2020   Procedure: VIDEO BRONCHOSCOPY WITH ENDOBRONCHIAL NAVIGATION;  Surgeon: Tyler Pita, MD;  Location: [facility] ORS;  Service: Pulmonary;  Laterality: N/A;    Current Medications: Current Meds  Medication Sig  . apixaban (ELIQUIS) 5 MG TABS tablet Take 5 mg by mouth in the morning and at bedtime.  [name] aspirin EC 81 MG tablet Take 81 mg by mouth daily. Swallow whole.  [name] atorvastatin (LIPITOR) 40 MG tablet TAKE  1 TABLET(40 MG) BY MOUTH AT BEDTIME  . budesonide-formoterol (SYMBICORT) 160-4.5 MCG/ACT inhaler Inhale 2 puffs into the lungs 2 (two) times daily.  . carvedilol (COREG) 25 MG tablet Take 12.5 mg by mouth in the morning and at bedtime.  [name] ipratropium-albuterol (DUONEB) 0.5-2.5 (3) MG/3ML SOLN Inhale 3 mLs into the lungs every 6 (six) hours as needed. Wheezing shortness of breath  . Lidocaine HCl-Benzyl Alcohol (SALONPAS LIDOCAINE PLUS EX) Place 1 patch onto the skin daily as needed (pain.).  [name]  sacubitril-valsartan (ENTRESTO) 49-51 MG Take 0.5 tablets by mouth in the morning and at bedtime.  [name] spironolactone (ALDACTONE) 25 MG tablet Take 12.5 mg by mouth at bedtime.  . tamsulosin (FLOMAX) 0.4 MG CAPS capsule TAKE 1 CAPSULE(0.4 MG) BY MOUTH DAILY  . [DISCONTINUED] furosemide (LASIX) 40 MG tablet Take 1 tablet (40 mg total) by mouth daily. (Patient taking differently: Take 40 mg by mouth as needed.)     Allergies:   Patient has no known allergies.   Social History   Socioeconomic History  . Marital status: Married    Spouse name: Not on file  . Number of children: Not on file  . Years of education: Not on file  . Highest education level: High school graduate  Occupational History  . Occupation: Retired  Tobacco Use  . Smoking status: Former Smoker    Packs/day: 2.00    Years: 53.00    Pack years: 106.00    Types: Cigarettes    Quit date: 05/28/2017    Years since quitting: 3.1  . Smokeless tobacco: Never Used  Vaping Use  . Vaping Use: Former  . Quit date: 01/15/2020  . Devices: cbd   Substance and Sexual Activity  . Alcohol use: Yes    Comment: socially drink cocktail  . Drug use: Yes    Types: Marijuana    Comment: last smoke x1 month ago  . Sexual activity: Not Currently  Other Topics Concern  . Not on file  Social History Narrative   Lives with wife. 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 Children x2 children and grandchildren grown.    Social Determinants of Health   Financial Resource Strain: Not on file  Food Insecurity: Not on file  Transportation Needs: Not on file  Physical Activity: Not on file  Stress: Not on file  Social Connections: Not on file     Family History: The patient's family history includes Alcohol abuse in his father; Diabetes in his brother and mother; Heart attack in his brother, father, maternal grandmother, paternal grandmother, and sister; Heart disease in his brother, father, maternal grandmother, mother, and sister;  Stroke in his father.  ROS:   Please see the history of present illness.     All other systems reviewed and are negative.  EKGs/Labs/Other Studies Reviewed:    The following studies were reviewed today:   EKG:  EKG is  ordered today.  The ekg ordered today demonstrates normal sinus rhythm, normal ECG.  Recent Labs: 02/01/2020: B Natriuretic Peptide 293.0; Magnesium 1.9; TSH 2.454 05/27/2020: ALT 11; Hemoglobin 12.1; Platelets 159.0 07/01/2020: BUN 28; Creatinine, Ser 1.45; Potassium 4.0; Sodium 141  Recent Lipid Panel    Component Value Date/Time   CHOL 116 05/27/2020 0802   TRIG 70.0 05/27/2020 0802   HDL 46.10 05/27/2020 0802   CHOLHDL 3 05/27/2020 0802   VLDL 14.0 05/27/2020 0802   LDLCALC 56 05/27/2020 0802    Physical Exam:    VS:  BP 126/70 (BP  Location: Left Arm, Patient Position: Sitting, Cuff Size: Normal)   Pulse 66   Ht 5\' 10"  (1.778 m)   Wt 218 lb 4 oz (99 kg)   SpO2 92%   BMI 31.32 kg/m     Wt Readings from Last 3 Encounters:  07/07/20 218 lb 4 oz (99 kg)  07/03/20 213 lb (96.6 kg)  06/16/20 225 lb (102.1 kg)     GEN:  Well nourished, well developed in no acute distress HEENT: Normal NECK: No JVD; No carotid bruits LYMPHATICS: No lymphadenopathy CARDIAC: RRR, no murmurs, rubs, gallops RESPIRATORY:  Clear to auscultation without rales, wheezing or rhonchi  ABDOMEN: Soft, non-tender, non-distended MUSCULOSKELETAL:  1+ edema; No deformity  SKIN: Warm and dry NEUROLOGIC:  Alert and oriented x 3 PSYCHIATRIC:  Normal affect   ASSESSMENT:    1. Coronary artery disease involving native coronary artery of native heart without angina pectoris   2. HFrEF (heart failure with reduced ejection fraction) (HCC)   3. Paroxysmal atrial fibrillation ([place])   4. Cerebrovascular accident (CVA), unspecified mechanism ([place])    PLAN:    In order of problems listed above:  1. CAD status post PCI to RCA (2018).  Denies chest pain.  Continue aspirin,  statin. 2. History of heart failure reduced EF, last echo 01/2020, normal EF 60 to 65%.  Continue Coreg, Entresto, spironolactone.  With improved after starting Lasix.  Creatinine 1.4.  Patient advised to take Lasix as needed for weight gain of 3 pounds over 24 hours or 5 pounds in a week.  Low-salt diet advised. 3. paroxysmal atrial fibrillation, currently in sinus rhythm.  CHA2DS2-VASc of 5 (age, htn, vasc, cva). Coreg, Eliquis. 4. prior CVA, continue statin, aspirin, and Eliquis.  Follow-up in 6 months.   Medication Adjustments/Labs and Tests Ordered: Current medicines are reviewed at length with the patient today.  Concerns regarding medicines are outlined above.  Orders Placed This Encounter  Procedures  . EKG 12-Lead   Meds ordered this encounter  Medications  . furosemide (LASIX) 40 MG tablet    Sig: Take 1 tablet (40 mg total) by mouth as needed.    Dispense:  30 tablet    Refill:  3    Patient Instructions  Medication Instructions:   Your physician has recommended you make the following change in your medication:   Only take your Lasix when you need it.   *If you need a refill on your cardiac medications before your next appointment, please call your pharmacy*   Lab Work: None Ordered If you have labs (blood work) drawn today and your tests are completely normal, you will receive your results only by: [name] MyChart Message (if you have MyChart) OR . A paper copy in the mail If you have any lab test that is abnormal or we need to change your treatment, we will call you to review the results.   Testing/Procedures: None Ordered   Follow-Up: At [facility], you and your health needs are our priority.  As part of our continuing mission to provide you with exceptional heart care, we have created designated Provider Care Teams.  These Care Teams include your primary Cardiologist (physician) and Advanced Practice Providers (APPs -  Physician Assistants and Nurse  Practitioners) who all work together to provide you with the care you need, when you need it.  We recommend signing up for the patient portal called "MyChart".  Sign up information is provided on this After Visit Summary.  MyChart is used to  connect with patients for Virtual Visits (Telemedicine).  Patients are able to view lab/test results, encounter notes, upcoming appointments, etc.  Non-urgent messages can be sent to your provider as well.   To learn more about what you can do with MyChart, go to [email or web address].    Your next appointment:   6 month(s)  The format for your next appointment:   In Person  Provider:   Kate Sable, MD   Other Instructions     Signed, Kate Sable, MD  07/07/2020 1:01 PM    [place]

## 2020-07-07 NOTE — Patient Instructions (Addendum)
Medication Instructions:   Your physician has recommended you make the following change in your medication:   Only take your Lasix when you need it.   *If you need a refill on your cardiac medications before your next appointment, please call your pharmacy*   Lab Work: None Ordered If you have labs (blood work) drawn today and your tests are completely normal, you will receive your results only by: [name] MyChart Message (if you have MyChart) OR . A paper copy in the mail If you have any lab test that is abnormal or we need to change your treatment, we will call you to review the results.   Testing/Procedures: None Ordered   Follow-Up: At [facility], you and your health needs are our priority.  As part of our continuing mission to provide you with exceptional heart care, we have created designated Provider Care Teams.  These Care Teams include your primary Cardiologist (physician) and Advanced Practice Providers (APPs -  Physician Assistants and Nurse Practitioners) who all work together to provide you with the care you need, when you need it.  We recommend signing up for the patient portal called "MyChart".  Sign up information is provided on this After Visit Summary.  MyChart is used to connect with patients for Virtual Visits (Telemedicine).  Patients are able to view lab/test results, encounter notes, upcoming appointments, etc.  Non-urgent messages can be sent to your provider as well.   To learn more about what you can do with MyChart, go to [email or web address].    Your next appointment:   6 month(s)  The format for your next appointment:   In Person  Provider:   Kate Sable, MD   Other Instructions

## 2020-07-21 ENCOUNTER — Other Ambulatory Visit
Admission: RE | Admit: 2020-07-21 | Discharge: 2020-07-21 | Disposition: A | Discharge status: Home / Self Care | Payer: Medicare Other | Source: Ambulatory Visit | Attending: Gastroenterology | Admitting: Gastroenterology

## 2020-07-21 ENCOUNTER — Other Ambulatory Visit: Payer: Self-pay

## 2020-07-21 ENCOUNTER — Ambulatory Visit: Payer: Medicare Other | Admitting: Radiation Oncology

## 2020-07-21 DIAGNOSIS — I480 Paroxysmal atrial fibrillation: Secondary | ICD-10-CM | POA: Diagnosis not present

## 2020-07-21 DIAGNOSIS — Z20822 Contact with and (suspected) exposure to covid-19: Secondary | ICD-10-CM | POA: Insufficient documentation

## 2020-07-21 DIAGNOSIS — Z8673 Personal history of transient ischemic attack (TIA), and cerebral infarction without residual deficits: Secondary | ICD-10-CM | POA: Diagnosis not present

## 2020-07-21 DIAGNOSIS — I69354 Hemiplegia and hemiparesis following cerebral infarction affecting left non-dominant side: Secondary | ICD-10-CM | POA: Diagnosis not present

## 2020-07-21 DIAGNOSIS — Z9889 Other specified postprocedural states: Secondary | ICD-10-CM | POA: Diagnosis not present

## 2020-07-21 DIAGNOSIS — Z8679 Personal history of other diseases of the circulatory system: Secondary | ICD-10-CM | POA: Diagnosis not present

## 2020-07-21 DIAGNOSIS — Z01812 Encounter for preprocedural laboratory examination: Secondary | ICD-10-CM | POA: Diagnosis not present

## 2020-07-21 DIAGNOSIS — R569 Unspecified convulsions: Secondary | ICD-10-CM | POA: Diagnosis not present

## 2020-07-21 LAB — SARS CORONAVIRUS 2 (TAT 6-24 HRS): SARS Coronavirus 2: NEGATIVE

## 2020-07-22 ENCOUNTER — Ambulatory Visit: Payer: Medicare Other | Admitting: Family Medicine

## 2020-07-22 ENCOUNTER — Encounter: Payer: Self-pay | Admitting: Gastroenterology

## 2020-07-22 NOTE — Progress Notes (Deleted)
New patient visit   Patient: Richard Wiggins   DOB: 1951-07-07   70 y.o. Male  MRN: [id number] Visit Date: 07/22/2020  Today's healthcare provider: Vernie Murders, PA-C   No chief complaint on file.  Subjective    Richard Wiggins is a 70 y.o. male who presents today as a new patient to establish care.  HPI  ***  Past Medical History:  Diagnosis Date  . 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 Anemia 02/09/2020  . Anxiety   . Benign essential HTN 02/10/2020  . BPH (benign prostatic hyperplasia) 02/09/2020  . Brain aneurysm   . Chronic kidney disease   . COPD (chronic obstructive pulmonary disease) ([place])   . Depression   . Emphysema of lung ([place])   . History of common carotid artery stent placement   . Hypertension   . IDA (iron deficiency anemia) 02/09/2020  . 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 Marijuana use, continuous 02/10/2020  . PAF (paroxysmal atrial fibrillation) ([place])   . Pulmonary emboli ([place]) 06/07/2017   Right lower lobe pulmonary embolism small segmental, multifocal multifocal pneumonia, mediastinal lymphadenopathy, moderate centrilobular emphysema  . Stroke [facility])    left sided weakness to arm and leg   Past Surgical History:  Procedure Laterality Date  . brain aneurysm with clip    . CORONARY ANGIOPLASTY WITH STENT PLACEMENT  07/08/2017  . hip repleacement Right    Right total hip arthroplasty  . VIDEO BRONCHOSCOPY WITH ENDOBRONCHIAL NAVIGATION N/A 04/23/2020   Procedure: VIDEO BRONCHOSCOPY WITH ENDOBRONCHIAL NAVIGATION;  Surgeon: Tyler Pita, MD;  Location: [facility] ORS;  Service: Pulmonary;  Laterality: N/A;   Family Status  Relation Name Status  . Mother  Deceased  . Father  Deceased  . Sister Butch Penny Deceased  . Brother [employer]  . Daughter  Alive  . Son  Alive  . MGM  Deceased  . MGF  Deceased  . PGM  Deceased  . PGF  Deceased  . Sister Marriott  . Brother Sonia Side Deceased   Family History  Problem Relation Age of Onset  . Diabetes Mother   . Heart disease Mother   . Stroke Father   . Heart disease Father   . Heart attack Father   . Alcohol abuse Father   . Heart disease Sister   . Heart attack Sister   . Heart disease Maternal Grandmother   . Heart attack Maternal Grandmother   . Heart attack Paternal Grandmother   . Diabetes Brother   . Heart disease Brother   . Heart attack Brother    Social History   Socioeconomic History  . Marital status: Married    Spouse name: Not on file  . Number of children: Not on file  . Years of education: Not on file  . Highest education level: High school graduate  Occupational History  . Occupation: Retired  Tobacco Use  . Smoking status: Former Smoker    Packs/day: 2.00    Years: 53.00    Pack years: 106.00    Types: Cigarettes    Quit date: 05/28/2017    Years since quitting: 3.1  . Smokeless tobacco: Never Used  Vaping Use  . Vaping Use: Former  . Quit date: 01/15/2020  . Devices: cbd   Substance and Sexual Activity  . Alcohol use: Yes    Comment: socially drink cocktail  . Drug use: Yes  Types: Marijuana    Comment: last smoke x1 month ago  . Sexual activity: Not Currently  Other Topics Concern  . Not on file  Social History Narrative   Lives with wife. 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 Children x2 children and grandchildren grown.    Social Determinants of Health   Financial Resource Strain: Not on file  Food Insecurity: Not on file  Transportation Needs: Not on file  Physical Activity: Not on file  Stress: Not on file  Social Connections: Not on file   Outpatient Medications Prior to Visit  Medication Sig  . apixaban (ELIQUIS) 5 MG TABS tablet Take 5 mg by mouth in the morning and at bedtime.  [name] aspirin EC 81 MG tablet Take 81 mg by mouth daily. Swallow whole.  [name] atorvastatin (LIPITOR) 40 MG tablet TAKE 1 TABLET(40 MG) BY MOUTH AT  BEDTIME  . budesonide-formoterol (SYMBICORT) 160-4.5 MCG/ACT inhaler Inhale 2 puffs into the lungs 2 (two) times daily.  . carvedilol (COREG) 25 MG tablet Take 12.5 mg by mouth in the morning and at bedtime.  . furosemide (LASIX) 40 MG tablet Take 1 tablet (40 mg total) by mouth as needed.  [name] ipratropium-albuterol (DUONEB) 0.5-2.5 (3) MG/3ML SOLN Inhale 3 mLs into the lungs every 6 (six) hours as needed. Wheezing shortness of breath  . Lidocaine HCl-Benzyl Alcohol (SALONPAS LIDOCAINE PLUS EX) Place 1 patch onto the skin daily as needed (pain.).  [name] sacubitril-valsartan (ENTRESTO) 49-51 MG Take 0.5 tablets by mouth in the morning and at bedtime.  [name] spironolactone (ALDACTONE) 25 MG tablet Take 12.5 mg by mouth at bedtime.  . tamsulosin (FLOMAX) 0.4 MG CAPS capsule TAKE 1 CAPSULE(0.4 MG) BY MOUTH DAILY   No facility-administered medications prior to visit.   No Known Allergies  Immunization History  Administered Date(s) Administered  . Fluad Quad(high Dose 65+) 03/26/2020  . Influenza,inj,Quad PF,6+ Mos 06/02/2017  . Influenza-Unspecified 04/23/2019  . Moderna Sars-Covid-2 Vaccination 09/12/2019, 10/10/2019  . Pneumococcal Polysaccharide-23 06/02/2017  . Tdap 02/09/2018  . Zoster Recombinat (Shingrix) 05/21/2019, 10/19/2019    Health Maintenance  Topic Date Due  . COLONOSCOPY (Pts 45-44yrs Insurance coverage will need to be confirmed)  Never done  . PNA vac Low Risk Adult (2 of 2 - PCV13) 06/02/2018  . COVID-19 Vaccine (4 - Booster for Moderna series) 10/19/2020  . TETANUS/TDAP  02/10/2028  . INFLUENZA VACCINE  Completed  . Hepatitis C Screening  Completed    Patient Care Team: Chrismon, Vickki Muff, PA-C as PCP - General (Family Medicine) Telford Nab, RN as Oncology Nurse Navigator  Review of Systems  Constitutional: Negative.   HENT: Negative.   Eyes: Negative.   Respiratory: Negative.   Cardiovascular: Negative.   Gastrointestinal: Negative.   Endocrine: Negative.    Genitourinary: Negative.   Musculoskeletal: Negative.   Skin: Negative.   Allergic/Immunologic: Negative.   Neurological: Negative.   Hematological: Negative.   Psychiatric/Behavioral: Negative.     {Labs  Heme  Chem  Endocrine  Serology  Results Review (optional):[id number]::" "}  Objective    There were no vitals taken for this visit. Physical Exam Constitutional:      Appearance: Normal appearance. He is normal weight.  HENT:     Head: Normocephalic and atraumatic.     Right Ear: Tympanic membrane, ear canal and external ear normal.     Left Ear: Tympanic membrane, ear canal and external ear normal.     Nose: Nose normal.     Mouth/Throat:  Mouth: Mucous membranes are moist.     Pharynx: Oropharynx is clear.  Eyes:     Extraocular Movements: Extraocular movements intact.     Conjunctiva/sclera: Conjunctivae normal.     Pupils: Pupils are equal, round, and reactive to light.  Cardiovascular:     Rate and Rhythm: Normal rate and regular rhythm.     Pulses: Normal pulses.     Heart sounds: Normal heart sounds.  Pulmonary:     Effort: Pulmonary effort is normal.     Breath sounds: Normal breath sounds.  Abdominal:     General: Abdomen is flat. Bowel sounds are normal.     Palpations: Abdomen is soft.  Musculoskeletal:        General: Normal range of motion.     Cervical back: Normal range of motion and neck supple.  Skin:    General: Skin is warm and dry.  Neurological:     General: No focal deficit present.     Mental Status: He is alert and oriented to person, place, and time. Mental status is at baseline.  Psychiatric:        Mood and Affect: Mood normal.        Behavior: Behavior normal.        Thought Content: Thought content normal.        Judgment: Judgment normal.    ***  Depression Screen PHQ 2/9 Scores 02/05/2020  PHQ - 2 Score 0  PHQ- 9 Score 4   No results found for any visits on 07/22/20.  Assessment & Plan     ***  No follow-ups on  file.     {provider attestation***:1}   Vernie Murders, Hershal Coria  [facility] [phone] (phone) [phone] (fax)  [place]

## 2020-07-23 ENCOUNTER — Encounter
Admission: RE | Disposition: A | Discharge status: Home / Self Care | Payer: Self-pay | Source: Home / Self Care | Attending: Gastroenterology

## 2020-07-23 ENCOUNTER — Other Ambulatory Visit: Payer: Self-pay

## 2020-07-23 ENCOUNTER — Ambulatory Visit: Payer: Medicare Other | Admitting: Anesthesiology

## 2020-07-23 ENCOUNTER — Ambulatory Visit
Admission: RE | Admit: 2020-07-23 | Discharge: 2020-07-23 | Disposition: A | Discharge status: Home / Self Care | Payer: Medicare Other | Attending: Gastroenterology | Admitting: Gastroenterology

## 2020-07-23 ENCOUNTER — Encounter: Payer: Self-pay | Admitting: Gastroenterology

## 2020-07-23 DIAGNOSIS — K5730 Diverticulosis of large intestine without perforation or abscess without bleeding: Secondary | ICD-10-CM | POA: Diagnosis not present

## 2020-07-23 DIAGNOSIS — Z8673 Personal history of transient ischemic attack (TIA), and cerebral infarction without residual deficits: Secondary | ICD-10-CM | POA: Insufficient documentation

## 2020-07-23 DIAGNOSIS — Z86711 Personal history of pulmonary embolism: Secondary | ICD-10-CM | POA: Insufficient documentation

## 2020-07-23 DIAGNOSIS — Z79899 Other long term (current) drug therapy: Secondary | ICD-10-CM | POA: Diagnosis not present

## 2020-07-23 DIAGNOSIS — R195 Other fecal abnormalities: Secondary | ICD-10-CM | POA: Insufficient documentation

## 2020-07-23 DIAGNOSIS — D631 Anemia in chronic kidney disease: Secondary | ICD-10-CM | POA: Diagnosis not present

## 2020-07-23 DIAGNOSIS — I130 Hypertensive heart and chronic kidney disease with heart failure and stage 1 through stage 4 chronic kidney disease, or unspecified chronic kidney disease: Secondary | ICD-10-CM | POA: Diagnosis not present

## 2020-07-23 DIAGNOSIS — J432 Centrilobular emphysema: Secondary | ICD-10-CM | POA: Diagnosis not present

## 2020-07-23 DIAGNOSIS — D123 Benign neoplasm of transverse colon: Secondary | ICD-10-CM | POA: Insufficient documentation

## 2020-07-23 DIAGNOSIS — Z955 Presence of coronary angioplasty implant and graft: Secondary | ICD-10-CM | POA: Diagnosis not present

## 2020-07-23 DIAGNOSIS — K635 Polyp of colon: Secondary | ICD-10-CM | POA: Diagnosis not present

## 2020-07-23 DIAGNOSIS — Z811 Family history of alcohol abuse and dependence: Secondary | ICD-10-CM | POA: Insufficient documentation

## 2020-07-23 DIAGNOSIS — Z8249 Family history of ischemic heart disease and other diseases of the circulatory system: Secondary | ICD-10-CM | POA: Insufficient documentation

## 2020-07-23 DIAGNOSIS — K644 Residual hemorrhoidal skin tags: Secondary | ICD-10-CM | POA: Diagnosis not present

## 2020-07-23 DIAGNOSIS — Z7951 Long term (current) use of inhaled steroids: Secondary | ICD-10-CM | POA: Insufficient documentation

## 2020-07-23 DIAGNOSIS — Z7901 Long term (current) use of anticoagulants: Secondary | ICD-10-CM | POA: Insufficient documentation

## 2020-07-23 DIAGNOSIS — N189 Chronic kidney disease, unspecified: Secondary | ICD-10-CM | POA: Diagnosis not present

## 2020-07-23 DIAGNOSIS — Z87891 Personal history of nicotine dependence: Secondary | ICD-10-CM | POA: Diagnosis not present

## 2020-07-23 DIAGNOSIS — K648 Other hemorrhoids: Secondary | ICD-10-CM | POA: Diagnosis not present

## 2020-07-23 DIAGNOSIS — Z833 Family history of diabetes mellitus: Secondary | ICD-10-CM | POA: Insufficient documentation

## 2020-07-23 DIAGNOSIS — K5790 Diverticulosis of intestine, part unspecified, without perforation or abscess without bleeding: Secondary | ICD-10-CM | POA: Diagnosis not present

## 2020-07-23 DIAGNOSIS — Z7982 Long term (current) use of aspirin: Secondary | ICD-10-CM | POA: Diagnosis not present

## 2020-07-23 DIAGNOSIS — D126 Benign neoplasm of colon, unspecified: Secondary | ICD-10-CM | POA: Diagnosis not present

## 2020-07-23 DIAGNOSIS — Z96641 Presence of right artificial hip joint: Secondary | ICD-10-CM | POA: Diagnosis not present

## 2020-07-23 DIAGNOSIS — I5022 Chronic systolic (congestive) heart failure: Secondary | ICD-10-CM | POA: Diagnosis not present

## 2020-07-23 HISTORY — DX: Atherosclerotic heart disease of native coronary artery without angina pectoris: I25.10

## 2020-07-23 HISTORY — PX: COLONOSCOPY WITH PROPOFOL: SHX5780

## 2020-07-23 HISTORY — DX: Cardiac arrhythmia, unspecified: I49.9

## 2020-07-23 SURGERY — COLONOSCOPY WITH PROPOFOL
Anesthesia: General

## 2020-07-23 MED ORDER — LIDOCAINE HCL (PF) 2 % IJ SOLN
INTRAMUSCULAR | Status: AC
  Filled 2020-07-23: qty 5

## 2020-07-23 MED ORDER — PROPOFOL 500 MG/50ML IV EMUL
INTRAVENOUS | Status: AC
  Filled 2020-07-23: qty 50

## 2020-07-23 MED ORDER — EPHEDRINE 5 MG/ML INJ
INTRAVENOUS | Status: AC
  Filled 2020-07-23: qty 10

## 2020-07-23 MED ORDER — PROPOFOL 10 MG/ML IV BOLUS
INTRAVENOUS | Status: DC | PRN
Start: 2020-07-23 — End: 2020-07-23
  Administered 2020-07-23: 90 mg via INTRAVENOUS

## 2020-07-23 MED ORDER — SODIUM CHLORIDE 0.9 % IV SOLN: INTRAVENOUS | Status: DC

## 2020-07-23 MED ORDER — EPHEDRINE SULFATE 50 MG/ML IJ SOLN
INTRAMUSCULAR | Status: DC | PRN
  Administered 2020-07-23 (×2): 10 mg via INTRAVENOUS

## 2020-07-23 MED ORDER — LIDOCAINE HCL (CARDIAC) PF 100 MG/5ML IV SOSY
PREFILLED_SYRINGE | INTRAVENOUS | Status: DC | PRN
  Administered 2020-07-23: 40 mg via INTRAVENOUS

## 2020-07-23 MED ORDER — PROPOFOL 500 MG/50ML IV EMUL
INTRAVENOUS | Status: DC | PRN
  Administered 2020-07-23: 150 ug/kg/min via INTRAVENOUS

## 2020-07-23 NOTE — H&P (Signed)
Cephas Darby, MD [address]  [place]  [place], [place] [address]  Main: [phone]  Fax: [phone] Pager: [phone]  Primary Care Physician:  Margo Common, PA-C Primary Gastroenterologist:  Dr. Cephas Darby  Pre-Procedure History & Physical: HPI:  Richard Wiggins is a 70 y.o. male is here for an colonoscopy.   Past Medical History:  Diagnosis Date  . 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 Anemia 02/09/2020  . Anxiety   . Benign essential HTN 02/10/2020  . BPH (benign prostatic hyperplasia) 02/09/2020  . Brain aneurysm   . Chronic kidney disease   . COPD (chronic obstructive pulmonary disease) ([place])   . Coronary artery disease   . Depression   . Dysrhythmia   . Emphysema of lung ([place])   . History of common carotid artery stent placement   . Hypertension   . IDA (iron deficiency anemia) 02/09/2020  . 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 Marijuana use, continuous 02/10/2020  . PAF (paroxysmal atrial fibrillation) ([place])   . Pulmonary emboli ([place]) 06/07/2017   Right lower lobe pulmonary embolism small segmental, multifocal multifocal pneumonia, mediastinal lymphadenopathy, moderate centrilobular emphysema  . Stroke [facility])    left sided weakness to arm and leg    Past Surgical History:  Procedure Laterality Date  . brain aneurysm with clip    . CORONARY ANGIOPLASTY WITH STENT PLACEMENT  07/08/2017  . hip repleacement Right    Right total hip arthroplasty  . VIDEO BRONCHOSCOPY WITH ENDOBRONCHIAL NAVIGATION N/A 04/23/2020   Procedure: VIDEO BRONCHOSCOPY WITH ENDOBRONCHIAL NAVIGATION;  Surgeon: Tyler Pita, MD;  Location: [facility] ORS;  Service: Pulmonary;  Laterality: N/A;    Prior to Admission medications   Medication Sig Start Date End Date Taking? Authorizing Provider  apixaban (ELIQUIS) 5 MG TABS tablet Take 5 mg by mouth in the morning and at bedtime.     [provider]  aspirin EC 81 MG tablet Take 81 mg by mouth daily. Swallow whole.    [provider]  atorvastatin (LIPITOR) 40 MG tablet TAKE 1 TABLET(40 MG) BY MOUTH AT BEDTIME 05/19/20   Burnard Hawthorne, FNP  budesonide-formoterol (SYMBICORT) 160-4.5 MCG/ACT inhaler Inhale 2 puffs into the lungs 2 (two) times daily.    [provider]  carvedilol (COREG) 25 MG tablet Take 12.5 mg by mouth in the morning and at bedtime.    [provider]  furosemide (LASIX) 40 MG tablet Take 1 tablet (40 mg total) by mouth as needed. 07/07/20 10/05/20  Kate Sable, MD  ipratropium-albuterol (DUONEB) 0.5-2.5 (3) MG/3ML SOLN Inhale 3 mLs into the lungs every 6 (six) hours as needed. Wheezing shortness of breath 09/30/17   [provider]  Lidocaine HCl-Benzyl Alcohol (SALONPAS LIDOCAINE PLUS EX) Place 1 patch onto the skin daily as needed (pain.).    [provider]  sacubitril-valsartan (ENTRESTO) 49-51 MG Take 0.5 tablets by mouth in the morning and at bedtime.    [provider]  spironolactone (ALDACTONE) 25 MG tablet Take 12.5 mg by mouth at bedtime.    [provider]  tamsulosin (FLOMAX) 0.4 MG CAPS capsule TAKE 1 CAPSULE(0.4 MG) BY MOUTH DAILY 06/02/20   [name], NP    Allergies as of 06/23/2020  . (No Known Allergies)    Family History  Problem Relation Age of Onset  . Diabetes Mother   . Heart disease Mother   .  Stroke Father   . Heart disease Father   . Heart attack Father   . Alcohol abuse Father   . Heart disease Sister   . Heart attack Sister   . Heart disease Maternal Grandmother   . Heart attack Maternal Grandmother   . Heart attack Paternal Grandmother   . Diabetes Brother   . Heart disease Brother   . Heart attack Brother     Social History   Socioeconomic History  . Marital status: Married    Spouse name: Not on file  . Number of children: Not on file  . Years of education: Not on  file  . Highest education level: High school graduate  Occupational History  . Occupation: Retired  Tobacco Use  . Smoking status: Former Smoker    Packs/day: 2.00    Years: 53.00    Pack years: 106.00    Types: Cigarettes    Quit date: 05/28/2017    Years since quitting: 3.1  . Smokeless tobacco: Never Used  Vaping Use  . Vaping Use: Former  . Quit date: 01/15/2020  . Devices: cbd   Substance and Sexual Activity  . Alcohol use: Yes    Comment: socially drink cocktail  . Drug use: Yes    Types: Marijuana    Comment: last smoke x1 month ago  . Sexual activity: Not Currently  Other Topics Concern  . Not on file  Social History Narrative   Lives with wife. 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 Children x2 children and grandchildren grown.    Social Determinants of Health   Financial Resource Strain: Not on file  Food Insecurity: Not on file  Transportation Needs: Not on file  Physical Activity: Not on file  Stress: Not on file  Social Connections: Not on file  Intimate Partner Violence: Not on file    Review of Systems: See HPI, otherwise negative ROS  Physical Exam: BP (!) 166/94   Pulse 74   Temp 97.8 F (36.6 C) (Temporal)   Resp 16   Ht 5\' 10"  (1.778 m)   Wt 94.8 kg   SpO2 97%   BMI 29.99 kg/m  General:   Alert,  pleasant and cooperative in NAD Head:  Normocephalic and atraumatic. Neck:  Supple; no masses or thyromegaly. Lungs:  Clear throughout to auscultation.    Heart:  Regular rate and rhythm. Abdomen:  Soft, nontender and nondistended. Normal bowel sounds, without guarding, and without rebound.   Neurologic:  Alert and  oriented x4;  grossly normal neurologically.  Impression/Plan: Richard Wiggins is here for an colonoscopy to be performed for positive cologard  Risks, benefits, limitations, and alternatives regarding  colonoscopy have been reviewed with the patient.  Questions have been answered.  All parties agreeable.   Sherri Sear, MD  07/23/2020, 8:49 AM

## 2020-07-23 NOTE — Op Note (Signed)
[facility] Gastroenterology Patient Name: Richard Wiggins Procedure Date: 07/23/2020 9:15 AM MRN: [id number] Account #: [id number] Date of Birth: 1951-02-10 Admit Type: Outpatient Age: 70 Room: [facility] ENDO ROOM 4 Gender: Male Note Status: Finalized Procedure:             Colonoscopy Indications:           Positive Cologuard test Providers:             Lin Landsman MD, MD Referring MD:          Vickki Muff. Chrismon, MD (Referring MD) Medicines:             General Anesthesia Complications:         No immediate complications. Estimated blood loss: None. Procedure:             Pre-Anesthesia Assessment:                        - Prior to the procedure, a History and Physical was                         performed, and patient medications and allergies were                         reviewed. The patient is competent. The risks and                         benefits of the procedure and the sedation options and                         risks were discussed with the patient. All questions                         were answered and informed consent was obtained.                         Patient identification and proposed procedure were                         verified by the physician, the nurse, the                         anesthesiologist, the anesthetist and the technician                         in the pre-procedure area in the procedure room in the                         endoscopy suite. Mental Status Examination: alert and                         oriented. Airway Examination: normal oropharyngeal                         airway and neck mobility. Respiratory Examination:                         clear to auscultation. CV Examination: normal.  Prophylactic Antibiotics: The patient does not require                         prophylactic antibiotics. Prior Anticoagulants: The                         patient has taken Eliquis (apixaban), last dose was 5                          days prior to procedure. ASA Grade Assessment: III - A                         patient with severe systemic disease. After reviewing                         the risks and benefits, the patient was deemed in                         satisfactory condition to undergo the procedure. The                         anesthesia plan was to use general anesthesia.                         Immediately prior to administration of medications,                         the patient was re-assessed for adequacy to receive                         sedatives. The heart rate, respiratory rate, oxygen                         saturations, blood pressure, adequacy of pulmonary                         ventilation, and response to care were monitored                         throughout the procedure. The physical status of the                         patient was re-assessed after the procedure.                        After obtaining informed consent, the colonoscope was                         passed under direct vision. Throughout the procedure,                         the patient's blood pressure, pulse, and oxygen                         saturations were monitored continuously. The                         Colonoscope was introduced through the anus and  advanced to the the cecum, identified by appendiceal                         orifice and ileocecal valve. The colonoscopy was                         performed without difficulty. The patient tolerated                         the procedure well. The quality of the bowel                         preparation was evaluated using the BBPS [facility] Bowel                         Preparation Scale) with scores of: Right Colon = 3,                         Transverse Colon = 3 and Left Colon = 3 (entire mucosa                         seen well with no residual staining, small fragments                         of stool or opaque  liquid). The total BBPS score                         equals 9. Findings:      The perianal and digital rectal examinations were normal. Pertinent       negatives include normal sphincter tone and no palpable rectal lesions.      Two sessile polyps were found in the transverse colon. The polyps were 3       to 5 mm in size. These polyps were removed with a cold snare. Resection       and retrieval were complete. Estimated blood loss was minimal.      Multiple diverticula were found in the sigmoid colon.      Non-bleeding external and internal hemorrhoids were found during       retroflexion. The hemorrhoids were large. Impression:            - Two 3 to 5 mm polyps in the transverse colon,                         removed with a cold snare. Resected and retrieved.                        - Diverticulosis in the sigmoid colon.                        - Non-bleeding external and internal hemorrhoids. Recommendation:        - Discharge patient to home (with escort).                        - Resume previous diet today.                        - Continue present medications.                        -  Await pathology results.                        - Resume Eliquis (apixaban) at prior dose tomorrow.                         Refer to managing physician for further adjustment of                         therapy.                        - Repeat colonoscopy in 7 years for surveillance based                         on pathology results. Procedure Code(s):     --- Professional ---                        [phone], Colonoscopy, flexible; with removal of                         tumor(s), polyp(s), or other lesion(s) by snare                         technique Diagnosis Code(s):     --- Professional ---                        K63.5, Polyp of colon                        K64.8, Other hemorrhoids                        R19.5, Other fecal abnormalities                        K57.30, Diverticulosis of large  intestine without                         perforation or abscess without bleeding CPT copyright 2019 American Medical Association. All rights reserved. The codes documented in this report are preliminary and upon coder review may  be revised to meet current compliance requirements. Dr. Ulyess Mort Lin Landsman MD, MD 07/23/2020 9:39:43 AM This report has been signed electronically. Number of Addenda: 0 Note Initiated On: 07/23/2020 9:15 AM Scope Withdrawal Time: 0 hours 8 minutes 54 seconds  Total Procedure Duration: 0 hours 13 minutes 31 seconds  Estimated Blood Loss:  Estimated blood loss: none.      [facility]

## 2020-07-23 NOTE — Transfer of Care (Signed)
Immediate Anesthesia Transfer of Care Note  Patient: Richard Wiggins  Procedure(s) Performed: Procedure(s): COLONOSCOPY WITH PROPOFOL (N/A)  Patient Location: PACU and Endoscopy Unit  Anesthesia Type:General  Level of Consciousness: sedated  Airway & Oxygen Therapy: Patient Spontanous Breathing and Patient connected to nasal cannula oxygen  Post-op Assessment: Report given to RN and Post -op Vital signs reviewed and stable  Post vital signs: Reviewed and stable  Last Vitals:  Vitals:   07/23/20 0940 07/23/20 0941  BP: 93/67 93/67  Pulse:  72  Resp:  19  Temp:    SpO2:  62%    Complications: No apparent anesthesia complications

## 2020-07-23 NOTE — Anesthesia Procedure Notes (Signed)
Date/Time: 07/23/2020 9:17 AM Performed by: Doreen Salvage, CRNA Pre-anesthesia Checklist: Patient identified, Emergency Drugs available, Suction available and Patient being monitored Patient Re-evaluated:Patient Re-evaluated prior to induction Oxygen Delivery Method: Nasal cannula Induction Type: IV induction Dental Injury: Teeth and Oropharynx as per pre-operative assessment  Comments: Nasal cannula with etCO2 monitoring

## 2020-07-23 NOTE — Anesthesia Postprocedure Evaluation (Signed)
Anesthesia Post Note  Patient: Richard Wiggins  Procedure(s) Performed: COLONOSCOPY WITH PROPOFOL (N/A )  Patient location during evaluation: Endoscopy Anesthesia Type: General Level of consciousness: awake and alert Pain management: pain level controlled Vital Signs Assessment: post-procedure vital signs reviewed and stable Respiratory status: spontaneous breathing, nonlabored ventilation, respiratory function stable and patient connected to nasal cannula oxygen Cardiovascular status: blood pressure returned to baseline and stable Postop Assessment: no apparent nausea or vomiting Anesthetic complications: no   No complications documented.   Last Vitals:  Vitals:   07/23/20 0941 07/23/20 0950  BP: 133/78 133/78  Pulse: 72 65  Resp: 19 16  Temp:    SpO2: 97% 96%    Last Pain:  Vitals:   07/23/20 0827  TempSrc: Temporal  PainSc: 0-No pain                 Precious Haws Terrianna [place]cl[place]

## 2020-07-23 NOTE — Anesthesia Preprocedure Evaluation (Signed)
Anesthesia Evaluation  Patient identified by MRN, date of birth, ID band Patient awake    Reviewed: Allergy & Precautions, H&P , NPO status , Patient's Chart, lab work & pertinent test results  History of Anesthesia Complications Negative for: history of anesthetic complications  Airway Mallampati: III  TM Distance: <3 FB Neck ROM: limited    Dental  (+) Upper Dentures, Lower Dentures   Pulmonary COPD, former smoker,    Pulmonary exam normal        Cardiovascular Exercise Tolerance: Good hypertension, (-) angina+ CAD, + Peripheral Vascular Disease, +CHF and + DOE  Normal cardiovascular exam+ dysrhythmias      Neuro/Psych PSYCHIATRIC DISORDERS CVA, Residual Symptoms    GI/Hepatic Neg liver ROS, GERD  Medicated and Controlled,  Endo/Other  negative endocrine ROS  Renal/GU Renal disease  negative genitourinary   Musculoskeletal   Abdominal   Peds  Hematology negative hematology ROS (+)   Anesthesia Other Findings Past Medical History: 2020: AAA (abdominal aortic aneurysm) ([facility])     Comment:  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 -plan medical managmment               with tight bp control 02/09/2020: Anemia No date: Anxiety 02/10/2020: Benign essential HTN 02/09/2020: BPH (benign prostatic hyperplasia) No date: Brain aneurysm No date: Chronic kidney disease No date: COPD (chronic obstructive pulmonary disease) ([facility]) No date: Coronary artery disease No date: Depression No date: Dysrhythmia No date: Emphysema of lung ([facility]) No date: History of common carotid artery stent placement No date: Hypertension 02/09/2020: IDA (iron deficiency anemia) 02/10/2020: 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: Marijuana use, continuous No date: PAF (paroxysmal atrial fibrillation) ([place]) 06/07/2017: Pulmonary emboli ([facility])     Comment:  Right lower lobe pulmonary embolism small segmental,               multifocal multifocal pneumonia,  mediastinal               lymphadenopathy, moderate centrilobular emphysema No date: Stroke [facility])     Comment:  left sided weakness to arm and leg  Past Surgical History: No date: brain aneurysm with clip 07/08/2017: CORONARY ANGIOPLASTY WITH STENT PLACEMENT No date: hip repleacement; Right     Comment:  Right total hip arthroplasty 04/23/2020: [place]; N/A     Comment:  Procedure: VIDEO BRONCHOSCOPY WITH ENDOBRONCHIAL               NAVIGATION;  Surgeon: Tyler Pita, MD;  Location:               [facility] ORS;  Service: Pulmonary;  Laterality: N/A;  BMI    Body Mass Index: 29.99 kg/m      Reproductive/Obstetrics negative OB ROS                             Anesthesia Physical  Anesthesia Plan  ASA: III  Anesthesia Plan: General   Post-op Pain Management:    Induction: Intravenous  PONV Risk Score and Plan: 2 and Ondansetron, Dexamethasone and Treatment may vary due to age or medical condition  Airway Management Planned: Oral ETT  Additional Equipment:   Intra-op Plan:   Post-operative Plan:   Informed Consent: I have reviewed the patients History and Physical, chart, labs and discussed the procedure including the risks, benefits and alternatives for the proposed anesthesia with the patient or authorized representative who has indicated his/her understanding and acceptance.  Dental Advisory Given  Plan Discussed with: Anesthesiologist, CRNA and Surgeon  Anesthesia Plan Comments: (Patient consented for risks of anesthesia including but not limited to:  - adverse reactions to medications - risk of airway placement if required - damage to eyes, teeth, lips or other oral mucosa - nerve damage due to positioning  - sore throat or hoarseness - Damage to heart, brain, nerves, lungs, other parts of body or loss of life  Patient voiced understanding.)        Anesthesia Quick Evaluation

## 2020-07-24 ENCOUNTER — Encounter: Payer: Self-pay | Admitting: Gastroenterology

## 2020-07-24 LAB — SURGICAL PATHOLOGY

## 2020-07-29 ENCOUNTER — Encounter: Payer: Self-pay | Admitting: Family Medicine

## 2020-07-29 ENCOUNTER — Other Ambulatory Visit: Payer: Self-pay

## 2020-07-29 ENCOUNTER — Telehealth (INDEPENDENT_AMBULATORY_CARE_PROVIDER_SITE_OTHER): Payer: Medicare Other | Admitting: Family Medicine

## 2020-07-29 DIAGNOSIS — I714 Abdominal aortic aneurysm, without rupture, unspecified: Secondary | ICD-10-CM

## 2020-07-29 DIAGNOSIS — I639 Cerebral infarction, unspecified: Secondary | ICD-10-CM | POA: Diagnosis not present

## 2020-07-29 DIAGNOSIS — I480 Paroxysmal atrial fibrillation: Secondary | ICD-10-CM

## 2020-07-29 DIAGNOSIS — Z7689 Persons encountering health services in other specified circumstances: Secondary | ICD-10-CM

## 2020-07-29 DIAGNOSIS — I5022 Chronic systolic (congestive) heart failure: Secondary | ICD-10-CM

## 2020-07-29 DIAGNOSIS — I6529 Occlusion and stenosis of unspecified carotid artery: Secondary | ICD-10-CM

## 2020-07-29 MED ORDER — APIXABAN 5 MG PO TABS
5.0000 mg | ORAL_TABLET | Freq: Two times a day (BID) | ORAL | 1 refills | Status: DC
Start: 2020-07-29 — End: 2020-11-19

## 2020-07-29 NOTE — Progress Notes (Signed)
New patient virtual                                                            videovisit-Mychart  Virtual Visit via Video Note  I connected with Richard Wiggins on 07-29-20 at 11:20 AM EST by a video enabled telemedicine application and verified that I am speaking with the correct person using two identifiers.  Location: Patient: Home Provider: Home   I discussed the limitations of evaluation and management by telemedicine and the availability of in person appointments. The patient expressed understanding and agreed to proceed.   Patient: Richard Wiggins   DOB: [birth year]   70 y.o. Male  MRN: [id number] Visit Date: 07/29/2020  Today's healthcare provider: Vernie Murders, PA-C   No chief complaint on file.  Subjective    Richard Wiggins is a 70 y.o. male who presents today as a new patient to establish care. Moved here from [place] in June 2021. He has a PMH of hypertension, CVA 2018, PE provoked at time of hospitalization for CVA, brain aneurysm status post clipping, CHF, paroxysmal atrial fib on Eliquis, CAD status post PCI to RCA 2018, carotid stenosis status post CEA 2018, hyperlipidemia, BPH, COPD, anemia, OSA, depression, previous cigarette smoker with 53 pack year history now vaping CBD marijuana, pulmonary nodule with malignancy requiring 5 rounds of radiation, seizure disorder with mild changes on EEG, AAA monitored by vascular surgeon and atrial fibrillation.  CAD/atrial fib: stable Eliquis 5 mg twice daily, EC aspirin 81 mg daily, Coreg 25 mg take 1/2 tablet twice daily, Entresto 49-51 mg 1/2 tablet twice daily, spironolactone 25 mg half at bedtime,  furosemide 20 mg as needed couple times a week for chronic mild ankle edema.He is followed by Dr. Garen Lah in Cardiology.  Echocardiogram 02/01/2020 EF 60 to 65% with normal diastolic function.  PAD/ RT carotid artery stenosis status post internal artery stent and left carotid artery stenosis/AAA  4.3 cm: Followed by Dr.  Lucky Cowboy in [place] vein and vascular.  This is below the threshold for prophylactic repair but increased surveillance is necessary over 4 cm.  Plan to see him back in 6 months with duplex we will continue to follow this with duplex.  Threshold for repair is around 5 cm.  COPD/lung nodule/emphysema: Presents on Incruse Ellipta 1 puff daily, albuterol inhaler as needed, Symbicort 160-4.5 mics per ACT inhaler 2 puffs twice daily. Sleep apnea testing in the past.  He is followed by Dr. Patsey Berthold in [place].   Past Medical History:  Diagnosis Date  . AAA (abdominal aortic aneurysm) ([place]) 2020   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 -plan medical managmment with tight bp control  . Anemia 02/09/2020  . Anxiety   . Benign essential HTN 02/10/2020  . BPH (benign prostatic hyperplasia) 02/09/2020  . Brain aneurysm   . Cancer ([place])   . CHF (congestive heart failure) ([place])   . Chronic kidney disease   . COPD (chronic obstructive pulmonary disease) ([place])   . Coronary artery disease   . Depression   . Dysrhythmia   . Emphysema of lung ([place])   . History of common carotid artery stent placement   . Hypertension   . IDA (iron deficiency anemia) 02/09/2020  . 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 Marijuana use, continuous 02/10/2020  .  Osteoporosis   . PAF (paroxysmal atrial fibrillation) ([place])   . Pulmonary emboli ([place]) 06/07/2017   Right lower lobe pulmonary embolism small segmental, multifocal multifocal pneumonia, mediastinal lymphadenopathy, moderate centrilobular emphysema  . Seizures ([place])   . Stroke [facility])    left sided weakness to arm and leg   Past Surgical History:  Procedure Laterality Date  . brain aneurysm with clip    . COLONOSCOPY WITH PROPOFOL N/A 07/23/2020   Procedure: COLONOSCOPY WITH PROPOFOL;  Surgeon: Lin Landsman, MD;  Location: [facility] ENDOSCOPY;  Service: Gastroenterology;  Laterality: N/A;  . CORONARY ANGIOPLASTY WITH STENT PLACEMENT  07/08/2017  . hip repleacement Right    Right  total hip arthroplasty  . VIDEO BRONCHOSCOPY WITH ENDOBRONCHIAL NAVIGATION N/A 04/23/2020   Procedure: VIDEO BRONCHOSCOPY WITH ENDOBRONCHIAL NAVIGATION;  Surgeon: Tyler Pita, MD;  Location: [facility] ORS;  Service: Pulmonary;  Laterality: N/A;   Family Status  Relation Name Status  . Mother  Deceased  . Father  Deceased  . Sister Butch Penny Deceased  . Brother [employer]  . Daughter  Alive  . Son  Alive  . MGM  Deceased  . MGF  Deceased  . PGM  Deceased  . PGF  Deceased  . Sister [employer]  . Brother Sonia Side Deceased   Family History  Problem Relation Age of Onset  . Diabetes Mother   . Heart disease Mother   . Stroke Father   . Heart disease Father   . Heart attack Father   . Alcohol abuse Father   . Heart disease Sister   . Heart attack Sister   . Heart disease Maternal Grandmother   . Heart attack Maternal Grandmother   . Heart attack Paternal Grandmother   . Diabetes Brother   . Heart disease Brother   . Heart attack Brother    Social History   Socioeconomic History  . Marital status: Married    Spouse name: Not on file  . Number of children: Not on file  . Years of education: Not on file  . Highest education level: High school graduate  Occupational History  . Occupation: Retired  Tobacco Use  . Smoking status: Former Smoker    Packs/day: 2.00    Years: 53.00    Pack years: 106.00    Types: Cigarettes    Quit date: 05/28/2017    Years since quitting: 3.1  . Smokeless tobacco: Never Used  Vaping Use  . Vaping Use: Former  . Quit date: 01/15/2020  . Devices: cbd   Substance and Sexual Activity  . Alcohol use: Yes    Comment: socially drink cocktail  . Drug use: Yes    Types: Marijuana    Comment: last smoke x1 month ago  . Sexual activity: Not Currently  Other Topics Concern  . Not on file  Social History Narrative   Lives with wife. 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 Children x2 children and grandchildren grown.    Social  Determinants of Health   Financial Resource Strain: Not on file  Food Insecurity: Not on file  Transportation Needs: Not on file  Physical Activity: Not on file  Stress: Not on file  Social Connections: Not on file   Outpatient Medications Prior to Visit  Medication Sig  . apixaban (ELIQUIS) 5 MG TABS tablet Take 5 mg by mouth in the morning and at bedtime.  [name] aspirin EC 81 MG tablet Take 81 mg by mouth daily. Swallow whole.  [name] atorvastatin (LIPITOR)  40 MG tablet TAKE 1 TABLET(40 MG) BY MOUTH AT BEDTIME  . budesonide-formoterol (SYMBICORT) 160-4.5 MCG/ACT inhaler Inhale 2 puffs into the lungs 2 (two) times daily.  . carvedilol (COREG) 25 MG tablet Take 12.5 mg by mouth in the morning and at bedtime.  . furosemide (LASIX) 40 MG tablet Take 1 tablet (40 mg total) by mouth as needed.  [name] ipratropium-albuterol (DUONEB) 0.5-2.5 (3) MG/3ML SOLN Inhale 3 mLs into the lungs every 6 (six) hours as needed. Wheezing shortness of breath  . lamoTRIgine (LAMICTAL) 25 MG tablet Take 25 mg by mouth daily. 1 q hs x 2 weeks then BID  . Lidocaine HCl-Benzyl Alcohol (SALONPAS LIDOCAINE PLUS EX) Place 1 patch onto the skin daily as needed (pain.).  [name] sacubitril-valsartan (ENTRESTO) 49-51 MG Take 0.5 tablets by mouth in the morning and at bedtime.  [name] spironolactone (ALDACTONE) 25 MG tablet Take 12.5 mg by mouth at bedtime.  . tamsulosin (FLOMAX) 0.4 MG CAPS capsule TAKE 1 CAPSULE(0.4 MG) BY MOUTH DAILY   No facility-administered medications prior to visit.   No Known Allergies  Immunization History  Administered Date(s) Administered  . Fluad Quad(high Dose 65+) 03/26/2020  . Influenza,inj,Quad PF,6+ Mos 06/02/2017  . Influenza-Unspecified 04/23/2019  . Moderna Sars-Covid-2 Vaccination 09/12/2019, 10/10/2019  . Pneumococcal Polysaccharide-23 06/02/2017  . Tdap 02/09/2018  . Zoster Recombinat (Shingrix) 05/21/2019, 10/19/2019    Health Maintenance  Topic Date Due  . PNA vac Low Risk Adult (2 of 2 -  PCV13) 06/02/2018  . COVID-19 Vaccine (4 - Booster for Moderna series) 10/19/2020  . COLONOSCOPY (Pts 45-23yrs Insurance coverage will need to be confirmed)  07/23/2025  . TETANUS/TDAP  02/10/2028  . INFLUENZA VACCINE  Completed  . Hepatitis C Screening  Completed    Patient Care Team: Dorsey Charette, Vickki Muff, PA-C as PCP - General (Family Medicine) Telford Nab, RN as Oncology Nurse Navigator  Review of Systems  Constitutional: Negative.   HENT: Positive for dental problem.   Eyes: Negative.   Respiratory: Negative.   Cardiovascular: Negative.   Gastrointestinal: Positive for blood in stool and diarrhea.  Endocrine: Negative.   Genitourinary: Negative.   Musculoskeletal: Positive for back pain and gait problem.  Skin: Negative.   Allergic/Immunologic: Negative.   Neurological: Positive for facial asymmetry and numbness (left hand).  Hematological: Bruises/bleeds easily.  Psychiatric/Behavioral: Negative.       Objective    There were no vitals taken for this visit. WDWN male in no apparent distress.  Head: Normocephalic, atraumatic. Neck: Supple, NROM Respiratory: No apparent distress Psych: Normal mood and affect Neuro: Moving all extremities well, oriented x 3 and only mild weakness remains in the left side of the face.   Depression Screen PHQ 2/9 Scores 07/29/2020 02/05/2020  PHQ - 2 Score 0 0  PHQ- 9 Score 1 4   No results found for any visits on 07/29/20.  Assessment & Plan     1. Encounter to establish care with new doctor General health stable as documented above. History of recent colonoscopy (last week) for evaluation of blood in stools negative for malignancy. History of pulmonary malignancy treated with radiation and followed by Dr. Duwayne Heck (pulmonologist).  2. Cerebrovascular accident (CVA), unspecified mechanism ([place]) Followed by Dr. Manuella Ghazi (neurologist) due to right MCA stroke and seizures. History of cerebral aneurysm repair. Tolerating Lamictal with good  control of seizure-like activity.  - apixaban (ELIQUIS) 5 MG TABS tablet; Take 1 tablet (5 mg total) by mouth in the morning and at bedtime.  Dispense:  90 tablet; Refill: 1  3. Chronic systolic CHF (congestive heart failure) (HCC) Tolerating the Furosemide, Entresto and Coreg. Continue follow up with cardiologist (Dr.Agbor-Etang).  4. Paroxysmal atrial fibrillation (HCC) Regular heart rate. Presently on Eliquis and ASA antiplatelet therapy. Followed by Dr. Garen Lah (cardiologist) - apixaban (ELIQUIS) 5 MG TABS tablet; Take 1 tablet (5 mg total) by mouth in the morning and at bedtime.  Dispense: 90 tablet; Refill: 1  5. Abdominal aortic aneurysm (AAA) without rupture ([place]) Followed by Dr. Lucky Cowboy and scheduled for ultrasound in March 2022.  6. Stenosis of carotid artery, unspecified laterality Needs refill of Eliquis. Followed by Dr. Lucky Cowboy (vascular surgeon). - apixaban (ELIQUIS) 5 MG TABS tablet; Take 1 tablet (5 mg total) by mouth in the morning and at bedtime.  Dispense: 90 tablet; Refill: 1   No follow-ups on file.     I, Macarthur Lorusso, PA-C, have reviewed all documentation for this visit. The documentation on 07/29/20 for the exam, diagnosis, procedures, and orders are all accurate and complete.     Vernie Murders, PA-C  [employer] [phone] (phone) [phone] (fax)  [place]

## 2020-07-30 ENCOUNTER — Encounter: Payer: Self-pay | Admitting: Radiation Oncology

## 2020-07-30 ENCOUNTER — Ambulatory Visit
Admission: RE | Admit: 2020-07-30 | Discharge: 2020-07-30 | Disposition: A | Discharge status: Home / Self Care | Payer: Medicare Other | Source: Ambulatory Visit | Attending: Radiation Oncology | Admitting: Radiation Oncology

## 2020-07-30 ENCOUNTER — Encounter: Payer: Self-pay | Admitting: Family Medicine

## 2020-07-30 VITALS — BP 136/89 | HR 68 | Temp 97.0°F | Resp 18 | Wt 218.1 lb

## 2020-07-30 DIAGNOSIS — C3490 Malignant neoplasm of unspecified part of unspecified bronchus or lung: Secondary | ICD-10-CM

## 2020-07-30 DIAGNOSIS — Z923 Personal history of irradiation: Secondary | ICD-10-CM | POA: Diagnosis not present

## 2020-07-30 DIAGNOSIS — C3412 Malignant neoplasm of upper lobe, left bronchus or lung: Secondary | ICD-10-CM | POA: Insufficient documentation

## 2020-07-30 NOTE — Progress Notes (Signed)
Radiation Oncology Follow up Note  Name: Richard Wiggins   Date:   07/30/2020 MRN:  [id number] DOB: Oct 16, 1950    This 70 y.o. male presents to the clinic today for 1 month follow-up status post SBRT to his left upper lobe for presumed stage I non-small cell lung cancer.  REFERRING PROVIDER: [name], NP  HPI: Patient is a 70 year old male now out 1 month having completed SBRT to his left upper lobe for presumed stage I non-small cell lung cancer.  Seen today in routine follow-up he is doing well has low side effect profile.  Specifically denies cough fatigue dysphagia or chest tightness..  COMPLICATIONS OF TREATMENT: none  FOLLOW UP COMPLIANCE: keeps appointments   PHYSICAL EXAM:  BP 136/89 (BP Location: Left Arm, Patient Position: Sitting)   Pulse 68   Temp (!) 97 F (36.1 C) (Tympanic)   Resp 18   Wt 218 lb 1.6 oz (98.9 kg)   BMI 31.29 kg/m  Well-developed well-nourished patient in NAD. HEENT reveals PERLA, EOMI, discs not visualized.  Oral cavity is clear. No oral mucosal lesions are identified. Neck is clear without evidence of cervical or supraclavicular adenopathy. Lungs are clear to A&P. Cardiac examination is essentially unremarkable with regular rate and rhythm without murmur rub or thrill. Abdomen is benign with no organomegaly or masses noted. Motor sensory and DTR levels are equal and symmetric in the upper and lower extremities. Cranial nerves II through XII are grossly intact. Proprioception is intact. No peripheral adenopathy or edema is identified. No motor or sensory levels are noted. Crude visual fields are within normal range.  RADIOLOGY RESULTS: No current films for review  PLAN: Present time he is tolerated SBRT extremely well with very low side effect profile.  I am pleased with his overall progress.  He already has a CT scan scheduled in March I will see him in follow-up shortly thereafter.  Patient knows to call with any concerns.  I would like to  take this opportunity to thank you for allowing me to participate in the care of your patient.Noreene Filbert, MD

## 2020-08-13 ENCOUNTER — Other Ambulatory Visit: Payer: Self-pay | Admitting: Family

## 2020-08-23 ENCOUNTER — Other Ambulatory Visit: Payer: Self-pay | Admitting: Family Medicine

## 2020-08-26 ENCOUNTER — Telehealth: Payer: Self-pay | Admitting: Family Medicine

## 2020-08-26 NOTE — Telephone Encounter (Signed)
Copied from [place] [phone]. Topic: Medicare AWV >> Aug 26, 2020 11:40 AM Cher Nakai R wrote: Reason for CRM: Left message for patient to call back and schedule Medicare Annual Wellness Visit (AWV) in office.   If not able to come in the office, please offer to do virtually or by telephone.   No hx of AWV - AWV-I eligible as of 10/10/2009  Please schedule at anytime with [facility] Health Advisor.   40 minute appointment  Any questions, please contact me at [phone]

## 2020-09-12 ENCOUNTER — Other Ambulatory Visit: Payer: Self-pay | Admitting: Family Medicine

## 2020-09-12 ENCOUNTER — Other Ambulatory Visit: Payer: Self-pay

## 2020-09-12 ENCOUNTER — Ambulatory Visit (INDEPENDENT_AMBULATORY_CARE_PROVIDER_SITE_OTHER): Payer: Medicare Other

## 2020-09-12 ENCOUNTER — Ambulatory Visit (INDEPENDENT_AMBULATORY_CARE_PROVIDER_SITE_OTHER): Payer: Medicare Other | Admitting: Vascular Surgery

## 2020-09-12 ENCOUNTER — Encounter (INDEPENDENT_AMBULATORY_CARE_PROVIDER_SITE_OTHER): Payer: Self-pay | Admitting: Vascular Surgery

## 2020-09-12 VITALS — BP 161/88 | HR 69 | Resp 16 | Wt 211.6 lb

## 2020-09-12 DIAGNOSIS — I739 Peripheral vascular disease, unspecified: Secondary | ICD-10-CM

## 2020-09-12 DIAGNOSIS — E785 Hyperlipidemia, unspecified: Secondary | ICD-10-CM | POA: Diagnosis not present

## 2020-09-12 DIAGNOSIS — I70213 Atherosclerosis of native arteries of extremities with intermittent claudication, bilateral legs: Secondary | ICD-10-CM

## 2020-09-12 DIAGNOSIS — I714 Abdominal aortic aneurysm, without rupture, unspecified: Secondary | ICD-10-CM

## 2020-09-12 DIAGNOSIS — I10 Essential (primary) hypertension: Secondary | ICD-10-CM | POA: Diagnosis not present

## 2020-09-12 DIAGNOSIS — R7303 Prediabetes: Secondary | ICD-10-CM | POA: Diagnosis not present

## 2020-09-12 MED ORDER — TAMSULOSIN HCL 0.4 MG PO CAPS: 0.4000 mg | ORAL_CAPSULE | Freq: Every day | ORAL | 0 refills | Status: DC

## 2020-09-12 NOTE — Progress Notes (Signed)
MRN : [id number]  Richard Wiggins is a 70 y.o. (1950/12/02) male who presents with chief complaint of  Chief Complaint  Patient presents with  . Follow-up    6 month ultrasound follow up  .  History of Present Illness: Patient returns today in follow up.  He continues to have some trouble with low back pain and some pain and tiredness in his legs.  No open wounds or infection.  No ischemic rest pain.  Noninvasive studies today show ABIs of 0.91 on the right and 0.99 on the left with biphasic waveforms although the right digital pressure was slightly reduced. He is also following up for his aneurysm.  He has no current aneurysm related symptoms although he does get intermittent abdominal pain and has chronic low back pain.  Nothing new or worrisome.  No signs of peripheral embolization. Duplex today shows a stable infrarenal abdominal aortic aneurysm measuring 4.11 cm in maximal diameter.  Both common iliac arteries measure about 2 cm in maximal diameter.  Current Outpatient Medications  Medication Sig Dispense Refill  . apixaban (ELIQUIS) 5 MG TABS tablet Take 1 tablet (5 mg total) by mouth in the morning and at bedtime. 90 tablet 1  . aspirin EC 81 MG tablet Take 81 mg by mouth daily. Swallow whole.    [name] atorvastatin (LIPITOR) 40 MG tablet TAKE 1 TABLET(40 MG) BY MOUTH AT BEDTIME 90 tablet 0  . budesonide-formoterol (SYMBICORT) 160-4.5 MCG/ACT inhaler Inhale 2 puffs into the lungs 2 (two) times daily.    . carvedilol (COREG) 25 MG tablet Take 12.5 mg by mouth in the morning and at bedtime.    . furosemide (LASIX) 40 MG tablet Take 1 tablet (40 mg total) by mouth as needed. 30 tablet 3  . ipratropium-albuterol (DUONEB) 0.5-2.5 (3) MG/3ML SOLN Inhale 3 mLs into the lungs every 6 (six) hours as needed. Wheezing shortness of breath    . lamoTRIgine (LAMICTAL) 25 MG tablet Take 25 mg by mouth daily. 1 q hs x 2 weeks then BID    . Lidocaine HCl-Benzyl Alcohol (SALONPAS LIDOCAINE PLUS EX) Place  1 patch onto the skin daily as needed (pain.).    [name] sacubitril-valsartan (ENTRESTO) 49-51 MG Take 0.5 tablets by mouth in the morning and at bedtime.    [name] spironolactone (ALDACTONE) 25 MG tablet Take 12.5 mg by mouth at bedtime.    . tamsulosin (FLOMAX) 0.4 MG CAPS capsule TAKE 1 CAPSULE(0.4 MG) BY MOUTH DAILY 90 capsule 0   No current facility-administered medications for this visit.    Past Medical History:  Diagnosis Date  . AAA (abdominal aortic aneurysm) ([place]) 2020   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 -plan medical managmment with tight bp control  . Anemia 02/09/2020  . Anxiety   . Benign essential HTN 02/10/2020  . BPH (benign prostatic hyperplasia) 02/09/2020  . Brain aneurysm   . Cancer ([place])   . CHF (congestive heart failure) ([place])   . Chronic kidney disease   . COPD (chronic obstructive pulmonary disease) ([place])   . Coronary artery disease   . Depression   . Dysrhythmia   . Emphysema of lung ([place])   . History of common carotid artery stent placement   . Hypertension   . IDA (iron deficiency anemia) 02/09/2020  . 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 Marijuana use, continuous 02/10/2020  . Osteoporosis   . PAF (paroxysmal atrial fibrillation) ([place])   . Pulmonary emboli ([place]) 06/07/2017   Right lower lobe pulmonary embolism  small segmental, multifocal multifocal pneumonia, mediastinal lymphadenopathy, moderate centrilobular emphysema  . Seizures ([place])   . Stroke [facility])    left sided weakness to arm and leg    Past Surgical History:  Procedure Laterality Date  . brain aneurysm with clip    . COLONOSCOPY WITH PROPOFOL N/A 07/23/2020   Procedure: COLONOSCOPY WITH PROPOFOL;  Surgeon: Lin Landsman, MD;  Location: [facility] ENDOSCOPY;  Service: Gastroenterology;  Laterality: N/A;  . CORONARY ANGIOPLASTY WITH STENT PLACEMENT  07/08/2017  . hip repleacement Right    Right total hip arthroplasty  . VIDEO BRONCHOSCOPY WITH ENDOBRONCHIAL NAVIGATION N/A 04/23/2020   Procedure:  VIDEO BRONCHOSCOPY WITH ENDOBRONCHIAL NAVIGATION;  Surgeon: Tyler Pita, MD;  Location: [facility] ORS;  Service: Pulmonary;  Laterality: N/A;     Social History   Tobacco Use  . Smoking status: Former Smoker    Packs/day: 2.00    Years: 53.00    Pack years: 106.00    Types: Cigarettes    Quit date: 05/28/2017    Years since quitting: 3.2  . Smokeless tobacco: Never Used  Vaping Use  . Vaping Use: Former  . Quit date: 01/15/2020  . Devices: cbd   Substance Use Topics  . Alcohol use: Yes    Comment: socially drink cocktail  . Drug use: Yes    Types: Marijuana    Comment: last smoke x1 month ago      Family History  Problem Relation Age of Onset  . Diabetes Mother   . Heart disease Mother   . Stroke Father   . Heart disease Father   . Heart attack Father   . Alcohol abuse Father   . Heart disease Sister   . Heart attack Sister   . Heart disease Maternal Grandmother   . Heart attack Maternal Grandmother   . Heart attack Paternal Grandmother   . Diabetes Brother   . Heart disease Brother   . Heart attack Brother      No Known Allergies  REVIEW OF SYSTEMS (Negative unless checked)  Constitutional: [] ?Weight loss  [] ?Fever  [] ?Chills Cardiac: [] ?Chest pain   [] ?Chest pressure   [] ?Palpitations   [] ?Shortness of breath when laying flat   [] ?Shortness of breath at rest   [] ?Shortness of breath with exertion. Vascular:  [x] ?Pain in legs with walking   [] ?Pain in legs at rest   [] ?Pain in legs when laying flat   [x] ?Claudication   [] ?Pain in feet when walking  [] ?Pain in feet at rest  [] ?Pain in feet when laying flat   [] ?History of DVT   [] ?Phlebitis   [] ?Swelling in legs   [] ?Varicose veins   [] ?Non-healing ulcers Pulmonary:   [] ?Uses home oxygen   [] ?Productive cough   [] ?Hemoptysis   [] ?Wheeze  [] ?COPD   [] ?Asthma Neurologic:  [] ?Dizziness  [] ?Blackouts   [] ?Seizures   [] ?History of stroke   [] ?History of TIA  [] ?Aphasia   [] ?Temporary blindness   [] ?Dysphagia    [] ?Weakness or numbness in arms   [] ?Weakness or numbness in legs Musculoskeletal:  [x] ?Arthritis   [] ?Joint swelling   [x] ?Joint pain   [x] ?Low back pain Hematologic:  [] ?Easy bruising  [] ?Easy bleeding   [] ?Hypercoagulable state   [] ?Anemic  [] ?Hepatitis Gastrointestinal:  [] ?Blood in stool   [] ?Vomiting blood  [] ?Gastroesophageal reflux/heartburn   [] ?Abdominal pain Genitourinary:  [] ?Chronic kidney disease   [] ?Difficult urination  [] ?Frequent urination  [] ?Burning with urination   [] ?Hematuria Skin:  [] ?Rashes   [] ?Ulcers   [] ?Wounds Psychological:  [] ?History of  anxiety   [] ? History of major depression.  Physical Examination  BP (!) 161/88 (BP Location: Right Arm)   Pulse 69   Resp 16   Wt 211 lb 9.6 oz (96 kg)   BMI 30.36 kg/m  Gen:  WD/WN, NAD Head: [place]/AT, No temporalis wasting. Ear/Nose/Throat: Hearing grossly intact, nares w/o erythema or drainage Eyes: Conjunctiva clear. Sclera non-icteric Neck: Supple.  Trachea midline Pulmonary:  Good air movement, no use of accessory muscles.  Cardiac: RRR, no JVD Vascular:  Vessel Right Left  Radial Palpable Palpable                          PT Palpable Palpable  DP Palpable Palpable   Gastrointestinal: soft, non-tender/non-distended. No guarding/reflex.  Musculoskeletal: M/S 5/5 throughout.  No deformity or atrophy. No edema. Neurologic: Sensation grossly intact in extremities.  Symmetrical.  Speech is fluent.  Psychiatric: Judgment intact, Mood & affect appropriate for pt's clinical situation. Dermatologic: No rashes or ulcers noted.  No cellulitis or open wounds.       Labs Recent Results (from the past 2160 hour(s))  Basic metabolic panel     Status: Abnormal   Collection Time: 07/01/20 10:27 AM  Result Value Ref Range   Sodium 141 135 - 145 mmol/L   Potassium 4.0 3.5 - 5.1 mmol/L   Chloride 104 98 - 111 mmol/L   CO2 29 22 - 32 mmol/L   Glucose, Bld 66 (L) 70 - 99 mg/dL    Comment: Glucose reference range  applies only to samples taken after fasting for at least 8 hours.   BUN 28 (H) 8 - 23 mg/dL   Creatinine, Ser 1.45 (H) 0.61 - 1.24 mg/dL   Calcium 9.5 8.9 - 10.3 mg/dL   GFR, Estimated 52 (L) >60 mL/min    Comment: (NOTE) Calculated using the CKD-EPI Creatinine Equation (2021)    Anion gap 8 5 - 15    Comment: Performed at [facility], [place], [place] [address]  SARS CORONAVIRUS 2 (TAT 6-24 HRS) Nasopharyngeal Nasopharyngeal Swab     Status: None   Collection Time: 07/21/20 10:01 AM   Specimen: Nasopharyngeal Swab  Result Value Ref Range   SARS Coronavirus 2 NEGATIVE NEGATIVE    Comment: (NOTE) SARS-CoV-2 target nucleic acids are NOT DETECTED.  The SARS-CoV-2 RNA is generally detectable in upper and lower respiratory specimens during the acute phase of infection. Negative results do not preclude SARS-CoV-2 infection, do not rule out co-infections with other pathogens, and should not be used as the sole basis for treatment or other patient management decisions. Negative results must be combined with clinical observations, patient history, and epidemiological information. The expected result is Negative.  Fact Sheet for Patients: SugarRoll.be  Fact Sheet for Healthcare Providers: https://www.woods-mathews.com/  This test is not yet approved or cleared by the [place] FDA and  has been authorized for detection and/or diagnosis of SARS-CoV-2 by FDA under an Emergency Use Authorization (EUA). This EUA will remain  in effect (meaning this test can be used) for the duration of the COVID-19 declaration under Se ction 564(b)(1) of the Act, 21 U.S.C. section 360bbb-3(b)(1), unless the authorization is terminated or revoked sooner.  Performed at [facility], [place] [address], [place], [place] [address]   Surgical pathology     Status: None   Collection Time: 07/23/20  9:29 AM  Result Value Ref Range    SURGICAL PATHOLOGY  SURGICAL PATHOLOGY CASE: [id number] PATIENT: Prajwal Viglione Surgical Pathology Report     Specimen Submitted: A. Colon polyp x2, transverse; cs  Clinical History: positive colorectal screening with  cologuard, severe diverticulosis, colon polyps      DIAGNOSIS: A.  COLON POLYP X2, TRANSVERSE; COLD SNARE: - TUBULAR ADENOMA (2). - HYPERPLASTIC POLYP (MULTIPLE FRAGMENTS). - NEGATIVE FOR HIGH-GRADE DYSPLASIA AND MALIGNANCY.  GROSS DESCRIPTION: A. Labeled: Transverse colon polyp x2 cold snare Received: Formalin Collection time: 9:29 AM on 07/23/2020 Placed into formalin time: 9:29 AM on 07/23/2020 Tissue fragment(s): Multiple Size: Aggregate, 1.6 x 0.8 x 0.4 cm Description: Received are fragments of tan-pink soft tissue admixed with fecal matter.  The ratio of soft tissue to fecal matter is 80: 20. Entirely submitted in 1 cassette.  Final Diagnosis performed by Quay Burow, MD.   Electronically signed 07/24/2020 9:36:14AM The electronic signature indi cates that the named Attending Pathologist has evaluated the specimen Technical component performed at [facility], [address] [place], [place] [address] Lab: [phone] Dir: Rush Farmer, MD, MMM  Professional component performed at [facility], [facility], [place], [place], [place] [address] Lab: [phone] Dir: Dellia Nims. Reuel Derby, MD     Radiology No results found.  Assessment/Plan  Benign essential HTN blood pressure control important in reducing the progression of atherosclerotic disease and aneurysmal growth. On appropriate oral medications.   Pre-diabetes blood glucose control important in reducing the progression of atherosclerotic disease. Also, involved in wound healing. On appropriate medications.   HLD (hyperlipidemia) lipid control important in reducing the progression of atherosclerotic disease. Continue statin therapy   PAD (peripheral artery  disease) (HCC) Noninvasive studies today show ABIs of 0.91 on the right and 0.99 on the left with biphasic waveforms although the right digital pressure was slightly reduced.  No limb threatening symptoms.  Disease is fairly mild at this point.  No role for intervention.  Recheck in 1 year.  Abdominal aortic aneurysm (AAA) without rupture (HCC) Duplex today shows a stable infrarenal abdominal aortic aneurysm measuring 4.11 cm in maximal diameter.  Both common iliac arteries measure about 2 cm in maximal diameter.  This is reasonably stable from previous studies and does not require intervention at this time.  For an aneurysm of greater than 4 cm we will continue follow-up on 32-month intervals.  Continue to remain abstinent from tobacco and control blood pressure to reduce risk of aneurysm growth.    [name], MD  09/12/2020 10:19 AM    This note was created with Dragon medical transcription system.  Any errors from dictation are purely unintentional

## 2020-09-12 NOTE — Assessment & Plan Note (Signed)
lipid control important in reducing the progression of atherosclerotic disease. Continue statin therapy  

## 2020-09-12 NOTE — Patient Instructions (Signed)
Abdominal Aortic Aneurysm  An aneurysm is a bulge in a blood vessel that carries blood away from the heart (artery). It happens when blood pushes against a weak or damaged place on the wall of the blood vessel. An abdominal aortic aneurysm happens in the main blood vessel that carries blood away from the heart (aorta). Most aneurysms do not cause problems, but some do cause problems. If an aneurysm grows, it can burst or tear. This causes bleeding inside you. It is an emergency. It can be life-threatening. What are the causes? The exact cause of this condition is not known. What increases the risk? The following factors may make you more likely to develop this condition:  Being male and 71 years of age or older.  Being of [place] descent.  Using nicotine or tobacco now or in the past.  Having a family history of aneurysms.  Having any of these problems: ? Hardening of blood vessels that carry blood away from the heart. ? Irritation and swelling of the walls of blood vessels that carry blood away from the heart. ? Certain genetic problems. ? Being very overweight. ? An infection in the wall of your aorta. ? High cholesterol. ? High blood pressure (hypertension). What are the signs or symptoms? Symptoms depend on the size of your aneurysm and how fast it is growing. Most aneurysms grow slowly and do not cause symptoms. If symptoms happen, you may:  Have very bad pain in your belly (abdomen), side, or low back.  Feel full after eating only a little food.  Feel a throbbing lump in your belly.  Have these problems with your feet or toes: ? Pain. ? Skin turning blue. ? Sores.  Have trouble pooping (constipation).  Have trouble peeing (urinating). If your aneurysm bursts, you may:  Feel sudden, very bad pain in the belly, side, or back.  Feel like you may vomit.  Vomit.  Feel light-headed.  Faint. How is this treated? Treatment for this condition depends  on:  The size of your aneurysm.  How fast it is growing.  Your age.  Your risk of having the aneurysm burst. If your aneurysm is smaller than 2 inches (5 cm), your doctor may:  Check it often to see if it is growing. You may have an imaging test (ultrasound) to check it every 3-6 months, every year, or every few years.  Give you medicines for: ? High blood pressure. ? Pain. ? Infection. If your aneurysm is larger than 2 inches (5 cm), you may need surgery to fix it. Follow these instructions at home: Eating and drinking  Eat a heart-healthy diet. Eat a lot of: ? Fresh fruits and vegetables. ? Whole grains. ? Low-fat (lean) protein. ? Low-fat dairy products.  Avoid foods that are high in saturated fat and cholesterol. These foods include red meat and some dairy products.   Lifestyle  Do not use any products that contain nicotine or tobacco, such as cigarettes, e-cigarettes, and chewing tobacco. If you need help quitting, ask your doctor.  Stay active and get exercise. Ask your doctor how often to exercise and what types of exercise are safe for you.  Keep a healthy weight.      Alcohol use  Do not drink alcohol if: ? Your doctor tells you not to drink. ? You are pregnant, may be pregnant, or are planning to become pregnant.  If you drink alcohol: ? Limit how much you use to:  0-1 drink a day  for women.  0-2 drinks a day for men. ? Be aware of how much alcohol is in your drink. In the [place] one drink equals one 12 oz bottle of beer (355 mL), one 5 oz glass of wine (148 mL), or one 1 oz glass of hard liquor (44 mL). General instructions  Take over-the-counter and prescription medicines only as told by your doctor.  Keep your blood pressure in a normal range. Check it at regular times. Ask your doctor what level it should be.  Have regular checks of your levels of blood sugar (glucose) and cholesterol. Follow steps to keep these levels near normal.  Avoid heavy  lifting and activities that take a lot of effort. Ask what activities are safe for you.  If you can, learn your family's health history.  Keep all follow-up visits as told by your doctor. This is important. Contact a doctor if:  Your belly, side, or back hurts.  Your belly throbs.  You have a fever. Get help right away if:  You have sudden, bad pain in your belly, side, or back.  You feel like you may vomit or you vomit.  You feel light-headed or you faint.  Your heart beats fast when you stand.  You have sweaty skin that is cold to the touch (clammy).  You are short of breath.  You have trouble pooping.  You have trouble peeing. These symptoms may be an emergency. Do not wait to see if the symptoms will go away. Get medical help right away. Call your local emergency services (911 in the [place].S[place] Do not drive yourself to the hospital. Summary  An aneurysm is a bulge in one of the blood vessels that carry blood away from the heart (artery). An abdominal aortic aneurysm happens in the main blood vessel that carries blood away from the heart (aorta).  This condition can cause bleeding inside the body. It can be life-threatening.  Risk can rise if you are male, age 71 or older, and of [place] descent. Risk can also rise from nicotine or tobacco use or having aneurysms in the family.  Get help right away if you have symptoms of a burst aneurysm. This information is not intended to replace advice given to you by your health care provider. Make sure you discuss any questions you have with your health care provider. Document Revised: 04/13/2019 Document Reviewed: 04/13/2019 Elsevier Patient Education  2021 [employer].

## 2020-09-12 NOTE — Telephone Encounter (Signed)
Please review. Richard Wiggins pt. Thanks!

## 2020-09-12 NOTE — Assessment & Plan Note (Signed)
blood glucose control important in reducing the progression of atherosclerotic disease. Also, involved in wound healing. On appropriate medications.  

## 2020-09-12 NOTE — Assessment & Plan Note (Signed)
blood pressure control important in reducing the progression of atherosclerotic disease and aneurysmal growth. On appropriate oral medications.

## 2020-09-12 NOTE — Telephone Encounter (Signed)
Medication Refill - Medication: tamsulosin (FLOMAX) 0.4 MG CAPS capsule    Has the patient contacted their pharmacy? Yes.   (Agent: If no, request that the patient contact the pharmacy for the refill.) (Agent: If yes, when and what did the pharmacy advise?)RX expired   Preferred Pharmacy (with phone number or street name): [id number] - [place], [place] [address]  [address] [place] [address]  Phone:  [phone] Fax:  [phone]   Agent: Please be advised that RX refills may take up to 3 business days. We ask that you follow-up with your pharmacy.

## 2020-09-12 NOTE — Telephone Encounter (Signed)
Requested medication (s) are due for refill today - yes  Requested medication (s) are on the active medication list -yes  Future visit scheduled -no  Last refill: 3 months  Notes to clinic: Rx request filled by outside provider  Requested Prescriptions  Pending Prescriptions Disp Refills   tamsulosin (FLOMAX) 0.4 MG CAPS capsule 90 capsule 0      Urology: Alpha-Adrenergic Blocker Failed - 09/12/2020  1:14 PM      Failed - Last BP in normal range    BP Readings from Last 1 Encounters:  09/12/20 (!) 161/88          Passed - Valid encounter within last 12 months    Recent Outpatient Visits           1 month ago Encounter to establish care with new doctor   [place], Vickki Muff, PA-C                    Requested Prescriptions  Pending Prescriptions Disp Refills   tamsulosin (FLOMAX) 0.4 MG CAPS capsule 90 capsule 0      Urology: Alpha-Adrenergic Blocker Failed - 09/12/2020  1:14 PM      Failed - Last BP in normal range    BP Readings from Last 1 Encounters:  09/12/20 (!) 161/88          Passed - Valid encounter within last 12 months    Recent Outpatient Visits           1 month ago Encounter to establish care with new doctor   [place], Vickki Muff, [place]

## 2020-09-12 NOTE — Assessment & Plan Note (Signed)
Duplex today shows a stable infrarenal abdominal aortic aneurysm measuring 4.11 cm in maximal diameter.  Both common iliac arteries measure about 2 cm in maximal diameter.  This is reasonably stable from previous studies and does not require intervention at this time.  For an aneurysm of greater than 4 cm we will continue follow-up on 67-month intervals.  Continue to remain abstinent from tobacco and control blood pressure to reduce risk of aneurysm growth.

## 2020-09-12 NOTE — Assessment & Plan Note (Signed)
Noninvasive studies today show ABIs of 0.91 on the right and 0.99 on the left with biphasic waveforms although the right digital pressure was slightly reduced.  No limb threatening symptoms.  Disease is fairly mild at this point.  No role for intervention.  Recheck in 1 year.

## 2020-09-29 ENCOUNTER — Ambulatory Visit
Admission: RE | Admit: 2020-09-29 | Discharge: 2020-09-29 | Disposition: A | Discharge status: Home / Self Care | Payer: Medicare Other | Source: Ambulatory Visit | Attending: Oncology | Admitting: Oncology

## 2020-09-29 ENCOUNTER — Other Ambulatory Visit: Payer: Self-pay

## 2020-09-29 DIAGNOSIS — Z85118 Personal history of other malignant neoplasm of bronchus and lung: Secondary | ICD-10-CM | POA: Diagnosis not present

## 2020-09-29 DIAGNOSIS — C3412 Malignant neoplasm of upper lobe, left bronchus or lung: Secondary | ICD-10-CM | POA: Diagnosis not present

## 2020-09-29 DIAGNOSIS — I358 Other nonrheumatic aortic valve disorders: Secondary | ICD-10-CM | POA: Diagnosis not present

## 2020-09-29 DIAGNOSIS — I2510 Atherosclerotic heart disease of native coronary artery without angina pectoris: Secondary | ICD-10-CM | POA: Diagnosis not present

## 2020-09-29 DIAGNOSIS — J432 Centrilobular emphysema: Secondary | ICD-10-CM | POA: Diagnosis not present

## 2020-10-02 ENCOUNTER — Ambulatory Visit
Admission: RE | Admit: 2020-10-02 | Discharge: 2020-10-02 | Disposition: A | Discharge status: Home / Self Care | Payer: Medicare Other | Source: Ambulatory Visit | Attending: Radiation Oncology | Admitting: Radiation Oncology

## 2020-10-02 ENCOUNTER — Inpatient Hospital Stay: Payer: Medicare Other | Attending: Oncology | Admitting: Oncology

## 2020-10-02 ENCOUNTER — Encounter: Payer: Self-pay | Admitting: Oncology

## 2020-10-02 VITALS — BP 120/73 | HR 71 | Temp 97.1°F | Resp 18 | Wt 208.5 lb

## 2020-10-02 DIAGNOSIS — C3490 Malignant neoplasm of unspecified part of unspecified bronchus or lung: Secondary | ICD-10-CM

## 2020-10-02 DIAGNOSIS — Z85118 Personal history of other malignant neoplasm of bronchus and lung: Secondary | ICD-10-CM

## 2020-10-02 DIAGNOSIS — Z08 Encounter for follow-up examination after completed treatment for malignant neoplasm: Secondary | ICD-10-CM | POA: Diagnosis not present

## 2020-10-02 DIAGNOSIS — Z87891 Personal history of nicotine dependence: Secondary | ICD-10-CM | POA: Diagnosis not present

## 2020-10-02 DIAGNOSIS — C3412 Malignant neoplasm of upper lobe, left bronchus or lung: Secondary | ICD-10-CM | POA: Diagnosis not present

## 2020-10-02 DIAGNOSIS — Z923 Personal history of irradiation: Secondary | ICD-10-CM | POA: Diagnosis not present

## 2020-10-02 NOTE — Progress Notes (Signed)
Survivorship Care Plan visit completed.  Treatment summary reviewed and given to patient.  ASCO answers booklet reviewed and given to patient.  CARE program and Cancer Transitions discussed with patient along with other resources cancer center offers to patients and caregivers.  Patient verbalized understanding.    

## 2020-10-02 NOTE — Progress Notes (Signed)
Radiation Oncology Follow up Note  Name: Richard Wiggins   Date:   10/02/2020 MRN:  [id number] DOB: 1951/04/21    This 70 y.o. male presents to the clinic today for 109-month follow-up status post SBRT to his left upper lobe for presumed stage I non-small cell lung cancer.  REFERRING PROVIDER: Margo Common, PA-C  HPI: Patient is a 64-year-old male now out 3 months having completed SBRT to his left upper lobe for presumed stage I non-small cell lung cancer.  Clinically he is doing well has a mild nonproductive cough no hemoptysis or chest tightness.  He is having no dysphagia.[name]  He had a recent CT scan showing left upper lobe pulmonary nodule stable to minimally increased in size I believe is exactly the same as prior.  He has a right upper lobe pulmonary nodule again small slightly increased in size although minimal.  COMPLICATIONS OF TREATMENT: none  FOLLOW UP COMPLIANCE: keeps appointments   PHYSICAL EXAM:  There were no vitals taken for this visit. Well-developed well-nourished patient in NAD. HEENT reveals PERLA, EOMI, discs not visualized.  Oral cavity is clear. No oral mucosal lesions are identified. Neck is clear without evidence of cervical or supraclavicular adenopathy. Lungs are clear to A&P. Cardiac examination is essentially unremarkable with regular rate and rhythm without murmur rub or thrill. Abdomen is benign with no organomegaly or masses noted. Motor sensory and DTR levels are equal and symmetric in the upper and lower extremities. Cranial nerves II through XII are grossly intact. Proprioception is intact. No peripheral adenopathy or edema is identified. No motor or sensory levels are noted. Crude visual fields are within normal range.  RADIOLOGY RESULTS: CT scan reviewed compatible with above-stated findings  PLAN: Present time patient is doing well he has a repeat CT scan which I will review in 3 months.  Believe the treated lesion of the left upper lobe is stable  compatible with treatment response.  Right upper lobe nodule should it show progressive increase in size would offer SBRT to that lesion also.  Patient and wife both comprehend the recommendations well.  I have asked to see him back in 6 months for follow-up.  I would like to take this opportunity to thank you for allowing me to participate in the care of your patient.Noreene Filbert, MD

## 2020-10-02 NOTE — Progress Notes (Signed)
Hematology/Oncology Consult note [facility]  [phone] [phone]  Patient Care Team: Chrismon, Vickki Muff, PA-C as PCP - General (Family Medicine) Telford Nab, RN as Oncology Nurse Navigator Noreene Filbert, MD as Referring Physician (Radiation Oncology) Sindy Guadeloupe, MD as Consulting Physician (Oncology)   Name of the patient: Richard Wiggins  [id number]  01-05-1951   Date of visit: 10/02/20  Diagnosis- stage I lung cancer s/p SBRT  Chief complaint/ Reason for visit-discuss CT scan results and further management  Heme/Onc history: Patient is a 70 year old male who was a former smoker and quit in 2018. He went to the ER in July 2021 for possible strokelike symptoms and was found to have a left upper lobe lung nodule which was followed up with a CT chest which showed a 1.2 x 1.1 cm left upper lobe spiculated nodule. This was followed by a PET scan which showed that nodule was hypermetabolic with an SUV of 6.56 patient was also incidentally noted to have a 4.3 cm abdominal aortic aneurysm. He follows up with Dr. Lucky Cowboy for his aneurysm. He was seen by Dr. Patsey Berthold and underwent ENB. Biopsy was nondiagnostic. However given that the nodule was hypermetabolic it was concerning for malignancy and recommendation per Dr. Patsey Berthold was empiric radiation. Patient underwent SBRT to his left upper lobe by Dr. Baruch Gouty  Patient also has a positive fit test.  Colonoscopy in January 2022 showed no evidence of colon cancer.  There wereCouple of polyps which were resected and were consistent with tubular adenoma  Interval history-patient reports feeling at his baseline state of health.  Denies any specific complaints at this time.  ECOG PS- 1 Pain scale- 0  Review of systems- Review of Systems  Constitutional: Negative for chills, fever, malaise/fatigue and weight loss.  HENT: Negative for congestion, ear discharge and nosebleeds.   Eyes:  Negative for blurred vision.  Respiratory: Negative for cough, hemoptysis, sputum production, shortness of breath and wheezing.   Cardiovascular: Negative for chest pain, palpitations, orthopnea and claudication.  Gastrointestinal: Negative for abdominal pain, blood in stool, constipation, diarrhea, heartburn, melena, nausea and vomiting.  Genitourinary: Negative for dysuria, flank pain, frequency, hematuria and urgency.  Musculoskeletal: Negative for back pain, joint pain and myalgias.  Skin: Negative for rash.  Neurological: Negative for dizziness, tingling, focal weakness, seizures, weakness and headaches.  Endo/Heme/Allergies: Does not bruise/bleed easily.  Psychiatric/Behavioral: Negative for depression and suicidal ideas. The patient does not have insomnia.       No Known Allergies   Past Medical History:  Diagnosis Date  . 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 Anemia 02/09/2020  . Anxiety   . Benign essential HTN 02/10/2020  . BPH (benign prostatic hyperplasia) 02/09/2020  . Brain aneurysm   . Cancer ([place])   . CHF (congestive heart failure) ([place])   . Chronic kidney disease   . COPD (chronic obstructive pulmonary disease) ([place])   . Coronary artery disease   . Depression   . Dysrhythmia   . Emphysema of lung ([place])   . History of common carotid artery stent placement   . Hypertension   . IDA (iron deficiency anemia) 02/09/2020  . 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 Marijuana use, continuous 02/10/2020  . Osteoporosis   . PAF (paroxysmal atrial fibrillation) ([place])   . Pulmonary emboli ([place]) 06/07/2017   Right lower lobe pulmonary embolism small segmental, multifocal multifocal  pneumonia, mediastinal lymphadenopathy, moderate centrilobular emphysema  . Seizures ([place])   . Stroke [facility])    left sided weakness to arm and leg     Past Surgical History:  Procedure Laterality Date  .  brain aneurysm with clip    . COLONOSCOPY WITH PROPOFOL N/A 07/23/2020   Procedure: COLONOSCOPY WITH PROPOFOL;  Surgeon: Lin Landsman, MD;  Location: [facility] ENDOSCOPY;  Service: Gastroenterology;  Laterality: N/A;  . CORONARY ANGIOPLASTY WITH STENT PLACEMENT  07/08/2017  . hip repleacement Right    Right total hip arthroplasty  . VIDEO BRONCHOSCOPY WITH ENDOBRONCHIAL NAVIGATION N/A 04/23/2020   Procedure: VIDEO BRONCHOSCOPY WITH ENDOBRONCHIAL NAVIGATION;  Surgeon: Tyler Pita, MD;  Location: [facility] ORS;  Service: Pulmonary;  Laterality: N/A;    Social History   Socioeconomic History  . Marital status: Married    Spouse name: Not on file  . Number of children: Not on file  . Years of education: Not on file  . Highest education level: High school graduate  Occupational History  . Occupation: Retired  Tobacco Use  . Smoking status: Former Smoker    Packs/day: 2.00    Years: 53.00    Pack years: 106.00    Types: Cigarettes    Quit date: 05/28/2017    Years since quitting: 3.3  . Smokeless tobacco: Never Used  Vaping Use  . Vaping Use: Former  . Quit date: 01/15/2020  . Devices: cbd   Substance and Sexual Activity  . Alcohol use: Yes    Comment: socially drink cocktail  . Drug use: Yes    Types: Marijuana    Comment: last smoke x1 month ago  . Sexual activity: Not Currently  Other Topics Concern  . Not on file  Social History Narrative   Lives with wife. 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 Children x2 children and grandchildren grown.    Social Determinants of Health   Financial Resource Strain: Not on file  Food Insecurity: Not on file  Transportation Needs: Not on file  Physical Activity: Not on file  Stress: Not on file  Social Connections: Not on file  Intimate Partner Violence: Not on file    Family History  Problem Relation Age of Onset  . Diabetes Mother   . Heart disease Mother   . Stroke Father   . Heart disease Father   . Heart  attack Father   . Alcohol abuse Father   . Heart disease Sister   . Heart attack Sister   . Heart disease Maternal Grandmother   . Heart attack Maternal Grandmother   . Heart attack Paternal Grandmother   . Diabetes Brother   . Heart disease Brother   . Heart attack Brother      Current Outpatient Medications:  .  apixaban (ELIQUIS) 5 MG TABS tablet, Take 1 tablet (5 mg total) by mouth in the morning and at bedtime., Disp: 90 tablet, Rfl: 1 .  aspirin EC 81 MG tablet, Take 81 mg by mouth daily. Swallow whole., Disp: , Rfl:  .  atorvastatin (LIPITOR) 40 MG tablet, TAKE 1 TABLET(40 MG) BY MOUTH AT BEDTIME, Disp: 90 tablet, Rfl: 0 .  budesonide-formoterol (SYMBICORT) 160-4.5 MCG/ACT inhaler, Inhale 2 puffs into the lungs 2 (two) times daily., Disp: , Rfl:  .  carvedilol (COREG) 25 MG tablet, Take 12.5 mg by mouth in the morning and at bedtime., Disp: , Rfl:  .  furosemide (LASIX) 40 MG tablet, Take 1 tablet (40 mg total) by mouth  as needed., Disp: 30 tablet, Rfl: 3 .  lamoTRIgine (LAMICTAL) 25 MG tablet, Take 25 mg by mouth daily. 1 q hs x 2 weeks then BID, Disp: , Rfl:  .  Lidocaine HCl-Benzyl Alcohol (SALONPAS LIDOCAINE PLUS EX), Place 1 patch onto the skin daily as needed (pain.)., Disp: , Rfl:  .  sacubitril-valsartan (ENTRESTO) 49-51 MG, Take 0.5 tablets by mouth in the morning and at bedtime., Disp: , Rfl:  .  spironolactone (ALDACTONE) 25 MG tablet, Take 12.5 mg by mouth at bedtime., Disp: , Rfl:  .  tamsulosin (FLOMAX) 0.4 MG CAPS capsule, Take 1 capsule (0.4 mg total) by mouth daily., Disp: 90 capsule, Rfl: 0 .  ipratropium-albuterol (DUONEB) 0.5-2.5 (3) MG/3ML SOLN, Inhale 3 mLs into the lungs every 6 (six) hours as needed. Wheezing shortness of breath (Patient not taking: Reported on 10/02/2020), Disp: , Rfl:   Physical exam:  Vitals:   10/02/20 0948  BP: 120/73  Pulse: 71  Resp: 18  Temp: (!) 97.1 F (36.2 C)  TempSrc: Tympanic  SpO2: 100%  Weight: 208 lb 8 oz (94.6 kg)    Physical Exam Constitutional:      General: He is not in acute distress. Cardiovascular:     Rate and Rhythm: Normal rate and regular rhythm.     Heart sounds: Normal heart sounds.  Pulmonary:     Effort: Pulmonary effort is normal.     Breath sounds: Normal breath sounds.  Skin:    General: Skin is warm and dry.  Neurological:     Mental Status: He is alert and oriented to person, place, and time.      CMP Latest Ref Rng & Units 07/01/2020  Glucose 70 - 99 mg/dL 66(L)  BUN 8 - 23 mg/dL 28(H)  Creatinine 0.61 - 1.24 mg/dL 1.45(H)  Sodium 135 - 145 mmol/L 141  Potassium 3.5 - 5.1 mmol/L 4.0  Chloride 98 - 111 mmol/L 104  CO2 22 - 32 mmol/L 29  Calcium 8.9 - 10.3 mg/dL 9.5  Total Protein 6.0 - 8.3 g/dL -  Total Bilirubin 0.2 - 1.2 mg/dL -  Alkaline Phos 39 - 117 U/L -  AST 0 - 37 U/L -  ALT 0 - 53 U/L -   CBC Latest Ref Rng & Units 05/27/2020  WBC 4.0 - 10.5 K/uL 4.6  Hemoglobin 13.0 - 17.0 g/dL 12.1(L)  Hematocrit 39.0 - 52.0 % 36.4(L)  Platelets 150.0 - 400.0 K/uL 159.0    No images are attached to the encounter.  CT Chest Wo Contrast  Result Date: 09/30/2020 CLINICAL DATA:  70 year old male with history of non-small cell lung cancer. Restaging examination. EXAM: CT CHEST WITHOUT CONTRAST TECHNIQUE: Multidetector CT imaging of the chest was performed following the standard protocol without IV contrast. COMPARISON:  Chest CT 07/01/2020. FINDINGS: Cardiovascular: Heart size is normal. There is no significant pericardial fluid, thickening or pericardial calcification. There is aortic atherosclerosis, as well as atherosclerosis of the great vessels of the mediastinum and the coronary arteries, including calcified atherosclerotic plaque in the left main, left anterior descending, left circumflex and right coronary arteries. Very mild calcification of the aortic valve. Mediastinum/Nodes: No pathologically enlarged mediastinal or hilar lymph nodes. Please note that accurate  exclusion of hilar adenopathy is limited on noncontrast CT scans. Esophagus is unremarkable in appearance. No axillary lymphadenopathy. Lungs/Pleura: Previously noted nodule in the posterior aspect of the left upper lobe currently measures 2.0 x 1.2 x 1.2 cm (coronal image 102 of series 5 and axial  image 44 of series 3), previously measuring 2.1 x 1.3 x 0.6 cm, similar to slightly increased in size. Previously noted right upper lobe pulmonary nodule is slightly increased in size, currently measuring 1.0 x 0.4 cm (axial image 35 of series 3), previously 7 x 4 mm. A few other scattered tiny subcentimeter pulmonary nodules are noted, stable compared to prior examinations, largest of which is in the right lower lobe (axial image 101 of series 3) measuring 8 x 6 mm. No acute consolidative airspace disease. No pleural effusions. Diffuse bronchial wall thickening with mild centrilobular and paraseptal emphysema. Upper Abdomen: Aortic atherosclerosis. Exophytic 1.9 cm low-attenuation lesion in the upper pole of the left kidney, incompletely characterized on today's non-contrast CT examination, but similar to the prior study and statistically likely to represent a cyst. Musculoskeletal: There are no aggressive appearing lytic or blastic lesions noted in the visualized portions of the skeleton. IMPRESSION: 1. Previously noted left upper lobe pulmonary nodule is stable to minimally increased in size compared to the prior examination. Previously noted right upper lobe pulmonary nodule is increased in size compared to prior examination. All other smaller pulmonary nodules are stable compared to prior studies. 2. Diffuse bronchial wall thickening with mild centrilobular and paraseptal emphysema; imaging findings suggestive of underlying COPD. 3. Aortic atherosclerosis, in addition to left main and 3 vessel coronary artery disease. Assessment for potential risk factor modification, dietary therapy or pharmacologic therapy may be  warranted, if clinically indicated. 4. There are calcifications of the aortic valve. Echocardiographic correlation for evaluation of potential valvular dysfunction may be warranted if clinically indicated. Aortic Atherosclerosis (ICD10-I70.0). Electronically Signed   By: Vinnie Langton M.D.   On: 09/30/2020 07:08   VAS [place] ABI WITH/WO TBI  Result Date: 09/16/2020 LOWER EXTREMITY DOPPLER STUDY Indications: Peripheral artery disease.  Performing Technologist: Almira Coaster RVS  Examination Guidelines: A complete evaluation includes at minimum, Doppler waveform signals and systolic blood pressure reading at the level of bilateral brachial, anterior tibial, and posterior tibial arteries, when vessel segments are accessible. Bilateral testing is considered an integral part of a complete examination. Photoelectric Plethysmograph (PPG) waveforms and toe systolic pressure readings are included as required and additional duplex testing as needed. Limited examinations for reoccurring indications may be performed as noted.  ABI Findings: +---------+------------------+-----+--------+--------+ Right    Rt Pressure (mmHg)IndexWaveformComment  +---------+------------------+-----+--------+--------+ Brachial 162                                     +---------+------------------+-----+--------+--------+ ATA      146               0.90 biphasic         +---------+------------------+-----+--------+--------+ PTA      149               0.91 biphasic         +---------+------------------+-----+--------+--------+ Great Toe51                0.31 Abnormal         +---------+------------------+-----+--------+--------+ +---------+------------------+-----+---------+-------+ Left     Lt Pressure (mmHg)IndexWaveform Comment +---------+------------------+-----+---------+-------+ Brachial 163                                     +---------+------------------+-----+---------+-------+ ATA      147  0.90 biphasic         +---------+------------------+-----+---------+-------+ PTA      162               0.99 triphasic        +---------+------------------+-----+---------+-------+ Great Toe156               0.96 Normal           +---------+------------------+-----+---------+-------+ +-------+-----------+-----------+------------+------------+ ABI/TBIToday's ABIToday's TBIPrevious ABIPrevious TBI +-------+-----------+-----------+------------+------------+ Right  .91        .31                                 +-------+-----------+-----------+------------+------------+ Left   .99        .96                                 +-------+-----------+-----------+------------+------------+  Summary: Right: Resting right ankle-brachial index indicates mild right lower extremity arterial disease. The right toe-brachial index is abnormal. Left: Resting left ankle-brachial index is within normal range. No evidence of significant left lower extremity arterial disease. The left toe-brachial index is normal.  *See table(s) above for measurements and observations.  Electronically signed by [name] MD on 09/16/2020 at 2:42:58 PM.   Final    AAA Duplex  Result Date: 09/16/2020 ABDOMINAL AORTA STUDY Indications: Follow up exam for known AAA.  Performing Technologist: Almira Coaster RVS  Examination Guidelines: A complete evaluation includes B-mode imaging, spectral Doppler, color Doppler, and power Doppler as needed of all accessible portions of each vessel. Bilateral testing is considered an integral part of a complete examination. Limited examinations for reoccurring indications may be performed as noted.  Abdominal Aorta Findings: +-----------+-------+----------+----------+----------+--------+--------+ Location   AP (cm)Trans (cm)PSV (cm/s)Waveform  ThrombusComments +-----------+-------+----------+----------+----------+--------+--------+ Proximal   2.82   3.04      64         monophasic                 +-----------+-------+----------+----------+----------+--------+--------+ Mid        3.00   3.29      75        biphasic                   +-----------+-------+----------+----------+----------+--------+--------+ Distal     3.71   4.11      46        monophasic                 +-----------+-------+----------+----------+----------+--------+--------+ RT CIA Prox2.0    2.0       171       biphasic                   +-----------+-------+----------+----------+----------+--------+--------+ LT CIA Prox1.4    1.6       200       biphasic                   +-----------+-------+----------+----------+----------+--------+--------+ LT EIA Prox0.8    1.0       107       biphasic                   +-----------+-------+----------+----------+----------+--------+--------+  Summary: Abdominal Aorta: There is evidence of abnormal dilatation of the mid, proximal and distal Abdominal aorta. There is evidence of abnormal dilation of the Right Common Iliac artery and Left Common Iliac artery. The  largest aortic measurement is 4.1 cm.  *See table(s) above for measurements and observations.  Electronically signed by [name] MD on 09/16/2020 at 2:43:01 PM.   Final      Assessment and plan- Patient is a 70 y.o. male with history of stage I presumed lung cancer of the left upper lobe s/p SBRT.  He is here to discuss CT scan results and further management  I have reviewed CT chest images independently and discussed findings with the patient.  His treated left upper lobe lesion is overall stable to mildly enlarged.  There is mild enlargement of the right upper lobe lesion from 7 mm to 1 mm.  At this time I am inclined to monitor this conservatively without a PET scan or repeat biopsy at this time.  If there is a continued growth in either of these nodules we will consider getting a PET scan.  Repeat CT chest without contrast in 3 months and I will see him thereafter    Visit Diagnosis 1. Malignant neoplasm of upper lobe of left lung ([place])   2. Encounter for follow-up surveillance of lung cancer      Dr. Randa Evens, MD, MPH [facility] at [facility] [phone] 10/02/2020 12:24 PM

## 2020-10-09 ENCOUNTER — Telehealth: Payer: Self-pay | Admitting: *Deleted

## 2020-10-09 ENCOUNTER — Telehealth: Payer: Self-pay | Admitting: Pulmonary Disease

## 2020-10-09 MED ORDER — AZITHROMYCIN 250 MG PO TABS: ORAL_TABLET | ORAL | 0 refills | Status: AC

## 2020-10-09 MED ORDER — PREDNISONE 10 MG (21) PO TBPK: ORAL_TABLET | ORAL | 0 refills | Status: DC

## 2020-10-09 NOTE — Telephone Encounter (Signed)
Patient's spouse, Donna(DPR) is aware of below recommendations. Rx for zpak and prednisone has been sent to preferred pharmacy.  Nothing further is needed at this time.

## 2020-10-09 NOTE — Telephone Encounter (Signed)
Lm for patient's spouse, Donna(DPR)

## 2020-10-09 NOTE — Telephone Encounter (Addendum)
Spoke to patient's spouse, Donna(DPR). Butch Penny stated that patient is experiencing increased cough and sob. Cough is productive with clear sputum. Sx have been present for 1.5wk Wheezing is baseline. Denied fever, chills or sweats He is using albuterol 4-6x daily, throat lozenges and Symbicort BID.   Dr. Patsey Berthold, please advise. Thanks

## 2020-10-09 NOTE — Telephone Encounter (Signed)
This was handled by Dr Duwayne Heck' office

## 2020-10-09 NOTE — Telephone Encounter (Signed)
Lets treat him with Azithromycin Z-Pak and a prednisone taper.  This may be related to seasonal allergies/bronchitis.  Recommend follow-up with me or nurse practitioner in next few weeks.  If no better in 48 to 72 hours should be seen at either ED or urgent care.

## 2020-10-09 NOTE — Telephone Encounter (Signed)
Lm for patient on home number listed on file.

## 2020-10-09 NOTE — Telephone Encounter (Signed)
Butch Penny called reporting that patient is having "coughing spells, really horrendous coughing spells", she is not sure if she is to call us or his pulmonologist not sure if it is his lung cancer or COPD, He is using his Nebulizer, Symbicort and his rescue inhaler to help with his breathing. She is asking what else they can do. Please advsie

## 2020-10-13 ENCOUNTER — Encounter: Payer: Self-pay | Admitting: Pulmonary Disease

## 2020-10-13 ENCOUNTER — Ambulatory Visit: Payer: Medicare Other | Admitting: Pulmonary Disease

## 2020-10-13 ENCOUNTER — Other Ambulatory Visit: Payer: Self-pay

## 2020-10-13 VITALS — BP 140/82 | HR 67 | Temp 98.2°F | Ht 70.0 in | Wt 213.0 lb

## 2020-10-13 DIAGNOSIS — R059 Cough, unspecified: Secondary | ICD-10-CM

## 2020-10-13 DIAGNOSIS — J441 Chronic obstructive pulmonary disease with (acute) exacerbation: Secondary | ICD-10-CM

## 2020-10-13 MED ORDER — ALBUTEROL SULFATE (2.5 MG/3ML) 0.083% IN NEBU: 2.5000 mg | INHALATION_SOLUTION | Freq: Four times a day (QID) | RESPIRATORY_TRACT | 6 refills | Status: DC | PRN

## 2020-10-13 MED ORDER — BREZTRI AEROSPHERE 160-9-4.8 MCG/ACT IN AERO: 2.0000 | INHALATION_SPRAY | Freq: Two times a day (BID) | RESPIRATORY_TRACT | 0 refills | Status: DC

## 2020-10-13 NOTE — Progress Notes (Signed)
Subjective:    Patient ID: Richard Wiggins, male    DOB: 1951/05/07, 70 y.o.   MRN: [id number]  HPI Patient is a 70 year old former smoker (quit 2018) who presents due to cough productive of yellowish sputum.  He had previously been seen here for a posterior LEFT upper lobe nodule measuring 1.2 x 1.1 cm.  He had navigational bronchoscopy which failed to show malignant cells.  However the nodule was FDG avid on PET/CT and patient has undergone SBRT.  The patient had called on 31 March stating that he had had cough for approximately 1 and 1/2 weeks.  He had been using DuoNeb 4-6 times daily and Symbicort twice a day.  Wheezing was at baseline.  He did not notice any fever chills or sweats.  He was treated with Azithromycin and prednisone which he is still taking and completing.  He follows up today for this issue.  He notes that this morning he started to feel markedly better and feels that the medications are "kicking in".  He had previously been on Trelegy however, this was changed by the [place] due to not being on formulary.  He has been on Symbicort.  He was supposed to be also on Incruse but is not taking this medication.  He notes that Trelegy did help him previously.  We had not seen him here since his procedure in October 2021.  He states that he had not had issues until now.   Review of Systems A 10 point review of systems was performed and it is as noted above otherwise negative.    Patient Active Problem List   Diagnosis Date Noted  . Positive colorectal cancer screening using Cologuard test 05/22/2020  . Goals of care, counseling/discussion 05/06/2020  . Malignant neoplasm of upper lobe of left lung ([place]) 05/06/2020  . Preventative health care 03/26/2020  . Atherosclerosis of native arteries of extremity with intermittent claudication ([place]) 03/14/2020  . Trigger middle finger of right hand 02/20/2020  . Abdominal aortic aneurysm (AAA) without rupture ([place]) 02/20/2020  . Indeterminate  pulmonary nodules 02/10/2020  . Marijuana use, continuous 02/10/2020  . Benign essential HTN 02/10/2020  . Pre-diabetes 02/10/2020  . BPH (benign prostatic hyperplasia) 02/09/2020  . Anemia 02/09/2020  . Weakness of lower extremity 02/01/2020  . COPD (chronic obstructive pulmonary disease) ([place]) 02/01/2020  . Chronic systolic CHF (congestive heart failure) ([place]) 02/01/2020  . Depression 02/01/2020  . GERD (gastroesophageal reflux disease) 02/01/2020  . A-fib ([place]) 02/01/2020  . CVA (cerebral vascular accident) ([place]) 02/01/2020  . CAD (coronary artery disease), native coronary artery 02/01/2020  . Carotid artery stenosis 02/01/2020  . HLD (hyperlipidemia) 02/01/2020  . Carotid stenosis, asymptomatic, left 02/14/2018  . S/P coronary artery stent placement 09/20/2017  . Centrilobular emphysema ([place]) 06/10/2017  . Chronic back pain 06/10/2017  . DOE (dyspnea on exertion) 06/10/2017  . Elevated TSH 06/10/2017  . Hematoma of groin 06/10/2017  . PAD (peripheral artery disease) ([place]) 06/10/2017  . Pulmonary embolism ([place]) 06/10/2017  . Tobacco abuse 06/10/2017  . Unintended weight loss 06/10/2017  . AAA (abdominal aortic aneurysm) ([place]) 06/10/2017  . PAF (paroxysmal atrial fibrillation) ([place]) 06/10/2017  . Systolic CHF, chronic ([place]) 06/10/2017  . Hypertension 05/31/2017  . Other hyperlipidemia 05/31/2017  . Hemiparesis affecting left side as late effect of cerebrovascular accident ([place]) 05/29/2017  . Carotid artery stenosis, symptomatic, right 05/29/2017  . Embolic stroke involving right middle cerebral artery ([place]) 05/28/2017    No Known Allergies  Current Meds  Medication Sig  . apixaban (ELIQUIS) 5 MG TABS tablet Take 1 tablet (5 mg total) by mouth in the morning and at bedtime.  [name] aspirin EC 81 MG tablet Take 81 mg by mouth daily. Swallow whole.  [name] atorvastatin (LIPITOR) 40 MG tablet TAKE 1 TABLET(40 MG) BY MOUTH AT BEDTIME  . azithromycin (ZITHROMAX) 250 MG tablet Take 2  tablets (500 mg) on  Day 1,  followed by 1 tablet (250 mg) once daily on Days 2 through 5.  . budesonide-formoterol (SYMBICORT) 160-4.5 MCG/ACT inhaler Inhale 2 puffs into the lungs 2 (two) times daily.  . carvedilol (COREG) 25 MG tablet Take 12.5 mg by mouth in the morning and at bedtime.  [name] ipratropium-albuterol (DUONEB) 0.5-2.5 (3) MG/3ML SOLN Inhale 3 mLs into the lungs every 6 (six) hours as needed. Wheezing shortness of breath  . lamoTRIgine (LAMICTAL) 25 MG tablet Take 25 mg by mouth daily. 1 q hs x 2 weeks then BID  . Lidocaine HCl-Benzyl Alcohol (SALONPAS LIDOCAINE PLUS EX) Place 1 patch onto the skin daily as needed (pain.).  [name] predniSONE (STERAPRED UNI-PAK 21 TAB) 10 MG (21) TBPK tablet Use as directed  . sacubitril-valsartan (ENTRESTO) 49-51 MG Take 0.5 tablets by mouth in the morning and at bedtime.  [name] spironolactone (ALDACTONE) 25 MG tablet Take 12.5 mg by mouth at bedtime.  . tamsulosin (FLOMAX) 0.4 MG CAPS capsule Take 1 capsule (0.4 mg total) by mouth daily.   Immunization History  Administered Date(s) Administered  . Fluad Quad(high Dose 65+) 03/26/2020  . Influenza,inj,Quad PF,6+ Mos 06/02/2017  . Influenza-Unspecified 07/12/1997, 04/23/2019  . Moderna Sars-Covid-2 Vaccination 09/12/2019, 10/10/2019, 04/20/2020  . Pneumococcal Polysaccharide-23 06/02/2017  . Tdap 02/09/2018  . Zoster Recombinat (Shingrix) 05/21/2019, 10/19/2019       Objective:   Physical Exam BP 140/82 (BP Location: Left Arm, Cuff Size: Normal)   Pulse 67   Temp 98.2 F (36.8 C) (Temporal)   Ht 5\' 10"  (1.778 m)   Wt 213 lb (96.6 kg)   SpO2 98%   BMI 30.56 kg/m  GENERAL:Awake, alert, fully ambulatory. Flat affect.  No conversational dyspnea.  No distress. HEAD: Normocephalic, atraumatic.  EYES: Pupils equal, round, reactive to light. No scleral icterus.  MOUTH:Nose/mouth/throat not examined due to masking requirements for COVID 19. NECK: Supple. No thyromegaly. Trachea midline. No JVD. No  adenopathy. PULMONARY:Good air entry bilaterally. Coarse breath sounds, no other adventitious sounds. CARDIOVASCULAR: S1 and S2. Regular rate and rhythm.  No rubs, murmurs or gallops heard. GASTROINTESTINAL:Protuberant abdomen, otherwise benign. MUSCULOSKELETAL: No joint deformity, no clubbing, no edema.  NEUROLOGIC: No focal deficit, no gait disturbance, speech is fluent. SKIN: Intact,warm,dry.Limited exam no rashes. PSYCH:Flat affect, calm and cooperative.     Assessment & Plan:     ICD-10-CM   1. COPD with acute exacerbation (HCC)  J44.1    Complete Azithromycin and prednisone taper Breztri 2 puffs twice a day Discontinue Symbicort Albuterol for nebulizer use as needed  2. Cough  R05.9 Budeson-Glycopyrrol-Formoterol (BREZTRI AEROSPHERE) 160-9-4.8 MCG/ACT AERO   Improved on current regimen Likely due to allergic versus infectious bronchitis   Meds ordered this encounter  Medications  . Budeson-Glycopyrrol-Formoterol (BREZTRI AEROSPHERE) 160-9-4.8 MCG/ACT AERO    Sig: Inhale 2 puffs into the lungs in the morning and at bedtime.    Dispense:  160 g    Refill:  0    Order Specific Question:   Lot Number?    Answer:   [id number]    Order Specific Question:   Expiration  Date?    Answer:   10/08/2021    Order Specific Question:   Quantity    Answer:   2  . albuterol (PROVENTIL) (2.5 MG/3ML) 0.083% nebulizer solution    Sig: Take 3 mLs (2.5 mg total) by nebulization every 6 (six) hours as needed for wheezing or shortness of breath.    Dispense:  75 mL    Refill:  6   Discussion:  Patient appears to have had a COPD exacerbation query whether this was due to allergic versus infectious bronchitis.  He is improving with the current regimen.  We have given him a trial of Breztri 2 puffs twice a day plus were provided for the patient.  He is also provided with a spacer and full instructions of how to use the medication.  He is to discontinue Symbicort.  He is to let us know how the  Judithann Sauger works for him.  We will see him in follow-up in 4 to 6 weeks time he is to contact us sooner should any new difficulties arise or should his symptoms fail to improve.      Renold Don, MD [facility]   *This note was dictated using voice recognition software/Dragon.  Despite best efforts to proofread, errors can occur which can change the meaning.  Any change was purely unintentional.

## 2020-10-13 NOTE — Patient Instructions (Addendum)
Continue prednisone Azithromycin until completed.   We are giving you samples of Breztri 2 puffs twice a day.  There is a spacer for you to use with the Breztri.  Make sure you rinse your mouth well after you use it.  Let us know how you are doing with this medication so we can call it into your pharmacy.   DO NOT USE SYMBICORT OR SPIRIVA WHILE USING THE BREZTRI.   I have sent a new nebulizer medication to your pharmacy.   We will see you in follow-up in 4 to 6 weeks time, call sooner should any new difficulties arise or if your symptoms fail to improve.

## 2020-10-25 ENCOUNTER — Other Ambulatory Visit: Payer: Self-pay | Admitting: Family Medicine

## 2020-10-25 DIAGNOSIS — I639 Cerebral infarction, unspecified: Secondary | ICD-10-CM

## 2020-10-25 DIAGNOSIS — I480 Paroxysmal atrial fibrillation: Secondary | ICD-10-CM

## 2020-10-25 DIAGNOSIS — I6529 Occlusion and stenosis of unspecified carotid artery: Secondary | ICD-10-CM

## 2020-11-06 ENCOUNTER — Other Ambulatory Visit: Payer: Self-pay

## 2020-11-06 ENCOUNTER — Ambulatory Visit (INDEPENDENT_AMBULATORY_CARE_PROVIDER_SITE_OTHER): Payer: Medicare Other | Admitting: Family Medicine

## 2020-11-06 ENCOUNTER — Encounter: Payer: Self-pay | Admitting: Family Medicine

## 2020-11-06 VITALS — BP 103/69 | HR 67 | Temp 98.4°F | Resp 16 | Ht 70.0 in | Wt 210.0 lb

## 2020-11-06 DIAGNOSIS — B351 Tinea unguium: Secondary | ICD-10-CM

## 2020-11-06 NOTE — Progress Notes (Signed)
Established patient visit   Patient: Richard Wiggins   DOB: 01/18/1951   70 y.o. Male  MRN: [id number] Visit Date: 11/06/2020  Today's healthcare provider: Vernie Murders, PA-C   Chief Complaint  Patient presents with  . Hypertension   Subjective    HPI  Hypertension, follow-up  BP Readings from Last 3 Encounters:  11/06/20 103/69  10/13/20 140/82  10/02/20 120/73   Wt Readings from Last 3 Encounters:  11/06/20 210 lb (95.3 kg)  10/13/20 213 lb (96.6 kg)  10/02/20 208 lb 8 oz (94.6 kg)     He was last seen for hypertension 3 months ago.  BP at that visit was 161/88. Management since that visit includes no changes.  He reports excellent compliance with treatment. He is not having side effects.  He is following a Regular diet. He is not exercising. He does not smoke.  Use of agents associated with hypertension: none.   Outside blood pressures are not being checked often. Symptoms: No chest pain No chest pressure  No palpitations No syncope  No dyspnea No orthopnea  No paroxysmal nocturnal dyspnea No lower extremity edema   Pertinent labs: Lab Results  Component Value Date   CHOL 116 05/27/2020   HDL 46.10 05/27/2020   LDLCALC 56 05/27/2020   TRIG 70.0 05/27/2020   CHOLHDL 3 05/27/2020   Lab Results  Component Value Date   NA 141 07/01/2020   K 4.0 07/01/2020   CREATININE 1.45 (H) 07/01/2020   GFRNONAA 52 (L) 07/01/2020   GFRAA >60 02/02/2020   GLUCOSE 66 (L) 07/01/2020     The ASCVD Risk score (Goff DC Jr., et al., 2013) failed to calculate for the following reasons:   The patient has a prior MI or stroke diagnosis   ---------------------------------------------------------------------------------------------------   Patient Active Problem List   Diagnosis Date Noted  . Positive colorectal cancer screening using Cologuard test 05/22/2020  . Goals of care, counseling/discussion 05/06/2020  . Malignant neoplasm of upper lobe of left lung  ([place]) 05/06/2020  . Preventative health care 03/26/2020  . Atherosclerosis of native arteries of extremity with intermittent claudication ([place]) 03/14/2020  . Trigger middle finger of right hand 02/20/2020  . Abdominal aortic aneurysm (AAA) without rupture ([place]) 02/20/2020  . 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 Marijuana use, continuous 02/10/2020  . Benign essential HTN 02/10/2020  . Pre-diabetes 02/10/2020  . BPH (benign prostatic hyperplasia) 02/09/2020  . Anemia 02/09/2020  . Weakness of lower extremity 02/01/2020  . COPD (chronic obstructive pulmonary disease) ([place]) 02/01/2020  . Chronic systolic CHF (congestive heart failure) ([place]) 02/01/2020  . Depression 02/01/2020  . GERD (gastroesophageal reflux disease) 02/01/2020  . A-fib ([place]) 02/01/2020  . CVA (cerebral vascular accident) ([place]) 02/01/2020  . CAD (coronary artery disease), native coronary artery 02/01/2020  . Carotid artery stenosis 02/01/2020  . HLD (hyperlipidemia) 02/01/2020  . Carotid stenosis, asymptomatic, left 02/14/2018  . S/P coronary artery stent placement 09/20/2017  . Centrilobular emphysema ([place]) 06/10/2017  . Chronic back pain 06/10/2017  . DOE (dyspnea on exertion) 06/10/2017  . Elevated TSH 06/10/2017  . Hematoma of groin 06/10/2017  . PAD (peripheral artery disease) ([place]) 06/10/2017  . Pulmonary embolism ([place]) 06/10/2017  . Tobacco abuse 06/10/2017  . Unintended weight loss 06/10/2017  . AAA (abdominal aortic aneurysm) ([place]) 06/10/2017  . PAF (paroxysmal atrial fibrillation) ([place]) 06/10/2017  . Systolic CHF, chronic ([place]) 06/10/2017  . Hypertension 05/31/2017  . Other hyperlipidemia 05/31/2017  . Hemiparesis affecting left side as  late effect of cerebrovascular accident ([place]) 05/29/2017  . Carotid artery stenosis, symptomatic, right 05/29/2017  . 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 Smoking status: Former Smoker     Packs/day: 2.00    Years: 53.00    Pack years: 106.00    Types: Cigarettes    Quit date: 05/28/2017    Years since quitting: 3.4  . Smokeless tobacco: Never Used  . Tobacco comment: Quit in 2018  Vaping Use  . Vaping Use: Former  . Quit date: 01/15/2020  . Devices: cbd   Substance Use Topics  . Alcohol use: Yes    Comment: socially drink cocktail  . Drug use: Yes    Types: Marijuana    Comment: last smoke x1 month ago   No Known Allergies     Medications: Outpatient Medications Prior to Visit  Medication Sig  . albuterol (PROVENTIL) (2.5 MG/3ML) 0.083% nebulizer solution Take 3 mLs (2.5 mg total) by nebulization every 6 (six) hours as needed for wheezing or shortness of breath.  [name] apixaban (ELIQUIS) 5 MG TABS tablet Take 1 tablet (5 mg total) by mouth in the morning and at bedtime.  [name] aspirin EC 81 MG tablet Take 81 mg by mouth daily. Swallow whole.  [name] atorvastatin (LIPITOR) 40 MG tablet TAKE 1 TABLET(40 MG) BY MOUTH AT BEDTIME  . Budeson-Glycopyrrol-Formoterol (BREZTRI AEROSPHERE) 160-9-4.8 MCG/ACT AERO Inhale 2 puffs into the lungs in the morning and at bedtime.  . carvedilol (COREG) 25 MG tablet Take 12.5 mg by mouth in the morning and at bedtime.  . furosemide (LASIX) 40 MG tablet Take 1 tablet (40 mg total) by mouth as needed.  . lamoTRIgine (LAMICTAL) 25 MG tablet Take 100 mg by mouth 2 (two) times daily. 1 q hs x 2 weeks then BID  . Lidocaine HCl-Benzyl Alcohol (SALONPAS LIDOCAINE PLUS EX) Place 1 patch onto the skin daily as needed (pain.).  [name] sacubitril-valsartan (ENTRESTO) 49-51 MG Take 0.5 tablets by mouth in the morning and at bedtime.  [name] spironolactone (ALDACTONE) 25 MG tablet Take 12.5 mg by mouth at bedtime.  . tamsulosin (FLOMAX) 0.4 MG CAPS capsule Take 1 capsule (0.4 mg total) by mouth daily.  . [DISCONTINUED] budesonide-formoterol (SYMBICORT) 160-4.5 MCG/ACT inhaler Inhale 2 puffs into the lungs 2 (two) times daily.  . [DISCONTINUED] predniSONE (STERAPRED  UNI-PAK 21 TAB) 10 MG (21) TBPK tablet Use as directed   No facility-administered medications prior to visit.    Review of Systems  Constitutional: Negative for appetite change, chills and fatigue.  HENT: Negative for congestion.   Respiratory: Positive for cough, shortness of breath and wheezing.   Cardiovascular: Negative for chest pain, palpitations and leg swelling.    Last CBC Lab Results  Component Value Date   WBC 4.6 05/27/2020   HGB 12.1 (L) 05/27/2020   HCT 36.4 (L) 05/27/2020   MCV 86.2 05/27/2020   MCH 28.6 02/01/2020   RDW 16.0 (H) 05/27/2020   PLT 159.0 67/34/1937   Last metabolic panel Lab Results  Component Value Date   GLUCOSE 66 (L) 07/01/2020   NA 141 07/01/2020   K 4.0 07/01/2020   CL 104 07/01/2020   CO2 29 07/01/2020   BUN 28 (H) 07/01/2020   CREATININE 1.45 (H) 07/01/2020   GFRNONAA 52 (L) 07/01/2020   GFRAA >60 02/02/2020   CALCIUM 9.5 07/01/2020   PROT 7.3 05/27/2020   ALBUMIN 4.2 05/27/2020   BILITOT 0.7 05/27/2020  ALKPHOS 131 (H) 05/27/2020   AST 15 05/27/2020   ALT 11 05/27/2020   ANIONGAP 8 07/01/2020   Last lipids Lab Results  Component Value Date   CHOL 116 05/27/2020   HDL 46.10 05/27/2020   LDLCALC 56 05/27/2020   TRIG 70.0 05/27/2020   CHOLHDL 3 05/27/2020   Last thyroid functions Lab Results  Component Value Date   TSH 2.454 02/01/2020        Objective    BP 103/69 (BP Location: Left Arm, Patient Position: Sitting, Cuff Size: Large)   Pulse 67   Temp 98.4 F (36.9 C) (Oral)   Resp 16   Ht 5\' 10"  (1.778 m)   Wt 210 lb (95.3 kg)   SpO2 98%   BMI 30.13 kg/m  BP Readings from Last 3 Encounters:  11/06/20 103/69  10/13/20 140/82  10/02/20 120/73   Wt Readings from Last 3 Encounters:  11/06/20 210 lb (95.3 kg)  10/13/20 213 lb (96.6 kg)  10/02/20 208 lb 8 oz (94.6 kg)      Physical Exam Constitutional:      General: He is not in acute distress.    Appearance: He is well-developed.  HENT:     Head:  Normocephalic and atraumatic.     Right Ear: Hearing normal.     Left Ear: Hearing normal.     Nose: Nose normal.  Eyes:     General: Lids are normal. No scleral icterus.       Right eye: No discharge.        Left eye: No discharge.     Conjunctiva/sclera: Conjunctivae normal.  Pulmonary:     Effort: Pulmonary effort is normal. No respiratory distress.  Musculoskeletal:        General: Normal range of motion.     Comments: Very thick and tender left great toenail. No erythema or discharge.  Skin:    Findings: No lesion or rash.  Neurological:     Mental Status: He is alert and oriented to person, place, and time.  Psychiatric:        Speech: Speech normal.        Behavior: Behavior normal.        Thought Content: Thought content normal.       No results found for any visits on 11/06/20.  Assessment & Plan     1. Onychomycosis of left great toe No improvement with OTC treatment regimens. Painful and more thickened left great toenail. Refer to podiatrist. - Ambulatory referral to Podiatry   No follow-ups on file.     I, [place], PA-C, have reviewed all documentation for this visit. The documentation on 11/06/20 for the exam, diagnosis, procedures, and orders are all accurate and complete.    Vernie Murders, PA-C  [employer] [employer] [phone] (phone) [phone] (fax)  [place]

## 2020-11-06 NOTE — Patient Instructions (Signed)
Fungal Nail Infection A fungal nail infection is a common infection of the toenails or fingernails. This condition affects toenails more often than fingernails. It often affects the great, or big, toes. More than one nail may be infected. The condition can be passed from person to person (is contagious). What are the causes? This condition is caused by a fungus. Several types of fungi can cause the infection. These fungi are common in moist and warm areas. If your hands or feet come into contact with the fungus, it may get into a crack in your fingernail or toenail and cause the infection. What increases the risk? The following factors may make you more likely to develop this condition:  Being male.  Being of older age.  Living with someone who has the fungus.  Walking barefoot in areas where the fungus thrives, such as showers or locker rooms.  Wearing shoes and socks that cause your feet to sweat.  Having a nail injury or a recent nail surgery.  Having certain medical conditions, such as: ? Athlete's foot. ? Diabetes. ? Psoriasis. ? Poor circulation. ? A weak body defense system (immune system). What are the signs or symptoms? Symptoms of this condition include:  A pale spot on the nail.  Thickening of the nail.  A nail that becomes yellow or brown.  A brittle or ragged nail edge.  A crumbling nail.  A nail that has lifted away from the nail bed.   How is this diagnosed? This condition is diagnosed with a physical exam. Your health care provider may take a scraping or clipping from your nail to test for the fungus. How is this treated? Treatment is not needed for mild infections. If you have significant nail changes, treatment may include:  Antifungal medicines taken by mouth (orally). You may need to take the medicine for several weeks or several months, and you may not see the results for a long time. These medicines can cause side effects. Ask your health care  provider what problems to watch for.  Antifungal nail polish or nail cream. These may be used along with oral antifungal medicines.  Laser treatment of the nail.  Surgery to remove the nail. This may be needed for the most severe infections. It can take a long time, usually up to a year, for the infection to go away. The infection may also come back.   Follow these instructions at home: Medicines  Take or apply over-the-counter and prescription medicines only as told by your health care provider.  Ask your health care provider about using over-the-counter mentholated ointment on your nails. Nail care  Trim your nails often.  Wash and dry your hands and feet every day.  Keep your feet dry: ? Wear absorbent socks, and change your socks frequently. ? Wear shoes that allow air to circulate, such as sandals or canvas tennis shoes. Throw out old shoes.  Do not use artificial nails.  If you go to a nail salon, make sure you choose one that uses clean instruments.  Use antifungal foot powder on your feet and in your shoes. General instructions  Do not share personal items, such as towels or nail clippers.  Do not walk barefoot in shower rooms or locker rooms.  Wear rubber gloves if you are working with your hands in wet areas.  Keep all follow-up visits as told by your health care provider. This is important. Contact a health care provider if: Your infection is not getting better or   it is getting worse after several months. Summary  A fungal nail infection is a common infection of the toenails or fingernails.  Treatment is not needed for mild infections. If you have significant nail changes, treatment may include taking medicine orally and applying medicine to your nails.  It can take a long time, usually up to a year, for the infection to go away. The infection may also come back.  Take or apply over-the-counter and prescription medicines only as told by your health care  provider.  Follow instructions for taking care of your nails to help prevent infection from coming back or spreading. This information is not intended to replace advice given to you by your health care provider. Make sure you discuss any questions you have with your health care provider. Document Revised: 10/19/2018 Document Reviewed: 12/02/2017 Elsevier Patient Education  2021 Elsevier Inc.  

## 2020-11-10 ENCOUNTER — Ambulatory Visit: Payer: Medicare Other | Admitting: Family Medicine

## 2020-11-11 DIAGNOSIS — R569 Unspecified convulsions: Secondary | ICD-10-CM | POA: Diagnosis not present

## 2020-11-11 DIAGNOSIS — Z8673 Personal history of transient ischemic attack (TIA), and cerebral infarction without residual deficits: Secondary | ICD-10-CM | POA: Diagnosis not present

## 2020-11-11 DIAGNOSIS — I69354 Hemiplegia and hemiparesis following cerebral infarction affecting left non-dominant side: Secondary | ICD-10-CM | POA: Diagnosis not present

## 2020-11-12 ENCOUNTER — Encounter: Payer: Self-pay | Admitting: Podiatry

## 2020-11-12 ENCOUNTER — Other Ambulatory Visit: Payer: Self-pay

## 2020-11-12 ENCOUNTER — Ambulatory Visit: Payer: Medicare Other | Admitting: Podiatry

## 2020-11-12 DIAGNOSIS — B351 Tinea unguium: Secondary | ICD-10-CM

## 2020-11-12 DIAGNOSIS — M79675 Pain in left toe(s): Secondary | ICD-10-CM | POA: Diagnosis not present

## 2020-11-12 DIAGNOSIS — M79674 Pain in right toe(s): Secondary | ICD-10-CM

## 2020-11-12 DIAGNOSIS — Z7901 Long term (current) use of anticoagulants: Secondary | ICD-10-CM | POA: Diagnosis not present

## 2020-11-12 NOTE — Progress Notes (Signed)
  Subjective:  Patient ID: Richard Wiggins, male    DOB: May 28, 1951,  MRN: [id number]  Chief Complaint  Patient presents with  . Nail Problem     NP - Onychomycosis of left great toe    70 y.o. male presents with the above complaint. History confirmed with patient.  He is also damaged the right great toe before Eliquis.  Objective:  Physical Exam: warm, good capillary refill, no trophic changes or ulcerative lesions, normal DP and PT pulses and normal sensory exam.  Both great toenails with onychomycosis and incurvated pincer nail borders Assessment:   1. Pain due to onychomycosis of toenails of both feet   2. Long term (current) use of anticoagulants      Plan:  Patient was evaluated and treated and all questions answered.  Discussed the etiology and treatment options for the condition in detail with the patient. Educated patient on the topical and oral treatment options for mycotic nails. Recommended debridement of the nails today. Sharp and mechanical debridement performed of all painful and mycotic nails today. Nails debrided in length and thickness using a nail nipper to level of comfort. Discussed treatment options including appropriate shoe gear. Follow up as needed for painful nails.   Do not think topical nail treatments will help much with this and with his Eliquis I would not recommend oral treatment  Return in about 3 months (around 02/12/2021) for painful nail fungus .

## 2020-11-16 ENCOUNTER — Other Ambulatory Visit: Payer: Self-pay | Admitting: Family Medicine

## 2020-11-16 DIAGNOSIS — I6529 Occlusion and stenosis of unspecified carotid artery: Secondary | ICD-10-CM

## 2020-11-16 DIAGNOSIS — I639 Cerebral infarction, unspecified: Secondary | ICD-10-CM

## 2020-11-16 DIAGNOSIS — I480 Paroxysmal atrial fibrillation: Secondary | ICD-10-CM

## 2020-11-16 NOTE — Telephone Encounter (Signed)
Requested medication (s) are due for refill today: yes  Requested medication (s) are on the active medication list: yes  Last refill:  08/23/20  Future visit scheduled: no  Notes to clinic:  no refill protocol for this med   Requested Prescriptions  Pending Prescriptions Disp Refills   atorvastatin (LIPITOR) 40 MG tablet [Pharmacy Med Name: ATORVASTATIN 40MG  TABLETS] 90 tablet 0    Sig: TAKE 1 TABLET(40 MG) BY MOUTH AT BEDTIME      There is no refill protocol information for this order

## 2020-11-19 ENCOUNTER — Other Ambulatory Visit: Payer: Self-pay | Admitting: Family Medicine

## 2020-11-19 DIAGNOSIS — I6529 Occlusion and stenosis of unspecified carotid artery: Secondary | ICD-10-CM

## 2020-11-19 DIAGNOSIS — I480 Paroxysmal atrial fibrillation: Secondary | ICD-10-CM

## 2020-11-19 DIAGNOSIS — I639 Cerebral infarction, unspecified: Secondary | ICD-10-CM

## 2020-11-19 MED ORDER — APIXABAN 5 MG PO TABS: 5.0000 mg | ORAL_TABLET | Freq: Two times a day (BID) | ORAL | 2 refills | Status: DC

## 2020-11-19 MED ORDER — TAMSULOSIN HCL 0.4 MG PO CAPS: 0.4000 mg | ORAL_CAPSULE | Freq: Every day | ORAL | 2 refills | Status: DC

## 2020-11-19 NOTE — Telephone Encounter (Signed)
Requested Prescriptions  Pending Prescriptions Disp Refills  . apixaban (ELIQUIS) 5 MG TABS tablet 90 tablet 2    Sig: Take 1 tablet (5 mg total) by mouth in the morning and at bedtime.     Hematology:  Anticoagulants Failed - 11/19/2020  3:46 PM      Failed - HGB in normal range and within 360 days    Hemoglobin  Date Value Ref Range Status  05/27/2020 12.1 (L) 13.0 - 17.0 g/dL Final         Failed - HCT in normal range and within 360 days    HCT  Date Value Ref Range Status  05/27/2020 36.4 (L) 39.0 - 52.0 % Final         Failed - Cr in normal range and within 360 days    Creatinine, Ser  Date Value Ref Range Status  07/01/2020 1.45 (H) 0.61 - 1.24 mg/dL Final         Passed - PLT in normal range and within 360 days    Platelets  Date Value Ref Range Status  05/27/2020 159.0 150.0 - 400.0 K/uL Final         Passed - Valid encounter within last 12 months    Recent Outpatient Visits          1 week ago Onychomycosis of left great toe   [place], Vickki Muff, PA-C   3 months ago Encounter to establish care with new doctor   [place], Avimor E, PA-C             . tamsulosin (FLOMAX) 0.4 MG CAPS capsule 90 capsule 2    Sig: Take 1 capsule (0.4 mg total) by mouth daily.     Urology: Alpha-Adrenergic Blocker Passed - 11/19/2020  3:46 PM      Passed - Last BP in normal range    BP Readings from Last 1 Encounters:  11/06/20 103/69         Passed - Valid encounter within last 12 months    Recent Outpatient Visits          1 week ago Onychomycosis of left great toe   [place], PA-C   3 months ago Encounter to establish care with new doctor   [place], [place]

## 2020-11-19 NOTE — Telephone Encounter (Signed)
Medication Refill - Medication: apixaban (ELIQUIS) 5 MG TABS tablet   tamsulosin (FLOMAX) 0.4 MG CAPS capsule   Has the patient contacted their pharmacy? Yes.   (Agent: If no, request that the patient contact the pharmacy for the refill.) (Agent: If yes, when and what did the pharmacy advise?)  Preferred Pharmacy (with phone number or street name):  [id number] - [place] [address], [place] [address]  [address] [place] [address]  Phone: [phone] Fax: [phone]     Agent: Please be advised that RX refills may take up to 3 business days. We ask that you follow-up with your pharmacy.

## 2020-12-15 ENCOUNTER — Ambulatory Visit: Payer: Medicare Other | Admitting: Cardiology

## 2020-12-22 ENCOUNTER — Other Ambulatory Visit: Payer: Self-pay | Admitting: *Deleted

## 2020-12-22 MED ORDER — FUROSEMIDE 40 MG PO TABS: 40.0000 mg | ORAL_TABLET | ORAL | 0 refills | Status: DC | PRN

## 2020-12-25 ENCOUNTER — Encounter: Payer: Self-pay | Admitting: Pulmonary Disease

## 2020-12-25 ENCOUNTER — Ambulatory Visit: Payer: Medicare Other | Admitting: Pulmonary Disease

## 2020-12-25 ENCOUNTER — Other Ambulatory Visit: Payer: Self-pay

## 2020-12-25 VITALS — BP 112/70 | HR 65 | Temp 98.2°F | Ht 70.0 in | Wt 200.4 lb

## 2020-12-25 DIAGNOSIS — J449 Chronic obstructive pulmonary disease, unspecified: Secondary | ICD-10-CM | POA: Diagnosis not present

## 2020-12-25 DIAGNOSIS — R0602 Shortness of breath: Secondary | ICD-10-CM | POA: Diagnosis not present

## 2020-12-25 DIAGNOSIS — I2510 Atherosclerotic heart disease of native coronary artery without angina pectoris: Secondary | ICD-10-CM

## 2020-12-25 DIAGNOSIS — R918 Other nonspecific abnormal finding of lung field: Secondary | ICD-10-CM | POA: Diagnosis not present

## 2020-12-25 NOTE — Progress Notes (Signed)
Subjective:    Patient ID: Richard Wiggins, male    DOB: [birth year], 70 y.o.   MRN: [id number] Chief Complaint  Patient presents with   Follow-up    Sob with exertion, wheezing and dry cough.     HPI Patient is a 70 year old former smoker (quit 2018) who presents for follow-up on the issue of cough and shortness of breath.  This is a scheduled visit.  We had previously evaluated the patient for a left upper lobe nodule.  He underwent SBRT for this nodule.  The patient is currently on Breztri 2 puffs twice a day for moderate COPD.  He gets his medications from the [place].  He does note that overall his shortness of breath and cough are better with the [facility].  He is just concerned because his shortness of breath continues to be an issue.  He does follow with cardiology.  Prior ambulatory oximetry's have not shown any desaturations.  He does have significant coronary artery disease.  There are also calcifications noted on the aortic valve on CT scan.   He has not had any orthopnea, paroxysmal nocturnal dyspnea or lower extremity edema.  Cough occurs perhaps only once a day mostly in the mornings no hemoptysis.  Productive of whitish sputum.  Dyspnea is with exertion anytime.  As noted Judithann Sauger has improved on the dyspnea issue but continues to be a problem.  Rest relieves his dyspnea.  No other complaints voiced today.   DATA: 02/01/2020 2D echo: LVEF 60 to 65%, mild ventricular hypertrophy, left atrial size dilated no valvular abnormalities.  No wall motion abnormalities. 02/26/2020 PFTs: FEV1 1.85 L or 56% predicted, FVC 3.19 L or 71% predicted, FEV1/FVC 58%, significant bronchodilator response.  There was hyperinflation and air trapping noted on lung volumes.  Diffusion capacity moderately to severely reduced.  Consistent with moderate COPD with chronic bronchitis and emphysema. 09/29/2020 CT chest: Nodule posterior aspect of the left lower lobe 2.0 x 1.2 x 1.2 cm, right upper lobe pulmonary  nodule 1.0 x 0.4 cm other scattered subcentimeter pulmonary nodules.  Bronchial wall thickening with mild centrilobular and paraseptal emphysema.  Significant coronary atherosclerosis noted.  Review of Systems A 10 point review of systems was performed and it is as noted above otherwise negative.  Patient Active Problem List   Diagnosis Date Noted   Long term (current) use of anticoagulants 11/12/2020   Positive colorectal cancer screening using Cologuard test 05/22/2020   Goals of care, counseling/discussion 05/06/2020   Malignant neoplasm of upper lobe of left lung ([place]) 05/06/2020   Preventative health care 03/26/2020   Atherosclerosis of native arteries of extremity with intermittent claudication ([place]) 03/14/2020   Trigger middle finger of right hand 02/20/2020   Abdominal aortic aneurysm (AAA) without rupture ([place]) 02/20/2020   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   Marijuana use, continuous 02/10/2020   Benign essential HTN 02/10/2020   Pre-diabetes 02/10/2020   BPH (benign prostatic hyperplasia) 02/09/2020   Anemia 02/09/2020   Weakness of lower extremity 02/01/2020   COPD (chronic obstructive pulmonary disease) ([place]) 54/27/0623   Chronic systolic CHF (congestive heart failure) ([place]) 02/01/2020   Depression 02/01/2020   GERD (gastroesophageal reflux disease) 02/01/2020   A-fib ([place]) 02/01/2020   CVA (cerebral vascular accident) ([place]) 02/01/2020   CAD (coronary artery disease), native coronary artery 02/01/2020   Carotid artery stenosis 02/01/2020   HLD (hyperlipidemia) 02/01/2020   Carotid stenosis, asymptomatic, left 02/14/2018   S/P coronary artery stent placement 09/20/2017   Centrilobular emphysema ([place]) 06/10/2017  Chronic back pain 06/10/2017   DOE (dyspnea on exertion) 06/10/2017   Elevated TSH 06/10/2017   Hematoma of groin 06/10/2017   PAD (peripheral artery disease) ([place]) 06/10/2017   Pulmonary embolism ([place]) 06/10/2017   Tobacco abuse 06/10/2017    Unintended weight loss 06/10/2017   AAA (abdominal aortic aneurysm) ([place]) 06/10/2017   PAF (paroxysmal atrial fibrillation) ([place]) 63/14/9702   Systolic CHF, chronic ([place]) 06/10/2017   Hypertension 05/31/2017   Other hyperlipidemia 05/31/2017   Hemiparesis affecting left side as late effect of cerebrovascular accident ([place]) 05/29/2017   Carotid artery stenosis, symptomatic, right 63/78/5885   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   apixaban (ELIQUIS) 5 MG TABS tablet Take 1 tablet (5 mg total) by mouth in the morning and at bedtime.   aspirin EC 81 MG tablet Take 81 mg by mouth daily. Swallow whole.   atorvastatin (LIPITOR) 40 MG tablet TAKE 1 TABLET(40 MG) BY MOUTH AT BEDTIME   Budeson-Glycopyrrol-Formoterol (BREZTRI AEROSPHERE) 160-9-4.8 MCG/ACT AERO Inhale 2 puffs into the lungs in the morning and at bedtime.   carvedilol (COREG) 25 MG tablet Take 12.5 mg by mouth in the morning and at bedtime.   furosemide (LASIX) 40 MG tablet Take 1 tablet (40 mg total) by mouth as needed.   lamoTRIgine (LAMICTAL) 25 MG tablet Take 100 mg by mouth 2 (two) times daily. 1 q hs x 2 weeks then BID   Lidocaine HCl-Benzyl Alcohol (SALONPAS LIDOCAINE PLUS EX) Place 1 patch onto the skin daily as needed (pain.).   Multiple Vitamin (MULTI-VITAMIN) tablet Take 1 tablet by mouth daily.   sacubitril-valsartan (ENTRESTO) 49-51  MG Take 0.5 tablets by mouth in the morning and at bedtime.   spironolactone (ALDACTONE) 25 MG tablet Take 12.5 mg by mouth at bedtime.   tamsulosin (FLOMAX) 0.4 MG CAPS capsule Take 1 capsule (0.4 mg total) by mouth daily.   Immunization History  Administered Date(s) Administered   Fluad Quad(high Dose 65+) 03/26/2020   Influenza,inj,Quad PF,6+ Mos 06/02/2017   Influenza-Unspecified 07/12/1997, 04/23/2019   Moderna Sars-Covid-2 Vaccination 09/12/2019, 10/10/2019, 04/20/2020   Pneumococcal Polysaccharide-23 06/02/2017   Tdap 02/09/2018   Zoster Recombinat (Shingrix) 05/21/2019, 10/19/2019       Objective:   Physical Exam BP 112/70 (BP Location: Left Arm, Cuff Size: Normal)   Pulse 65   Temp 98.2 F (36.8 C) (Temporal)   Ht 5\' 10"  (1.778 m)   Wt 200 lb 6.4 oz (90.9 kg)   SpO2 96%   BMI 28.75 kg/m   GENERAL: Awake, alert, fully ambulatory.  Flat affect.  No conversational dyspnea.  Mild tachypnea, no distress. HEAD: Normocephalic, atraumatic. EYES: Pupils equal, round, reactive to light.  No scleral icterus. MOUTH: Nose/mouth/throat not examined due to masking requirements for COVID 19. NECK: Supple. No thyromegaly. Trachea midline. No  JVD.  No adenopathy. PULMONARY: Mild tachypnea.  Good air entry bilaterally.  Coarse breath sounds, no other adventitious sounds.  CARDIOVASCULAR: S1 and S2. Regular rate and rhythm.  No rubs, murmurs or gallops heard. GASTROINTESTINAL: Protuberant abdomen, otherwise benign. MUSCULOSKELETAL: No joint deformity, no clubbing, no edema. NEUROLOGIC: No focal deficit, no gait disturbance, speech is fluent. SKIN: Intact,warm,dry.  Limited exam no rashes. PSYCH: Flat affect, calm and cooperative.      Assessment & Plan:     ICD-10-CM   1. Stage 2 moderate COPD by GOLD classification (HCC)  J44.9 Pulse oximetry, overnight    Pulmonary Function Test [facility] Only   Continue Breztri 2 puffs twice a day Continue as needed albuterol Repeat PFTs    2.  Multiple lung nodules  R91.8    These appear to be stable to slightly increased Status post SBRT on the LEFT upper lobe nodule Continued observation warranted    3. Shortness of breath  R06.02 ECHOCARDIOGRAM COMPLETE   Reevaluating with PFTs Reevaluate with 2D echo Overnight oximetry    4. Coronary artery disease involving native coronary artery of native heart without angina pectoris  I25.10    Significant coronary artery disease Query need for reassessment Follows with cardiology     Orders Placed This Encounter  Procedures   Pulse oximetry, overnight    On room,air DME:new start    Standing Status:   Future    Standing Expiration Date:   12/25/2021   Pulmonary Function Test [facility] Only    Standing Status:   Future    Standing Expiration Date:   12/25/2021    Scheduling Instructions:     80mo   ECHOCARDIOGRAM COMPLETE    Standing Status:   Future    Standing Expiration Date:   06/26/2021    Order Specific Question:   Where should this test be performed    Answer:   [facility]    Order Specific Question:   Perflutren DEFINITY (image enhancing agent) should be administered unless hypersensitivity or allergy exist    Answer:   Administer Perflutren    Order Specific Question:   Reason for exam-Echo    Answer:   Dyspnea  R06.00    Follow-up in 3 to 4 months time he is to contact us prior to that time should any new difficulties arise.  We will let him know the results of the testing as they become available.  Renold Don, MD [facility]   *This note was dictated using voice recognition software/Dragon.  Despite best efforts to proofread, errors can occur which can change the meaning.  Any change was purely unintentional.

## 2020-12-25 NOTE — Patient Instructions (Signed)
We will get breathing test repeated  We are getting an overnight oximetry (overnight oxygen level)  We will also recheck your heart with an echocardiogram  We will see you in follow-up in 3 to 4 months time  Continue using Breztri as you are doing

## 2020-12-30 ENCOUNTER — Telehealth: Payer: Self-pay | Admitting: Pulmonary Disease

## 2020-12-30 DIAGNOSIS — R059 Cough, unspecified: Secondary | ICD-10-CM

## 2020-12-30 MED ORDER — BREZTRI AEROSPHERE 160-9-4.8 MCG/ACT IN AERO: 2.0000 | INHALATION_SPRAY | Freq: Two times a day (BID) | RESPIRATORY_TRACT | 11 refills | Status: DC

## 2020-12-30 NOTE — Telephone Encounter (Signed)
Rx for Richard Wiggins has been sent to preferred pharmacy.  Patient's spouse, Richard Wiggins(DPR) is aware and voiced her understanding.  Nothing further needed.

## 2021-01-01 ENCOUNTER — Ambulatory Visit: Payer: Self-pay | Admitting: *Deleted

## 2021-01-01 DIAGNOSIS — J449 Chronic obstructive pulmonary disease, unspecified: Secondary | ICD-10-CM | POA: Diagnosis not present

## 2021-01-01 NOTE — Telephone Encounter (Signed)
Pt's wife called in he was with her c/o having an area on his right elbow that looks like it's getting infected.   He bruises easily due to being on Eliquis.   He bumped this place that was bruised 3 days ago and it's getting red and spreading.  He c/o it itching.   No pain.    I have sent in a request for an appt at [facility] due to no appts being available within the timeframe indicated on the protocol of 24 hours.  They were agreeable to having someone call them back for an appt.    Jobie Popp can be reached at [phone].

## 2021-01-01 NOTE — Telephone Encounter (Signed)
Advised patient's wife that next available appt is 01/22/21. Recommended urgent care to be seen. She verbally understands.

## 2021-01-01 NOTE — Telephone Encounter (Signed)
Reason for Disposition  [1] Red area or streak AND [2] no fever  Answer Assessment - Initial Assessment Questions 1. LOCATION: "Where is the wound located?"      Wife calling in but [employer] with her.    He bruises easily due to Elequis.   The other day he c/o the wound where he had bumped himself is red now.   No bleeding.   There is a wide red strick going down his arm. 2. WOUND APPEARANCE: "What does the wound look like?"       3. SIZE: If redness is present, ask: "What is the size of the red area?" (Inches, centimeters, or compare to size of a coin)      It's the size of a pencil eraser on right elbow. 4. SPREAD: "What's changed in the last day?"  "Do you see any red streaks coming from the wound?"     It's become red and is spreading down his arm. 5. ONSET: "When did it start to look infected?"      3 days ago 6. MECHANISM: "How did the wound start, what was the cause?"     He bumped a bruised area. 7. PAIN: "Is there any pain?" If Yes, ask: "How bad is the pain?"   (Scale 1-10; or mild, moderate, severe)     No pain but it's itching 8. FEVER: "Do you have a fever?" If Yes, ask: "What is your temperature, how was it measured, and when did it start?"     No 9. OTHER SYMPTOMS: "Do you have any other symptoms?" (e.g., shaking chills, weakness, rash elsewhere on body)     No 10. PREGNANCY: "Is there any chance you are pregnant?" "When was your last menstrual period?"       N/A  Protocols used: Wound Infection-A-AH

## 2021-01-02 ENCOUNTER — Ambulatory Visit
Admission: RE | Admit: 2021-01-02 | Discharge: 2021-01-02 | Disposition: A | Discharge status: Home / Self Care | Payer: Medicare Other | Source: Ambulatory Visit | Attending: Oncology | Admitting: Oncology

## 2021-01-02 ENCOUNTER — Other Ambulatory Visit: Payer: Self-pay

## 2021-01-02 DIAGNOSIS — I2510 Atherosclerotic heart disease of native coronary artery without angina pectoris: Secondary | ICD-10-CM | POA: Diagnosis not present

## 2021-01-02 DIAGNOSIS — Z85118 Personal history of other malignant neoplasm of bronchus and lung: Secondary | ICD-10-CM | POA: Insufficient documentation

## 2021-01-02 DIAGNOSIS — Z08 Encounter for follow-up examination after completed treatment for malignant neoplasm: Secondary | ICD-10-CM | POA: Diagnosis not present

## 2021-01-02 DIAGNOSIS — J432 Centrilobular emphysema: Secondary | ICD-10-CM | POA: Diagnosis not present

## 2021-01-02 DIAGNOSIS — I714 Abdominal aortic aneurysm, without rupture: Secondary | ICD-10-CM | POA: Diagnosis not present

## 2021-01-02 DIAGNOSIS — C3412 Malignant neoplasm of upper lobe, left bronchus or lung: Secondary | ICD-10-CM | POA: Insufficient documentation

## 2021-01-02 DIAGNOSIS — L03113 Cellulitis of right upper limb: Secondary | ICD-10-CM | POA: Diagnosis not present

## 2021-01-05 ENCOUNTER — Other Ambulatory Visit: Payer: Self-pay

## 2021-01-05 ENCOUNTER — Inpatient Hospital Stay: Payer: Medicare Other

## 2021-01-05 ENCOUNTER — Inpatient Hospital Stay: Payer: Medicare Other | Attending: Oncology | Admitting: Oncology

## 2021-01-05 VITALS — BP 110/73 | HR 73 | Temp 98.2°F | Resp 20 | Wt 200.7 lb

## 2021-01-05 DIAGNOSIS — C3490 Malignant neoplasm of unspecified part of unspecified bronchus or lung: Secondary | ICD-10-CM | POA: Diagnosis not present

## 2021-01-05 DIAGNOSIS — D509 Iron deficiency anemia, unspecified: Secondary | ICD-10-CM | POA: Diagnosis not present

## 2021-01-05 DIAGNOSIS — I2510 Atherosclerotic heart disease of native coronary artery without angina pectoris: Secondary | ICD-10-CM | POA: Insufficient documentation

## 2021-01-05 DIAGNOSIS — C3412 Malignant neoplasm of upper lobe, left bronchus or lung: Secondary | ICD-10-CM | POA: Diagnosis not present

## 2021-01-05 DIAGNOSIS — I480 Paroxysmal atrial fibrillation: Secondary | ICD-10-CM | POA: Insufficient documentation

## 2021-01-05 DIAGNOSIS — N189 Chronic kidney disease, unspecified: Secondary | ICD-10-CM | POA: Diagnosis not present

## 2021-01-05 DIAGNOSIS — I130 Hypertensive heart and chronic kidney disease with heart failure and stage 1 through stage 4 chronic kidney disease, or unspecified chronic kidney disease: Secondary | ICD-10-CM | POA: Insufficient documentation

## 2021-01-05 LAB — CBC WITH DIFFERENTIAL/PLATELET
Abs Immature Granulocytes: 0.02 10*3/uL (ref 0.00–0.07)
Basophils Absolute: 0 10*3/uL (ref 0.0–0.1)
Basophils Relative: 1 %
Eosinophils Absolute: 0.1 10*3/uL (ref 0.0–0.5)
Eosinophils Relative: 3 %
HCT: 28 % — ABNORMAL LOW (ref 39.0–52.0)
Hemoglobin: 9 g/dL — ABNORMAL LOW (ref 13.0–17.0)
Immature Granulocytes: 1 %
Lymphocytes Relative: 18 %
Lymphs Abs: 0.8 10*3/uL (ref 0.7–4.0)
MCH: 30.5 pg (ref 26.0–34.0)
MCHC: 32.1 g/dL (ref 30.0–36.0)
MCV: 94.9 fL (ref 80.0–100.0)
Monocytes Absolute: 0.6 10*3/uL (ref 0.1–1.0)
Monocytes Relative: 13 %
Neutro Abs: 2.8 10*3/uL (ref 1.7–7.7)
Neutrophils Relative %: 64 %
Platelets: 144 10*3/uL — ABNORMAL LOW (ref 150–400)
RBC: 2.95 MIL/uL — ABNORMAL LOW (ref 4.22–5.81)
RDW: 17 % — ABNORMAL HIGH (ref 11.5–15.5)
WBC: 4.3 10*3/uL (ref 4.0–10.5)
nRBC: 0 % (ref 0.0–0.2)

## 2021-01-05 LAB — IRON AND TIBC
Iron: 61 ug/dL (ref 45–182)
Saturation Ratios: 18 % (ref 17.9–39.5)
TIBC: 337 ug/dL (ref 250–450)
UIBC: 276 ug/dL

## 2021-01-05 LAB — FERRITIN: Ferritin: 90 ng/mL (ref 24–336)

## 2021-01-05 LAB — RETIC PANEL
Immature Retic Fract: 20.8 % — ABNORMAL HIGH (ref 2.3–15.9)
RBC.: 2.95 MIL/uL — ABNORMAL LOW (ref 4.22–5.81)
Retic Count, Absolute: 65.5 10*3/uL (ref 19.0–186.0)
Retic Ct Pct: 2.2 % (ref 0.4–3.1)
Reticulocyte Hemoglobin: 32.5 pg (ref >27.9)

## 2021-01-05 LAB — TSH: TSH: 5.915 u[IU]/mL — ABNORMAL HIGH (ref 0.350–4.500)

## 2021-01-05 LAB — FOLATE: Folate: 28 ng/mL (ref >5.9)

## 2021-01-05 NOTE — Progress Notes (Signed)
Hematology/Oncology Consult note [facility]  Telephone:([phone] Fax:([phone]  Patient Care Team: Chrismon, Vickki Muff, PA-C as PCP - General (Family Medicine) Telford Nab, RN as Oncology Nurse Navigator Noreene Filbert, MD as Referring Physician (Radiation Oncology) Sindy Guadeloupe, MD as Consulting Physician (Oncology)   Name of the patient: Richard Wiggins  [id number]  09-06-1950   Date of visit: 01/05/21  Diagnosis- stage I lung cancer s/p SBRT    Chief complaint/ Reason for visit-discuss CT scan results and further management  Heme/Onc history: Patient is a 70 year old male who was a former smoker and quit in 2018.  He went to the ER in July 2021 for possible strokelike symptoms and was found to have a left upper lobe lung nodule which was followed up with a CT chest which showed a 1.2 x 1.1 cm left upper lobe spiculated nodule.  This was followed by a PET scan which showed that nodule was hypermetabolic with an SUV of 1.44 patient was also incidentally noted to have a 4.3 cm abdominal aortic aneurysm.  He follows up with Dr. Lucky Cowboy for his aneurysm.  He was seen by Dr. Patsey Berthold and underwent ENB.  Biopsy was nondiagnostic.  However given that the nodule was hypermetabolic it was concerning for malignancy and recommendation per Dr. Patsey Berthold was empiric radiation. Patient underwent SBRT to his left upper lobe by Dr. Baruch Gouty   Patient also has a positive fit test.  Colonoscopy in January 2022 showed no evidence of colon cancer.  There wereCouple of polyps which were resected and were consistent with tubular adenoma  Interval history-patient reports doing well overall and feels at his baseline state of health.  Denies any recent hospitalizations.  Appetite and weight have remained stable.  ECOG PS- 1 Pain scale- 0   Review of systems- Review of Systems  Constitutional:  Positive for malaise/fatigue. Negative for chills, fever and weight loss.   HENT:  Negative for congestion, ear discharge and nosebleeds.   Eyes:  Negative for blurred vision.  Respiratory:  Negative for cough, hemoptysis, sputum production, shortness of breath and wheezing.   Cardiovascular:  Negative for chest pain, palpitations, orthopnea and claudication.  Gastrointestinal:  Negative for abdominal pain, blood in stool, constipation, diarrhea, heartburn, melena, nausea and vomiting.  Genitourinary:  Negative for dysuria, flank pain, frequency, hematuria and urgency.  Musculoskeletal:  Negative for back pain, joint pain and myalgias.  Skin:  Negative for rash.  Neurological:  Negative for dizziness, tingling, focal weakness, seizures, weakness and headaches.  Endo/Heme/Allergies:  Does not bruise/bleed easily.  Psychiatric/Behavioral:  Negative for depression and suicidal ideas. The patient does not have insomnia.     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 -plan medical managmment with tight bp control   Anemia 02/09/2020   Anxiety    Benign essential HTN 02/10/2020   BPH (benign prostatic hyperplasia) 02/09/2020   Brain aneurysm    Cancer (HCC)    CHF (congestive heart failure) (HCC)    Chronic kidney disease    COPD (chronic obstructive pulmonary disease) (HCC)    Coronary artery disease    Depression    Dysrhythmia    Emphysema of lung (HCC)    History of common carotid artery stent placement    Hypertension    IDA (iron deficiency anemia) 02/09/2020   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   Marijuana use, continuous  02/10/2020   Osteoporosis    PAF (paroxysmal atrial fibrillation) (HCC)    Pulmonary emboli ([place]) 06/07/2017   Right lower lobe pulmonary embolism small segmental, multifocal multifocal pneumonia, mediastinal lymphadenopathy, moderate centrilobular emphysema   Seizures (HCC)    Stroke ([place])    left sided weakness to arm and leg      Past Surgical History:  Procedure Laterality Date   brain aneurysm with clip     COLONOSCOPY WITH PROPOFOL N/A 07/23/2020   Procedure: COLONOSCOPY WITH PROPOFOL;  Surgeon: Lin Landsman, MD;  Location: [facility] ENDOSCOPY;  Service: Gastroenterology;  Laterality: N/A;   CORONARY ANGIOPLASTY WITH STENT PLACEMENT  07/08/2017   hip repleacement Right    Right total hip arthroplasty   VIDEO BRONCHOSCOPY WITH ENDOBRONCHIAL NAVIGATION N/A 04/23/2020   Procedure: VIDEO BRONCHOSCOPY WITH ENDOBRONCHIAL NAVIGATION;  Surgeon: Tyler Pita, MD;  Location: [facility] ORS;  Service: Pulmonary;  Laterality: N/A;    Social History   Socioeconomic History   Marital status: Married    Spouse name: Not on file   Number of children: Not on file   Years of education: Not on file   Highest education level: High school graduate  Occupational History   Occupation: Retired  Tobacco Use   Smoking status: Former    Packs/day: 2.00    Years: 53.00    Pack years: 106.00    Types: Cigarettes    Quit date: 05/28/2017    Years since quitting: 3.6   Smokeless tobacco: Never   Tobacco comments:    Quit in 2018  Vaping Use   Vaping Use: Former   Quit date: 01/15/2020   Devices: cbd   Substance and Sexual Activity   Alcohol use: Yes    Comment: socially drink cocktail   Drug use: Yes    Types: Marijuana    Comment: last smoke x1 month ago   Sexual activity: Not Currently  Other Topics Concern   Not on file  Social History Narrative   Lives with wife. 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 Children x2 children and grandchildren grown.    Social Determinants of Health   Financial Resource Strain: Not on file  Food Insecurity: Not on file  Transportation Needs: Not on file  Physical Activity: Not on file  Stress: Not on file  Social Connections: Not on file  Intimate Partner Violence: Not on file    Family History  Problem Relation Age of Onset   Diabetes Mother    Heart disease  Mother    Stroke Father    Heart disease Father    Heart attack Father    Alcohol abuse Father    Heart disease Sister    Heart attack Sister    Heart disease Maternal Grandmother    Heart attack Maternal Grandmother    Heart attack Paternal Grandmother    Diabetes Brother    Heart disease Brother    Heart attack Brother      Current Outpatient Medications:    albuterol (PROVENTIL) (2.5 MG/3ML) 0.083% nebulizer solution, Take 3 mLs (2.5 mg total) by nebulization every 6 (six) hours as needed for wheezing or shortness of breath., Disp: 75 mL, Rfl: 6   apixaban (ELIQUIS) 5 MG TABS tablet, Take 1 tablet (5 mg total) by mouth in the morning and at bedtime., Disp: 90 tablet, Rfl: 2   Budeson-Glycopyrrol-Formoterol (BREZTRI AEROSPHERE) 160-9-4.8 MCG/ACT AERO, Inhale 2 puffs into the lungs in the morning and at bedtime., Disp: 10.7 g, Rfl: 11  carvedilol (COREG) 25 MG tablet, Take 12.5 mg by mouth in the morning and at bedtime., Disp: , Rfl:    cephALEXin (KEFLEX) 250 MG capsule, Take 250 mg by mouth 2 (two) times daily., Disp: , Rfl:    lamoTRIgine (LAMICTAL) 100 MG tablet, Take by mouth., Disp: , Rfl:    Lidocaine HCl-Benzyl Alcohol (SALONPAS LIDOCAINE PLUS EX), Place 1 patch onto the skin daily as needed (pain.)., Disp: , Rfl:    Multiple Vitamin (MULTI-VITAMIN) tablet, Take 1 tablet by mouth daily., Disp: , Rfl:    sacubitril-valsartan (ENTRESTO) 49-51 MG, Take 0.5 tablets by mouth in the morning and at bedtime., Disp: , Rfl:    spironolactone (ALDACTONE) 25 MG tablet, Take 12.5 mg by mouth at bedtime., Disp: , Rfl:    tamsulosin (FLOMAX) 0.4 MG CAPS capsule, Take 1 capsule (0.4 mg total) by mouth daily., Disp: 90 capsule, Rfl: 2   aspirin EC 81 MG tablet, Take 81 mg by mouth daily. Swallow whole., Disp: , Rfl:    atorvastatin (LIPITOR) 40 MG tablet, TAKE 1 TABLET(40 MG) BY MOUTH AT BEDTIME, Disp: 90 tablet, Rfl: 3   furosemide (LASIX) 40 MG tablet, Take 1 tablet (40 mg total) by mouth as  needed. (Patient not taking: Reported on 01/05/2021), Disp: 30 tablet, Rfl: 0   lamoTRIgine (LAMICTAL) 25 MG tablet, Take 100 mg by mouth 2 (two) times daily. 1 q hs x 2 weeks then BID (Patient not taking: Reported on 01/05/2021), Disp: , Rfl:   Physical exam:  Vitals:   01/05/21 1032  BP: 110/73  Pulse: 73  Resp: 20  Temp: 98.2 F (36.8 C)  SpO2: 100%  Weight: 200 lb 11.2 oz (91 kg)   Physical Exam Constitutional:      General: He is not in acute distress. Cardiovascular:     Rate and Rhythm: Normal rate and regular rhythm.     Heart sounds: Normal heart sounds.  Pulmonary:     Effort: Pulmonary effort is normal.     Breath sounds: Normal breath sounds.  Abdominal:     General: Bowel sounds are normal.     Palpations: Abdomen is soft.  Skin:    General: Skin is warm and dry.  Neurological:     Mental Status: He is alert and oriented to person, place, and time.     CMP Latest Ref Rng & Units 07/01/2020  Glucose 70 - 99 mg/dL 66(L)  BUN 8 - 23 mg/dL 28(H)  Creatinine 0.61 - 1.24 mg/dL 1.45(H)  Sodium 135 - 145 mmol/L 141  Potassium 3.5 - 5.1 mmol/L 4.0  Chloride 98 - 111 mmol/L 104  CO2 22 - 32 mmol/L 29  Calcium 8.9 - 10.3 mg/dL 9.5  Total Protein 6.0 - 8.3 g/dL -  Total Bilirubin 0.2 - 1.2 mg/dL -  Alkaline Phos 39 - 117 U/L -  AST 0 - 37 U/L -  ALT 0 - 53 U/L -   CBC Latest Ref Rng & Units 01/05/2021  WBC 4.0 - 10.5 K/uL 4.3  Hemoglobin 13.0 - 17.0 g/dL 9.0(L)  Hematocrit 39.0 - 52.0 % 28.0(L)  Platelets 150 - 400 K/uL 144(L)    No images are attached to the encounter.  CT Chest Wo Contrast  Result Date: 01/04/2021 CLINICAL DATA:  70 year old male with non-small cell lung cancer of the left upper lobe. Staging. EXAM: CT CHEST WITHOUT CONTRAST TECHNIQUE: Multidetector CT imaging of the chest was performed following the standard protocol without IV contrast. COMPARISON:  Chest CT  dated 09/29/2020. FINDINGS: Evaluation of this exam is limited in the absence of  intravenous contrast. Cardiovascular: There is no cardiomegaly or pericardial effusion. There is advanced 3 vessel coronary vascular calcification. There is mild atherosclerotic calcification of the thoracic aorta. No aneurysmal dilatation. Partially visualized infrarenal abdominal aortic aneurysm measuring approximately 3.7 cm in diameter. CT of the abdomen pelvis with IV contrast recommended for better evaluation of the abdominal aorta. The central pulmonary arteries are unremarkable. Mediastinum/Nodes: No hilar adenopathy. Evaluation however is limited in the absence of intravenous contrast. Mildly enlarged lymph node measuring approximately 12 mm short axis posterior to the left main pulmonary artery (66/2) previously measured 10 mm short axis. The esophagus and the thyroid gland are grossly unremarkable. No mediastinal fluid collection. Lungs/Pleura: Background of centrilobular emphysema. There is an 11 mm nodule in the left upper lobe along the fissure (42/3), unchanged since the prior CT. There is a 6 mm nodule in the right upper lobe (34/3), unchanged. A 7 mm right lower lobe nodule (101/3), unchanged. No focal consolidation, pleural effusion, or pneumothorax. The central airways are patent. Upper Abdomen: Similar appearance of an 18 mm hypodense lesion from the upper pole of the left kidney as well as a smaller hypodense lesion arising from the upper pole of the right kidney. Musculoskeletal: Osteopenia with degenerative changes of the spine. No acute osseous pathology. IMPRESSION: 1. No acute intrathoracic pathology. No significant interval change in the size or appearance of the bilateral pulmonary nodules as described above. No new nodules. 2. Mildly enlarged mediastinal lymph node, slightly increased in size since the prior CT. 3. Partially visualized infrarenal abdominal aortic aneurysm measuring approximately 3.7 cm in diameter. 4. Aortic Atherosclerosis (ICD10-I70.0) and Emphysema (ICD10-J43.9).  Electronically Signed   By: Anner Crete M.D.   On: 01/04/2021 02:24     Assessment and plan- Patient is a 70 y.o. male with history of stage I presumed lung cancer s/p SBRT here to discuss CT scan results and further management  Overall left upper lobe nodule isI have reviewed CT chest images independently and discussed findings with the patient.  Stable.  6 mm right upper lobe nodule also unchanged.  He has other bilateral subcentimeter nodules which have also remained stable.  He has a known abdominal aortic aneurysm which is being followed by vascular surgery and I will therefore hold off on doing a CT abdomen at this time.  Patient also noted to have extensive coronary artery calcifications.  He will be undergoing an echocardiogram in August 2022 and also follows up with cardiology.  I will see him back in 6 months with a CT chest without contrast prior   Visit Diagnosis 1. Malignant neoplasm of upper lobe of left lung ([place])   2. Malignant neoplasm of unspecified part of unspecified bronchus or lung ([place])      Dr. Randa Evens, MD, MPH [facility] [phone] 01/05/2021 4:18 PM

## 2021-01-27 ENCOUNTER — Ambulatory Visit: Payer: Self-pay | Admitting: *Deleted

## 2021-01-27 ENCOUNTER — Other Ambulatory Visit: Payer: Self-pay

## 2021-01-27 ENCOUNTER — Emergency Department: Payer: No Typology Code available for payment source

## 2021-01-27 ENCOUNTER — Emergency Department
Admission: EM | Admit: 2021-01-27 | Discharge: 2021-01-27 | Disposition: A | Discharge status: Home / Self Care | Payer: No Typology Code available for payment source | Attending: Emergency Medicine | Admitting: Emergency Medicine

## 2021-01-27 ENCOUNTER — Encounter: Payer: Self-pay | Admitting: Emergency Medicine

## 2021-01-27 ENCOUNTER — Ambulatory Visit
Admission: EM | Admit: 2021-01-27 | Discharge: 2021-01-27 | Disposition: A | Discharge status: Home / Self Care | Payer: Medicare Other

## 2021-01-27 DIAGNOSIS — Z7901 Long term (current) use of anticoagulants: Secondary | ICD-10-CM | POA: Diagnosis not present

## 2021-01-27 DIAGNOSIS — D649 Anemia, unspecified: Secondary | ICD-10-CM | POA: Diagnosis not present

## 2021-01-27 DIAGNOSIS — M79604 Pain in right leg: Secondary | ICD-10-CM | POA: Diagnosis present

## 2021-01-27 DIAGNOSIS — I130 Hypertensive heart and chronic kidney disease with heart failure and stage 1 through stage 4 chronic kidney disease, or unspecified chronic kidney disease: Secondary | ICD-10-CM | POA: Diagnosis not present

## 2021-01-27 DIAGNOSIS — M7989 Other specified soft tissue disorders: Secondary | ICD-10-CM | POA: Diagnosis not present

## 2021-01-27 DIAGNOSIS — I739 Peripheral vascular disease, unspecified: Secondary | ICD-10-CM | POA: Insufficient documentation

## 2021-01-27 DIAGNOSIS — J449 Chronic obstructive pulmonary disease, unspecified: Secondary | ICD-10-CM | POA: Diagnosis not present

## 2021-01-27 DIAGNOSIS — Z87891 Personal history of nicotine dependence: Secondary | ICD-10-CM | POA: Insufficient documentation

## 2021-01-27 DIAGNOSIS — I480 Paroxysmal atrial fibrillation: Secondary | ICD-10-CM | POA: Insufficient documentation

## 2021-01-27 DIAGNOSIS — Z79899 Other long term (current) drug therapy: Secondary | ICD-10-CM | POA: Diagnosis not present

## 2021-01-27 DIAGNOSIS — N189 Chronic kidney disease, unspecified: Secondary | ICD-10-CM | POA: Insufficient documentation

## 2021-01-27 DIAGNOSIS — M7981 Nontraumatic hematoma of soft tissue: Secondary | ICD-10-CM | POA: Insufficient documentation

## 2021-01-27 DIAGNOSIS — I2510 Atherosclerotic heart disease of native coronary artery without angina pectoris: Secondary | ICD-10-CM | POA: Diagnosis not present

## 2021-01-27 DIAGNOSIS — T148XXA Other injury of unspecified body region, initial encounter: Secondary | ICD-10-CM

## 2021-01-27 DIAGNOSIS — Z96641 Presence of right artificial hip joint: Secondary | ICD-10-CM | POA: Diagnosis not present

## 2021-01-27 DIAGNOSIS — I5022 Chronic systolic (congestive) heart failure: Secondary | ICD-10-CM | POA: Diagnosis not present

## 2021-01-27 LAB — BASIC METABOLIC PANEL
Anion gap: 7 (ref 5–15)
BUN: 22 mg/dL (ref 8–23)
CO2: 25 mmol/L (ref 22–32)
Calcium: 9 mg/dL (ref 8.9–10.3)
Chloride: 103 mmol/L (ref 98–111)
Creatinine, Ser: 1.44 mg/dL — ABNORMAL HIGH (ref 0.61–1.24)
GFR, Estimated: 52 mL/min — ABNORMAL LOW (ref >60)
Glucose, Bld: 98 mg/dL (ref 70–99)
Potassium: 4.7 mmol/L (ref 3.5–5.1)
Sodium: 135 mmol/L (ref 135–145)

## 2021-01-27 LAB — CBC WITH DIFFERENTIAL/PLATELET
Abs Immature Granulocytes: 0.02 10*3/uL (ref 0.00–0.07)
Basophils Absolute: 0 10*3/uL (ref 0.0–0.1)
Basophils Relative: 0 %
Eosinophils Absolute: 0.1 10*3/uL (ref 0.0–0.5)
Eosinophils Relative: 3 %
HCT: 23.2 % — ABNORMAL LOW (ref 39.0–52.0)
Hemoglobin: 7.5 g/dL — ABNORMAL LOW (ref 13.0–17.0)
Immature Granulocytes: 1 %
Lymphocytes Relative: 18 %
Lymphs Abs: 0.7 10*3/uL (ref 0.7–4.0)
MCH: 31.4 pg (ref 26.0–34.0)
MCHC: 32.3 g/dL (ref 30.0–36.0)
MCV: 97.1 fL (ref 80.0–100.0)
Monocytes Absolute: 0.3 10*3/uL (ref 0.1–1.0)
Monocytes Relative: 8 %
Neutro Abs: 2.8 10*3/uL (ref 1.7–7.7)
Neutrophils Relative %: 70 %
Platelets: 142 10*3/uL — ABNORMAL LOW (ref 150–400)
RBC: 2.39 MIL/uL — ABNORMAL LOW (ref 4.22–5.81)
RDW: 15.9 % — ABNORMAL HIGH (ref 11.5–15.5)
WBC: 3.9 10*3/uL — ABNORMAL LOW (ref 4.0–10.5)
nRBC: 0 % (ref 0.0–0.2)

## 2021-01-27 MED ORDER — IOHEXOL 350 MG/ML SOLN
125.0000 mL | Freq: Once | INTRAVENOUS | Status: AC | PRN
  Administered 2021-01-27: 125 mL via INTRAVENOUS
  Filled 2021-01-27: qty 125

## 2021-01-27 NOTE — ED Provider Notes (Signed)
[facility] Emergency Department Provider Note ____________________________________________   Event Date/Time   First MD Initiated Contact with Patient 01/27/21 1518     (approximate)  I have reviewed the triage vital signs and the nursing notes.   HISTORY  Chief Complaint Leg Pain  HPI Richard Wiggins is a 70 y.o. male with complex medical history as listed below presents to the emergency department for treatment and evaluation of nontraumatic right calf pain and swelling that seems to be increasing in size.  He first noticed the size difference last week, but has had pain with ambulation for over a month. Wife states that she has also noticed that his foot and ankle seem to be noticeably cooler on the right than the left today.      Past Medical History:  Diagnosis Date   AAA (abdominal aortic aneurysm) ([place]) 2020   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 -plan medical managmment with tight bp control   Anemia 02/09/2020   Anxiety    Benign essential HTN 02/10/2020   BPH (benign prostatic hyperplasia) 02/09/2020   Brain aneurysm    Cancer (HCC)    CHF (congestive heart failure) (HCC)    Chronic kidney disease    COPD (chronic obstructive pulmonary disease) (HCC)    Coronary artery disease    Depression    Dysrhythmia    Emphysema of lung ([place])    History of common carotid artery stent placement    Hypertension    IDA (iron deficiency anemia) 02/09/2020   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   Marijuana use, continuous 02/10/2020   Osteoporosis    PAF (paroxysmal atrial fibrillation) ([place])    Pulmonary emboli ([place]) 06/07/2017   Right lower lobe pulmonary embolism small segmental, multifocal multifocal pneumonia, mediastinal lymphadenopathy, moderate centrilobular emphysema   Seizures (HCC)    Stroke (HCC)    left sided weakness to arm and leg    Patient Active Problem List   Diagnosis Date Noted   Long term (current) use of anticoagulants 11/12/2020    Positive colorectal cancer screening using Cologuard test 05/22/2020   Goals of care, counseling/discussion 05/06/2020   Malignant neoplasm of upper lobe of left lung ([place]) 05/06/2020   Preventative health care 03/26/2020   Atherosclerosis of native arteries of extremity with intermittent claudication ([place]) 03/14/2020   Trigger middle finger of right hand 02/20/2020   Abdominal aortic aneurysm (AAA) without rupture ([place]) 02/20/2020   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   Marijuana use, continuous 02/10/2020   Benign essential HTN 02/10/2020   Pre-diabetes 02/10/2020   BPH (benign prostatic hyperplasia) 02/09/2020   Anemia 02/09/2020   Weakness of lower extremity 02/01/2020   COPD (chronic obstructive pulmonary disease) ([place]) 38/04/1750   Chronic systolic CHF (congestive heart failure) ([place]) 02/01/2020   Depression 02/01/2020   GERD (gastroesophageal reflux disease) 02/01/2020   A-fib ([place]) 02/01/2020   CVA (cerebral vascular accident) ([place]) 02/01/2020   CAD (coronary artery disease), native coronary artery 02/01/2020   Carotid artery stenosis 02/01/2020   HLD (hyperlipidemia) 02/01/2020   Carotid stenosis, asymptomatic, left 02/14/2018   S/P coronary artery stent placement 09/20/2017   Centrilobular emphysema ([place]) 06/10/2017   Chronic back pain 06/10/2017   DOE (dyspnea on exertion) 06/10/2017   Elevated TSH 06/10/2017   Hematoma of groin 06/10/2017   PAD (peripheral artery disease) ([place]) 06/10/2017   Pulmonary embolism ([place]) 06/10/2017   Tobacco abuse 06/10/2017   Unintended weight loss 06/10/2017   AAA (abdominal aortic aneurysm) ([place]) 06/10/2017   PAF (  paroxysmal atrial fibrillation) ([place]) 09/32/6712   Systolic CHF, chronic ([place]) 06/10/2017   Hypertension 05/31/2017   Other hyperlipidemia 05/31/2017   Hemiparesis affecting left side as late effect of cerebrovascular accident ([place]) 05/29/2017   Carotid artery stenosis, symptomatic, right 45/80/9983   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 ([place]) 05/28/2017    Past Surgical History:  Procedure Laterality Date   brain aneurysm with clip     COLONOSCOPY WITH PROPOFOL N/A 07/23/2020   Procedure: COLONOSCOPY WITH PROPOFOL;  Surgeon: Lin Landsman, MD;  Location: [place] [facility];  Service: Gastroenterology;  Laterality: N/A;   CORONARY ANGIOPLASTY WITH STENT PLACEMENT  07/08/2017   hip repleacement Right    Right total hip arthroplasty   VIDEO BRONCHOSCOPY WITH ENDOBRONCHIAL NAVIGATION N/A 04/23/2020   Procedure: VIDEO BRONCHOSCOPY WITH ENDOBRONCHIAL NAVIGATION;  Surgeon: Tyler Pita, MD;  Location: [facility];  Service: Pulmonary;  Laterality: N/A;    Prior to Admission medications   Medication Sig Start Date End Date Taking? Authorizing Provider  albuterol (PROVENTIL) (2.5 MG/3ML) 0.083% nebulizer solution Take 3 mLs (2.5 mg total) by nebulization every 6 (six) hours as needed for wheezing or shortness of breath. 10/13/20   Tyler Pita, MD  apixaban (ELIQUIS) 5 MG TABS tablet Take 1 tablet (5 mg total) by mouth in the morning and at bedtime. 11/19/20   Chrismon, Vickki Muff, PA-C  aspirin EC 81 MG tablet Take 81 mg by mouth daily. Swallow whole.    [provider]  atorvastatin (LIPITOR) 40 MG tablet TAKE 1 TABLET(40 MG) BY MOUTH AT BEDTIME 11/17/20   Chrismon, Vickki Muff, PA-C  Budeson-Glycopyrrol-Formoterol (BREZTRI AEROSPHERE) 160-9-4.8 MCG/ACT AERO Inhale 2 puffs into the lungs in the morning and at bedtime. 12/30/20   Tyler Pita, MD  carvedilol (COREG) 25 MG tablet Take 12.5 mg by mouth in the morning and at bedtime.    [provider]  cephALEXin (KEFLEX) 250 MG capsule Take 250 mg by mouth 2 (two) times daily. 01/02/21   [provider]  furosemide (LASIX) 40 MG tablet Take 1 tablet (40 mg total) by mouth as needed. Patient not taking: Reported on 01/05/2021 12/22/20 03/22/21  Kate Sable, MD  lamoTRIgine (LAMICTAL) 100 MG tablet Take  by mouth. 08/22/20   [provider]  lamoTRIgine (LAMICTAL) 25 MG tablet Take 100 mg by mouth 2 (two) times daily. 1 q hs x 2 weeks then BID Patient not taking: Reported on 01/05/2021    [provider]  Lidocaine HCl-Benzyl Alcohol (SALONPAS LIDOCAINE PLUS EX) Place 1 patch onto the skin daily as needed (pain.).    [provider]  Multiple Vitamin (MULTI-VITAMIN) tablet Take 1 tablet by mouth daily.    [provider]  sacubitril-valsartan (ENTRESTO) 49-51 MG Take 0.5 tablets by mouth in the morning and at bedtime.    [provider]  spironolactone (ALDACTONE) 25 MG tablet Take 12.5 mg by mouth at bedtime.    [provider]  tamsulosin (FLOMAX) 0.4 MG CAPS capsule Take 1 capsule (0.4 mg total) by mouth daily. 11/19/20   Chrismon, Vickki Muff, PA-C    Allergies Patient has no known allergies.  Family History  Problem Relation Age of Onset   Diabetes Mother    Heart disease Mother    Stroke Father    Heart disease Father    Heart attack Father    Alcohol abuse Father    Heart disease Sister    Heart attack Sister    Heart disease  Maternal Grandmother    Heart attack Maternal Grandmother    Heart attack Paternal Grandmother    Diabetes Brother    Heart disease Brother    Heart attack Brother     Social History Social History   Tobacco Use   Smoking status: Former    Packs/day: 2.00    Years: 53.00    Pack years: 106.00    Types: Cigarettes    Quit date: 05/28/2017    Years since quitting: 3.6   Smokeless tobacco: Never   Tobacco comments:    Quit in 2018  Vaping Use   Vaping Use: Former   Quit date: 01/15/2020   Devices: cbd   Substance Use Topics   Alcohol use: Yes    Comment: socially drink cocktail   Drug use: Yes    Types: Marijuana    Comment: last smoke x1 month ago    Review of Systems  Constitutional: No fever/chills Eyes: No visual changes. ENT: No sore throat. Cardiovascular: Denies chest pain.  Positive for decreased skin temperature of right lower extremity. Respiratory: Denies shortness of breath. Gastrointestinal: No abdominal pain.  No nausea, no vomiting.  No diarrhea.  No constipation. No dark, tarry stools. Genitourinary: Negative for dysuria. Musculoskeletal: Positive for right calf pain and swelling. Skin: Negative for rash. Neurological: Negative for headaches, focal weakness or numbness. ____________________________________________   PHYSICAL EXAM:  VITAL SIGNS: ED Triage Vitals  Enc Vitals Group     BP 01/27/21 1156 (!) 150/64     Pulse Rate 01/27/21 1154 74     Resp 01/27/21 1154 17     Temp 01/27/21 1154 97.6 F (36.4 C)     Temp Source 01/27/21 1154 Oral     SpO2 01/27/21 1154 97 %     Weight 01/27/21 1155 189 lb (85.7 kg)     Height 01/27/21 1155 5\' 10"  (1.778 m)     Head Circumference --      Peak Flow --      Pain Score 01/27/21 1155 4     Pain Loc --      Pain Edu? --      Excl. in [place]? --     Constitutional: Alert and oriented. Well appearing and in no acute distress. Eyes: Conjunctivae are normal. Head: Atraumatic. Nose: No congestion/rhinnorhea. Mouth/Throat: Mucous membranes are moist. Oropharynx non-erythematous. Neck: No stridor.   Hematological/Lymphatic/Immunilogical: No cervical lymphadenopathy. Cardiovascular: Normal rate, regular rhythm. Grossly normal heart sounds.  peripheral circulation. DP pulse in right foot 1+; DP in left 3+. No pitting edema. Right foot and ankle cooler than left. Respiratory: Normal respiratory effort.  No retractions. Lungs CTAB. Gastrointestinal: Soft and nontender. No distention. No abdominal bruits. Genitourinary:  Musculoskeletal: Right calf firm and swollen.  Neurologic:  Normal speech and language. No gross focal neurologic deficits are appreciated. No gait instability. Skin:  Skin is warm, dry and intact. No rash noted. Psychiatric: Mood and affect are normal. Speech and behavior are  normal.  ____________________________________________   LABS (all labs ordered are listed, but only abnormal results are displayed)  Labs Reviewed  BASIC METABOLIC PANEL - Abnormal; Notable for the following components:      Result Value   Creatinine, Ser 1.44 (*)    GFR, Estimated 52 (*)    All other components within normal limits  CBC WITH DIFFERENTIAL/PLATELET - Abnormal; Notable for the following components:   WBC 3.9 (*)    RBC 2.39 (*)    Hemoglobin 7.5 (*)  HCT 23.2 (*)    RDW 15.9 (*)    Platelets 142 (*)    All other components within normal limits   ____________________________________________  EKG  Not indicated. ____________________________________________  RADIOLOGY  ED MD interpretation:    CTA aortobifemoral with runoff shows  Less than 5 cm infrarenal abdominal aortic aneurysm with multiple areas of stenosis and atherosclerosis sclerosis without emergent concerns.  I, Sherrie George, personally viewed and evaluated these images (plain radiographs) as part of my medical decision making, as well as reviewing the written report by the radiologist.  Official radiology report(s): CT Angio Aortobifemoral W and/or Wo Contrast  Result Date: 01/27/2021 CLINICAL DATA:  Lower extremity pain and swelling, decreased capillary refill, right calf cramping EXAM: CT ANGIOGRAPHY OF ABDOMINAL AORTA WITH ILIOFEMORAL RUNOFF TECHNIQUE: Multidetector CT imaging of the abdomen, pelvis and lower extremities was performed using the standard protocol during bolus administration of intravenous contrast. Multiplanar CT image reconstructions and MIPs were obtained to evaluate the vascular anatomy. CONTRAST:  150mL OMNIPAQUE IOHEXOL 350 MG/ML SOLN COMPARISON:  None. FINDINGS: VASCULAR Aorta: The supra celiac and pararenal aorta are of normal caliber. An fusiform infrarenal abdominal aortic aneurysm is present with maximal transaxial dimensions of 4.1 x 4.2 cm (coronal # 61, sagittal #  103), terminating at the aortic bifurcation. A penetrating atherosclerotic ulcer is seen involving the left anteromedial aortic wall at the level of the origin of the celiac axis measuring 5 x 8 mm in greatest dimension. No evidence of rupture. No intramural hematoma. Moderate mixed atherosclerotic plaque. No evidence of hemodynamically significant stenosis. No dissection. Celiac: Widely patent. Normal anatomic configuration. No aneurysm or dissection. SMA: Less than 50% stenosis of the origin. Otherwise widely patent. No aneurysm or dissection. Renals: Single renal arteries bilaterally. Left renal artery demonstrates normal morphology. Focal dissection involving the proximal right renal artery results in a greater than 75% stenosis of the vessel, best seen on axial # 50/coronal # 65. The vessels are otherwise widely patent. IMA: Occluded at its origin.  Reconstituted by the marginal artery. RIGHT Lower Extremity Inflow: Multifocal mixed atherosclerotic plaque results in a focal 50% stenosis of the right common iliac artery and proximal right external iliac artery. Focal short segment dissection noted within the right external iliac artery, axial # 109, though this does not appear flow limiting. Internal iliac artery demonstrates a greater than 75% stenosis at its origin. Outflow: Moderate atherosclerotic plaque within the common femoral artery, less than 50% stenosis. Calcified atherosclerotic plaque results in a roughly 50% stenosis of the profundus femoral artery at its origin. Right superficial femoral artery demonstrates scattered mixed atherosclerotic plaque with a roughly 2.5 cm 50% stenosis involving the distal SFA secondary to eccentric noncalcified plaque. Runoff: Focal 50-75% stenosis of the P2 segment of the popliteal artery secondary to largely noncalcified plaque. Estimated 50% stenosis of the anterior tibial artery at its origin. Three-vessel runoff to the right foot. Patency of the dorsalis pedis  artery and posterior tibial artery. LEFT Lower Extremity Inflow: Scattered mixed plaque results in a focal 50-75% stenosis of the left external iliac artery beyond the bifurcation. Internal iliac artery is patent at its origin but is heavily diseased. Outflow: Less than 50% stenosis of the common femoral artery. Less than 50% stenosis of the profundus femoral artery at its origin. Scattered atherosclerotic plaque throughout the superficial femoral artery without evidence of hemodynamically significant stenosis. Runoff: Less than 50% stenosis of the P1 segment of the popliteal artery secondary to mixed plaque. Posterior tibial artery  occludes shortly beyond its takeoff. Two vessel runoff to the left foot. Peroneal artery reconstitutes the plantar arch. Dorsalis pedis artery is patent. Veins: Not optimally opacified on this examination. Retroaortic left renal vein noted. Review of the MIP images confirms the above findings. NON-VASCULAR Lower chest: Visualized lung bases are clear. Moderate right coronary artery calcification. Hepatobiliary: No focal liver abnormality is seen. No gallstones, gallbladder wall thickening, or biliary dilatation. Pancreas: Unremarkable Spleen: Unremarkable Adrenals/Urinary Tract: The adrenal glands are unremarkable. The kidneys are normal in position. Mild relative right renal cortical atrophy. Simple cortical cyst noted within the upper pole of the right kidney. No intrarenal masses or calcifications are seen. No hydronephrosis. Bladder unremarkable. Stomach/Bowel: Severe sigmoid diverticulosis. Stomach, small bowel, and large bowel are otherwise unremarkable. No free intraperitoneal gas or fluid. Lymphatic: No pathologic adenopathy within the abdomen and pelvis. Reproductive: Mild prostatic enlargement. Other: No abdominal wall hernia. 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 No acute bone abnormality. Osseous structures are age-appropriate. IMPRESSION: VASCULAR  4.2 cm infrarenal abdominal aortic aneurysm. Recommend follow-up every 12 months and vascular consultation. This recommendation follows ACR consensus guidelines: White Paper of the ACR Incidental Findings Committee II on Vascular Findings. J Am Coll Radiol 2013; 10:789-794. 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 No intramural hematoma or rupture. 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 Occlusion of the inferior mesenteric artery with reconstitution by the marginal artery. Multiple serial stenoses of the right lower extremity arterial inflow, outflow, and runoff with serial 50% stenoses of the right lower extremity arterial inflow,, tandem stenoses of the profundus femoral artery origin and distal right SFA, and focal stenosis of the a P2 segment of the right popliteal artery. Superimposed short segment non flow limiting dissection of the right external iliac artery. Three-vessel runoff to the right foot. Multiple serial stenoses involving the left lower extremity arterial inflow and runoff including a focal 50% stenosis of the left external iliac artery and occlusion of the left posterior tibial artery. Two vessel runoff to the left foot. NON-VASCULAR Severe sigmoid diverticulosis. Electronically Signed   By: Fidela Salisbury MD   On: 01/27/2021 19:47   US Venous Img Lower Unilateral Right  Result Date: 01/27/2021 CLINICAL DATA:  Right calf pain and swelling. EXAM: RIGHT LOWER EXTREMITY VENOUS DOPPLER ULTRASOUND TECHNIQUE: Gray-scale sonography with graded compression, as well as color Doppler and duplex ultrasound were performed to evaluate the lower extremity deep venous systems from the level of the common femoral vein and including the common femoral, femoral, profunda femoral, popliteal and calf veins including the posterior tibial, peroneal and gastrocnemius veins when visible. The superficial  great saphenous vein was also interrogated. Spectral Doppler was utilized to evaluate flow at rest and with distal augmentation maneuvers in the common femoral, femoral and popliteal veins. COMPARISON:  06/23/2020 FINDINGS: Contralateral Common Femoral Vein: Respiratory phasicity is normal and symmetric with the symptomatic side. No evidence of thrombus. Normal compressibility. Common Femoral Vein: No evidence of thrombus. Normal compressibility, respiratory phasicity and response to augmentation. Saphenofemoral Junction: No evidence of thrombus. Normal compressibility and flow on color Doppler imaging. Profunda Femoral Vein: No evidence of thrombus. Normal compressibility and flow on color Doppler imaging. Femoral Vein: No evidence of thrombus. Normal compressibility, respiratory phasicity and response to augmentation. Popliteal Vein: No evidence of thrombus. Normal compressibility, respiratory phasicity and response to augmentation. Calf Veins: No evidence of thrombus. Normal compressibility and flow on color Doppler  imaging. Other Findings: Area pain in the lateral right calf was imaged. There is a poorly defined hypoechoic collection that measures 6.8 x 1.5 x 1.3 cm in the superficial soft tissues. This hypoechoic collection is heterogeneous without internal vascularity. IMPRESSION: 1.  Negative for deep venous thrombosis in right lower extremity. 2. Complex hypoechoic collection along the lateral right calf at the area of concern. This is compatible with a complex fluid collection and could represent a hematoma. Electronically Signed   By: Markus Daft M.D.   On: 01/27/2021 13:21    ____________________________________________   PROCEDURES  Procedure(s) performed (including Critical Care):  Procedures  ____________________________________________   INITIAL IMPRESSION / ASSESSMENT AND PLAN     70 year old male presenting to the emergency department for treatment and evaluation of right lower  extremity pain and swelling with decreased skin temperature.  See HPI for further details.  DIFFERENTIAL DIAGNOSIS  DVT, developing limb ischemia, peripheral vascular disease  ED COURSE  CTA is without emergent findings. [place] is negative for DVT but shows a complex fluid collection that is potentially a hematoma. He is to follow up with Dr. Lucky Cowboy for known infrarenal aneurysm and vascular disease.   Hgb decreased to 7.5. Patient denies symptoms such as dizziness or weakness. He is followed by oncology/hematology and states that he has an upcoming appointment. Next appointment I see was recommended for December. Patient and wife advised to call tomorrow to request follow up from today's visit.   Findings discussed with ED attending, Dr. Archie Balboa. Patient will be discharged home with compression stocking Rx. Patient is to return to the ER for symptoms of concern if unable to schedule an appointment with primary care or specialists. _________________________________________   FINAL CLINICAL IMPRESSION(S) / ED DIAGNOSES  Final diagnoses:  Hematoma  Peripheral vascular disease ([place])  Anemia, unspecified type     ED Discharge Orders          Ordered    Compression stockings        01/27/21 2019             Richard Wiggins was evaluated in Emergency Department on 01/27/2021 for the symptoms described in the history of present illness. He was evaluated in the context of the global COVID-19 pandemic, which necessitated consideration that the patient might be at risk for infection with the SARS-CoV-2 virus that causes COVID-19. Institutional protocols and algorithms that pertain to the evaluation of patients at risk for COVID-19 are in a state of rapid change based on information released by regulatory bodies including the CDC and federal and state organizations. These policies and algorithms were followed during the patient's care in the ED.   Note:  This document was prepared using Dragon  voice recognition software and may include unintentional dictation errors.    Victorino Dike, FNP 01/27/21 2251    Nance Pear, MD 01/27/21 [phone]

## 2021-01-27 NOTE — ED Triage Notes (Signed)
Pt c/o right calf pain with swelling since last week.

## 2021-01-27 NOTE — ED Triage Notes (Signed)
Pt presents today with c/o of pain/swelling to RLE. He reports feeling a cramp during the night and then swelling this morning. He called PCP who suggested he be seen within 4 hours.

## 2021-01-27 NOTE — Telephone Encounter (Signed)
Patient Richard Wiggins are calling to report cramping and pain in R leg and ankle. Patient is having discoloration in the ankle- bruising. Patient is having difficulty walking on that leg. Call to office- call transferred to office for possible overflow scheduling at [facility].

## 2021-01-27 NOTE — Telephone Encounter (Signed)
Reason for Disposition  [1] Thigh or calf pain AND [2] only 1 side AND [3] present > 1 hour (Exception: chronic unchanged pain)  Answer Assessment - Initial Assessment Questions 1. ONSET: "When did the pain start?"      1-2 weeks 2. LOCATION: "Where is the pain located?"      R leg- discoloration in ankle- bruised, ankle swelling 3. PAIN: "How bad is the pain?"    (Scale 1-10; or mild, moderate, severe)   -  MILD (1-3): doesn't interfere with normal activities    -  MODERATE (4-7): interferes with normal activities (e.g., work or school) or awakens from sleep, limping    -  SEVERE (8-10): excruciating pain, unable to do any normal activities, unable to walk     Cramping and pain in the legs- hard to walk-moderate 4. WORK OR EXERCISE: "Has there been any recent work or exercise that involved this part of the body?"      no 5. CAUSE: "What do you think is causing the leg pain?"     Unsure- vascular disease 6. OTHER SYMPTOMS: "Do you have any other symptoms?" (e.g., chest pain, back pain, breathing difficulty, swelling, rash, fever, numbness, weakness)     Started as almost rash in foot/ankle- now discoloration, vomited yesterday 7. PREGNANCY: "Is there any chance you are pregnant?" "When was your last menstrual period?"     N/a  Protocols used: Leg Pain-A-AH

## 2021-01-27 NOTE — Discharge Instructions (Addendum)
Please call tomorrow to request an appointment with oncology/hematology for further evaluation of anemia.  Return to the ER for symptoms of concern if unable to schedule an appointment with primary care or the specialists.

## 2021-01-27 NOTE — Discharge Instructions (Addendum)
Your current condition warrants further evaluation and/or treatment which exceed services available to you in this urgent care setting. I have discussed with you your currrent condition and the need for further evaluation and/or treatment in an emergency department setting. In response to my medical recommendation, you have opted to go to the emergency department at: [facility]

## 2021-01-27 NOTE — Telephone Encounter (Signed)
Spoke to patient's wife and told her Dr. Neomia Dear could see patient at 3:00 at [facility] but they declined and wanted to go to the Urgent Care on [address]   Hanley Seamen her the address and phone # to Urgent Care.

## 2021-01-27 NOTE — ED Provider Notes (Signed)
Chief Complaint   Chief Complaint  Patient presents with   Leg Pain    RLE     Subjective, HPI  Richard Wiggins is a 70 y.o. male who presents with right lower extremity swelling for which he noticed yesterday night after feeling a cramp in his right calf.  Patient states that the area has began swelling more today to include his ankle and foot on the right side.  Patient states he has had some difficulty walking for over 1 month to the right lower extremity.  Patient does not report any plane rides or recent long trips or periods of immobility.  He does not report any fever, chills or vomiting.  She is currently taking Eliquis.  History obtained from patient.  Patient's problem list, past medical and social history, medications, and allergies were reviewed by me and updated in Epic.    ROS  See HPI.  Objective   Vitals:   01/27/21 1035  BP: 127/78  Pulse: 79  Resp: 18  Temp: 99.1 F (37.3 C)  SpO2: 98%    Vital signs and nursing note reviewed.   General: Appears well-developed and well-nourished. No acute distress.  Head: Normocephalic and atraumatic.   Neck: Normal range of motion, neck is supple.  Cardiovascular: Normal rate. Pulm/Chest: No respiratory distress.  Musculoskeletal: Right lower extremity: Moderate swelling noted to lateral aspect of right calf with firmness appreciated without overlying erythema or significant heat.  The patient does have moderate swelling noted to the right ankle with some mild associated swelling to the right foot.  Toes of the right foot have decreased capillary refill toe less than 3 seconds with a bluish hue to nailbeds.  Toes are cool in comparison to warmth of foot, ankle and calf.  Full range of motion.  5/5 strength, full sensation, diminished pedal pulses to the right foot. Neurological: Alert and oriented to person, place, and time.  Skin: Skin is warm and dry.   Psychiatric: Normal mood, affect, behavior, and thought content.     Data  No results found for any visits on 01/27/21.    Assessment & Plan  1. Swelling of right lower extremity  No orders of the defined types were placed in this encounter.    70 y.o. male presents with right lower extremity swelling for which he noticed yesterday night after feeling a cramp in his right calf.  Patient states that the area has began swelling more today to include his ankle and foot on the right side.  Patient states he has had some difficulty walking for over 1 month to the right lower extremity.  Patient does not report any plane rides or recent long trips or periods of immobility.  He does not report any fever, chills or vomiting.  She is currently taking Eliquis.  Chart review completed.  The patient is currently taking Eliquis, but there is concern that he may have a blood clot to the right lower extremity.  Patient's foot and toes appear to have decreased perfusion.  Patient will need elevation of care to the emergency department today for further investigation.  The patient has had no injury to the area, as such I do not suspect underlying fracture.  Patient to report to [facility] via private vehicle with his wife.  Return as needed.  Stable on discharge.  Plan:   Discharge Instructions      Your current condition warrants further evaluation and/or treatment which exceed services available to you in this  urgent care setting. I have discussed with you your currrent condition and the need for further evaluation and/or treatment in an emergency department setting. In response to my medical recommendation, you have opted to go to the emergency department at: [facility]         Serafina Royals, FNP-C 01/27/21   This note was partially made with the aid of speech-to-text dictation; typographical errors are not intentional.    Serafina Royals, [place] 01/27/21 1113

## 2021-01-28 ENCOUNTER — Telehealth: Payer: Self-pay | Admitting: *Deleted

## 2021-01-28 NOTE — Telephone Encounter (Signed)
Patient had labs drawn at the hospital yesterday wife would like to be called wit results when Dr. Janese Banks has reviewed them.

## 2021-01-29 ENCOUNTER — Other Ambulatory Visit: Payer: Self-pay | Admitting: *Deleted

## 2021-01-29 ENCOUNTER — Telehealth: Payer: Self-pay | Admitting: *Deleted

## 2021-01-29 NOTE — Telephone Encounter (Signed)
Patient is scheduled. Spoke with wife Butch Penny to confirm.

## 2021-01-29 NOTE — Telephone Encounter (Signed)
Patient was discharged after 7 day hospitalization. Discharge instructions were to follow up with Dr. Janese Banks.

## 2021-01-29 NOTE — Telephone Encounter (Signed)
Please put patient on NP schedule next week for anemia work up [place] or la[place]

## 2021-01-30 MED ORDER — FUROSEMIDE 40 MG PO TABS: 40.0000 mg | ORAL_TABLET | ORAL | 0 refills | Status: DC | PRN

## 2021-02-02 ENCOUNTER — Encounter (INDEPENDENT_AMBULATORY_CARE_PROVIDER_SITE_OTHER): Payer: Medicare Other | Admitting: Family Medicine

## 2021-02-02 ENCOUNTER — Other Ambulatory Visit: Payer: Self-pay

## 2021-02-02 NOTE — Progress Notes (Signed)
No visit, appointment cancelled

## 2021-02-03 ENCOUNTER — Inpatient Hospital Stay: Payer: Medicare Other | Attending: Oncology | Admitting: Oncology

## 2021-02-03 ENCOUNTER — Inpatient Hospital Stay: Payer: Medicare Other | Admitting: Oncology

## 2021-02-03 ENCOUNTER — Other Ambulatory Visit: Payer: Self-pay

## 2021-02-03 ENCOUNTER — Ambulatory Visit (INDEPENDENT_AMBULATORY_CARE_PROVIDER_SITE_OTHER): Payer: Medicare Other | Admitting: Vascular Surgery

## 2021-02-03 VITALS — BP 124/63 | HR 68 | Temp 98.2°F | Resp 16 | Wt 199.0 lb

## 2021-02-03 VITALS — BP 133/74 | HR 66 | Ht 70.0 in | Wt 198.0 lb

## 2021-02-03 DIAGNOSIS — I714 Abdominal aortic aneurysm, without rupture, unspecified: Secondary | ICD-10-CM

## 2021-02-03 DIAGNOSIS — D649 Anemia, unspecified: Secondary | ICD-10-CM

## 2021-02-03 DIAGNOSIS — I10 Essential (primary) hypertension: Secondary | ICD-10-CM

## 2021-02-03 DIAGNOSIS — R7303 Prediabetes: Secondary | ICD-10-CM | POA: Diagnosis not present

## 2021-02-03 DIAGNOSIS — I70213 Atherosclerosis of native arteries of extremities with intermittent claudication, bilateral legs: Secondary | ICD-10-CM

## 2021-02-03 DIAGNOSIS — N189 Chronic kidney disease, unspecified: Secondary | ICD-10-CM | POA: Diagnosis not present

## 2021-02-03 DIAGNOSIS — E785 Hyperlipidemia, unspecified: Secondary | ICD-10-CM

## 2021-02-03 DIAGNOSIS — Z87891 Personal history of nicotine dependence: Secondary | ICD-10-CM | POA: Diagnosis not present

## 2021-02-03 DIAGNOSIS — Z7901 Long term (current) use of anticoagulants: Secondary | ICD-10-CM | POA: Diagnosis not present

## 2021-02-03 DIAGNOSIS — C3412 Malignant neoplasm of upper lobe, left bronchus or lung: Secondary | ICD-10-CM | POA: Insufficient documentation

## 2021-02-03 LAB — TECHNOLOGIST SMEAR REVIEW: Plt Morphology: ADEQUATE

## 2021-02-03 LAB — FOLATE: Folate: 21.4 ng/mL (ref >5.9)

## 2021-02-03 LAB — COMPREHENSIVE METABOLIC PANEL
ALT: 13 U/L (ref 0–44)
AST: 19 U/L (ref 15–41)
Albumin: 3.6 g/dL (ref 3.5–5.0)
Alkaline Phosphatase: 102 U/L (ref 38–126)
Anion gap: 7 (ref 5–15)
BUN: 26 mg/dL — ABNORMAL HIGH (ref 8–23)
CO2: 24 mmol/L (ref 22–32)
Calcium: 9.1 mg/dL (ref 8.9–10.3)
Chloride: 106 mmol/L (ref 98–111)
Creatinine, Ser: 1.45 mg/dL — ABNORMAL HIGH (ref 0.61–1.24)
GFR, Estimated: 52 mL/min — ABNORMAL LOW (ref >60)
Glucose, Bld: 91 mg/dL (ref 70–99)
Potassium: 5.5 mmol/L — ABNORMAL HIGH (ref 3.5–5.1)
Sodium: 137 mmol/L (ref 135–145)
Total Bilirubin: 0.5 mg/dL (ref 0.3–1.2)
Total Protein: 7.3 g/dL (ref 6.5–8.1)

## 2021-02-03 LAB — IRON AND TIBC
Iron: 49 ug/dL (ref 45–182)
Saturation Ratios: 15 % — ABNORMAL LOW (ref 17.9–39.5)
TIBC: 323 ug/dL (ref 250–450)
UIBC: 274 ug/dL

## 2021-02-03 LAB — CBC WITH DIFFERENTIAL/PLATELET
Abs Immature Granulocytes: 0.01 10*3/uL (ref 0.00–0.07)
Basophils Absolute: 0 10*3/uL (ref 0.0–0.1)
Basophils Relative: 0 %
Eosinophils Absolute: 0.1 10*3/uL (ref 0.0–0.5)
Eosinophils Relative: 3 %
HCT: 24.8 % — ABNORMAL LOW (ref 39.0–52.0)
Hemoglobin: 7.5 g/dL — ABNORMAL LOW (ref 13.0–17.0)
Immature Granulocytes: 0 %
Lymphocytes Relative: 15 %
Lymphs Abs: 0.7 10*3/uL (ref 0.7–4.0)
MCH: 30.2 pg (ref 26.0–34.0)
MCHC: 30.2 g/dL (ref 30.0–36.0)
MCV: 100 fL (ref 80.0–100.0)
Monocytes Absolute: 0.4 10*3/uL (ref 0.1–1.0)
Monocytes Relative: 9 %
Neutro Abs: 3.2 10*3/uL (ref 1.7–7.7)
Neutrophils Relative %: 73 %
Platelets: 155 10*3/uL (ref 150–400)
RBC: 2.48 MIL/uL — ABNORMAL LOW (ref 4.22–5.81)
RDW: 15.9 % — ABNORMAL HIGH (ref 11.5–15.5)
WBC: 4.3 10*3/uL (ref 4.0–10.5)
nRBC: 0 % (ref 0.0–0.2)

## 2021-02-03 LAB — RETICULOCYTES
Immature Retic Fract: 22 % — ABNORMAL HIGH (ref 2.3–15.9)
RBC.: 2.47 MIL/uL — ABNORMAL LOW (ref 4.22–5.81)
Retic Count, Absolute: 65.2 10*3/uL (ref 19.0–186.0)
Retic Ct Pct: 2.6 % (ref 0.4–3.1)

## 2021-02-03 LAB — DAT, POLYSPECIFIC AHG (ARMC ONLY): Polyspecific AHG test: NEGATIVE

## 2021-02-03 LAB — FERRITIN: Ferritin: 81 ng/mL (ref 24–336)

## 2021-02-03 LAB — LACTATE DEHYDROGENASE: LDH: 130 U/L (ref 98–192)

## 2021-02-03 NOTE — Assessment & Plan Note (Signed)
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  The patient is considering whether or not he wants to have intervention.

## 2021-02-03 NOTE — Progress Notes (Addendum)
Hematology/Oncology Consult note [facility]  Telephone:([phone] [phone]  Patient Care Team: Chrismon, Driscilla Grammes as PCP - General (Family Medicine) Telford Nab, RN as Oncology Nurse Navigator Noreene Filbert, MD as Referring Physician (Radiation Oncology) Sindy Guadeloupe, MD as Consulting Physician (Oncology)   Name of the patient: Richard Wiggins  [id number]  Jun 16, 1951   Date of visit: 02/03/21  Diagnosis- stage I lung cancer s/p SBRT and new onset anemia   Chief complaint/ Reason for visit-Anemia   Heme/Onc history: Patient is a 70 year old male who was a former smoker and quit in 2018.  He went to the ER in July 2021 for possible strokelike symptoms and was found to have a left upper lobe lung nodule which was followed up with a CT chest which showed a 1.2 x 1.1 cm left upper lobe spiculated nodule.  This was followed by a PET scan which showed that nodule was hypermetabolic with an SUV of 7.41 patient was also incidentally noted to have a 4.3 cm abdominal aortic aneurysm.  He follows up with Dr. Lucky Cowboy for his aneurysm.  He was seen by Dr. Patsey Berthold and underwent ENB.  Biopsy was nondiagnostic.  However given that the nodule was hypermetabolic it was concerning for malignancy and recommendation per Dr. Patsey Berthold was empiric radiation. Patient underwent SBRT to his left upper lobe by Dr. Baruch Gouty   Patient also has a positive fit test.  Colonoscopy in January 2022 showed no evidence of colon cancer.  There wereCouple of polyps which were resected and were consistent with tubular adenoma.  He was recently seen in the emergency room for right lower extremity swelling and cramping in his right calf that was making it hard for him to ambulate.  Work-up included Doppler of right lower extremity which showed complex fluid collection that is potentially a hematoma.  He also had a CTA which did not reveal any emergent findings and he is followed  closely by Dr. Lucky Cowboy.  Interval history-During work-up in the emergency room he was found to have a hemoglobin of 7.5 (9.0).  He denies any bleeding.  He is on Eliquis.  He does report symptoms of fatigue and shortness of breath and overall feeling poorly.  He continues to have trouble walking due to generalized weakness and right leg pain.  He has had anemia work-up in the past which showed normal iron studies, folate with hemoglobin of 9 and platelet count of 144,000.  ECOG PS- 1 Pain scale- 0   Review of systems- Review of Systems  Constitutional:  Positive for malaise/fatigue. Negative for chills, fever and weight loss.  HENT:  Negative for congestion, ear discharge and nosebleeds.   Eyes:  Negative for blurred vision.  Respiratory:  Positive for shortness of breath. Negative for cough, hemoptysis, sputum production and wheezing.   Cardiovascular:  Negative for chest pain, palpitations, orthopnea and claudication.  Gastrointestinal:  Negative for abdominal pain, blood in stool, constipation, diarrhea, heartburn, melena, nausea and vomiting.  Genitourinary:  Negative for dysuria, flank pain, frequency, hematuria and urgency.  Musculoskeletal:  Negative for back pain, joint pain and myalgias.       Right leg pain and swelling  Skin:  Negative for rash.  Neurological:  Positive for weakness. Negative for dizziness, tingling, focal weakness, seizures and headaches.  Endo/Heme/Allergies:  Does not bruise/bleed easily.  Psychiatric/Behavioral:  Negative for depression and suicidal ideas. The patient does not have insomnia.     No Known Allergies  Past Medical History:  Diagnosis Date   AAA (abdominal aortic aneurysm) ([place]) 2020   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 -plan medical managmment with tight bp control   Anemia 02/09/2020   Anxiety    Benign essential HTN 02/10/2020   BPH (benign prostatic hyperplasia) 02/09/2020   Brain aneurysm    Cancer (HCC)    CHF (congestive heart failure) (HCC)     Chronic kidney disease    COPD (chronic obstructive pulmonary disease) (HCC)    Coronary artery disease    Depression    Dysrhythmia    Emphysema of lung (HCC)    History of common carotid artery stent placement    Hypertension    IDA (iron deficiency anemia) 02/09/2020   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   Marijuana use, continuous 02/10/2020   Osteoporosis    PAF (paroxysmal atrial fibrillation) (HCC)    Pulmonary emboli ([place]) 06/07/2017   Right lower lobe pulmonary embolism small segmental, multifocal multifocal pneumonia, mediastinal lymphadenopathy, moderate centrilobular emphysema   Seizures (HCC)    Stroke (HCC)    left sided weakness to arm and leg     Past Surgical History:  Procedure Laterality Date   brain aneurysm with clip     COLONOSCOPY WITH PROPOFOL N/A 07/23/2020   Procedure: COLONOSCOPY WITH PROPOFOL;  Surgeon: Lin Landsman, MD;  Location: [facility] ENDOSCOPY;  Service: Gastroenterology;  Laterality: N/A;   CORONARY ANGIOPLASTY WITH STENT PLACEMENT  07/08/2017   hip repleacement Right    Right total hip arthroplasty   VIDEO BRONCHOSCOPY WITH ENDOBRONCHIAL NAVIGATION N/A 04/23/2020   Procedure: VIDEO BRONCHOSCOPY WITH ENDOBRONCHIAL NAVIGATION;  Surgeon: Tyler Pita, MD;  Location: [facility] ORS;  Service: Pulmonary;  Laterality: N/A;    Social History   Socioeconomic History   Marital status: Married    Spouse name: Not on file   Number of children: Not on file   Years of education: Not on file   Highest education level: High school graduate  Occupational History   Occupation: Retired  Tobacco Use   Smoking status: Former    Packs/day: 2.00    Years: 53.00    Pack years: 106.00    Types: Cigarettes    Quit date: 05/28/2017    Years since quitting: 3.6   Smokeless tobacco: Never   Tobacco comments:    Quit in 2018  Vaping Use   Vaping Use: Former   Quit date: 01/15/2020   Devices: cbd   Substance and Sexual Activity   Alcohol use: Yes     Comment: socially drink cocktail   Drug use: Yes    Types: Marijuana    Comment: last smoke x1 month ago   Sexual activity: Not Currently  Other Topics Concern   Not on file  Social History Narrative   Lives with wife. 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 Children x2 children and grandchildren grown.    Social Determinants of Health   Financial Resource Strain: Not on file  Food Insecurity: Not on file  Transportation Needs: Not on file  Physical Activity: Not on file  Stress: Not on file  Social Connections: Not on file  Intimate Partner Violence: Not on file    Family History  Problem Relation Age of Onset   Diabetes Mother    Heart disease Mother    Stroke Father    Heart disease Father    Heart attack Father    Alcohol abuse Father    Heart disease Sister    Heart  attack Sister    Heart disease Maternal Grandmother    Heart attack Maternal Grandmother    Heart attack Paternal Grandmother    Diabetes Brother    Heart disease Brother    Heart attack Brother      Current Outpatient Medications:    albuterol (PROVENTIL) (2.5 MG/3ML) 0.083% nebulizer solution, Take 3 mLs (2.5 mg total) by nebulization every 6 (six) hours as needed for wheezing or shortness of breath., Disp: 75 mL, Rfl: 6   apixaban (ELIQUIS) 5 MG TABS tablet, Take 1 tablet (5 mg total) by mouth in the morning and at bedtime., Disp: 90 tablet, Rfl: 2   aspirin EC 81 MG tablet, Take 81 mg by mouth daily. Swallow whole., Disp: , Rfl:    atorvastatin (LIPITOR) 40 MG tablet, TAKE 1 TABLET(40 MG) BY MOUTH AT BEDTIME, Disp: 90 tablet, Rfl: 3   Budeson-Glycopyrrol-Formoterol (BREZTRI AEROSPHERE) 160-9-4.8 MCG/ACT AERO, Inhale 2 puffs into the lungs in the morning and at bedtime., Disp: 10.7 g, Rfl: 11   carvedilol (COREG) 25 MG tablet, Take 12.5 mg by mouth in the morning and at bedtime., Disp: , Rfl:    cephALEXin (KEFLEX) 250 MG capsule, Take 250 mg by mouth 2 (two) times daily., Disp: , Rfl:     furosemide (LASIX) 40 MG tablet, Take 1 tablet (40 mg total) by mouth as needed., Disp: 30 tablet, Rfl: 0   lamoTRIgine (LAMICTAL) 100 MG tablet, Take by mouth., Disp: , Rfl:    lamoTRIgine (LAMICTAL) 25 MG tablet, Take 100 mg by mouth 2 (two) times daily. 1 q hs x 2 weeks then BID (Patient not taking: Reported on 01/05/2021), Disp: , Rfl:    Lidocaine HCl-Benzyl Alcohol (SALONPAS LIDOCAINE PLUS EX), Place 1 patch onto the skin daily as needed (pain.)., Disp: , Rfl:    Multiple Vitamin (MULTI-VITAMIN) tablet, Take 1 tablet by mouth daily., Disp: , Rfl:    sacubitril-valsartan (ENTRESTO) 49-51 MG, Take 0.5 tablets by mouth in the morning and at bedtime., Disp: , Rfl:    spironolactone (ALDACTONE) 25 MG tablet, Take 12.5 mg by mouth at bedtime., Disp: , Rfl:    tamsulosin (FLOMAX) 0.4 MG CAPS capsule, Take 1 capsule (0.4 mg total) by mouth daily., Disp: 90 capsule, Rfl: 2  Physical exam:  There were no vitals filed for this visit.  Physical Exam Constitutional:      Appearance: Normal appearance.  HENT:     Head: Normocephalic and atraumatic.  Eyes:     Pupils: Pupils are equal, round, and reactive to light.  Cardiovascular:     Rate and Rhythm: Normal rate and regular rhythm.     Heart sounds: Normal heart sounds. No murmur heard. Pulmonary:     Effort: Pulmonary effort is normal.     Breath sounds: Normal breath sounds. No wheezing.  Abdominal:     General: Bowel sounds are normal. There is no distension.     Palpations: Abdomen is soft.     Tenderness: There is no abdominal tenderness.  Musculoskeletal:        General: Normal range of motion.     Cervical back: Normal range of motion.     Right lower leg: 2+ Edema present.  Skin:    General: Skin is warm and dry.     Findings: No rash.  Neurological:     Mental Status: He is alert and oriented to person, place, and time.  Psychiatric:        Judgment: Judgment normal.  CMP Latest Ref Rng & Units 01/27/2021  Glucose 70 -  99 mg/dL 98  BUN 8 - 23 mg/dL 22  Creatinine 0.61 - 1.24 mg/dL 1.44(H)  Sodium 135 - 145 mmol/L 135  Potassium 3.5 - 5.1 mmol/L 4.7  Chloride 98 - 111 mmol/L 103  CO2 22 - 32 mmol/L 25  Calcium 8.9 - 10.3 mg/dL 9.0  Total Protein 6.0 - 8.3 g/dL -  Total Bilirubin 0.2 - 1.2 mg/dL -  Alkaline Phos 39 - 117 U/L -  AST 0 - 37 U/L -  ALT 0 - 53 U/L -   CBC Latest Ref Rng & Units 01/27/2021  WBC 4.0 - 10.5 K/uL 3.9(L)  Hemoglobin 13.0 - 17.0 g/dL 7.5(L)  Hematocrit 39.0 - 52.0 % 23.2(L)  Platelets 150 - 400 K/uL 142(L)    No images are attached to the encounter.  CT Angio Aortobifemoral W and/or Wo Contrast  Result Date: 01/27/2021 CLINICAL DATA:  Lower extremity pain and swelling, decreased capillary refill, right calf cramping EXAM: CT ANGIOGRAPHY OF ABDOMINAL AORTA WITH ILIOFEMORAL RUNOFF TECHNIQUE: Multidetector CT imaging of the abdomen, pelvis and lower extremities was performed using the standard protocol during bolus administration of intravenous contrast. Multiplanar CT image reconstructions and MIPs were obtained to evaluate the vascular anatomy. CONTRAST:  174m OMNIPAQUE IOHEXOL 350 MG/ML SOLN COMPARISON:  None. FINDINGS: VASCULAR Aorta: The supra celiac and pararenal aorta are of normal caliber. An fusiform infrarenal abdominal aortic aneurysm is present with maximal transaxial dimensions of 4.1 x 4.2 cm (coronal # 61, sagittal # 103), terminating at the aortic bifurcation. A penetrating atherosclerotic ulcer is seen involving the left anteromedial aortic wall at the level of the origin of the celiac axis measuring 5 x 8 mm in greatest dimension. No evidence of rupture. No intramural hematoma. Moderate mixed atherosclerotic plaque. No evidence of hemodynamically significant stenosis. No dissection. Celiac: Widely patent. Normal anatomic configuration. No aneurysm or dissection. SMA: Less than 50% stenosis of the origin. Otherwise widely patent. No aneurysm or dissection. Renals: Single  renal arteries bilaterally. Left renal artery demonstrates normal morphology. Focal dissection involving the proximal right renal artery results in a greater than 75% stenosis of the vessel, best seen on axial # 50/coronal # 65. The vessels are otherwise widely patent. IMA: Occluded at its origin.  Reconstituted by the marginal artery. RIGHT Lower Extremity Inflow: Multifocal mixed atherosclerotic plaque results in a focal 50% stenosis of the right common iliac artery and proximal right external iliac artery. Focal short segment dissection noted within the right external iliac artery, axial # 109, though this does not appear flow limiting. Internal iliac artery demonstrates a greater than 75% stenosis at its origin. Outflow: Moderate atherosclerotic plaque within the common femoral artery, less than 50% stenosis. Calcified atherosclerotic plaque results in a roughly 50% stenosis of the profundus femoral artery at its origin. Right superficial femoral artery demonstrates scattered mixed atherosclerotic plaque with a roughly 2.5 cm 50% stenosis involving the distal SFA secondary to eccentric noncalcified plaque. Runoff: Focal 50-75% stenosis of the P2 segment of the popliteal artery secondary to largely noncalcified plaque. Estimated 50% stenosis of the anterior tibial artery at its origin. Three-vessel runoff to the right foot. Patency of the dorsalis pedis artery and posterior tibial artery. LEFT Lower Extremity Inflow: Scattered mixed plaque results in a focal 50-75% stenosis of the left external iliac artery beyond the bifurcation. Internal iliac artery is patent at its origin but is heavily diseased. Outflow: Less than 50% stenosis of the  common femoral artery. Less than 50% stenosis of the profundus femoral artery at its origin. Scattered atherosclerotic plaque throughout the superficial femoral artery without evidence of hemodynamically significant stenosis. Runoff: Less than 50% stenosis of the P1 segment of  the popliteal artery secondary to mixed plaque. Posterior tibial artery occludes shortly beyond its takeoff. Two vessel runoff to the left foot. Peroneal artery reconstitutes the plantar arch. Dorsalis pedis artery is patent. Veins: Not optimally opacified on this examination. Retroaortic left renal vein noted. Review of the MIP images confirms the above findings. NON-VASCULAR Lower chest: Visualized lung bases are clear. Moderate right coronary artery calcification. Hepatobiliary: No focal liver abnormality is seen. No gallstones, gallbladder wall thickening, or biliary dilatation. Pancreas: Unremarkable Spleen: Unremarkable Adrenals/Urinary Tract: The adrenal glands are unremarkable. The kidneys are normal in position. Mild relative right renal cortical atrophy. Simple cortical cyst noted within the upper pole of the right kidney. No intrarenal masses or calcifications are seen. No hydronephrosis. Bladder unremarkable. Stomach/Bowel: Severe sigmoid diverticulosis. Stomach, small bowel, and large bowel are otherwise unremarkable. No free intraperitoneal gas or fluid. Lymphatic: No pathologic adenopathy within the abdomen and pelvis. Reproductive: Mild prostatic enlargement. Other: No abdominal wall hernia. 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 No acute bone abnormality. Osseous structures are age-appropriate. IMPRESSION: VASCULAR 4.2 cm infrarenal abdominal aortic aneurysm. Recommend follow-up every 12 months and vascular consultation. This recommendation follows ACR consensus guidelines: White Paper of the ACR Incidental Findings Committee II on Vascular Findings. J Am Coll Radiol 2013; 10:789-794. 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 No intramural hematoma or rupture. 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 Occlusion of the inferior mesenteric artery with reconstitution by  the marginal artery. Multiple serial stenoses of the right lower extremity arterial inflow, outflow, and runoff with serial 50% stenoses of the right lower extremity arterial inflow,, tandem stenoses of the profundus femoral artery origin and distal right SFA, and focal stenosis of the a P2 segment of the right popliteal artery. Superimposed short segment non flow limiting dissection of the right external iliac artery. Three-vessel runoff to the right foot. Multiple serial stenoses involving the left lower extremity arterial inflow and runoff including a focal 50% stenosis of the left external iliac artery and occlusion of the left posterior tibial artery. Two vessel runoff to the left foot. NON-VASCULAR Severe sigmoid diverticulosis. Electronically Signed   By: Fidela Salisbury MD   On: 01/27/2021 19:47   US Venous Img Lower Unilateral Right  Result Date: 01/27/2021 CLINICAL DATA:  Right calf pain and swelling. EXAM: RIGHT LOWER EXTREMITY VENOUS DOPPLER ULTRASOUND TECHNIQUE: Gray-scale sonography with graded compression, as well as color Doppler and duplex ultrasound were performed to evaluate the lower extremity deep venous systems from the level of the common femoral vein and including the common femoral, femoral, profunda femoral, popliteal and calf veins including the posterior tibial, peroneal and gastrocnemius veins when visible. The superficial great saphenous vein was also interrogated. Spectral Doppler was utilized to evaluate flow at rest and with distal augmentation maneuvers in the common femoral, femoral and popliteal veins. COMPARISON:  06/23/2020 FINDINGS: Contralateral Common Femoral Vein: Respiratory phasicity is normal and symmetric with the symptomatic side. No evidence of thrombus. Normal compressibility. Common Femoral Vein: No evidence of thrombus. Normal compressibility, respiratory phasicity and response to augmentation. Saphenofemoral Junction: No evidence of thrombus. Normal  compressibility and flow on color Doppler imaging. Profunda Femoral Vein: No evidence of  thrombus. Normal compressibility and flow on color Doppler imaging. Femoral Vein: No evidence of thrombus. Normal compressibility, respiratory phasicity and response to augmentation. Popliteal Vein: No evidence of thrombus. Normal compressibility, respiratory phasicity and response to augmentation. Calf Veins: No evidence of thrombus. Normal compressibility and flow on color Doppler imaging. Other Findings: Area pain in the lateral right calf was imaged. There is a poorly defined hypoechoic collection that measures 6.8 x 1.5 x 1.3 cm in the superficial soft tissues. This hypoechoic collection is heterogeneous without internal vascularity. IMPRESSION: 1.  Negative for deep venous thrombosis in right lower extremity. 2. Complex hypoechoic collection along the lateral right calf at the area of concern. This is compatible with a complex fluid collection and could represent a hematoma. Electronically Signed   By: Markus Daft M.D.   On: 01/27/2021 13:21     Assessment and plan- Patient is a 70 y.o. male with history of stage I presumed lung cancer s/p SBRT and acute anemia work-up.  Stage I presumed lung cancer- Status post SBRT. Most recent CT scan from 01/02/2021 showed stable pulmonary nodules with mildly enlarged mediastinal lymph node. Plan is to repeat in December 2022.  Anemia: Unclear etiology. Hemoglobin was around 12 in November 2021. Likely multifactorial including some IDA, CKD and GI bleed. Found to have a positive fit test back in December 2021 prompting a colonoscopy. Colonoscopy from 07/23/2020 showed multiple diverticuli, 2 sessile polyps in transverse colon that were removed and nonbleeding external and internal hemorrhoids. Work-up showed ferritin 90, folate 28, iron saturations 18% and hemoglobin of 9.0. Labs are not consistent with iron deficiency anemia. Patient is on Eliquis.  He denies any GI  bleeding. Spoke with Dr. Janese Banks who recommends repeating labs including CBC, CMP, iron panel, ferritin, folate, reticulocyte count and TSH. Will also get multiple myeloma panel, LDH, haptoglobin and Coombs test. If labs come back and show hemoglobin less than 7 we will go ahead and get him set up for a blood transfusion otherwise we will have him return to clinic in 1 to 2 weeks to review lab work with Dr. Janese Banks.  Disposition: Labs today. RTC in 2 weeks to see Dr. Janese Banks to review results  Addendum: Labs show iron saturation 15%, ferritin 81 with a hemoglobin of 7.5.  Spoke with Dr. Janese Banks who recommends 5 doses of IV iron.  This has been scheduled and iron has been ordered.  He also has evidence of CKD with creatinine at 1.45.  He may benefit from EPO treatment should hemoglobin not improve with iron.  He is scheduled to return to clinic in 2 months to see Dr. Janese Banks.  I spent 35 minutes dedicated to the care of this patient (face-to-face and non-face-to-face) on the date of the encounter to include what is described in the assessment and plan.  Visit Diagnosis No diagnosis found.  Faythe Casa, NP 02/03/2021 1:01 PM

## 2021-02-03 NOTE — Assessment & Plan Note (Signed)
From a size standpoint, this was stable at 4.2 cm on his recent CT scan.  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  We discussed with the patient and his wife we can consider repair of the aneurysm concomitant iliac artery intervention for occlusive disease.  This would essentially kill 2 birds with 1 stone although he still has infrainguinal occlusive disease as well.  We can potentially address that as well, although we may have to consider an Endologix stent graft or other stent graft if we are planning to go up and over the aortic bifurcation to treat this after his aneurysm repair.  They want to go home and think about their options and determine whether or not they want to proceed.  They already have a follow-up for about 2 months from now if they do not decide to have any intervention at this time.

## 2021-02-03 NOTE — Progress Notes (Signed)
MRN : [id number]  Richard Wiggins is a 70 y.o. ([birth year]) male who presents with chief complaint of  Chief Complaint  Patient presents with   Follow-up    [facility] post PLE swelling / hematoma   .  History of Present Illness: Patient returns today in follow up of multiple vascular issues after a recent hospitalization.  He had some swelling in his right leg with an ultrasound which suggested a hematoma involving in his right leg.  This is settled with a lot of bruising down around his foot and ankle.  This may have been a ruptured Baker's cyst although not entirely clear.  He did not have a DVT or superficial thrombophlebitis.  He does have significant claudication symptoms in both legs as well as erectile dysfunction.  While he was in the hospital, he underwent a CT angiogram of the abdomen pelvis which included the upper thigh which I have independently reviewed.  This shows fairly significant atherosclerotic and aneurysmal disease.  His abdominal aortic aneurysm which was known measured about 4.2 cm in maximal diameter which is stable.  He had a right renal artery stenosis.  He and his wife report that his blood pressure is generally well controlled and his renal function is reasonably good although not entirely normal.  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  This appeared to be fairly high-grade on both sides.  Also, the femoral arteries in the proximal portions of the SFA appeared to be diseased on the CT scan as well although this view was limited as this was the termination of the scan.  He does complain of relatively equal claudication symptoms in both leg.  He does not have pain that wakes him at night or ulceration.  Current Outpatient Medications  Medication Sig Dispense Refill   albuterol (PROVENTIL) (2.5 MG/3ML) 0.083% nebulizer solution Take 3 mLs (2.5 mg  total) by nebulization every 6 (six) hours as needed for wheezing or shortness of breath. 75 mL 6   apixaban (ELIQUIS) 5 MG TABS tablet Take 1 tablet (5 mg total) by mouth in the morning and at bedtime. 90 tablet 2   aspirin EC 81 MG tablet Take 81 mg by mouth daily. Swallow whole.     atorvastatin (LIPITOR) 40 MG tablet TAKE 1 TABLET(40 MG) BY MOUTH AT BEDTIME 90 tablet 3   Budeson-Glycopyrrol-Formoterol (BREZTRI AEROSPHERE) 160-9-4.8 MCG/ACT AERO Inhale 2 puffs into the lungs in the morning and at bedtime. 10.7 g 11   carvedilol (COREG) 25 MG tablet Take 12.5 mg by mouth in the morning and at bedtime.     furosemide (LASIX) 40 MG tablet Take 1 tablet (40 mg total) by mouth as needed. 30 tablet 0   lamoTRIgine (LAMICTAL) 100 MG tablet Take by mouth.     Lidocaine HCl-Benzyl Alcohol (SALONPAS LIDOCAINE PLUS EX) Place 1 patch onto the skin daily as needed (pain.).     Multiple Vitamin (MULTI-VITAMIN) tablet Take 1 tablet by mouth daily.     sacubitril-valsartan (ENTRESTO) 49-51 MG Take 0.5 tablets by mouth in the morning and at bedtime.     spironolactone (ALDACTONE) 25 MG tablet Take 12.5 mg by mouth at bedtime.     tamsulosin (FLOMAX) 0.4 MG CAPS capsule Take 1 capsule (0.4 mg total) by mouth daily. 90 capsule 2   No current facility-administered medications for this visit.    Past Medical History:  Diagnosis Date  AAA (abdominal aortic aneurysm) ([place]) 2020   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 -plan medical managmment with tight bp control   Anemia 02/09/2020   Anxiety    Benign essential HTN 02/10/2020   BPH (benign prostatic hyperplasia) 02/09/2020   Brain aneurysm    Cancer (HCC)    CHF (congestive heart failure) (HCC)    Chronic kidney disease    COPD (chronic obstructive pulmonary disease) (HCC)    Coronary artery disease    Depression    Dysrhythmia    Emphysema of lung (HCC)    History of common carotid artery stent placement    Hypertension    IDA (iron deficiency anemia) 02/09/2020    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   Marijuana use, continuous 02/10/2020   Osteoporosis    PAF (paroxysmal atrial fibrillation) (HCC)    Pulmonary emboli ([place]) 06/07/2017   Right lower lobe pulmonary embolism small segmental, multifocal multifocal pneumonia, mediastinal lymphadenopathy, moderate centrilobular emphysema   Seizures (HCC)    Stroke (HCC)    left sided weakness to arm and leg    Past Surgical History:  Procedure Laterality Date   brain aneurysm with clip     COLONOSCOPY WITH PROPOFOL N/A 07/23/2020   Procedure: COLONOSCOPY WITH PROPOFOL;  Surgeon: Lin Landsman, MD;  Location: [facility] ENDOSCOPY;  Service: Gastroenterology;  Laterality: N/A;   CORONARY ANGIOPLASTY WITH STENT PLACEMENT  07/08/2017   hip repleacement Right    Right total hip arthroplasty   VIDEO BRONCHOSCOPY WITH ENDOBRONCHIAL NAVIGATION N/A 04/23/2020   Procedure: VIDEO BRONCHOSCOPY WITH ENDOBRONCHIAL NAVIGATION;  Surgeon: Tyler Pita, MD;  Location: [facility] ORS;  Service: Pulmonary;  Laterality: N/A;     Social History   Tobacco Use   Smoking status: Former    Packs/day: 2.00    Years: 53.00    Pack years: 106.00    Types: Cigarettes    Quit date: 05/28/2017    Years since quitting: 3.6   Smokeless tobacco: Never   Tobacco comments:    Quit in 2018  Vaping Use   Vaping Use: Former   Quit date: 01/15/2020   Devices: cbd   Substance Use Topics   Alcohol use: Yes    Comment: socially drink cocktail   Drug use: Yes    Types: Marijuana    Comment: last smoke x1 month ago      Family History  Problem Relation Age of Onset   Diabetes Mother    Heart disease Mother    Stroke Father    Heart disease Father    Heart attack Father    Alcohol abuse Father    Heart disease Sister    Heart attack Sister    Heart disease Maternal Grandmother    Heart attack Maternal Grandmother    Heart attack Paternal Grandmother    Diabetes Brother    Heart disease Brother    Heart attack  Brother      No Known Allergies    REVIEW OF SYSTEMS (Negative unless checked)   Constitutional: _0 Weight loss  _1 Fever  _2 Chills Cardiac: _3 Chest pain   _4 Chest pressure   _5 Palpitations   _6 Shortness of breath when laying flat   _7 Shortness of breath at rest   _8 Shortness of breath with exertion. Vascular:  _9 Pain in legs with walking   _10 Pain in legs at rest   _11 Pain in legs when laying flat   _12 Claudication   _13 Pain in feet when walking  _14 Pain in feet at rest  _15 Pain in feet when laying  flat   _0 History of DVT   _1 Phlebitis   _2 Swelling in legs   _3 Varicose veins   _4 Non-healing ulcers Pulmonary:   _5 Uses home oxygen   _6 Productive cough   _7 Hemoptysis   _8 Wheeze  _9 COPD   _10 Asthma Neurologic:  _11 Dizziness  _12 Blackouts   _13 Seizures   _14 History of stroke   _15 History of TIA  _16 Aphasia   _17 Temporary blindness   _18 Dysphagia   _19 Weakness or numbness in arms   _20 Weakness or numbness in legs Musculoskeletal:  _21 Arthritis   _22 Joint swelling   _23 Joint pain   _24 Low back pain Hematologic:  _25 Easy bruising  _26 Easy bleeding   _27 Hypercoagulable state   _28 Anemic  _29 Hepatitis Gastrointestinal:  _30 Blood in stool   _31 Vomiting blood  _32 Gastroesophageal reflux/heartburn   _33 Abdominal pain Genitourinary:  _34 Chronic kidney disease   _35 Difficult urination  _36 Frequent urination  _37 Burning with urination   _38 Hematuria Skin:  _39 Rashes   _40 Ulcers   _41 Wounds Psychological:  _42 History of anxiety   _43  History of major depression.  Physical Examination  BP 133/74   Pulse 66   Ht _44  (1.778 m)   Wt 198 lb (89.8 kg)   BMI 28.41 kg/m  Gen:  WD/WN, NAD Head: [place]/AT, No temporalis wasting. Ear/Nose/Throat: Hearing grossly intact, nares w/o erythema or drainage Eyes: Conjunctiva clear. Sclera non-icteric Neck: Supple.  Trachea midline Pulmonary:  Good air movement, no use of accessory muscles.  Cardiac: Irregular Vascular:  Vessel Right Left  Radial Palpable Palpable                          PT  Not Palpable Not palpable  DP Trace palpable 1+ palpable   Gastrointestinal: soft, non-tender/non-distended. No guarding/reflex.  Musculoskeletal: M/S 5/5 throughout.  No deformity or atrophy.  1+ bilateral lower extremity edema. Neurologic: Sensation grossly intact in extremities.  Symmetrical.  Speech is fluent.  Psychiatric: Judgment intact, Mood & affect appropriate for pt's clinical situation. Dermatologic: No rashes or ulcers noted.  No cellulitis or open wounds.      Labs Recent Results (from the past 2160 hour(s))  Retic Panel     Status: Abnormal   Collection Time: 01/05/21 11:22 AM  Result Value Ref Range   Retic Ct Pct 2.2 0.4 - 3.1 %   RBC. 2.95 (L) 4.22 - 5.81 MIL/uL   Retic Count, Absolute 65.5 19.0 - 186.0 K/uL   Immature Retic Fract 20.8 (H) 2.3 - 15.9 %   Reticulocyte Hemoglobin 32.5 >27.9 pg    Comment:        Given the high negative predictive value of a RET-He result > 32 pg iron deficiency is essentially excluded. If this patient is anemic other etiologies should be considered. Performed at [facility], [address] [place], [place] [address]   TSH     Status: Abnormal   Collection Time: 01/05/21 11:22 AM  Result Value Ref Range   TSH 5.915 (H) 0.350 - 4.500 uIU/mL    Comment: Performed by a 3rd Generation assay with a functional sensitivity of <=0.01 uIU/mL. Performed at [facility], [address], [place], [place] [address]   Folate     Status: None   Collection Time: 01/05/21 11:22 AM  Result Value Ref Range   Folate 28.0 >5.9 ng/mL    Comment: Performed at [facility], [place],  [address]  Iron and TIBC     Status: None   Collection Time: 01/05/21 11:22 AM  Result Value Ref Range   Iron 61 45 - 182 ug/dL   TIBC 337 250 - 450 ug/dL   Saturation Ratios 18 17.9 - 39.5 %   UIBC 276 ug/dL    Comment: Performed at [facility], [address] [place], [place] [address]  Ferritin      Status: None   Collection Time: 01/05/21 11:22 AM  Result Value Ref Range   Ferritin 90 24 - 336 ng/mL    Comment: Performed at [facility], [address] [place], [place] [address]  CBC with Differential/Platelet     Status: Abnormal   Collection Time: 01/05/21 11:22 AM  Result Value Ref Range   WBC 4.3 4.0 - 10.5 K/uL   RBC 2.95 (L) 4.22 - 5.81 MIL/uL   Hemoglobin 9.0 (L) 13.0 - 17.0 g/dL   HCT 28.0 (L) 39.0 - 52.0 %   MCV 94.9 80.0 - 100.0 fL   MCH 30.5 26.0 - 34.0 pg   MCHC 32.1 30.0 - 36.0 g/dL   RDW 17.0 (H) 11.5 - 15.5 %   Platelets 144 (L) 150 - 400 K/uL   nRBC 0.0 0.0 - 0.2 %   Neutrophils Relative % 64 %   Neutro Abs 2.8 1.7 - 7.7 K/uL   Lymphocytes Relative 18 %   Lymphs Abs 0.8 0.7 - 4.0 K/uL   Monocytes Relative 13 %   Monocytes Absolute 0.6 0.1 - 1.0 K/uL   Eosinophils Relative 3 %   Eosinophils Absolute 0.1 0.0 - 0.5 K/uL   Basophils Relative 1 %   Basophils Absolute 0.0 0.0 - 0.1 K/uL   Immature Granulocytes 1 %   Abs Immature Granulocytes 0.02 0.00 - 0.07 K/uL    Comment: Performed at [facility], [address] [place], [place] [address]  Basic metabolic panel     Status: Abnormal   Collection Time: 01/27/21  5:24 PM  Result Value Ref Range   Sodium 135 135 - 145 mmol/L   Potassium 4.7 3.5 - 5.1 mmol/L   Chloride 103 98 - 111 mmol/L   CO2 25 22 - 32 mmol/L   Glucose, Bld 98 70 - 99 mg/dL    Comment: Glucose reference range applies only to samples taken after fasting for at least 8 hours.   BUN 22 8 - 23 mg/dL   Creatinine, Ser 1.44 (H) 0.61 - 1.24 mg/dL   Calcium 9.0 8.9 - 10.3 mg/dL   GFR, Estimated 52 (L) >60 mL/min    Comment: (NOTE) Calculated using the CKD-EPI Creatinine Equation (2021)    Anion gap 7 5 - 15    Comment: Performed at [facility], [address] [place], [place] [address]  CBC with Differential     Status: Abnormal   Collection Time: 01/27/21  5:24 PM  Result Value Ref Range   WBC 3.9 (L)  4.0 - 10.5 K/uL   RBC 2.39 (L) 4.22 - 5.81 MIL/uL   Hemoglobin 7.5 (L) 13.0 - 17.0 g/dL   HCT 23.2 (L) 39.0 - 52.0 %   MCV 97.1 80.0 - 100.0 fL   MCH 31.4 26.0 - 34.0 pg   MCHC 32.3 30.0 - 36.0 g/dL   RDW 15.9 (H) 11.5 - 15.5 %   Platelets 142 (L) 150 - 400 K/uL   nRBC 0.0 0.0 - 0.2 %   Neutrophils Relative % 70 %   Neutro Abs 2.8 1.7 - 7.7 K/uL   Lymphocytes Relative 18 %   Lymphs Abs  0.7 0.7 - 4.0 K/uL   Monocytes Relative 8 %   Monocytes Absolute 0.3 0.1 - 1.0 K/uL   Eosinophils Relative 3 %   Eosinophils Absolute 0.1 0.0 - 0.5 K/uL   Basophils Relative 0 %   Basophils Absolute 0.0 0.0 - 0.1 K/uL   Immature Granulocytes 1 %   Abs Immature Granulocytes 0.02 0.00 - 0.07 K/uL    Comment: Performed at [facility], [address] [place], [place] [address]  Technologist smear review     Status: None   Collection Time: 02/03/21 11:44 AM  Result Value Ref Range   WBC MORPHOLOGY HYPOCHROMIA    RBC MORPHOLOGY MORPHOLOGY UNREMARKABLE     Comment: POLYCHROMASIA PRESENT   Tech Review PLATELETS APPEAR ADEQUATE     Comment: Normal platelet morphology Performed at [facility], [address] [place], [place] [address]   Reticulocytes     Status: Abnormal   Collection Time: 02/03/21 11:46 AM  Result Value Ref Range   Retic Ct Pct 2.6 0.4 - 3.1 %   RBC. 2.47 (L) 4.22 - 5.81 MIL/uL   Retic Count, Absolute 65.2 19.0 - 186.0 K/uL   Immature Retic Fract 22.0 (H) 2.3 - 15.9 %    Comment: Performed at [facility], [address], [place], [place] [address]  Folate     Status: None   Collection Time: 02/03/21 11:46 AM  Result Value Ref Range   Folate 21.4 >5.9 ng/mL    Comment: Performed at [facility], [address] [place], [place] [address]  Iron and TIBC     Status: Abnormal   Collection Time: 02/03/21 11:46 AM  Result Value Ref Range   Iron 49 45 - 182 ug/dL   TIBC 323 250 - 450 ug/dL   Saturation Ratios 15 (L) 17.9 - 39.5 %   UIBC 274  ug/dL    Comment: Performed at [facility], [address] [place], [place] [address]  Ferritin     Status: None   Collection Time: 02/03/21 11:46 AM  Result Value Ref Range   Ferritin 81 24 - 336 ng/mL    Comment: Performed at [facility], [address] [place], [place] [address]  Lactate dehydrogenase     Status: None   Collection Time: 02/03/21 11:46 AM  Result Value Ref Range   LDH 130 98 - 192 U/L    Comment: Performed at [facility], [address] [place], [place] [address]  Comprehensive metabolic panel     Status: Abnormal   Collection Time: 02/03/21 11:46 AM  Result Value Ref Range   Sodium 137 135 - 145 mmol/L   Potassium 5.5 (H) 3.5 - 5.1 mmol/L   Chloride 106 98 - 111 mmol/L   CO2 24 22 - 32 mmol/L   Glucose, Bld 91 70 - 99 mg/dL    Comment: Glucose reference range applies only to samples taken after fasting for at least 8 hours.   BUN 26 (H) 8 - 23 mg/dL   Creatinine, Ser 1.45 (H) 0.61 - 1.24 mg/dL   Calcium 9.1 8.9 - 10.3 mg/dL   Total Protein 7.3 6.5 - 8.1 g/dL   Albumin 3.6 3.5 - 5.0 g/dL   AST 19 15 - 41 U/L   ALT 13 0 - 44 U/L   Alkaline Phosphatase 102 38 - 126 U/L   Total Bilirubin 0.5 0.3 - 1.2 mg/dL   GFR, Estimated 52 (L) >60 mL/min  Comment: (NOTE) Calculated using the CKD-EPI Creatinine Equation (2021)    Anion gap 7 5 - 15    Comment: Performed at [facility], [address] [place], [place] [address]  CBC with Differential/Platelet     Status: Abnormal   Collection Time: 02/03/21 11:46 AM  Result Value Ref Range   WBC 4.3 4.0 - 10.5 K/uL   RBC 2.48 (L) 4.22 - 5.81 MIL/uL   Hemoglobin 7.5 (L) 13.0 - 17.0 g/dL   HCT 24.8 (L) 39.0 - 52.0 %   MCV 100.0 80.0 - 100.0 fL   MCH 30.2 26.0 - 34.0 pg   MCHC 30.2 30.0 - 36.0 g/dL   RDW 15.9 (H) 11.5 - 15.5 %   Platelets 155 150 - 400 K/uL   nRBC 0.0 0.0 - 0.2 %   Neutrophils Relative % 73 %   Neutro Abs 3.2 1.7 - 7.7 K/uL   Lymphocytes Relative 15 %    Lymphs Abs 0.7 0.7 - 4.0 K/uL   Monocytes Relative 9 %   Monocytes Absolute 0.4 0.1 - 1.0 K/uL   Eosinophils Relative 3 %   Eosinophils Absolute 0.1 0.0 - 0.5 K/uL   Basophils Relative 0 %   Basophils Absolute 0.0 0.0 - 0.1 K/uL   Immature Granulocytes 0 %   Abs Immature Granulocytes 0.01 0.00 - 0.07 K/uL    Comment: Performed at [facility], [address] [place], [place] [address]  DAT, polyspecific, A[facility] only)     Status: None   Collection Time: 02/03/21 11:48 AM  Result Value Ref Range   Polyspecific AHG test      NEG Performed at [facility], [address] [place], [place] [address]     Radiology CT Angio Aortobifemoral W and/or Wo Contrast  Result Date: 01/27/2021 CLINICAL DATA:  Lower extremity pain and swelling, decreased capillary refill, right calf cramping EXAM: CT ANGIOGRAPHY OF ABDOMINAL AORTA WITH ILIOFEMORAL RUNOFF TECHNIQUE: Multidetector CT imaging of the abdomen, pelvis and lower extremities was performed using the standard protocol during bolus administration of intravenous contrast. Multiplanar CT image reconstructions and MIPs were obtained to evaluate the vascular anatomy. CONTRAST:  129m OMNIPAQUE IOHEXOL 350 MG/ML SOLN COMPARISON:  None. FINDINGS: VASCULAR Aorta: The supra celiac and pararenal aorta are of normal caliber. An fusiform infrarenal abdominal aortic aneurysm is present with maximal transaxial dimensions of 4.1 x 4.2 cm (coronal # 61, sagittal # 103), terminating at the aortic bifurcation. A penetrating atherosclerotic ulcer is seen involving the left anteromedial aortic wall at the level of the origin of the celiac axis measuring 5 x 8 mm in greatest dimension. No evidence of rupture. No intramural hematoma. Moderate mixed atherosclerotic plaque. No evidence of hemodynamically significant stenosis. No dissection. Celiac: Widely patent. Normal anatomic configuration. No aneurysm or dissection. SMA: Less than 50% stenosis of the  origin. Otherwise widely patent. No aneurysm or dissection. Renals: Single renal arteries bilaterally. Left renal artery demonstrates normal morphology. Focal dissection involving the proximal right renal artery results in a greater than 75% stenosis of the vessel, best seen on axial # 50/coronal # 65. The vessels are otherwise widely patent. IMA: Occluded at its origin.  Reconstituted by the marginal artery. RIGHT Lower Extremity Inflow: Multifocal mixed atherosclerotic plaque results in a focal 50% stenosis of the right common iliac artery and proximal right external iliac artery. Focal short segment dissection noted within the right external iliac artery, axial # 109, though this does not appear flow limiting. Internal  iliac artery demonstrates a greater than 75% stenosis at its origin. Outflow: Moderate atherosclerotic plaque within the common femoral artery, less than 50% stenosis. Calcified atherosclerotic plaque results in a roughly 50% stenosis of the profundus femoral artery at its origin. Right superficial femoral artery demonstrates scattered mixed atherosclerotic plaque with a roughly 2.5 cm 50% stenosis involving the distal SFA secondary to eccentric noncalcified plaque. Runoff: Focal 50-75% stenosis of the P2 segment of the popliteal artery secondary to largely noncalcified plaque. Estimated 50% stenosis of the anterior tibial artery at its origin. Three-vessel runoff to the right foot. Patency of the dorsalis pedis artery and posterior tibial artery. LEFT Lower Extremity Inflow: Scattered mixed plaque results in a focal 50-75% stenosis of the left external iliac artery beyond the bifurcation. Internal iliac artery is patent at its origin but is heavily diseased. Outflow: Less than 50% stenosis of the common femoral artery. Less than 50% stenosis of the profundus femoral artery at its origin. Scattered atherosclerotic plaque throughout the superficial femoral artery without evidence of hemodynamically  significant stenosis. Runoff: Less than 50% stenosis of the P1 segment of the popliteal artery secondary to mixed plaque. Posterior tibial artery occludes shortly beyond its takeoff. Two vessel runoff to the left foot. Peroneal artery reconstitutes the plantar arch. Dorsalis pedis artery is patent. Veins: Not optimally opacified on this examination. Retroaortic left renal vein noted. Review of the MIP images confirms the above findings. NON-VASCULAR Lower chest: Visualized lung bases are clear. Moderate right coronary artery calcification. Hepatobiliary: No focal liver abnormality is seen. No gallstones, gallbladder wall thickening, or biliary dilatation. Pancreas: Unremarkable Spleen: Unremarkable Adrenals/Urinary Tract: The adrenal glands are unremarkable. The kidneys are normal in position. Mild relative right renal cortical atrophy. Simple cortical cyst noted within the upper pole of the right kidney. No intrarenal masses or calcifications are seen. No hydronephrosis. Bladder unremarkable. Stomach/Bowel: Severe sigmoid diverticulosis. Stomach, small bowel, and large bowel are otherwise unremarkable. No free intraperitoneal gas or fluid. Lymphatic: No pathologic adenopathy within the abdomen and pelvis. Reproductive: Mild prostatic enlargement. Other: No abdominal wall hernia. 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 No acute bone abnormality. Osseous structures are age-appropriate. IMPRESSION: VASCULAR 4.2 cm infrarenal abdominal aortic aneurysm. Recommend follow-up every 12 months and vascular consultation. This recommendation follows ACR consensus guidelines: White Paper of the ACR Incidental Findings Committee II on Vascular Findings. J Am Coll Radiol 2013; 10:789-794. 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 No intramural hematoma or rupture. Focal dissection involving the proximal, solitary right renal artery resulting in a greater than 75%  stenosis. Occlusion of the inferior mesenteric artery with reconstitution by the marginal artery. Multiple serial stenoses of the right lower extremity arterial inflow, outflow, and runoff with serial 50% stenoses of the right lower extremity arterial inflow,, tandem stenoses of the profundus femoral artery origin and distal right SFA, and focal stenosis of the a P2 segment of the right popliteal artery. Superimposed short segment non flow limiting dissection of the right external iliac artery. Three-vessel runoff to the right foot. Multiple serial stenoses involving the left lower extremity arterial inflow and runoff including a focal 50% stenosis of the left external iliac artery and occlusion of the left posterior tibial artery. Two vessel runoff to the left foot. NON-VASCULAR Severe sigmoid diverticulosis. Electronically Signed   By: Fidela Salisbury MD   On: 01/27/2021 19:47   US Venous Img Lower Unilateral Right  Result Date: 01/27/2021 CLINICAL DATA:  Right calf pain and swelling.  EXAM: RIGHT LOWER EXTREMITY VENOUS DOPPLER ULTRASOUND TECHNIQUE: Gray-scale sonography with graded compression, as well as color Doppler and duplex ultrasound were performed to evaluate the lower extremity deep venous systems from the level of the common femoral vein and including the common femoral, femoral, profunda femoral, popliteal and calf veins including the posterior tibial, peroneal and gastrocnemius veins when visible. The superficial great saphenous vein was also interrogated. Spectral Doppler was utilized to evaluate flow at rest and with distal augmentation maneuvers in the common femoral, femoral and popliteal veins. COMPARISON:  06/23/2020 FINDINGS: Contralateral Common Femoral Vein: Respiratory phasicity is normal and symmetric with the symptomatic side. No evidence of thrombus. Normal compressibility. Common Femoral Vein: No evidence of thrombus. Normal compressibility, respiratory phasicity and response to  augmentation. Saphenofemoral Junction: No evidence of thrombus. Normal compressibility and flow on color Doppler imaging. Profunda Femoral Vein: No evidence of thrombus. Normal compressibility and flow on color Doppler imaging. Femoral Vein: No evidence of thrombus. Normal compressibility, respiratory phasicity and response to augmentation. Popliteal Vein: No evidence of thrombus. Normal compressibility, respiratory phasicity and response to augmentation. Calf Veins: No evidence of thrombus. Normal compressibility and flow on color Doppler imaging. Other Findings: Area pain in the lateral right calf was imaged. There is a poorly defined hypoechoic collection that measures 6.8 x 1.5 x 1.3 cm in the superficial soft tissues. This hypoechoic collection is heterogeneous without internal vascularity. IMPRESSION: 1.  Negative for deep venous thrombosis in right lower extremity. 2. Complex hypoechoic collection along the lateral right calf at the area of concern. This is compatible with a complex fluid collection and could represent a hematoma. Electronically Signed   By: Markus Daft M.D.   On: 01/27/2021 13:21    Assessment/Plan Benign essential HTN blood pressure control important in reducing the progression of atherosclerotic disease and aneurysmal growth. On appropriate oral medications.     Pre-diabetes blood glucose control important in reducing the progression of atherosclerotic disease. Also, involved in wound healing. On appropriate medications.     HLD (hyperlipidemia) lipid control important in reducing the progression of atherosclerotic disease. Continue statin therapy  AAA (abdominal aortic aneurysm) ([place]) From a size standpoint, this was stable at 4.2 cm on his recent CT scan.  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  We discussed with the patient and his wife we can consider repair of the aneurysm concomitant iliac artery intervention for  occlusive disease.  This would essentially kill 2 birds with 1 stone although he still has infrainguinal occlusive disease as well.  We can potentially address that as well, although we may have to consider an Endologix stent graft or other stent graft if we are planning to go up and over the aortic bifurcation to treat this after his aneurysm repair.  They want to go home and think about their options and determine whether or not they want to proceed.  They already have a follow-up for about 2 months from now if they do not decide to have any intervention at this time.  Atherosclerosis of native arteries of extremity with intermittent claudication (HCC) Significant multilevel disease as described above which is significantly symptomatic.  The patient is considering whether or not he wants to have intervention.    [name], MD  02/03/2021 2:31 PM    This note was created with Dragon medical transcription system.  Any errors from dictation are purely unintentional

## 2021-02-04 ENCOUNTER — Encounter: Payer: Self-pay | Admitting: Oncology

## 2021-02-04 ENCOUNTER — Telehealth: Payer: Self-pay | Admitting: *Deleted

## 2021-02-04 ENCOUNTER — Other Ambulatory Visit: Payer: Self-pay | Admitting: Oncology

## 2021-02-04 ENCOUNTER — Telehealth: Payer: Self-pay | Admitting: Oncology

## 2021-02-04 DIAGNOSIS — D508 Other iron deficiency anemias: Secondary | ICD-10-CM

## 2021-02-04 DIAGNOSIS — D509 Iron deficiency anemia, unspecified: Secondary | ICD-10-CM | POA: Insufficient documentation

## 2021-02-04 LAB — HAPTOGLOBIN: Haptoglobin: 239 mg/dL (ref 32–363)

## 2021-02-04 LAB — THYROID PANEL WITH TSH
Free Thyroxine Index: 1.5 (ref 1.2–4.9)
T3 Uptake Ratio: 27 % (ref 24–39)
T4, Total: 5.7 ug/dL (ref 4.5–12.0)
TSH: 5.72 u[IU]/mL — ABNORMAL HIGH (ref 0.450–4.500)

## 2021-02-04 NOTE — Telephone Encounter (Signed)
Patient wife called stating that they got a call yesterday for appointments, but no one explained why or the lab results to them and she would like a return call to discuss results and diagnosis

## 2021-02-04 NOTE — Telephone Encounter (Signed)
Sonia Baller will you please call?

## 2021-02-04 NOTE — Telephone Encounter (Signed)
Re: Anemia  Spoke with patient's wife regarding labs and IV iron infusions. She is ok with IV iron infusion but had questions about why he was becoming more anemic.   Explained that we will give him 5 IV iron infusions given his iron saturations are low and ferritin is below 100. He is not actively bleeding. We will recheck labs in about 2 months and see if things have improved.   All questions answered.   Faythe Casa, NP 02/04/2021 8:44 PM

## 2021-02-04 NOTE — Telephone Encounter (Signed)
Received VM from patient's wife that they would like to know what is going on. They were called with appts, but nothing about what is going on has been explained to them. They would like someone to contact them and let them know what they need to expect and what he is facing.

## 2021-02-05 ENCOUNTER — Encounter: Payer: Self-pay | Admitting: Oncology

## 2021-02-07 LAB — MULTIPLE MYELOMA PANEL, SERUM
Albumin SerPl Elph-Mcnc: 3.2 g/dL (ref 2.9–4.4)
Albumin/Glob SerPl: 1.1 (ref 0.7–1.7)
Alpha 1: 0.3 g/dL (ref 0.0–0.4)
Alpha2 Glob SerPl Elph-Mcnc: 0.7 g/dL (ref 0.4–1.0)
B-Globulin SerPl Elph-Mcnc: 0.8 g/dL (ref 0.7–1.3)
Gamma Glob SerPl Elph-Mcnc: 1.3 g/dL (ref 0.4–1.8)
Globulin, Total: 3.1 g/dL (ref 2.2–3.9)
IgA: 129 mg/dL (ref 61–437)
IgG (Immunoglobin G), Serum: 1432 mg/dL (ref 603–1613)
IgM (Immunoglobulin M), Srm: 138 mg/dL (ref 20–172)
Total Protein ELP: 6.3 g/dL (ref 6.0–8.5)

## 2021-02-09 ENCOUNTER — Inpatient Hospital Stay: Payer: Medicare Other

## 2021-02-09 ENCOUNTER — Other Ambulatory Visit: Payer: Self-pay

## 2021-02-09 ENCOUNTER — Inpatient Hospital Stay: Payer: Medicare Other | Attending: Oncology

## 2021-02-09 VITALS — BP 125/59 | HR 63 | Temp 97.5°F | Resp 18

## 2021-02-09 DIAGNOSIS — D509 Iron deficiency anemia, unspecified: Secondary | ICD-10-CM | POA: Diagnosis not present

## 2021-02-09 DIAGNOSIS — D508 Other iron deficiency anemias: Secondary | ICD-10-CM

## 2021-02-09 MED ORDER — SODIUM CHLORIDE 0.9 % IV SOLN: 200.0000 mg | INTRAVENOUS | Status: DC

## 2021-02-09 MED ORDER — IRON SUCROSE 20 MG/ML IV SOLN
200.0000 mg | Freq: Once | INTRAVENOUS | Status: AC
  Administered 2021-02-09: 200 mg via INTRAVENOUS
  Filled 2021-02-09: qty 10

## 2021-02-09 MED ORDER — SODIUM CHLORIDE 0.9 % IV SOLN
Freq: Once | INTRAVENOUS | Status: AC
  Filled 2021-02-09: qty 250

## 2021-02-10 NOTE — Progress Notes (Signed)
Labs are back for [name]. He scheduled iron per our discussion and to see you back in 2 months.   Faythe Casa, NP 02/10/2021 10:10 AM

## 2021-02-11 ENCOUNTER — Inpatient Hospital Stay: Payer: Medicare Other

## 2021-02-11 VITALS — BP 113/61 | HR 63 | Temp 97.0°F | Resp 18

## 2021-02-11 DIAGNOSIS — D508 Other iron deficiency anemias: Secondary | ICD-10-CM

## 2021-02-11 DIAGNOSIS — D509 Iron deficiency anemia, unspecified: Secondary | ICD-10-CM | POA: Diagnosis not present

## 2021-02-11 MED ORDER — IRON SUCROSE 20 MG/ML IV SOLN
200.0000 mg | Freq: Once | INTRAVENOUS | Status: AC
  Administered 2021-02-11: 200 mg via INTRAVENOUS
  Filled 2021-02-11: qty 10

## 2021-02-11 MED ORDER — SODIUM CHLORIDE 0.9 % IV SOLN: 200.0000 mg | INTRAVENOUS | Status: DC

## 2021-02-11 MED ORDER — SODIUM CHLORIDE 0.9 % IV SOLN
Freq: Once | INTRAVENOUS | Status: AC
  Filled 2021-02-11: qty 250

## 2021-02-11 NOTE — Patient Instructions (Signed)

## 2021-02-12 ENCOUNTER — Encounter: Payer: Self-pay | Admitting: Podiatry

## 2021-02-12 ENCOUNTER — Other Ambulatory Visit: Payer: Self-pay

## 2021-02-12 ENCOUNTER — Ambulatory Visit: Payer: Medicare Other | Admitting: Podiatry

## 2021-02-12 DIAGNOSIS — Z7901 Long term (current) use of anticoagulants: Secondary | ICD-10-CM | POA: Diagnosis not present

## 2021-02-12 DIAGNOSIS — M79675 Pain in left toe(s): Secondary | ICD-10-CM

## 2021-02-12 DIAGNOSIS — B351 Tinea unguium: Secondary | ICD-10-CM | POA: Diagnosis not present

## 2021-02-12 DIAGNOSIS — M79674 Pain in right toe(s): Secondary | ICD-10-CM | POA: Diagnosis not present

## 2021-02-12 NOTE — Progress Notes (Signed)
This patient returns to my office for at risk foot care.  This patient requires this care by a professional since this patient will be at risk due to having coagulation defect and PAD.  This patient is unable to cut nails himself since the patient cannot reach his nails.These nails are painful walking and wearing shoes.  This patient presents for at risk foot care today.  Patient presents to the office with his wife.  General Appearance  Alert, conversant and in no acute stress.  Vascular  Dorsalis pedis and posterior tibial  pulses are palpable  bilaterally.  Capillary return is within normal limits  bilaterally. Temperature is within normal limits  bilaterally.  Neurologic  Senn-Weinstein monofilament wire test within normal limits  bilaterally. Muscle power within normal limits bilaterally.  Nails Thick disfigured discolored nails with subungual debris  hallux nails bilaterally. No evidence of bacterial infection or drainage bilaterally. Hallux pincer nails.  Orthopedic  No limitations of motion  feet .  No crepitus or effusions noted.  No bony pathology or digital deformities noted.  Skin  normotropic skin with no porokeratosis noted bilaterally.  No signs of infections or ulcers noted.     Onychomycosis  Pain in right toes  Pain in left toes  Consent was obtained for treatment procedures.   Mechanical debridement of nails 1-5  bilaterally performed with a nail nipper.  Filed with dremel without incident.    Return office visit    3 months                 Told patient to return for periodic foot care and evaluation due to potential at risk complications.   Gardiner Barefoot DPM

## 2021-02-13 ENCOUNTER — Inpatient Hospital Stay: Payer: Medicare Other

## 2021-02-13 VITALS — BP 112/72 | HR 88 | Temp 96.8°F | Resp 18

## 2021-02-13 DIAGNOSIS — D508 Other iron deficiency anemias: Secondary | ICD-10-CM

## 2021-02-13 DIAGNOSIS — D509 Iron deficiency anemia, unspecified: Secondary | ICD-10-CM | POA: Diagnosis not present

## 2021-02-13 MED ORDER — IRON SUCROSE 20 MG/ML IV SOLN
200.0000 mg | Freq: Once | INTRAVENOUS | Status: AC
  Administered 2021-02-13: 200 mg via INTRAVENOUS
  Filled 2021-02-13: qty 10

## 2021-02-13 MED ORDER — SODIUM CHLORIDE 0.9 % IV SOLN: 200.0000 mg | INTRAVENOUS | Status: DC

## 2021-02-13 MED ORDER — SODIUM CHLORIDE 0.9 % IV SOLN
Freq: Once | INTRAVENOUS | Status: AC
  Filled 2021-02-13: qty 250

## 2021-02-13 NOTE — Patient Instructions (Signed)

## 2021-02-16 ENCOUNTER — Inpatient Hospital Stay: Payer: Medicare Other

## 2021-02-16 ENCOUNTER — Other Ambulatory Visit: Payer: Self-pay | Admitting: Family Medicine

## 2021-02-16 VITALS — BP 107/62 | HR 67 | Temp 97.2°F | Resp 18

## 2021-02-16 DIAGNOSIS — I6529 Occlusion and stenosis of unspecified carotid artery: Secondary | ICD-10-CM

## 2021-02-16 DIAGNOSIS — D509 Iron deficiency anemia, unspecified: Secondary | ICD-10-CM | POA: Diagnosis not present

## 2021-02-16 DIAGNOSIS — D508 Other iron deficiency anemias: Secondary | ICD-10-CM

## 2021-02-16 DIAGNOSIS — I639 Cerebral infarction, unspecified: Secondary | ICD-10-CM

## 2021-02-16 DIAGNOSIS — I480 Paroxysmal atrial fibrillation: Secondary | ICD-10-CM

## 2021-02-16 MED ORDER — IRON SUCROSE 20 MG/ML IV SOLN
200.0000 mg | Freq: Once | INTRAVENOUS | Status: AC
  Administered 2021-02-16: 200 mg via INTRAVENOUS
  Filled 2021-02-16: qty 10

## 2021-02-16 MED ORDER — SODIUM CHLORIDE 0.9 % IV SOLN
Freq: Once | INTRAVENOUS | Status: AC
  Filled 2021-02-16: qty 250

## 2021-02-16 MED ORDER — SODIUM CHLORIDE 0.9 % IV SOLN: 200.0000 mg | INTRAVENOUS | Status: DC

## 2021-02-16 NOTE — Patient Instructions (Signed)
[facility]  Discharge Instructions: Thank you for choosing [facility] to provide your oncology and hematology care.  If you have a lab appointment with the Cancer Center, please go directly to the Cancer Center and check in at the registration area.  Wear comfortable clothing and clothing appropriate for easy access to any Portacath or PICC line.   We strive to give you quality time with your provider. You may need to reschedule your appointment if you arrive late (15 or more minutes).  Arriving late affects you and other patients whose appointments are after yours.  Also, if you miss three or more appointments without notifying the office, you may be dismissed from the clinic at the provider's discretion.      For prescription refill requests, have your pharmacy contact our office and allow 72 hours for refills to be completed.    Today you received the following chemotherapy and/or immunotherapy agents venofer       To help prevent nausea and vomiting after your treatment, we encourage you to take your nausea medication as directed.  BELOW ARE SYMPTOMS THAT SHOULD BE REPORTED IMMEDIATELY: *FEVER GREATER THAN 100.4 F (38 C) OR HIGHER *CHILLS OR SWEATING *NAUSEA AND VOMITING THAT IS NOT CONTROLLED WITH YOUR NAUSEA MEDICATION *UNUSUAL SHORTNESS OF BREATH *UNUSUAL BRUISING OR BLEEDING *URINARY PROBLEMS (pain or burning when urinating, or frequent urination) *BOWEL PROBLEMS (unusual diarrhea, constipation, pain near the anus) TENDERNESS IN MOUTH AND THROAT WITH OR WITHOUT PRESENCE OF ULCERS (sore throat, sores in mouth, or a toothache) UNUSUAL RASH, SWELLING OR PAIN  UNUSUAL VAGINAL DISCHARGE OR ITCHING   Items with * indicate a potential emergency and should be followed up as soon as possible or go to the Emergency Department if any problems should occur.  Please show the CHEMOTHERAPY ALERT CARD or IMMUNOTHERAPY ALERT CARD at check-in  to the Emergency Department and triage nurse.  Should you have questions after your visit or need to cancel or reschedule your appointment, please contact [facility]  [phone] and follow the prompts.  Office hours are 8:00 a.m. to 4:30 p.m. Monday - Friday. Please note that voicemails left after 4:00 p.m. may not be returned until the following business day.  We are closed weekends and major holidays. You have access to a nurse at all times for urgent questions. Please call the main number to the clinic [phone] and follow the prompts.  For any non-urgent questions, you may also contact your provider using MyChart. We now offer e-Visits for anyone 18 and older to request care online for non-urgent symptoms. For details visit [email or web address].   Also download the MyChart app! Go to the app store, search "MyChart", open the app, select [facility], and log in with your MyChart username and password.  Due to Covid, a mask is required upon entering the hospital/clinic. If you do not have a mask, one will be given to you upon arrival. For doctor visits, patients may have 1 support person aged 18 or older with them. For treatment visits, patients cannot have anyone with them due to current Covid guidelines and our immunocompromised population.   Iron Sucrose injection What is this medication? IRON SUCROSE (AHY ern SOO krohs) is an iron complex. Iron is used to make healthy red blood cells, which carry oxygen and nutrients throughout the body. This medicine is used to treat iron deficiency anemia in people with chronickidney disease. This medicine may be used for   other purposes; ask your health care provider orpharmacist if you have questions. COMMON BRAND NAME(S): Venofer What should I tell my care team before I take this medication? They need to know if you have any of these conditions: anemia not caused by low iron levels heart disease high levels of  iron in the blood kidney disease liver disease an unusual or allergic reaction to iron, other medicines, foods, dyes, or preservatives pregnant or trying to get pregnant breast-feeding How should I use this medication? This medicine is for infusion into a vein. It is given by a health careprofessional in a hospital or clinic setting. Talk to your pediatrician regarding the use of this medicine in children. While this drug may be prescribed for children as young as 2 years for selectedconditions, precautions do apply. Overdosage: If you think you have taken too much of this medicine contact apoison control center or emergency room at once. NOTE: This medicine is only for you. Do not share this medicine with others. What if I miss a dose? It is important not to miss your dose. Call your doctor or health careprofessional if you are unable to keep an appointment. What may interact with this medication? Do not take this medicine with any of the following medications: deferoxamine dimercaprol other iron products This medicine may also interact with the following medications: chloramphenicol deferasirox This list may not describe all possible interactions. Give your health care provider a list of all the medicines, herbs, non-prescription drugs, or dietary supplements you use. Also tell them if you smoke, drink alcohol, or use illegaldrugs. Some items may interact with your medicine. What should I watch for while using this medication? Visit your doctor or healthcare professional regularly. Tell your doctor or healthcare professional if your symptoms do not start to get better or if theyget worse. You may need blood work done while you are taking this medicine. You may need to follow a special diet. Talk to your doctor. Foods that contain iron include: whole grains/cereals, dried fruits, beans, or peas, leafy greenvegetables, and organ meats (liver, kidney). What side effects may I notice from  receiving this medication? Side effects that you should report to your doctor or health care professionalas soon as possible: allergic reactions like skin rash, itching or hives, swelling of the face, lips, or tongue breathing problems changes in blood pressure cough fast, irregular heartbeat feeling faint or lightheaded, falls fever or chills flushing, sweating, or hot feelings joint or muscle aches/pains seizures swelling of the ankles or feet unusually weak or tired Side effects that usually do not require medical attention (report to yourdoctor or health care professional if they continue or are bothersome): diarrhea feeling achy headache irritation at site where injected nausea, vomiting stomach upset tiredness This list may not describe all possible side effects. Call your doctor for medical advice about side effects. You may report side effects to FDA at1-800-FDA-1088. Where should I keep my medication? This drug is given in a hospital or clinic and will not be stored at home. NOTE: This sheet is a summary. It may not cover all possible information. If you have questions about this medicine, talk to your doctor, pharmacist, orhealth care provider.  2022 Elsevier/Gold Standard (2011-04-08 17:14:35)  

## 2021-02-16 NOTE — Telephone Encounter (Signed)
   Notes to clinic:  ZERO refills remain on this prescription. Your patient is requesting advance approval of refills for this medication to [facility]   Requested Prescriptions  Pending Prescriptions Disp Refills   ELIQUIS 5 MG TABS tablet [Pharmacy Med Name: ELIQUIS 5MG  TABLETS] 90 tablet 2    Sig: TAKE 1 TABLET(5 MG) BY MOUTH IN THE MORNING AND AT BEDTIME      Hematology:  Anticoagulants Failed - 02/16/2021  8:00 AM      Failed - HGB in normal range and within 360 days    Hemoglobin  Date Value Ref Range Status  02/03/2021 7.5 (L) 13.0 - 17.0 g/dL Final          Failed - HCT in normal range and within 360 days    HCT  Date Value Ref Range Status  02/03/2021 24.8 (L) 39.0 - 52.0 % Final          Failed - Cr in normal range and within 360 days    Creatinine, Ser  Date Value Ref Range Status  02/03/2021 1.45 (H) 0.61 - 1.24 mg/dL Final          Passed - PLT in normal range and within 360 days    Platelets  Date Value Ref Range Status  02/03/2021 155 150 - 400 K/uL Final          Passed - Valid encounter within last 12 months    Recent Outpatient Visits           2 weeks ago    [facility], Vickki Muff, PA-C   3 months ago Onychomycosis of left great toe   [facility], Vickki Muff, PA-C   6 months ago Encounter to establish care with new doctor   [facility], Vickki Muff, PA-C       Future Appointments             In 3 weeks Sharolyn Douglas, Clance Boll, NP [facility], [place]

## 2021-02-17 ENCOUNTER — Encounter: Payer: Self-pay | Admitting: Oncology

## 2021-02-18 ENCOUNTER — Inpatient Hospital Stay: Payer: Medicare Other

## 2021-02-18 ENCOUNTER — Encounter: Payer: Self-pay | Admitting: Oncology

## 2021-02-18 ENCOUNTER — Other Ambulatory Visit: Payer: Self-pay

## 2021-02-18 VITALS — BP 123/68 | HR 56 | Temp 97.0°F

## 2021-02-18 DIAGNOSIS — D509 Iron deficiency anemia, unspecified: Secondary | ICD-10-CM | POA: Diagnosis not present

## 2021-02-18 DIAGNOSIS — D508 Other iron deficiency anemias: Secondary | ICD-10-CM

## 2021-02-18 MED ORDER — IRON SUCROSE 20 MG/ML IV SOLN
200.0000 mg | Freq: Once | INTRAVENOUS | Status: AC
  Administered 2021-02-18: 200 mg via INTRAVENOUS
  Filled 2021-02-18: qty 10

## 2021-02-18 MED ORDER — SODIUM CHLORIDE 0.9 % IV SOLN: 200.0000 mg | INTRAVENOUS | Status: DC

## 2021-02-18 MED ORDER — SODIUM CHLORIDE 0.9 % IV SOLN
Freq: Once | INTRAVENOUS | Status: AC
  Filled 2021-02-18: qty 250

## 2021-02-18 NOTE — Patient Instructions (Signed)
[facility]  Discharge Instructions: Thank you for choosing [facility] to provide your oncology and hematology care.  If you have a lab appointment with the [facility], please go directly to the [facility] and check in at the registration area.  Wear comfortable clothing and clothing appropriate for easy access to any Portacath or PICC line.   We strive to give you quality time with your provider. You may need to reschedule your appointment if you arrive late (15 or more minutes).  Arriving late affects you and other patients whose appointments are after yours.  Also, if you miss three or more appointments without notifying the office, you may be dismissed from the clinic at the provider's discretion.      For prescription refill requests, have your pharmacy contact our office and allow 72 hours for refills to be completed.    Today you received venofer   To help prevent nausea and vomiting after your treatment, we encourage you to take your nausea medication as directed.  BELOW ARE SYMPTOMS THAT SHOULD BE REPORTED IMMEDIATELY: *FEVER GREATER THAN 100.4 F (38 C) OR HIGHER *CHILLS OR SWEATING *NAUSEA AND VOMITING THAT IS NOT CONTROLLED WITH YOUR NAUSEA MEDICATION *UNUSUAL SHORTNESS OF BREATH *UNUSUAL BRUISING OR BLEEDING *URINARY PROBLEMS (pain or burning when urinating, or frequent urination) *BOWEL PROBLEMS (unusual diarrhea, constipation, pain near the anus) TENDERNESS IN MOUTH AND THROAT WITH OR WITHOUT PRESENCE OF ULCERS (sore throat, sores in mouth, or a toothache) UNUSUAL RASH, SWELLING OR PAIN  UNUSUAL VAGINAL DISCHARGE OR ITCHING   Items with * indicate a potential emergency and should be followed up as soon as possible or go to the Emergency Department if any problems should occur.  Please show the CHEMOTHERAPY ALERT CARD or IMMUNOTHERAPY ALERT CARD at check-in to the Emergency Department and triage nurse.  Should you  have questions after your visit or need to cancel or reschedule your appointment, please contact [facility]  [phone] and follow the prompts.  Office hours are 8:00 a.m. to 4:30 p.m. Monday - Friday. Please note that voicemails left after 4:00 p.m. may not be returned until the following business day.  We are closed weekends and major holidays. You have access to a nurse at all times for urgent questions. Please call the main number to the clinic [phone] and follow the prompts.  For any non-urgent questions, you may also contact your provider using MyChart. We now offer e-Visits for anyone 17 and older to request care online for non-urgent symptoms. For details visit [email or web address].   Also download the MyChart app! Go to the app store, search "MyChart", open the app, select [facility], and log in with your MyChart username and password.  Due to Covid, a mask is required upon entering the hospital/clinic. If you do not have a mask, one will be given to you upon arrival. For doctor visits, patients may have 1 support person aged 38 or older with them. For treatment visits, patients cannot have anyone with them due to current Covid guidelines and our immunocompromised population.

## 2021-02-20 ENCOUNTER — Encounter: Payer: Self-pay | Admitting: Oncology

## 2021-02-23 ENCOUNTER — Other Ambulatory Visit: Payer: Self-pay

## 2021-02-23 ENCOUNTER — Emergency Department: Payer: No Typology Code available for payment source

## 2021-02-23 ENCOUNTER — Emergency Department
Admission: EM | Admit: 2021-02-23 | Discharge: 2021-02-23 | Disposition: A | Discharge status: Home / Self Care | Payer: No Typology Code available for payment source | Attending: Emergency Medicine | Admitting: Emergency Medicine

## 2021-02-23 ENCOUNTER — Encounter: Payer: Self-pay | Admitting: Emergency Medicine

## 2021-02-23 DIAGNOSIS — W010XXA Fall on same level from slipping, tripping and stumbling without subsequent striking against object, initial encounter: Secondary | ICD-10-CM | POA: Diagnosis not present

## 2021-02-23 DIAGNOSIS — M25511 Pain in right shoulder: Secondary | ICD-10-CM | POA: Diagnosis not present

## 2021-02-23 DIAGNOSIS — Z7982 Long term (current) use of aspirin: Secondary | ICD-10-CM | POA: Diagnosis not present

## 2021-02-23 DIAGNOSIS — D631 Anemia in chronic kidney disease: Secondary | ICD-10-CM | POA: Insufficient documentation

## 2021-02-23 DIAGNOSIS — S63501A Unspecified sprain of right wrist, initial encounter: Secondary | ICD-10-CM

## 2021-02-23 DIAGNOSIS — I480 Paroxysmal atrial fibrillation: Secondary | ICD-10-CM | POA: Diagnosis not present

## 2021-02-23 DIAGNOSIS — I130 Hypertensive heart and chronic kidney disease with heart failure and stage 1 through stage 4 chronic kidney disease, or unspecified chronic kidney disease: Secondary | ICD-10-CM | POA: Insufficient documentation

## 2021-02-23 DIAGNOSIS — Z79899 Other long term (current) drug therapy: Secondary | ICD-10-CM | POA: Diagnosis not present

## 2021-02-23 DIAGNOSIS — M25561 Pain in right knee: Secondary | ICD-10-CM | POA: Diagnosis not present

## 2021-02-23 DIAGNOSIS — Z87891 Personal history of nicotine dependence: Secondary | ICD-10-CM | POA: Insufficient documentation

## 2021-02-23 DIAGNOSIS — N189 Chronic kidney disease, unspecified: Secondary | ICD-10-CM | POA: Diagnosis not present

## 2021-02-23 DIAGNOSIS — S6991XA Unspecified injury of right wrist, hand and finger(s), initial encounter: Secondary | ICD-10-CM | POA: Diagnosis present

## 2021-02-23 DIAGNOSIS — Z96641 Presence of right artificial hip joint: Secondary | ICD-10-CM | POA: Insufficient documentation

## 2021-02-23 DIAGNOSIS — J449 Chronic obstructive pulmonary disease, unspecified: Secondary | ICD-10-CM | POA: Insufficient documentation

## 2021-02-23 DIAGNOSIS — Z7901 Long term (current) use of anticoagulants: Secondary | ICD-10-CM | POA: Diagnosis not present

## 2021-02-23 DIAGNOSIS — I5022 Chronic systolic (congestive) heart failure: Secondary | ICD-10-CM | POA: Insufficient documentation

## 2021-02-23 DIAGNOSIS — Z85118 Personal history of other malignant neoplasm of bronchus and lung: Secondary | ICD-10-CM | POA: Insufficient documentation

## 2021-02-23 DIAGNOSIS — I2510 Atherosclerotic heart disease of native coronary artery without angina pectoris: Secondary | ICD-10-CM | POA: Diagnosis not present

## 2021-02-23 NOTE — ED Notes (Signed)
See triage note  Presents s/p fall  States slipped landed on right wrist  Good pulses

## 2021-02-23 NOTE — Discharge Instructions (Addendum)
Take Tylenol for pain as needed.  Return emergency department worsening.  Follow-up orthopedics if not better in 1 week.  Follow-up with your regular doctor as needed.  Apply ice.

## 2021-02-23 NOTE — ED Triage Notes (Signed)
First Nurse Note:  C/O slipping and falling, landing on right wrist.  C/O right wrist pain

## 2021-02-23 NOTE — ED Provider Notes (Signed)
[facility] Emergency Department Provider Note  ____________________________________________   Event Date/Time   First MD Initiated Contact with Patient 02/23/21 1009     (approximate)  I have reviewed the triage vital signs and the nursing notes.   HISTORY  Chief Complaint No chief complaint on file.    HPI Richard Wiggins is a 70 y.o. male presents to the emergency department complaining of a fall on Saturday.  Patient landed on the right hand.  He is complaining of right hand pain.  No headache, no head injury, some pain in the right knee but states he can bear weight without difficulty, wife states his right shoulder was hurting yesterday but the patient is denying pain in the right shoulder at this time    Past Medical History:  Diagnosis Date   AAA (abdominal aortic aneurysm) ([place]) 2020   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 -plan medical managmment with tight bp control   Anemia 02/09/2020   Anxiety    Benign essential HTN 02/10/2020   BPH (benign prostatic hyperplasia) 02/09/2020   Brain aneurysm    Cancer (HCC)    CHF (congestive heart failure) (HCC)    Chronic kidney disease    COPD (chronic obstructive pulmonary disease) (HCC)    Coronary artery disease    Depression    Dysrhythmia    Emphysema of lung (HCC)    History of common carotid artery stent placement    Hypertension    IDA (iron deficiency anemia) 02/09/2020   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   Marijuana use, continuous 02/10/2020   Osteoporosis    PAF (paroxysmal atrial fibrillation) ([place])    Pulmonary emboli ([place]) 06/07/2017   Right lower lobe pulmonary embolism small segmental, multifocal multifocal pneumonia, mediastinal lymphadenopathy, moderate centrilobular emphysema   Seizures ([place])    Stroke ([place])    left sided weakness to arm and leg    Patient Active Problem List   Diagnosis Date Noted   Iron deficiency anemia 02/04/2021   Long term (current) use of  anticoagulants 11/12/2020   Positive colorectal cancer screening using Cologuard test 05/22/2020   Goals of care, counseling/discussion 05/06/2020   Malignant neoplasm of upper lobe of left lung ([place]) 05/06/2020   Preventative health care 03/26/2020   Atherosclerosis of native arteries of extremity with intermittent claudication ([place]) 03/14/2020   Trigger middle finger of right hand 02/20/2020   Abdominal aortic aneurysm (AAA) without rupture ([place]) 02/20/2020   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   Benign essential HTN 02/10/2020   Pre-diabetes 02/10/2020   BPH (benign prostatic hyperplasia) 02/09/2020   Anemia 02/09/2020   Weakness of lower extremity 02/01/2020   COPD (chronic obstructive pulmonary disease) ([place]) 41/74/0814   Chronic systolic CHF (congestive heart failure) ([place]) 02/01/2020   Depression 02/01/2020   A-fib ([place]) 02/01/2020   CVA (cerebral vascular accident) ([place]) 02/01/2020   CAD (coronary artery disease), native coronary artery 02/01/2020   Carotid artery stenosis 02/01/2020   HLD (hyperlipidemia) 02/01/2020   Carotid stenosis, asymptomatic, left 02/14/2018   S/P coronary artery stent placement 09/20/2017   Centrilobular emphysema ([place]) 06/10/2017   Chronic back pain 06/10/2017   DOE (dyspnea on exertion) 06/10/2017   Elevated TSH 06/10/2017   Hematoma of groin 06/10/2017   PAD (peripheral artery disease) ([place]) 06/10/2017   Pulmonary embolism ([place]) 06/10/2017   Tobacco abuse 06/10/2017   Unintended weight loss 06/10/2017   AAA (abdominal aortic aneurysm) ([place]) 06/10/2017   PAF (paroxysmal atrial fibrillation) ([place]) 48/18/5631   Systolic CHF,  chronic ([place]) 06/10/2017   Hypertension 05/31/2017   Other hyperlipidemia 05/31/2017   Hemiparesis affecting left side as late effect of cerebrovascular accident ([place]) 05/29/2017   Carotid artery stenosis, symptomatic, right 09/62/8366   Embolic stroke involving right middle cerebral artery ([place]) 05/28/2017     Past Surgical History:  Procedure Laterality Date   brain aneurysm with clip     COLONOSCOPY WITH PROPOFOL N/A 07/23/2020   Procedure: COLONOSCOPY WITH PROPOFOL;  Surgeon: Lin Landsman, MD;  Location: [place];  Service: Gastroenterology;  Laterality: N/A;   CORONARY ANGIOPLASTY WITH STENT PLACEMENT  07/08/2017   hip repleacement Right    Right total hip arthroplasty   VIDEO BRONCHOSCOPY WITH ENDOBRONCHIAL NAVIGATION N/A 04/23/2020   Procedure: VIDEO BRONCHOSCOPY WITH ENDOBRONCHIAL NAVIGATION;  Surgeon: Tyler Pita, MD;  Location: [facility] ORS;  Service: Pulmonary;  Laterality: N/A;    Prior to Admission medications   Medication Sig Start Date End Date Taking? Authorizing Provider  albuterol (PROVENTIL) (2.5 MG/3ML) 0.083% nebulizer solution Take 3 mLs (2.5 mg total) by nebulization every 6 (six) hours as needed for wheezing or shortness of breath. 10/13/20   Tyler Pita, MD  apixaban (ELIQUIS) 5 MG TABS tablet Take 1 tablet (5 mg total) by mouth in the morning and at bedtime. 11/19/20   Chrismon, Vickki Muff, PA-C  aspirin EC 81 MG tablet Take 81 mg by mouth daily. Swallow whole.    [provider]  atorvastatin (LIPITOR) 40 MG tablet TAKE 1 TABLET(40 MG) BY MOUTH AT BEDTIME 11/17/20   Chrismon, Vickki Muff, PA-C  Budeson-Glycopyrrol-Formoterol (BREZTRI AEROSPHERE) 160-9-4.8 MCG/ACT AERO Inhale 2 puffs into the lungs in the morning and at bedtime. 12/30/20   Tyler Pita, MD  carvedilol (COREG) 25 MG tablet Take 12.5 mg by mouth in the morning and at bedtime.    [provider]  furosemide (LASIX) 40 MG tablet Take 1 tablet (40 mg total) by mouth as needed. 01/30/21 04/30/21  Kate Sable, MD  lamoTRIgine (LAMICTAL) 100 MG tablet Take by mouth. 08/22/20   [provider]  Lidocaine HCl-Benzyl Alcohol (SALONPAS LIDOCAINE PLUS EX) Place 1 patch onto the skin daily as needed (pain.).    [provider]  Multiple Vitamin  (MULTI-VITAMIN) tablet Take 1 tablet by mouth daily.    [provider]  sacubitril-valsartan (ENTRESTO) 49-51 MG Take 0.5 tablets by mouth in the morning and at bedtime.    [provider]  spironolactone (ALDACTONE) 25 MG tablet Take 12.5 mg by mouth at bedtime.    [provider]  tamsulosin (FLOMAX) 0.4 MG CAPS capsule Take 1 capsule (0.4 mg total) by mouth daily. 11/19/20   Chrismon, Vickki Muff, PA-C    Allergies Patient has no known allergies.  Family History  Problem Relation Age of Onset   Diabetes Mother    Heart disease Mother    Stroke Father    Heart disease Father    Heart attack Father    Alcohol abuse Father    Heart disease Sister    Heart attack Sister    Heart disease Maternal Grandmother    Heart attack Maternal Grandmother    Heart attack Paternal Grandmother    Diabetes Brother    Heart disease Brother    Heart attack Brother     Social History Social History   Tobacco Use   Smoking status: Former    Packs/day: 2.00    Years: 53.00    Pack years: 106.00    Types: Cigarettes  Quit date: 05/28/2017    Years since quitting: 3.7   Smokeless tobacco: Never   Tobacco comments:    Quit in 2018  Vaping Use   Vaping Use: Former   Quit date: 01/15/2020   Devices: cbd   Substance Use Topics   Alcohol use: Yes    Comment: socially drink cocktail   Drug use: Yes    Types: Marijuana    Comment: last smoke x1 month ago    Review of Systems  Constitutional: No fever/chills Eyes: No visual changes. ENT: No sore throat. Respiratory: Denies cough Cardiovascular: Denies chest pain Gastrointestinal: Denies abdominal pain Genitourinary: Negative for dysuria. Musculoskeletal: Negative for back pain.  Positive for right wrist/hand pain Skin: Negative for rash. Psychiatric: no mood changes,     ____________________________________________   PHYSICAL EXAM:  VITAL SIGNS: ED Triage Vitals  Enc Vitals Group     BP 02/23/21  0940 (!) 166/75     Pulse Rate 02/23/21 0940 80     Resp 02/23/21 0940 16     Temp 02/23/21 0940 97.8 F (36.6 C)     Temp Source 02/23/21 0940 Oral     SpO2 02/23/21 0940 100 %     Weight 02/23/21 0935 197 lb 15.6 oz (89.8 kg)     Height 02/23/21 0935 5\' 10"  (1.778 m)     Head Circumference --      Peak Flow --      Pain Score 02/23/21 0935 6     Pain Loc --      Pain Edu? --      Excl. in [place]? --     Constitutional: Alert and oriented. Well appearing and in no acute distress. Eyes: Conjunctivae are normal.  Head: Atraumatic. Nose: No congestion/rhinnorhea. Mouth/Throat: Mucous membranes are moist.   Neck:  supple no lymphadenopathy noted Cardiovascular: Normal rate, regular rhythm.  Respiratory: Normal respiratory effort.  No retractions,  GU: deferred Musculoskeletal: FROM all extremities, warm and well perfused, swelling noted in the right hand, area tender to palpation, forearm and wrist are nontender.  Neurovascular is intact Neurologic:  Normal speech and language.  Skin:  Skin is warm, dry and intact. No rash noted. Psychiatric: Mood and affect are normal. Speech and behavior are normal.  ____________________________________________   LABS (all labs ordered are listed, but only abnormal results are displayed)  Labs Reviewed - No data to display ____________________________________________   ____________________________________________  RADIOLOGY  X-ray of the right hand  ____________________________________________   PROCEDURES  Procedure(s) performed: No  Procedures    ____________________________________________   INITIAL IMPRESSION / ASSESSMENT AND PLAN / ED COURSE  Pertinent labs & imaging results that were available during my care of the patient were reviewed by me and considered in my medical decision making (see chart for details).   Patient is a 70 year old male presents with right hand injury.  See HPI.  Physical exam is consistent with  contusion.  X-ray of the right hand  X-ray of the right hand reviewed by me confirmed by radiology to be negative for any acute abnormality.   Explained the findings to the patient and his wife.  Patient was placed in a wrist brace.  He is to follow-up with orthopedics if not improving in 1 week.  See his regular doctor as needed.  Return emergency department for worsening.  Tylenol for pain.  Discharged in stable condition.  Richard Wiggins was evaluated in Emergency Department on 02/23/2021 for the symptoms described in the history of  present illness. He was evaluated in the context of the global COVID-19 pandemic, which necessitated consideration that the patient might be at risk for infection with the SARS-CoV-2 virus that causes COVID-19. Institutional protocols and algorithms that pertain to the evaluation of patients at risk for COVID-19 are in a state of rapid change based on information released by regulatory bodies including the CDC and federal and state organizations. These policies and algorithms were followed during the patient's care in the ED.    As part of my medical decision making, I reviewed the following data within the [place] History obtained from family, Nursing notes reviewed and incorporated, Old chart reviewed, Radiograph reviewed , Notes from prior ED visits, and [place] Controlled Substance Database  ____________________________________________   FINAL CLINICAL IMPRESSION(S) / ED DIAGNOSES  Final diagnoses:  Wrist sprain, right, initial encounter      NEW MEDICATIONS STARTED DURING THIS VISIT:  New Prescriptions   No medications on file     Note:  This document was prepared using Dragon voice recognition software and may include unintentional dictation errors.    Versie Starks, PA-C 02/23/21 1149    Harvest Dark, MD 02/23/21 1505

## 2021-02-24 ENCOUNTER — Ambulatory Visit (INDEPENDENT_AMBULATORY_CARE_PROVIDER_SITE_OTHER): Payer: Medicare Other

## 2021-02-24 DIAGNOSIS — R0602 Shortness of breath: Secondary | ICD-10-CM

## 2021-02-24 LAB — ECHOCARDIOGRAM COMPLETE
AR max vel: 4 cm2
AV Area VTI: 3.68 cm2
AV Area mean vel: 3.79 cm2
AV Mean grad: 1 mmHg
AV Peak grad: 2.6 mmHg
Ao pk vel: 0.81 m/s
Area-P 1/2: 5.42 cm2
Calc EF: 52 %
S' Lateral: 3.03 cm
Single Plane A2C EF: 51.5 %
Single Plane A4C EF: 53.6 %

## 2021-02-27 ENCOUNTER — Telehealth: Payer: Self-pay

## 2021-02-27 NOTE — Telephone Encounter (Signed)
Called and reminded patient of upcoming Covid test, nothing further needed.

## 2021-03-04 ENCOUNTER — Other Ambulatory Visit
Admission: RE | Admit: 2021-03-04 | Discharge: 2021-03-04 | Disposition: A | Discharge status: Home / Self Care | Payer: Medicare Other | Source: Ambulatory Visit | Attending: Pulmonary Disease | Admitting: Pulmonary Disease

## 2021-03-04 ENCOUNTER — Other Ambulatory Visit: Payer: Self-pay

## 2021-03-04 DIAGNOSIS — Z20822 Contact with and (suspected) exposure to covid-19: Secondary | ICD-10-CM | POA: Diagnosis not present

## 2021-03-04 DIAGNOSIS — Z01812 Encounter for preprocedural laboratory examination: Secondary | ICD-10-CM | POA: Diagnosis not present

## 2021-03-05 ENCOUNTER — Ambulatory Visit: Payer: Medicare Other | Attending: Pulmonary Disease

## 2021-03-05 DIAGNOSIS — J449 Chronic obstructive pulmonary disease, unspecified: Secondary | ICD-10-CM | POA: Insufficient documentation

## 2021-03-05 LAB — SARS CORONAVIRUS 2 (TAT 6-24 HRS): SARS Coronavirus 2: NEGATIVE

## 2021-03-05 MED ORDER — ALBUTEROL SULFATE (2.5 MG/3ML) 0.083% IN NEBU
2.5000 mg | INHALATION_SOLUTION | Freq: Once | RESPIRATORY_TRACT | Status: AC
  Administered 2021-03-05: 2.5 mg via RESPIRATORY_TRACT
  Filled 2021-03-05: qty 3

## 2021-03-11 ENCOUNTER — Ambulatory Visit: Payer: Medicare Other | Admitting: Nurse Practitioner

## 2021-03-11 ENCOUNTER — Encounter: Payer: Self-pay | Admitting: Nurse Practitioner

## 2021-03-11 ENCOUNTER — Other Ambulatory Visit: Payer: Self-pay

## 2021-03-11 VITALS — BP 108/60 | HR 59 | Ht 70.0 in | Wt 196.0 lb

## 2021-03-11 DIAGNOSIS — I5020 Unspecified systolic (congestive) heart failure: Secondary | ICD-10-CM

## 2021-03-11 DIAGNOSIS — I714 Abdominal aortic aneurysm, without rupture, unspecified: Secondary | ICD-10-CM

## 2021-03-11 DIAGNOSIS — I255 Ischemic cardiomyopathy: Secondary | ICD-10-CM | POA: Diagnosis not present

## 2021-03-11 DIAGNOSIS — E785 Hyperlipidemia, unspecified: Secondary | ICD-10-CM | POA: Diagnosis not present

## 2021-03-11 DIAGNOSIS — I2510 Atherosclerotic heart disease of native coronary artery without angina pectoris: Secondary | ICD-10-CM

## 2021-03-11 DIAGNOSIS — I480 Paroxysmal atrial fibrillation: Secondary | ICD-10-CM | POA: Diagnosis not present

## 2021-03-11 DIAGNOSIS — N183 Chronic kidney disease, stage 3 unspecified: Secondary | ICD-10-CM

## 2021-03-11 DIAGNOSIS — I6521 Occlusion and stenosis of right carotid artery: Secondary | ICD-10-CM

## 2021-03-11 DIAGNOSIS — I77819 Aortic ectasia, unspecified site: Secondary | ICD-10-CM | POA: Insufficient documentation

## 2021-03-11 NOTE — Patient Instructions (Addendum)
Medication Instructions:  No changes at this time.  *If you need a refill on your cardiac medications before your next appointment, please call your pharmacy*   Lab Work: None  If you have labs (blood work) drawn today and your tests are completely normal, you will receive your results only by: [place] (if you have MyChart) OR A paper copy in the mail If you have any lab test that is abnormal or we need to change your treatment, we will call you to review the results.   Testing/Procedures: None    Follow-Up: At [facility], you and your health needs are our priority.  As part of our continuing mission to provide you with exceptional heart care, we have created designated Provider Care Teams.  These Care Teams include your primary Cardiologist (physician) and Advanced Practice Providers (APPs -  Physician Assistants and Nurse Practitioners) who all work together to provide you with the care you need, when you need it.   Your next appointment:   6 month(s)  The format for your next appointment:   In Person  Provider:   Kate Sable, MD or Murray Hodgkins, NP

## 2021-03-11 NOTE — Progress Notes (Addendum)
Office Visit    Patient Name: Richard Wiggins Date of Encounter: 03/11/2021  Primary Care Provider:  Margo Common, PA-C Primary Cardiologist:  Kate Sable, MD  Chief Complaint    70 year old male with a history of paroxysmal atrial fibrillation, stroke, carotid stenosis status post right carotid endarterectomy, ischemic cardiomyopathy, heart failure with improved ejection fraction, coronary artery disease status post right coronary artery stenting (2018), remote tobacco abuse, COPD, abdominal aortic aneurysm, claudication with normal ABIs, pulmonary embolism, hypertension, hyperlipidemia, lung cancer, anemia, and stage III chronic kidney disease, who presents for follow-up of CAD and heart failure.  Past Medical History    Past Medical History:  Diagnosis Date   AAA (abdominal aortic aneurysm) ([place]) 2020   a. 2020 U/S: 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 -plan medical managmment with tight bp control; b. 09/2020 Abd U/S: 4.1cm.   Anemia 02/09/2020   Anxiety    Benign essential HTN 02/10/2020   BPH (benign prostatic hyperplasia) 02/09/2020   Brain aneurysm    Cancer [facility])    Carotid arterial disease ([place])    a. 05/2017 s/p R carotid endarterectomy following CVA.   Chronic HFimpEF (heart failure with improved ejection fraction) ([place])    a. 2018 Echo: EF 30-35%; b. 02/2021 Echo: EF 55-60%, nl RV fxn. Triv MR. Asc Ao 110mm.   CKD (chronic kidney disease), stage III (HCC)    Claudication ([place])    a. 09/2020 ABIs: R 0.91. L 0.99.   COPD (chronic obstructive pulmonary disease) (HCC)    Coronary artery disease    a. 06/2017 PCI ([place]): s/p PCI to the RCA. LAD 50, LCX 20.   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 02/2021 Asc Ao 28mm.   Emphysema of lung (HCC)    IDA (iron deficiency anemia) 02/09/2020   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   Lung cancer ([place])    Marijuana use, continuous 02/10/2020   Osteoporosis    PAF (paroxysmal atrial  fibrillation) ([place])    a. 05/2017 Dx in setting of CVA. CHA2DS2VASc = 6-->Eliquis.   Pulmonary emboli ([place]) 06/07/2017   Right lower lobe pulmonary embolism small segmental, multifocal multifocal pneumonia, mediastinal lymphadenopathy, moderate centrilobular emphysema   Seizures ([place])    Stroke ([place])    a. 05/2017 - hospitalized in [place]->prolonged hospitalization in setting of R CEA, PE, and finding of RCA dzs on cath; b. Residual left sided weakness to arm and leg.   Past Surgical History:  Procedure Laterality Date   brain aneurysm with clip     COLONOSCOPY WITH PROPOFOL N/A 07/23/2020   Procedure: COLONOSCOPY WITH PROPOFOL;  Surgeon: Lin Landsman, MD;  Location: [facility] END[facility]COPY;  Service: Gastroenterology;  Laterality: N/A;   CORONARY ANGIOPLASTY WITH STENT PLACEMENT  07/08/2017   hip repleacement Right    Right total hip arthroplasty   VIDEO BRONCHOSCOPY WITH ENDOBRONCHIAL NAVIGATION N/A 04/23/2020   Procedure: VIDEO BRONCHOSCOPY WITH ENDOBRONCHIAL NAVIGATION;  Surgeon: Tyler Pita, MD;  Location: [facility] ORS;  Service: Pulmonary;  Laterality: N/A;    Allergies  No Known Allergies  History of Present Illness    70 year old male with a history of paroxysmal atrial fibrillation, stroke, carotid stenosis status post right carotid endarterectomy, ischemic cardiomyopathy, heart failure with improved ejection fraction, coronary artery disease status post right coronary artery stenting (2018), remote tobacco abuse, COPD, abdominal aortic aneurysm, claudication with normal ABIs, pulmonary embolism, hypertension, hyperlipidemia, lung cancer, anemia, and stage III chronic kidney disease.  In November 2018, patient was admitted to a hospital in [place] after suffering a stroke at home.  He was found to be in atrial fibrillation.  He was treated with thrombolytics but subsequently found to have a right carotid stenosis.  This was eventually treated with carotid endarterectomy.  Hospital  course was complicated by pulmonary embolism.  He was also found to have LV dysfunction with an EF of 30 to 35% and underwent cardiac work-up including diagnostic catheterization which revealed severe RCA disease which was treated with PCI.  He had residual, nonobstructive LAD and circumflex disease (50% and 20% respectively).  With medical therapy, improvement in LV function to 60-65% by echocardiogram in July 2021.  He has been chronically anticoagulated with Eliquis.  Patient was last seen in cardiology clinic in December 2021, at which time he noted some weight gain that began to improve after starting as needed Lasix.  Since then, he has used Lasix sparingly and he and his wife say they are not even sure if he is used any of this year.  He has been trying to lose weight because he felt like he was getting "fat," and has been watching what he eats.  He is not particularly active, which she attributes to easy fatigability and leg weakness.  He notes that when he walks about 10 to 20 yards, his thighs feel tired.  He has been worked up by vascular surgery for possible claudication and had normal ABIs in March.  His wife notes that ever since his stroke, he has just been less active and feels that he is probably deconditioned.  He does not experience chest pain or dyspnea.  Further, he denies palpitations, PND, orthopnea, dizziness, syncope, edema, or early satiety.  He recently had an echocardiogram performed through the pulmonology office with finding of an EF of 55 to 60%, normal RV function, trivial MR, and mild dilation of the ascending aorta at 38 mm.  Home Medications    Current Outpatient Medications  Medication Sig Dispense Refill   albuterol (PROVENTIL) (2.5 MG/3ML) 0.083% nebulizer solution Take 3 mLs (2.5 mg total) by nebulization every 6 (six) hours as needed for wheezing or shortness of breath. 75 mL 6   apixaban (ELIQUIS) 5 MG TABS tablet Take 1 tablet (5 mg total) by mouth in the morning  and at bedtime. 90 tablet 2   aspirin EC 81 MG tablet Take 81 mg by mouth daily. Swallow whole.     atorvastatin (LIPITOR) 40 MG tablet TAKE 1 TABLET(40 MG) BY MOUTH AT BEDTIME 90 tablet 3   Budeson-Glycopyrrol-Formoterol (BREZTRI AEROSPHERE) 160-9-4.8 MCG/ACT AERO Inhale 2 puffs into the lungs in the morning and at bedtime. 10.7 g 11   carvedilol (COREG) 25 MG tablet Take 12.5 mg by mouth in the morning and at bedtime.     furosemide (LASIX) 40 MG tablet Take 1 tablet (40 mg total) by mouth as needed. 30 tablet 0   lamoTRIgine (LAMICTAL) 100 MG tablet Take 100 mg by mouth 2 (two) times daily.     Lidocaine HCl-Benzyl Alcohol (SALONPAS LIDOCAINE PLUS EX) Place 1 patch onto the skin daily as needed (pain.).     Multiple Vitamin (MULTI-VITAMIN) tablet Take 1 tablet by mouth daily.     sacubitril-valsartan (ENTRESTO) 49-51 MG Take 0.5 tablets by mouth in the morning and at bedtime.     spironolactone (ALDACTONE) 25 MG tablet Take 12.5 mg by mouth at bedtime.     tamsulosin (FLOMAX) 0.4 MG CAPS capsule Take  1 capsule (0.4 mg total) by mouth daily. 90 capsule 2   No current facility-administered medications for this visit.     Review of Systems    Activity level is low though for the most part, he is asymptomatic.  He denies chest pain, palpitations, PND, orthopnea, dyspnea, dizziness, syncope, edema, or early satiety.  All other systems reviewed and are otherwise negative except as noted above.  Physical Exam    VS:  BP 108/60 (BP Location: Left Arm, Patient Position: Sitting, Cuff Size: Normal)   Pulse (!) 59   Ht 5\' 10"  (1.778 m)   Wt 196 lb (88.9 kg)   SpO2 94%   BMI 28.12 kg/m  , BMI Body mass index is 28.12 kg/m.     GEN: Well nourished, well developed, in no acute distress. HEENT: normal. Neck: Supple, no JVD, carotid bruits, or masses. Cardiac: RRR, no murmurs, rubs, or gallops. No clubbing, cyanosis, edema.  Radials 2+/PT 1+ and equal bilaterally.  Respiratory:  Respirations  regular and unlabored, clear to auscultation bilaterally. GI: Soft, nontender, nondistended, BS + x 4. MS: no deformity or atrophy. Skin: warm and dry, no rash. Neuro:  Strength and sensation are intact. Psych: Flat affect.  Accessory Clinical Findings    ECG personally reviewed by me today -sinus bradycardia, 59- no acute changes.  Lab Results  Component Value Date   WBC 4.3 02/03/2021   HGB 7.5 (L) 02/03/2021   HCT 24.8 (L) 02/03/2021   MCV 100.0 02/03/2021   PLT 155 02/03/2021   Lab Results  Component Value Date   CREATININE 1.45 (H) 02/03/2021   BUN 26 (H) 02/03/2021   NA 137 02/03/2021   K 5.5 (H) 02/03/2021   CL 106 02/03/2021   CO2 24 02/03/2021   Lab Results  Component Value Date   ALT 13 02/03/2021   AST 19 02/03/2021   ALKPHOS 102 02/03/2021   BILITOT 0.5 02/03/2021   Lab Results  Component Value Date   CHOL 116 05/27/2020   HDL 46.10 05/27/2020   LDLCALC 56 05/27/2020   TRIG 70.0 05/27/2020   CHOLHDL 3 05/27/2020    Lab Results  Component Value Date   HGBA1C 5.7 (H) 02/01/2020    Assessment & Plan    1.  Coronary artery disease: Status post PCI of the right coronary artery in December 2018, in [place].  He was noted to have residual moderate circumflex and LAD disease at that time.  Patient has done well without any chest pain or significant dyspnea.  He is not particularly active however.  He remains on low-dose aspirin, statin, and beta-blocker.  2.  Chronic heart failure with improved ejection fraction/ischemic cardiomyopathy: EF previously as low as 30 to 35% November 2018 with subsequent improvement to 60 to 65% in July 2021.  Recent echo showed ongoing normal LV function (55-60%) with normal RV function.  Patient does not experience significant dyspnea or orthopnea.  His weight is down from his December visit and he has not required any as needed Lasix in several months.  He remains on beta-blocker, Entresto, and spironolactone therapy.  I noted  that his potassium was mildly elevated at 5.5 on July 26.  He has been using salt substitute at home and I encouraged him to stop using this.  He notes that he is due for follow-up labs with the cancer center and therefore I will not order today.  3.  Essential hypertension: Stable on beta-blocker, Entresto, and spironolactone.  4.  Hyperlipidemia:  LDL of 56 last November.  LFTs normal in July.  Continue statin therapy.  5.  Paroxysmal atrial fibrillation: He denies palpitations and is in sinus rhythm today.  Continue beta-blocker and Eliquis therapy.  6.  Stage III chronic kidney disease: Creatinine relatively stable at 1.45 on July 26.  He remains on ARB therapy (valsartan via Entresto).  7.  Peripheral arterial disease/carotid arterial disease: Status post right carotid endarterectomy in November 2018.  Normal ABIs earlier this year.  Followed by vascular surgery.  8.  Abdominal aortic aneurysm: 4.1 cm by abdominal ultrasound in March.  Followed by vascular surgery.  Blood pressure well controlled.  9.  History of stroke: In the setting of paroxysmal atrial fibrillation.  Mild residual left-sided weakness.  Chronically anticoagulated with Eliquis.  10.  Normocytic anemia: Recently found to be anemic with H&H of 7.5 and 23.2 respectively.  He is followed by hematology with recent iron studies and plan for iron infusion.  Patient denies any history of bleeding/melena.  11.  Disposition: Follow-up in 6 months or sooner if necessary.  Murray Hodgkins, NP 03/11/2021, 5:23 PM

## 2021-03-12 ENCOUNTER — Other Ambulatory Visit: Payer: Self-pay | Admitting: *Deleted

## 2021-03-12 MED ORDER — FUROSEMIDE 40 MG PO TABS: 40.0000 mg | ORAL_TABLET | ORAL | 0 refills | Status: DC | PRN

## 2021-03-13 ENCOUNTER — Other Ambulatory Visit: Payer: Self-pay | Admitting: *Deleted

## 2021-03-13 ENCOUNTER — Ambulatory Visit (INDEPENDENT_AMBULATORY_CARE_PROVIDER_SITE_OTHER): Payer: Medicare Other

## 2021-03-13 ENCOUNTER — Ambulatory Visit (INDEPENDENT_AMBULATORY_CARE_PROVIDER_SITE_OTHER): Payer: Medicare Other | Admitting: Vascular Surgery

## 2021-03-13 ENCOUNTER — Other Ambulatory Visit: Payer: Self-pay

## 2021-03-13 DIAGNOSIS — I714 Abdominal aortic aneurysm, without rupture, unspecified: Secondary | ICD-10-CM

## 2021-03-13 MED ORDER — FUROSEMIDE 40 MG PO TABS
40.0000 mg | ORAL_TABLET | ORAL | 2 refills | Status: DC | PRN
Start: 2021-03-13 — End: 2021-11-19

## 2021-03-25 ENCOUNTER — Encounter (INDEPENDENT_AMBULATORY_CARE_PROVIDER_SITE_OTHER): Payer: Self-pay | Admitting: *Deleted

## 2021-03-27 ENCOUNTER — Encounter (INDEPENDENT_AMBULATORY_CARE_PROVIDER_SITE_OTHER): Payer: Self-pay | Admitting: *Deleted

## 2021-03-30 ENCOUNTER — Other Ambulatory Visit: Payer: Self-pay | Admitting: *Deleted

## 2021-03-30 ENCOUNTER — Other Ambulatory Visit: Payer: Self-pay

## 2021-03-30 ENCOUNTER — Inpatient Hospital Stay: Payer: Medicare Other | Attending: Oncology

## 2021-03-30 DIAGNOSIS — D508 Other iron deficiency anemias: Secondary | ICD-10-CM

## 2021-03-30 DIAGNOSIS — C3412 Malignant neoplasm of upper lobe, left bronchus or lung: Secondary | ICD-10-CM

## 2021-03-30 DIAGNOSIS — D509 Iron deficiency anemia, unspecified: Secondary | ICD-10-CM | POA: Insufficient documentation

## 2021-03-30 DIAGNOSIS — D649 Anemia, unspecified: Secondary | ICD-10-CM

## 2021-03-30 LAB — COMPREHENSIVE METABOLIC PANEL
ALT: 14 U/L (ref 0–44)
AST: 22 U/L (ref 15–41)
Albumin: 4.1 g/dL (ref 3.5–5.0)
Alkaline Phosphatase: 124 U/L (ref 38–126)
Anion gap: 7 (ref 5–15)
BUN: 37 mg/dL — ABNORMAL HIGH (ref 8–23)
CO2: 27 mmol/L (ref 22–32)
Calcium: 9.3 mg/dL (ref 8.9–10.3)
Chloride: 104 mmol/L (ref 98–111)
Creatinine, Ser: 1.83 mg/dL — ABNORMAL HIGH (ref 0.61–1.24)
GFR, Estimated: 39 mL/min — ABNORMAL LOW (ref >60)
Glucose, Bld: 99 mg/dL (ref 70–99)
Potassium: 5.8 mmol/L — ABNORMAL HIGH (ref 3.5–5.1)
Sodium: 138 mmol/L (ref 135–145)
Total Bilirubin: 0.5 mg/dL (ref 0.3–1.2)
Total Protein: 8 g/dL (ref 6.5–8.1)

## 2021-03-30 LAB — FERRITIN: Ferritin: 470 ng/mL — ABNORMAL HIGH (ref 24–336)

## 2021-03-30 LAB — CBC
HCT: 29.3 % — ABNORMAL LOW (ref 39.0–52.0)
Hemoglobin: 9 g/dL — ABNORMAL LOW (ref 13.0–17.0)
MCH: 30.6 pg (ref 26.0–34.0)
MCHC: 30.7 g/dL (ref 30.0–36.0)
MCV: 99.7 fL (ref 80.0–100.0)
Platelets: 153 10*3/uL (ref 150–400)
RBC: 2.94 MIL/uL — ABNORMAL LOW (ref 4.22–5.81)
RDW: 15 % (ref 11.5–15.5)
WBC: 4.3 10*3/uL (ref 4.0–10.5)
nRBC: 0 % (ref 0.0–0.2)

## 2021-03-30 LAB — IRON AND TIBC
Iron: 57 ug/dL (ref 45–182)
Saturation Ratios: 18 % (ref 17.9–39.5)
TIBC: 319 ug/dL (ref 250–450)
UIBC: 262 ug/dL

## 2021-04-05 ENCOUNTER — Other Ambulatory Visit: Payer: Self-pay | Admitting: Family Medicine

## 2021-04-05 DIAGNOSIS — I480 Paroxysmal atrial fibrillation: Secondary | ICD-10-CM

## 2021-04-05 DIAGNOSIS — I639 Cerebral infarction, unspecified: Secondary | ICD-10-CM

## 2021-04-05 DIAGNOSIS — I6529 Occlusion and stenosis of unspecified carotid artery: Secondary | ICD-10-CM

## 2021-04-06 NOTE — Telephone Encounter (Signed)
Last labs 03/30/21. No future visit scheduled at this time

## 2021-04-08 ENCOUNTER — Other Ambulatory Visit: Payer: Self-pay

## 2021-04-08 ENCOUNTER — Encounter: Payer: Self-pay | Admitting: Radiation Oncology

## 2021-04-08 ENCOUNTER — Ambulatory Visit
Admission: RE | Admit: 2021-04-08 | Discharge: 2021-04-08 | Disposition: A | Discharge status: Home / Self Care | Payer: Medicare Other | Source: Ambulatory Visit | Attending: Radiation Oncology | Admitting: Radiation Oncology

## 2021-04-08 VITALS — BP 131/83 | HR 72 | Temp 96.1°F | Resp 16 | Wt 193.7 lb

## 2021-04-08 DIAGNOSIS — Z08 Encounter for follow-up examination after completed treatment for malignant neoplasm: Secondary | ICD-10-CM | POA: Diagnosis not present

## 2021-04-08 DIAGNOSIS — C3412 Malignant neoplasm of upper lobe, left bronchus or lung: Secondary | ICD-10-CM | POA: Diagnosis not present

## 2021-04-08 DIAGNOSIS — R059 Cough, unspecified: Secondary | ICD-10-CM | POA: Insufficient documentation

## 2021-04-08 DIAGNOSIS — Z87891 Personal history of nicotine dependence: Secondary | ICD-10-CM | POA: Diagnosis not present

## 2021-04-08 DIAGNOSIS — Z923 Personal history of irradiation: Secondary | ICD-10-CM | POA: Insufficient documentation

## 2021-04-08 DIAGNOSIS — D649 Anemia, unspecified: Secondary | ICD-10-CM | POA: Diagnosis not present

## 2021-04-08 DIAGNOSIS — C3490 Malignant neoplasm of unspecified part of unspecified bronchus or lung: Secondary | ICD-10-CM

## 2021-04-08 NOTE — Progress Notes (Signed)
Radiation Oncology Follow up Note  Name: Richard Wiggins   Date:   04/08/2021 MRN:  [id number] DOB: 01/17/1951    This 70 y.o. male presents to the clinic today for 49-month follow-up status post SBRT to his left upper lobe for presumed stage I non-small cell lung cancer.  REFERRING PROVIDER: Chrismon, Vickki Muff, PA-C  HPI: Patient is a 70 year old male now out 9 months having completed SBRT to his left upper lobe for presumed stage I non-small cell lung cancer.  He is doing fairly well he is being followed by medical oncology and hematology for anemia..  CT scan back in June shows no acute intrathoracic pathology no significant interval change in the size of the bilateral pulmonary nodules as described above no new nodules does have some mildly enlarged mediastinal lymph nodes.  He is scheduled for another scan I believe in December.  He specifically has cough hemoptysis or chest tightness.  COMPLICATIONS OF TREATMENT: none  FOLLOW UP COMPLIANCE: keeps appointments   PHYSICAL EXAM:  BP 131/83 (BP Location: Left Arm, Patient Position: Sitting)   Pulse 72   Temp (!) 96.1 F (35.6 C) (Tympanic)   Resp 16   Wt 193 lb 11.2 oz (87.9 kg)   BMI 27.79 kg/m  Well-developed well-nourished patient in NAD. HEENT reveals PERLA, EOMI, discs not visualized.  Oral cavity is clear. No oral mucosal lesions are identified. Neck is clear without evidence of cervical or supraclavicular adenopathy. Lungs are clear to A&P. Cardiac examination is essentially unremarkable with regular rate and rhythm without murmur rub or thrill. Abdomen is benign with no organomegaly or masses noted. Motor sensory and DTR levels are equal and symmetric in the upper and lower extremities. Cranial nerves II through XII are grossly intact. Proprioception is intact. No peripheral adenopathy or edema is identified. No motor or sensory levels are noted. Crude visual fields are within normal range.  RADIOLOGY RESULTS: CT scans reviewed  compatible with above-stated findings  PLAN: Present time patient is doing well I will review his CT scans when they are performed in November of asked to see him back in 6 months for follow-up.  He continues close follow-up care with medical oncology.  Patient knows to call with any concerns.  I would like to take this opportunity to thank you for allowing me to participate in the care of your patient.Noreene Filbert, MD

## 2021-05-15 ENCOUNTER — Telehealth: Payer: Self-pay | Admitting: Pulmonary Disease

## 2021-05-15 MED ORDER — ALBUTEROL SULFATE HFA 108 (90 BASE) MCG/ACT IN AERS: 2.0000 | INHALATION_SPRAY | Freq: Four times a day (QID) | RESPIRATORY_TRACT | 0 refills | Status: DC | PRN

## 2021-05-15 NOTE — Telephone Encounter (Signed)
Spoke to patient's spouse, Richard Wiggins(DPR). Richard Wiggins stated that patient is currently out of town and he forgot all of his inhalers.  Richard Wiggins is questioning if patient can take Primatene Mist. If Dr. Patsey Berthold does not recommend primatene mist, can a Rx for albuterol be sent to [facility] in [place]? Patient is concerned about taking primatene with his cardiac issues.  Patient is not currently having any new of worsening breathing problems.   Dr. Mortimer Fries, please advise. Dr. Patsey Berthold is unavailable.

## 2021-05-15 NOTE — Telephone Encounter (Signed)
Ventolin has been sent to preferred pharmacy. Richard Wiggins is aware and voiced her understanding.  Nothing further needed at this time.

## 2021-05-21 ENCOUNTER — Other Ambulatory Visit: Payer: Self-pay

## 2021-05-21 ENCOUNTER — Encounter: Payer: Self-pay | Admitting: Podiatry

## 2021-05-21 ENCOUNTER — Ambulatory Visit: Payer: Medicare Other | Admitting: Podiatry

## 2021-05-21 DIAGNOSIS — M79675 Pain in left toe(s): Secondary | ICD-10-CM | POA: Diagnosis not present

## 2021-05-21 DIAGNOSIS — M79674 Pain in right toe(s): Secondary | ICD-10-CM | POA: Diagnosis not present

## 2021-05-21 DIAGNOSIS — I739 Peripheral vascular disease, unspecified: Secondary | ICD-10-CM

## 2021-05-21 DIAGNOSIS — B351 Tinea unguium: Secondary | ICD-10-CM

## 2021-05-21 DIAGNOSIS — Z7901 Long term (current) use of anticoagulants: Secondary | ICD-10-CM

## 2021-05-21 NOTE — Progress Notes (Signed)
This patient returns to my office for at risk foot care.  This patient requires this care by a professional since this patient will be at risk due to having coagulation defect and PAD.  This patient is unable to cut nails himself since the patient cannot reach his nails.These nails are painful walking and wearing shoes.  This patient presents for at risk foot care today.  Patient presents to the office with his wife.  General Appearance  Alert, conversant and in no acute stress.  Vascular  Dorsalis pedis and posterior tibial  pulses are palpable  bilaterally.  Capillary return is within normal limits  bilaterally. Temperature is within normal limits  bilaterally.  Neurologic  Senn-Weinstein monofilament wire test within normal limits  bilaterally. Muscle power within normal limits bilaterally.  Nails Thick disfigured discolored nails with subungual debris  hallux nails bilaterally. No evidence of bacterial infection or drainage bilaterally. Hallux pincer nails.  Orthopedic  No limitations of motion  feet .  No crepitus or effusions noted.  No bony pathology or digital deformities noted.  Skin  normotropic skin with no porokeratosis noted bilaterally.  No signs of infections or ulcers noted.     Onychomycosis  Pain in right toes  Pain in left toes  Consent was obtained for treatment procedures.   Mechanical debridement of nails 1-5  bilaterally performed with a nail nipper.  Filed with dremel without incident.    Return office visit    3 months                 Told patient to return for periodic foot care and evaluation due to potential at risk complications.   Gardiner Barefoot DPM

## 2021-06-09 ENCOUNTER — Telehealth: Payer: Self-pay | Admitting: Cardiology

## 2021-06-09 ENCOUNTER — Other Ambulatory Visit: Payer: Self-pay

## 2021-06-09 MED ORDER — SPIRONOLACTONE 25 MG PO TABS: 12.5000 mg | ORAL_TABLET | Freq: Every day | ORAL | 0 refills | Status: DC

## 2021-06-09 MED ORDER — CARVEDILOL 25 MG PO TABS: 12.5000 mg | ORAL_TABLET | Freq: Two times a day (BID) | ORAL | 0 refills | Status: DC

## 2021-06-09 NOTE — Telephone Encounter (Signed)
*  STAT* If patient is at the pharmacy, call can be transferred to refill team.   1. Which medications need to be refilled? (please list the of each medication and dose if known) Carvedilol  2. Which pharmacy/location (including street and city if local pharmacy) is medication to be sent to? [place]  3. Do they need a 30 day or 90 day supply? [place]

## 2021-06-09 NOTE — Telephone Encounter (Signed)
Requested Prescriptions   Signed Prescriptions Disp Refills   spironolactone (ALDACTONE) 25 MG tablet 45 tablet 0    Sig: Take 0.5 tablets (12.5 mg total) by mouth at bedtime.    Authorizing Provider: Kate Sable    Ordering User: Raelene Bott, Lateasha Breuer L

## 2021-06-09 NOTE — Telephone Encounter (Signed)
*  STAT* If patient is at the pharmacy, call can be transferred to refill team.   1. Which medications need to be refilled? (please list name of each medication and dose if known) spironolactone 25 MG 12.5 MG at bedtime  2. Which pharmacy/location (including street and city if local pharmacy) is medication to be sent to? [facility] at [address]  3. Do they need a 30 day or 90 day supply? 90 day

## 2021-06-23 ENCOUNTER — Encounter: Payer: Self-pay | Admitting: Cardiology

## 2021-06-26 ENCOUNTER — Telehealth: Payer: Self-pay | Admitting: Cardiology

## 2021-06-26 NOTE — Telephone Encounter (Signed)
° °  Pre-operative Risk Assessment    Patient Name: Richard Wiggins  DOB: 05-Jun-1951 MRN: [id number]     Request for Surgical Clearance    Procedure:   soft tissue surgery about implants  Date of Surgery:  Clearance TBD                                 Surgeon:  Dr Desma Maxim, DDS Surgeon's Group or Practice Name:  [facility] Phone number:  [phone] Fax number:  [phone]   Type of Clearance Requested:   - Medical  - Pharmacy:  Hold please advise      Type of Anesthesia:   4% articaine   Additional requests/questions:    Manfred Arch   06/26/2021, 1:07 PM

## 2021-06-26 NOTE — Telephone Encounter (Signed)
° ° °  Patient Name: Richard Wiggins  DOB: 1951/02/03 MRN: [id number]  Primary Cardiologist: Kate Sable, MD  Chart reviewed as part of pre-operative protocol coverage. Given past medical history and time since last visit, based on ACC/AHA guidelines, [name] would be at acceptable risk for the planned procedure without further cardiovascular testing.   The patient was advised that if he develops new symptoms prior to surgery to contact our office to arrange for a follow-up visit, and he verbalized understanding.  Per Dr. Garen Lah, "Okay to hold Eliquis 48 hours prior to procedure.  Restart as soon as possible after. "  Patient does not need SBE prophylaxis  I will route this recommendation to the requesting party via Epic fax function and remove from pre-op pool.  Please call with questions.  [place], [place] 06/26/2021, 5:32 PM

## 2021-07-08 ENCOUNTER — Other Ambulatory Visit: Payer: Self-pay

## 2021-07-08 ENCOUNTER — Ambulatory Visit
Admission: RE | Admit: 2021-07-08 | Discharge: 2021-07-08 | Disposition: A | Discharge status: Home / Self Care | Payer: Medicare Other | Source: Ambulatory Visit | Attending: Oncology | Admitting: Oncology

## 2021-07-08 DIAGNOSIS — R918 Other nonspecific abnormal finding of lung field: Secondary | ICD-10-CM | POA: Diagnosis not present

## 2021-07-08 DIAGNOSIS — C3490 Malignant neoplasm of unspecified part of unspecified bronchus or lung: Secondary | ICD-10-CM | POA: Diagnosis not present

## 2021-07-08 DIAGNOSIS — I700 Atherosclerosis of aorta: Secondary | ICD-10-CM | POA: Diagnosis not present

## 2021-07-08 DIAGNOSIS — J439 Emphysema, unspecified: Secondary | ICD-10-CM | POA: Diagnosis not present

## 2021-07-08 DIAGNOSIS — C3412 Malignant neoplasm of upper lobe, left bronchus or lung: Secondary | ICD-10-CM | POA: Insufficient documentation

## 2021-07-08 DIAGNOSIS — R911 Solitary pulmonary nodule: Secondary | ICD-10-CM | POA: Diagnosis not present

## 2021-07-10 ENCOUNTER — Other Ambulatory Visit: Payer: Self-pay

## 2021-07-10 ENCOUNTER — Inpatient Hospital Stay: Payer: Medicare Other | Attending: Oncology | Admitting: Oncology

## 2021-07-10 ENCOUNTER — Encounter: Payer: Self-pay | Admitting: Pulmonary Disease

## 2021-07-10 ENCOUNTER — Encounter: Payer: Self-pay | Admitting: Oncology

## 2021-07-10 DIAGNOSIS — C3490 Malignant neoplasm of unspecified part of unspecified bronchus or lung: Secondary | ICD-10-CM | POA: Diagnosis not present

## 2021-07-10 DIAGNOSIS — C3412 Malignant neoplasm of upper lobe, left bronchus or lung: Secondary | ICD-10-CM | POA: Diagnosis not present

## 2021-07-10 NOTE — Progress Notes (Signed)
Appetite is he eats what he wants when he wants them . Sometimes he eats once day and sometimes twice. Urinates good and as good BM most of the time. Sob on excertion and can't walk the dogs very far with cold air and sob

## 2021-07-10 NOTE — Progress Notes (Signed)
Hematology/Oncology Consult note [facility]  Telephone:([phone] [phone]  Patient Care Team: Chrismon, Vickki Muff, PA-C (Inactive) as PCP - General (Family Medicine) Kate Sable, MD as PCP - Cardiology (Cardiology) Telford Nab, RN as Oncology Nurse Navigator Noreene Filbert, MD as Referring Physician (Radiation Oncology) Sindy Guadeloupe, MD as Consulting Physician (Oncology)   Name of the patient: Richard Wiggins  [id number]  10-19-1950   Date of visit: 07/10/21  Diagnosis- stage I lung cancer s/p SBRT  Chief complaint/ Reason for visit-discuss CT scan results and further management  Heme/Onc history: Patient is a 70 year old male who was a former smoker and quit in 2018.  He went to the ER in July 2021 for possible strokelike symptoms and was found to have a left upper lobe lung nodule which was followed up with a CT chest which showed a 1.2 x 1.1 cm left upper lobe spiculated nodule.  This was followed by a PET scan which showed that nodule was hypermetabolic with an SUV of 0.25 patient was also incidentally noted to have a 4.3 cm abdominal aortic aneurysm.  He follows up with Dr. Lucky Cowboy for his aneurysm.  He was seen by Dr. Patsey Berthold and underwent ENB.  Biopsy was nondiagnostic.  However given that the nodule was hypermetabolic it was concerning for malignancy and recommendation per Dr. Patsey Berthold was empiric radiation. Patient underwent SBRT to his left upper lobe by Dr. Baruch Gouty   Patient also has a positive fit test.  Colonoscopy in January 2022 showed no evidence of colon cancer.  There wereCouple of polyps which were resected and were consistent with tubular adenoma  Interval history-patient is here with his wife today.  Overall doing well.  He has baseline fatigue and exertional shortness of breath but no recent hospitalizations.  ECOG PS- 1 Pain scale- 0   Review of systems- Review of Systems  Constitutional:  Positive for  malaise/fatigue. Negative for chills, fever and weight loss.  HENT:  Negative for congestion, ear discharge and nosebleeds.   Eyes:  Negative for blurred vision.  Respiratory:  Negative for cough, hemoptysis, sputum production, shortness of breath and wheezing.   Cardiovascular:  Negative for chest pain, palpitations, orthopnea and claudication.  Gastrointestinal:  Negative for abdominal pain, blood in stool, constipation, diarrhea, heartburn, melena, nausea and vomiting.  Genitourinary:  Negative for dysuria, flank pain, frequency, hematuria and urgency.  Musculoskeletal:  Negative for back pain, joint pain and myalgias.  Skin:  Negative for rash.  Neurological:  Negative for dizziness, tingling, focal weakness, seizures, weakness and headaches.  Endo/Heme/Allergies:  Does not bruise/bleed easily.  Psychiatric/Behavioral:  Negative for depression and suicidal ideas. The patient does not have insomnia.      No Known Allergies   Past Medical History:  Diagnosis Date   AAA (abdominal aortic aneurysm) 2020   a. 2020 U/S: 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 -plan medical managmment with tight bp control; b. 09/2020 Abd U/S: 4.1cm.   Anemia 02/09/2020   Anxiety    Benign essential HTN 02/10/2020   BPH (benign prostatic hyperplasia) 02/09/2020   Brain aneurysm    Cancer [facility])    Carotid arterial disease ([place])    a. 05/2017 s/p R carotid endarterectomy following CVA.   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 2018 Echo: EF 30-35%; b. 02/2021 Echo: EF 55-60%, nl RV fxn. Triv MR. Asc Ao 46mm.   CKD (chronic kidney disease), stage III ([place])  Claudication [facility])    a. 09/2020 ABIs: R 0.91. L 0.99.   COPD (chronic obstructive pulmonary disease) (H[facility])    Coronary artery disease    a. 06/2017 PCI ([place]): s/p PCI to the RCA. LAD 50, LCX 20.   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 02/2021 Asc Ao 26mm.   Emphysema of lung ([facility])    IDA  (iron deficiency anemia) 02/09/2020   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   Lung cancer ([place])    Marijuana use, continuous 02/10/2020   Osteoporosis    PAF (paroxysmal atrial fibrillation) ([place])    a. 05/2017 Dx in setting of CVA. CHA2DS2VASc = 6-->Eliquis.   Pulmonary emboli ([place]) 06/07/2017   Right lower lobe pulmonary embolism small segmental, multifocal multifocal pneumonia, mediastinal lymphadenopathy, moderate centrilobular emphysema   Seizures ([place])    Stroke ([place])    a. 05/2017 - hospitalized in [place]->prolonged hospitalization in setting of R CEA, PE, and finding of RCA dzs on cath; b. Residual left sided weakness to arm and leg.     Past Surgical History:  Procedure Laterality Date   brain aneurysm with clip     COLONOSCOPY WITH PROPOFOL N/A 07/23/2020   Procedure: COLONOSCOPY WITH PROPOFOL;  Surgeon: Lin Landsman, MD;  Location: [facility] ENDOSCOPY;  Service: Gastroenterology;  Laterality: N/A;   CORONARY ANGIOPLASTY WITH STENT PLACEMENT  07/08/2017   hip repleacement Right    Right total hip arthroplasty   VIDEO BRONCHOSCOPY WITH ENDOBRONCHIAL NAVIGATION N/A 04/23/2020   Procedure: VIDEO BRONCHOSCOPY WITH ENDOBRONCHIAL NAVIGATION;  Surgeon: Tyler Pita, MD;  Location: [facility] ORS;  Service: Pulmonary;  Laterality: N/A;    Social History   Socioeconomic History   Marital status: Married    Spouse name: Not on file   Number of children: Not on file   Years of education: Not on file   Highest education level: High school graduate  Occupational History   Occupation: Retired  Tobacco Use   Smoking status: Former    Packs/day: 2.00    Years: 53.00    Pack years: 106.00    Types: Cigarettes    Quit date: 05/28/2017    Years since quitting: 4.1   Smokeless tobacco: Never   Tobacco comments:    Quit in 2018  Vaping Use   Vaping Use: Former   Quit date: 01/15/2020   Devices: cbd   Substance and Sexual Activity   Alcohol use: Yes    Comment: socially  drink cocktail   Drug use: Not Currently    Comment: last smoke x1 month ago   Sexual activity: Not Currently  Other Topics Concern   Not on file  Social History Narrative   Lives with wife. 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 Children x2 children and grandchildren grown.    Social Determinants of Health   Financial Resource Strain: Not on file  Food Insecurity: Not on file  Transportation Needs: Not on file  Physical Activity: Not on file  Stress: Not on file  Social Connections: Not on file  Intimate Partner Violence: Not on file    Family History  Problem Relation Age of Onset   Diabetes Mother    Heart disease Mother    Stroke Father    Heart disease Father    Heart attack Father    Alcohol abuse Father    Heart disease Sister    Heart attack Sister    Heart disease Maternal Grandmother  Heart attack Maternal Grandmother    Heart attack Paternal Grandmother    Diabetes Brother    Heart disease Brother    Heart attack Brother      Current Outpatient Medications:    albuterol (PROVENTIL) (2.5 MG/3ML) 0.083% nebulizer solution, Take 3 mLs (2.5 mg total) by nebulization every 6 (six) hours as needed for wheezing or shortness of breath., Disp: 75 mL, Rfl: 6   aspirin EC 81 MG tablet, Take 81 mg by mouth daily. Swallow whole., Disp: , Rfl:    atorvastatin (LIPITOR) 40 MG tablet, TAKE 1 TABLET(40 MG) BY MOUTH AT BEDTIME, Disp: 90 tablet, Rfl: 3   Budeson-Glycopyrrol-Formoterol (BREZTRI AEROSPHERE) 160-9-4.8 MCG/ACT AERO, Inhale 2 puffs into the lungs in the morning and at bedtime., Disp: 10.7 g, Rfl: 11   carvedilol (COREG) 25 MG tablet, Take 0.5 tablets (12.5 mg total) by mouth in the morning and at bedtime., Disp: 90 tablet, Rfl: 0   ELIQUIS 5 MG TABS tablet, TAKE 1 TABLET(5 MG) BY MOUTH IN THE MORNING AND AT BEDTIME, Disp: 90 tablet, Rfl: 2   furosemide (LASIX) 40 MG tablet, Take 1 tablet (40 mg total) by mouth as needed., Disp: 90 tablet, Rfl: 2    lamoTRIgine (LAMICTAL) 100 MG tablet, Take 100 mg by mouth 2 (two) times daily., Disp: , Rfl:    Lidocaine HCl-Benzyl Alcohol (SALONPAS LIDOCAINE PLUS EX), Place 1 patch onto the skin daily as needed (pain.)., Disp: , Rfl:    Multiple Vitamin (MULTI-VITAMIN) tablet, Take 1 tablet by mouth daily., Disp: , Rfl:    sacubitril-valsartan (ENTRESTO) 49-51 MG, Take 0.5 tablets by mouth in the morning and at bedtime., Disp: , Rfl:    spironolactone (ALDACTONE) 25 MG tablet, Take 0.5 tablets (12.5 mg total) by mouth at bedtime., Disp: 45 tablet, Rfl: 0   tamsulosin (FLOMAX) 0.4 MG CAPS capsule, Take 1 capsule (0.4 mg total) by mouth daily., Disp: 90 capsule, Rfl: 2   albuterol (VENTOLIN HFA) 108 (90 Base) MCG/ACT inhaler, Inhale 2 puffs into the lungs every 6 (six) hours as needed for wheezing or shortness of breath. (Patient not taking: Reported on 07/10/2021), Disp: 8 g, Rfl: 0  Physical exam:  Physical Exam Constitutional:      General: He is not in acute distress. Cardiovascular:     Rate and Rhythm: Normal rate and regular rhythm.     Heart sounds: Normal heart sounds.  Pulmonary:     Effort: Pulmonary effort is normal.     Breath sounds: Normal breath sounds.  Abdominal:     General: Bowel sounds are normal.     Palpations: Abdomen is soft.  Skin:    General: Skin is warm and dry.  Neurological:     Mental Status: He is alert and oriented to person, place, and time.     CMP Latest Ref Rng & Units 03/30/2021  Glucose 70 - 99 mg/dL 99  BUN 8 - 23 mg/dL 37(H)  Creatinine 0.61 - 1.24 mg/dL 1.83(H)  Sodium 135 - 145 mmol/L 138  Potassium 3.5 - 5.1 mmol/L 5.8(H)  Chloride 98 - 111 mmol/L 104  CO2 22 - 32 mmol/L 27  Calcium 8.9 - 10.3 mg/dL 9.3  Total Protein 6.5 - 8.1 g/dL 8.0  Total Bilirubin 0.3 - 1.2 mg/dL 0.5  Alkaline Phos 38 - 126 U/L 124  AST 15 - 41 U/L 22  ALT 0 - 44 U/L 14   CBC Latest Ref Rng & Units 03/30/2021  WBC 4.0 - 10.5 K/uL  4.3  Hemoglobin 13.0 - 17.0 g/dL 9.0(L)   Hematocrit 39.0 - 52.0 % 29.3(L)  Platelets 150 - 400 K/uL 153    No images are attached to the encounter.  CT Chest Wo Contrast  Result Date: 07/08/2021 CLINICAL DATA:  Non-small cell lung cancer EXAM: CT CHEST WITHOUT CONTRAST TECHNIQUE: Multidetector CT imaging of the chest was performed following the standard protocol without IV contrast. COMPARISON:  Multiple priors, most recent January 02, 2021 FINDINGS: Cardiovascular: Normal heart size. No pericardial effusion. Three-vessel coronary artery calcifications. Atherosclerotic disease of the thoracic aorta. Mediastinum/Nodes: Continue interval increased size of left hilar lymph node, measuring 1.1 cm on series 2, image 69, previously measured up to 9 mm. Thyroid is unremarkable. Esophagus is unremarkable. Lungs/Pleura: Central airways are patent. Centrilobular emphysema. Solid left upper lobe pulmonary nodule is similar in size, measures approximately 11 x 6 mm, but demonstrates increased surrounding consolidations and ground-glass opacities with areas of architectural distortion. Additional bilateral solid pulmonary nodules are unchanged in size when compared to prior exam. Reference 5 mm nodule of the left upper lobe located on series 3, image 35. Upper Abdomen: Partially visualized infrarenal abdominal aortic aneurysm, better visualized on prior CT angio dated January 27, 2021. No acute abnormality. Musculoskeletal: No chest wall mass or suspicious bone lesions identified. IMPRESSION: 1. Solid left upper lobe pulmonary nodule is similar in size but demonstrates increased surrounding consolidations and ground-glass opacities, findings are likely due to evolving post radiation change. 2. Continued interval increased size of left hilar lymph node, concerning for metastatic disease. 3. Additional previously seen solid pulmonary nodules are stable when compared with prior exam. 4. Aortic Atherosclerosis (ICD10-I70.0) and Emphysema (ICD10-J43.9). Electronically  Signed   By: Yetta Glassman M.D.   On: 07/08/2021 16:27     Assessment and plan- Patient is a 70 y.o. male with history of stage I presumed lung cancer s/p SBRT.  He is here to discuss CT scan results and further management  I have reviewed CT chest images independently and discussed findings with the patient.  Have also discussed his case withDr. Donella Stade.  Left upper lobe lung nodule which was previously radiated demonstrates postradiation changes but is overall stable.  However there is continued interval increase in the size of the left hilar lymph node which is now 1.1 cm from 9 mm previously on CT scan in June 2022.  I will therefore proceed with a PET CT scan at this time.  If it is hypermetabolic on PET we will consider referring him to pulmonary for EBUS guided biopsy.  However if it is not hypermetabolic on PET scan we could potentially watch it with a repeat CT in 3 months.  I will see him back in 3 months with a CT chest without contrast but potentially sooner if further intervention is required based on PET CT scan results   Visit Diagnosis 1. Malignant neoplasm of upper lobe of left lung ([place])   2. Malignant neoplasm of unspecified part of unspecified bronchus or lung ([place])      Dr. Randa Evens, MD, MPH [facility] [phone] 07/10/2021 1:18 PM

## 2021-07-21 ENCOUNTER — Encounter
Admission: RE | Admit: 2021-07-21 | Discharge: 2021-07-21 | Disposition: A | Discharge status: Home / Self Care | Payer: Medicare Other | Source: Ambulatory Visit | Attending: Oncology | Admitting: Oncology

## 2021-07-21 DIAGNOSIS — I2510 Atherosclerotic heart disease of native coronary artery without angina pectoris: Secondary | ICD-10-CM | POA: Diagnosis not present

## 2021-07-21 DIAGNOSIS — C3490 Malignant neoplasm of unspecified part of unspecified bronchus or lung: Secondary | ICD-10-CM | POA: Insufficient documentation

## 2021-07-21 DIAGNOSIS — J432 Centrilobular emphysema: Secondary | ICD-10-CM | POA: Diagnosis not present

## 2021-07-21 DIAGNOSIS — G9389 Other specified disorders of brain: Secondary | ICD-10-CM | POA: Diagnosis not present

## 2021-07-21 DIAGNOSIS — C3412 Malignant neoplasm of upper lobe, left bronchus or lung: Secondary | ICD-10-CM | POA: Insufficient documentation

## 2021-07-21 LAB — GLUCOSE, CAPILLARY: Glucose-Capillary: 74 mg/dL (ref 70–99)

## 2021-07-21 MED ORDER — FLUDEOXYGLUCOSE F - 18 (FDG) INJECTION
10.0000 | Freq: Once | INTRAVENOUS | Status: AC | PRN
  Administered 2021-07-21: 10.8 via INTRAVENOUS

## 2021-07-21 MED ORDER — ALBUTEROL SULFATE HFA 108 (90 BASE) MCG/ACT IN AERS: 2.0000 | INHALATION_SPRAY | Freq: Four times a day (QID) | RESPIRATORY_TRACT | 2 refills | Status: DC | PRN

## 2021-07-28 ENCOUNTER — Ambulatory Visit: Payer: Medicare Other | Admitting: Pulmonary Disease

## 2021-07-28 ENCOUNTER — Encounter: Payer: Self-pay | Admitting: Pulmonary Disease

## 2021-07-28 ENCOUNTER — Telehealth: Payer: Self-pay | Admitting: Pulmonary Disease

## 2021-07-28 ENCOUNTER — Other Ambulatory Visit: Payer: Self-pay

## 2021-07-28 VITALS — BP 124/86 | HR 65 | Temp 97.7°F | Ht 70.0 in | Wt 196.0 lb

## 2021-07-28 DIAGNOSIS — C3412 Malignant neoplasm of upper lobe, left bronchus or lung: Secondary | ICD-10-CM | POA: Diagnosis not present

## 2021-07-28 DIAGNOSIS — J449 Chronic obstructive pulmonary disease, unspecified: Secondary | ICD-10-CM

## 2021-07-28 DIAGNOSIS — R590 Localized enlarged lymph nodes: Secondary | ICD-10-CM | POA: Diagnosis not present

## 2021-07-28 NOTE — Progress Notes (Signed)
Subjective:    Patient ID: Richard Wiggins, male    DOB: 1951-06-19, 71 y.o.   MRN: [id number] Chief Complaint  Patient presents with   Follow-up    HPI Patient is a 71 year old former smoker (quit 2018) who presents for evaluation of newly developed left hilar adenopathy.  All then in October 2021 we performed electromagnetic navigation bronchoscopy for evaluation of the left upper lobe nodule which was FDG avid.  Balaji from that sampling was nondiagnostic.  Because of the FDG avidity of the lesion and concern of non-small cell carcinoma the patient underwent SBRT.  He has been followed by both medical and radiation oncology.  He has been monitored with serial chest CTs.  CT scan of the chest from 08 July 2021 showed a left hilar lymph node measuring 3.0 by 1.2 cm and has also been noted to be intensely FDG avid by PET CT performed 21 July 2021, SUV max of this lymph node is 12.0.  We are asked to reevaluate the patient for biopsy.  Since his last visit here on 25 December 2020 the patient does not endorse any new symptomatology.  He has not had any orthopnea, paroxysmal nocturnal dyspnea or lower extremity edema.  Cough occurs perhaps only once a day mostly in the mornings it is productive of whitish sputum.  No hemoptysis.  His dyspnea has been improved with the use of Breztri.  This is only occurs mostly with heavy exertion and is relieved by rest.  Patient has not had any fevers, chills or sweats.  No other symptomatology.   DATA: 02/01/2020 2D echo: LVEF 60 to 65%, mild ventricular hypertrophy, left atrial size dilated no valvular abnormalities.  No wall motion abnormalities. 02/26/2020 PFTs: FEV1 1.85 L or 56% predicted, FVC 3.19 L or 71% predicted, FEV1/FVC 58%, significant bronchodilator response.  There was hyperinflation and air trapping noted on lung volumes.  Diffusion capacity moderately to severely reduced.  Consistent with moderate COPD with chronic bronchitis and  emphysema. 09/29/2020 CT chest: Nodule posterior aspect of the left lower lobe 2.0 x 1.2 x 1.2 cm, right upper lobe pulmonary nodule 1.0 x 0.4 cm other scattered subcentimeter pulmonary nodules.  Bronchial wall thickening with mild centrilobular and paraseptal emphysema.  Significant coronary atherosclerosis noted. 07/08/2021 CT chest: Continued interval increase size of a left hilar lymph node measuring 3.0 x 1.2 cm.  Post radiation changes left upper lobe. 07/21/2021 PET/CT: Hypermetabolic left hilar lymph node 3.0 x 1.2 cm SUV max 12.0 no other hypermetabolic lymph nodes or suspicious pulmonary nodules.  Review of Systems A 10 point review of systems was performed and it is as noted above otherwise negative.  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 ([place]) 03/11/2021   Iron deficiency anemia 02/04/2021   Long term (current) use of anticoagulants 11/12/2020   Positive colorectal cancer screening using Cologuard test 05/22/2020   Goals of care, counseling/discussion 05/06/2020   Malignant neoplasm of upper lobe of left lung ([place]) 05/06/2020   Preventative health care 03/26/2020   Atherosclerosis of native arteries of extremity with intermittent claudication ([place]) 03/14/2020   Trigger middle finger of right hand 02/20/2020   Abdominal aortic aneurysm (AAA) without rupture 02/20/2020   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   Benign essential HTN 02/10/2020   Pre-diabetes 02/10/2020   BPH (benign prostatic hyperplasia) 02/09/2020   Anemia 02/09/2020   Weakness of lower  extremity 02/01/2020   COPD (chronic obstructive pulmonary disease) (HCC) 34/19/6222   Chronic systolic CHF (congestive heart failure) ([place]) 02/01/2020   Depression 02/01/2020   A-fib ([place]) 02/01/2020   CVA (cerebral vascular accident) ([place]) 02/01/2020   Coronary artery disease 02/01/2020   Carotid  artery stenosis 02/01/2020   HLD (hyperlipidemia) 02/01/2020   Carotid stenosis, asymptomatic, left 02/14/2018   S/P coronary artery stent placement 09/20/2017   Centrilobular emphysema ([place]) 06/10/2017   Chronic back pain 06/10/2017   DOE (dyspnea on exertion) 06/10/2017   Elevated TSH 06/10/2017   Hematoma of groin 06/10/2017   PAD (peripheral artery disease) ([place]) 06/10/2017   Pulmonary embolism ([place]) 06/10/2017   Tobacco abuse 06/10/2017   Unintended weight loss 06/10/2017   AAA (abdominal aortic aneurysm) 06/10/2017   PAF (paroxysmal atrial fibrillation) ([place]) 97/98/9211   Systolic CHF, chronic ([place]) 06/10/2017   Hypertension 05/31/2017   Other hyperlipidemia 05/31/2017   Hemiparesis affecting left side as late effect of cerebrovascular accident ([place]) 05/29/2017   Carotid artery stenosis, symptomatic, right 94/17/4081   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   albuterol (VENTOLIN HFA) 108 (90 Base) MCG/ACT inhaler Inhale 2 puffs into the lungs every 6 (six) hours as needed for wheezing or shortness of breath.   aspirin EC 81 MG tablet Take 81 mg by mouth daily. Swallow whole.   atorvastatin (LIPITOR) 40 MG tablet TAKE 1 TABLET(40 MG) BY MOUTH AT BEDTIME   Budeson-Glycopyrrol-Formoterol (BREZTRI AEROSPHERE) 160-9-4.8 MCG/ACT AERO Inhale 2 puffs into the lungs in the morning and at bedtime.    carvedilol (COREG) 25 MG tablet Take 0.5 tablets (12.5 mg total) by mouth in the morning and at bedtime.   ELIQUIS 5 MG TABS tablet TAKE 1 TABLET(5 MG) BY MOUTH IN THE MORNING AND AT BEDTIME   lamoTRIgine (LAMICTAL) 100 MG tablet Take 100 mg by mouth 2 (two) times daily.   Lidocaine HCl-Benzyl Alcohol (SALONPAS LIDOCAINE PLUS EX) Place 1 patch onto the skin daily as needed (pain.).   Multiple Vitamin (MULTI-VITAMIN) tablet Take 1 tablet by mouth daily.   sacubitril-valsartan (ENTRESTO) 49-51 MG Take 0.5 tablets by mouth in the morning and at bedtime.   spironolactone (ALDACTONE) 25 MG tablet Take 0.5 tablets (12.5 mg total) by mouth at bedtime.   tamsulosin (FLOMAX) 0.4 MG CAPS capsule Take 1 capsule (0.4 mg total) by mouth daily.   Immunization History  Administered Date(s) Administered   Fluad Quad(high Dose 65+) 03/26/2020   Influenza,inj,Quad PF,6+ Mos 06/02/2017   Influenza-Unspecified 07/12/1997, 04/23/2019   Moderna Sars-Covid-2 Vaccination 09/12/2019, 10/10/2019, 04/20/2020   Pneumococcal Polysaccharide-23 06/02/2017   Tdap 02/09/2018   Zoster Recombinat (Shingrix) 05/21/2019, 10/19/2019       Objective:   Physical Exam BP 124/86 (BP Location: Left Arm, Patient Position: Sitting, Cuff Size: Normal)  Pulse 65    Temp 97.7 F (36.5 C) (Oral)    Ht 5\' 10"  (1.778 m)    Wt 196 lb (88.9 kg)    SpO2 99%    BMI 28.12 kg/m  GENERAL: Awake, alert, fully ambulatory.  Flat affect.  No conversational dyspnea.  Mild tachypnea, no distress. HEAD: Normocephalic, atraumatic. EYES: Pupils equal, round, reactive to light.  No scleral icterus. MOUTH: Nose/mouth/throat not examined due to masking requirements for COVID 19. NECK: Supple. No thyromegaly. Trachea midline. No JVD.  No adenopathy. PULMONARY: Mild tachypnea.  Good air entry bilaterally.  Coarse breath sounds, no other adventitious sounds.  CARDIOVASCULAR: S1 and S2. Regular rate and rhythm.  No rubs, murmurs or gallops  heard. GASTROINTESTINAL: Protuberant abdomen, otherwise benign. MUSCULOSKELETAL: No joint deformity, no clubbing, no edema. NEUROLOGIC: No focal deficit, no gait disturbance, speech is fluent. SKIN: Intact,warm,dry.  Limited exam no rashes. PSYCH: Flat affect, calm and cooperative.         Assessment & Plan:     ICD-10-CM   1. Hilar adenopathy  R59.0    Patient has been scheduled for EBUS 12 August 2021 Patient understands need to withhold Eliquis for 3 days prior to procedure    2. 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 Stage 2 moderate COPD by GOLD classification ([place])  J44.9    Well compensated on Breztri 2 puffs twice a day Continue Breztri     Will undergo EBUS with TBNA on 12 August 2021.  Patient understands that he will require general anesthesia.  Patient also understands the need to withhold Eliquis for 3 days prior to the procedure.  Renold Don, MD Advanced Bronchoscopy PCCM [facility]    *This note was dictated using voice recognition software/Dragon.  Despite best efforts to proofread, errors can occur which can change the meaning. Any transcriptional errors that result from this process are unintentional and may not be fully corrected at the time of dictation.

## 2021-07-28 NOTE — Telephone Encounter (Signed)
Phone pre admit visit 08/05/2021 between 8-1 and covid test 08/10/2021 at 8:40.   Lm for patient.

## 2021-07-28 NOTE — Progress Notes (Incomplete)
° °  Subjective:    Patient ID: Richard Wiggins, male    DOB: [birth year], 71 y.o.   MRN: [id number] Chief Complaint  Patient presents with   Follow-up    HPI    Review of Systems A 10 point review of systems was performed and it is as noted above otherwise negative.    Objective:   Physical Exam BP 124/86 (BP Location: Left Arm, Patient Position: Sitting, Cuff Size: Normal)    Pulse 65    Temp 97.7 F (36.5 C) (Oral)    Ht 5\' 10"  (1.778 m)    Wt 196 lb (88.9 kg)    SpO2 99%    BMI 28.12 kg/m         Assessment & Plan:

## 2021-07-28 NOTE — Patient Instructions (Signed)
We have scheduled the procedure for 12 August 2021  Procedure will be at 1230 you will get instructions of when to arrive and instructions about your preprocedure COVID test etc.  You need to stop your Eliquis 3 days prior to the procedure.   We will see him in follow-up in 4 to 6 weeks time call sooner should any new problems arise.  We will stay in contact with you throughout the procedure.

## 2021-07-28 NOTE — Telephone Encounter (Signed)
EBUS scheduled 08/12/2021 at 12:30. HQ:PRFF nodule  CPT: [id number], [id number]  Rodena Piety, please see bronch info.

## 2021-07-28 NOTE — H&P (View-Only) (Signed)
Subjective:    Patient ID: Richard Wiggins, male    DOB: 1950/09/09, 71 y.o.   MRN: [id number] Chief Complaint  Patient presents with   Follow-up    HPI Patient is a 71 year old former smoker (quit 2018) who presents for evaluation of newly developed left hilar adenopathy.  All then in October 2021 we performed electromagnetic navigation bronchoscopy for evaluation of the left upper lobe nodule which was FDG avid.  Balaji from that sampling was nondiagnostic.  Because of the FDG avidity of the lesion and concern of non-small cell carcinoma the patient underwent SBRT.  He has been followed by both medical and radiation oncology.  He has been monitored with serial chest CTs.  CT scan of the chest from 08 July 2021 showed a left hilar lymph node measuring 3.0 by 1.2 cm and has also been noted to be intensely FDG avid by PET CT performed 21 July 2021, SUV max of this lymph node is 12.0.  We are asked to reevaluate the patient for biopsy.  Since his last visit here on 25 December 2020 the patient does not endorse any new symptomatology.  He has not had any orthopnea, paroxysmal nocturnal dyspnea or lower extremity edema.  Cough occurs perhaps only once a day mostly in the mornings it is productive of whitish sputum.  No hemoptysis.  His dyspnea has been improved with the use of Breztri.  This is only occurs mostly with heavy exertion and is relieved by rest.  Patient has not had any fevers, chills or sweats.  No other symptomatology.   DATA: 02/01/2020 2D echo: LVEF 60 to 65%, mild ventricular hypertrophy, left atrial size dilated no valvular abnormalities.  No wall motion abnormalities. 02/26/2020 PFTs: FEV1 1.85 L or 56% predicted, FVC 3.19 L or 71% predicted, FEV1/FVC 58%, significant bronchodilator response.  There was hyperinflation and air trapping noted on lung volumes.  Diffusion capacity moderately to severely reduced.  Consistent with moderate COPD with chronic bronchitis and  emphysema. 09/29/2020 CT chest: Nodule posterior aspect of the left lower lobe 2.0 x 1.2 x 1.2 cm, right upper lobe pulmonary nodule 1.0 x 0.4 cm other scattered subcentimeter pulmonary nodules.  Bronchial wall thickening with mild centrilobular and paraseptal emphysema.  Significant coronary atherosclerosis noted. 07/08/2021 CT chest: Continued interval increase size of a left hilar lymph node measuring 3.0 x 1.2 cm.  Post radiation changes left upper lobe. 07/21/2021 PET/CT: Hypermetabolic left hilar lymph node 3.0 x 1.2 cm SUV max 12.0 no other hypermetabolic lymph nodes or suspicious pulmonary nodules.  Review of Systems A 10 point review of systems was performed and it is as noted above otherwise negative.  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 ([place]) 03/11/2021   Iron deficiency anemia 02/04/2021   Long term (current) use of anticoagulants 11/12/2020   Positive colorectal cancer screening using Cologuard test 05/22/2020   Goals of care, counseling/discussion 05/06/2020   Malignant neoplasm of upper lobe of left lung ([place]) 05/06/2020   Preventative health care 03/26/2020   Atherosclerosis of native arteries of extremity with intermittent claudication ([place]) 03/14/2020   Trigger middle finger of right hand 02/20/2020   Abdominal aortic aneurysm (AAA) without rupture 02/20/2020   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   Benign essential HTN 02/10/2020   Pre-diabetes 02/10/2020   BPH (benign prostatic hyperplasia) 02/09/2020   Anemia 02/09/2020   Weakness of lower  extremity 02/01/2020   COPD (chronic obstructive pulmonary disease) (HCC) 53/74/8270   Chronic systolic CHF (congestive heart failure) ([place]) 02/01/2020   Depression 02/01/2020   A-fib ([place]) 02/01/2020   CVA (cerebral vascular accident) ([place]) 02/01/2020   Coronary artery disease 02/01/2020   Carotid  artery stenosis 02/01/2020   HLD (hyperlipidemia) 02/01/2020   Carotid stenosis, asymptomatic, left 02/14/2018   S/P coronary artery stent placement 09/20/2017   Centrilobular emphysema ([place]) 06/10/2017   Chronic back pain 06/10/2017   DOE (dyspnea on exertion) 06/10/2017   Elevated TSH 06/10/2017   Hematoma of groin 06/10/2017   PAD (peripheral artery disease) ([place]) 06/10/2017   Pulmonary embolism ([place]) 06/10/2017   Tobacco abuse 06/10/2017   Unintended weight loss 06/10/2017   AAA (abdominal aortic aneurysm) 06/10/2017   PAF (paroxysmal atrial fibrillation) ([place]) 78/67/5449   Systolic CHF, chronic ([place]) 06/10/2017   Hypertension 05/31/2017   Other hyperlipidemia 05/31/2017   Hemiparesis affecting left side as late effect of cerebrovascular accident ([place]) 05/29/2017   Carotid artery stenosis, symptomatic, right 20/04/711   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   albuterol (VENTOLIN HFA) 108 (90 Base) MCG/ACT inhaler Inhale 2 puffs into the lungs every 6 (six) hours as needed for wheezing or shortness of breath.   aspirin EC 81 MG tablet Take 81 mg by mouth daily. Swallow whole.   atorvastatin (LIPITOR) 40 MG tablet TAKE 1 TABLET(40 MG) BY MOUTH AT BEDTIME   Budeson-Glycopyrrol-Formoterol (BREZTRI AEROSPHERE) 160-9-4.8 MCG/ACT AERO Inhale 2 puffs into the lungs in the morning and at bedtime.    carvedilol (COREG) 25 MG tablet Take 0.5 tablets (12.5 mg total) by mouth in the morning and at bedtime.   ELIQUIS 5 MG TABS tablet TAKE 1 TABLET(5 MG) BY MOUTH IN THE MORNING AND AT BEDTIME   lamoTRIgine (LAMICTAL) 100 MG tablet Take 100 mg by mouth 2 (two) times daily.   Lidocaine HCl-Benzyl Alcohol (SALONPAS LIDOCAINE PLUS EX) Place 1 patch onto the skin daily as needed (pain.).   Multiple Vitamin (MULTI-VITAMIN) tablet Take 1 tablet by mouth daily.   sacubitril-valsartan (ENTRESTO) 49-51 MG Take 0.5 tablets by mouth in the morning and at bedtime.   spironolactone (ALDACTONE) 25 MG tablet Take 0.5 tablets (12.5 mg total) by mouth at bedtime.   tamsulosin (FLOMAX) 0.4 MG CAPS capsule Take 1 capsule (0.4 mg total) by mouth daily.   Immunization History  Administered Date(s) Administered   Fluad Quad(high Dose 65+) 03/26/2020   Influenza,inj,Quad PF,6+ Mos 06/02/2017   Influenza-Unspecified 07/12/1997, 04/23/2019   Moderna Sars-Covid-2 Vaccination 09/12/2019, 10/10/2019, 04/20/2020   Pneumococcal Polysaccharide-23 06/02/2017   Tdap 02/09/2018   Zoster Recombinat (Shingrix) 05/21/2019, 10/19/2019       Objective:   Physical Exam BP 124/86 (BP Location: Left Arm, Patient Position: Sitting, Cuff Size: Normal)  Pulse 65    Temp 97.7 F (36.5 C) (Oral)    Ht 5\' 10"  (1.778 m)    Wt 196 lb (88.9 kg)    SpO2 99%    BMI 28.12 kg/m  GENERAL: Awake, alert, fully ambulatory.  Flat affect.  No conversational dyspnea.  Mild tachypnea, no distress. HEAD: Normocephalic, atraumatic. EYES: Pupils equal, round, reactive to light.  No scleral icterus. MOUTH: Nose/mouth/throat not examined due to masking requirements for COVID 19. NECK: Supple. No thyromegaly. Trachea midline. No JVD.  No adenopathy. PULMONARY: Mild tachypnea.  Good air entry bilaterally.  Coarse breath sounds, no other adventitious sounds.  CARDIOVASCULAR: S1 and S2. Regular rate and rhythm.  No rubs, murmurs or gallops  heard. GASTROINTESTINAL: Protuberant abdomen, otherwise benign. MUSCULOSKELETAL: No joint deformity, no clubbing, no edema. NEUROLOGIC: No focal deficit, no gait disturbance, speech is fluent. SKIN: Intact,warm,dry.  Limited exam no rashes. PSYCH: Flat affect, calm and cooperative.         Assessment & Plan:     ICD-10-CM   1. Hilar adenopathy  R59.0    Patient has been scheduled for EBUS 12 August 2021 Patient understands need to withhold Eliquis for 3 days prior to procedure    2. 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 Stage 2 moderate COPD by GOLD classification ([place])  J44.9    Well compensated on Breztri 2 puffs twice a day Continue Breztri     Will undergo EBUS with TBNA on 12 August 2021.  Patient understands that he will require general anesthesia.  Patient also understands the need to withhold Eliquis for 3 days prior to the procedure.  Renold Don, MD Advanced Bronchoscopy PCCM [facility]    *This note was dictated using voice recognition software/Dragon.  Despite best efforts to proofread, errors can occur which can change the meaning. Any transcriptional errors that result from this process are unintentional and may not be fully corrected at the time of dictation.

## 2021-07-29 NOTE — Telephone Encounter (Signed)
Lm x2 for patient.  Will call once more due to nature of call.

## 2021-07-29 NOTE — Telephone Encounter (Signed)
Butch Penny would like bronch to be filed under [place] [facility].  Routing to [place] as an Pharmacist, hospital.

## 2021-07-29 NOTE — Telephone Encounter (Signed)
Patient's spouse, Donna(DPR) is aware of below dates/times and voiced her understanding.

## 2021-07-31 ENCOUNTER — Encounter: Payer: Self-pay | Admitting: Pulmonary Disease

## 2021-07-31 NOTE — Telephone Encounter (Signed)
Noted.  Will close encounter.  

## 2021-07-31 NOTE — Telephone Encounter (Signed)
Per [facility] Prior Auth Not required for the codes [phone], [phone] Refer # [id number]

## 2021-08-05 ENCOUNTER — Other Ambulatory Visit: Payer: Self-pay

## 2021-08-05 ENCOUNTER — Other Ambulatory Visit
Admission: RE | Admit: 2021-08-05 | Discharge: 2021-08-05 | Disposition: A | Discharge status: Home / Self Care | Payer: No Typology Code available for payment source | Source: Ambulatory Visit | Attending: Pulmonary Disease | Admitting: Pulmonary Disease

## 2021-08-05 ENCOUNTER — Telehealth: Payer: Self-pay | Admitting: *Deleted

## 2021-08-05 DIAGNOSIS — Z01812 Encounter for preprocedural laboratory examination: Secondary | ICD-10-CM

## 2021-08-05 HISTORY — DX: Prediabetes: R73.03

## 2021-08-05 NOTE — Telephone Encounter (Signed)
Request for pre-operative cardiac clearance Received: Today Karen Kitchens, NP  P Cv Div Preop Callback Request for pre-operative cardiac clearance:     1. What type of surgery is being performed?  ENB/EBUS   2. When is this surgery scheduled?  08/12/2021     3. Type of clearance being requested (medical, pharmacy, both).  Both     4. Are there any medications that need to be held prior to surgery?  Apixaban x 3 days prior to procedure (last dose 08/08/2021).   5. Practice name and name of physician performing surgery?  Performing surgeon: Dr. [name], MD  Requesting clearance: Honor Loh, FNP-C       6. Anesthesia type (none, local, MAC, general)?  GENERAL   7. What is the office phone and fax number?    [phone] (fax)   ATTENTION: Unable to create telephone message as per your standard workflow. Directed by [facility] providers to send requests for cardiac clearance to this pool for appropriate distribution to provider covering pre-operative clearances.   Honor Loh, MSN, APRN, FNP-C, CEN  [facility]  Peri-operative Services Nurse Practitioner  Phone: [phone]  08/05/21 1:00 PM

## 2021-08-05 NOTE — Telephone Encounter (Signed)
Patient with diagnosis of A Fib on Eliquis for anticoagulation.    Procedure: ENB/EBUS   Date of procedure: 08/12/21   CHA2DS2-VASc Score = 6  This indicates a 9.7% annual risk of stroke. The patient's score is based upon: CHF History: 1 HTN History: 1 Diabetes History: 0 Stroke History: 2 Vascular Disease History: 1 Age Score: 1 Gender Score: 0   CrCl 45 mL/min Platelet count 153K   Patient previously cleared to hold Eliquis for 2 days for dental procedure.  Ok to hold 2 days for current procedure. If surgeon requires longer than a 2 day hold, will require authorization from cardiologist

## 2021-08-05 NOTE — Patient Instructions (Addendum)
Your procedure is scheduled on: 08/12/21 - Wednesday Report to the Registration Desk on the 1st floor of the [facility]. To find out your arrival time, please call [phone] between 1PM - 3PM on: 08/11/21 - Tuesday  Report to [facility] for Labs and Covid Test on 08/10/21 at 8:30 am.  REMEMBER: Instructions that are not followed completely may result in serious medical risk, up to and including death; or upon the discretion of your surgeon and anesthesiologist your surgery may need to be rescheduled.  Do not eat food or drink any fluids after midnight the night before surgery.  No gum chewing, lozengers or hard candies.  TAKE THESE MEDICATIONS THE MORNING OF SURGERY WITH A SIP OF WATER:  - BREZTRI - carvedilol (COREG)  - lamoTRIgine (LAMICTAL)    Use albuterol (PROVENTIL) (2.5 MG/3ML) 0.083% on the day of surgery and bring to the hospital.  Follow recommendations from Cardiologist, Pulmonologist or PCP regarding stopping Aspirin, Coumadin, Plavix, Eliquis, Pradaxa, or Pletal. Stop taking ELIQUIS 5 MG TABS tablet beginning 01/29, may resume with MD order.  One week prior to surgery: Stop Anti-inflammatories (NSAIDS) such as Advil, Aleve, Ibuprofen, Motrin, Naproxen, Naprosyn and Aspirin based products such as Excedrin, Goodys Powder, BC Powder.  Stop ANY OVER THE COUNTER supplements until after surgery.  You may however, continue to take Tylenol if needed for pain up until the day of surgery.  No Alcohol for 24 hours before or after surgery.  No Smoking including e-cigarettes for 24 hours prior to surgery.  No chewable tobacco products for at least 6 hours prior to surgery.  No nicotine patches on the day of surgery.  Do not use any "recreational" drugs for at least a week prior to your surgery.  Please be advised that the combination of cocaine and anesthesia may have negative outcomes, up to and including death. If you test positive for cocaine, your surgery will  be cancelled.  On the morning of surgery brush your teeth with toothpaste and water, you may rinse your mouth with mouthwash if you wish. Do not swallow any toothpaste or mouthwash.  Do not wear jewelry, make-up, hairpins, clips or nail polish.  Do not wear lotions, powders, or perfumes.   Do not shave body from the neck down 48 hours prior to surgery just in case you cut yourself which could leave a site for infection.  Also, freshly shaved skin may become irritated if using the CHG soap.  Contact lenses, hearing aids and dentures may not be worn into surgery.  Do not bring valuables to the hospital. [facility] is not responsible for any missing/lost belongings or valuables.   Notify your doctor if there is any change in your medical condition (cold, fever, infection).  Wear comfortable clothing (specific to your surgery type) to the hospital.  After surgery, you can help prevent lung complications by doing breathing exercises.  Take deep breaths and cough every 1-2 hours. Your doctor may order a device called an Incentive Spirometer to help you take deep breaths. When coughing or sneezing, hold a pillow firmly against your incision with both hands. This is called splinting. Doing this helps protect your incision. It also decreases belly discomfort.  If you are being admitted to the hospital overnight, leave your suitcase in the car. After surgery it may be brought to your room.  If you are being discharged the day of surgery, you will not be allowed to drive home. You will need a responsible adult (  18 years or older) to drive you home and stay with you that night.   If you are taking public transportation, you will need to have a responsible adult (18 years or older) with you. Please confirm with your physician that it is acceptable to use public transportation.   Please call the [facility] Dept. at [phone] if you have any questions about these  instructions.  Surgery Visitation Policy:  Patients undergoing a surgery or procedure may have one family member or support person with them as long as that person is not COVID-19 positive or experiencing its symptoms.  That person may remain in the waiting area during the procedure and may rotate out with other people.  Inpatient Visitation:    Visiting hours are 7 a.m. to 8 p.m. Up to two visitors ages 16+ are allowed at one time in a patient room. The visitors may rotate out with other people during the day. Visitors must check out when they leave, or other visitors will not be allowed. One designated support person may remain overnight. The visitor must pass COVID-19 screenings, use hand sanitizer when entering and exiting the patients room and wear a mask at all times, including in the patients room. Patients must also wear a mask when staff or their visitor are in the room. Masking is required regardless of vaccination status.

## 2021-08-07 ENCOUNTER — Encounter: Payer: Self-pay | Admitting: Pulmonary Disease

## 2021-08-09 ENCOUNTER — Encounter: Payer: Self-pay | Admitting: Pulmonary Disease

## 2021-08-09 NOTE — Progress Notes (Signed)
Perioperative Services  Pre-Admission/Anesthesia Testing Clinical Review  Date: 08/10/21  Patient Demographics:  Name: Richard Wiggins DOB:   [birth year] MRN:   [id number]  Planned Surgical Procedure(s):    Case: [id number] Date/Time: 08/12/21 1230   Procedure: VIDEO BRONCHOSCOPY WITH ENDOBRONCHIAL ULTRASOUND (Left)   Anesthesia type: General   Pre-op diagnosis: LUNG NODULE LEFT   Location: [facility] PROCEDURE RM 02 / [facility] FOR ANESTHESIA GROUP   Surgeons: Tyler Pita, MD   NOTE: Available PAT nursing documentation and vital signs have been reviewed. Clinical nursing staff has updated patient's PMH/PSHx, current medication list, and drug allergies/intolerances to ensure comprehensive history available to assist in medical decision making as it pertains to the aforementioned surgical procedure and anticipated anesthetic course. Extensive review of available clinical information performed. Richard Wiggins PMH and PSHx updated with any diagnoses/procedures that  may have been inadvertently omitted during his intake with the pre-admission testing department's nursing staff.  Clinical Discussion:  Richard Wiggins is a 71 y.o. male who is submitted for pre-surgical anesthesia review and clearance prior to him undergoing the above procedure. Patient is a Former Smoker (106 pack years; quit 05/2017). Pertinent PMH includes: CAD, atrial fibrillation, HFimpEF, ischemic cardiomyopathy, AAA, pulmonary embolism, carotid artery disease, CVA, brain aneurysm, aortic atherosclerosis, ascending aorta dilatation, claudication, HTN, HLD, prediabetes, COPD, pulmonary nodules, previous LUL lung cancer (s/p SBRT), CKD-III, seizures, IDA, BPH, marijuana use, anxiety, depression.  Patient is followed by cardiology Garen Lah, MD). He was last seen in the cardiology clinic on 03/11/2021; notes reviewed.  At the time of his clinic visit, patient doing well overall from a cardiovascular perspective.  He denied any  episodes of chest pain, increased shortness of breath, PND, orthopnea, palpitations, significant peripheral edema, palpitations, vertiginous symptoms, or presyncope/syncope.  Patient with complaints of fatigue, lower extremity weakness, and intermittent claudication pain.  Of note, patient has been worked up by vascular surgery and found to have normal ABIs (09/2020).  Additionally, patient is under the care of hematology for iron deficiency anemia with plans for iron infusions.  Patient with a past medical history significant for cardiovascular diagnoses.  Patient suffered a CVA in 05/2017 and was hospitalized while living and [place]. Noted to be in atrial fibrillation upon arrival to the hospital. Patient treated with thrombolytics, however later found to have significant carotid artery disease. He ultimately underwent a RIGHT CEA.  Hospital course complicated by the development of a pulmonary embolism leading to prolonged admission.  Patient was discharged home on chronic anticoagulation.  TTE performed on 06/08/2017 in [place] revealed patient's significantly reduced LVEF of 30-35%.  He subsequently underwent left heart catheterization revealing multivessel CAD; "severe" RCA disease, 50% LAD and 20% LCx.  PCI placing DES x1 to the RCA.  Repeat TTE performed on 02/01/2020 revealed normal left ventricular systolic function with an EF of 60-65%.  Left atrium was mildly dilated. There is no evidence of significant valvular regurgitation or transvalvular gradient suggestive of stenosis.     Vascular abdominal ultrasound performed on 09/12/2020 revealing dilatation of the mid, proximal, and distal abdominal aorta with the largest aortic measurement being 4.1 cm.  Last TTE was performed on 02/24/2021 revealing a normal left ventricular systolic function with an EF of 55-60%.  There was trivial mitral valve regurgitation.  No evidence of valvular stenosis noted.  Again patient with paroxysmal atrial  fibrillation diagnosis; CHA2DS2-VASc Score = 6 (age, CHF, HTN, CVA x 2, PVD).  Rate and rhythm managed on daily carvedilol.  He is chronically anticoagulated using  apixaban; compliant with therapy with no evidence or reports of GI bleeding.  HFimpEF and blood pressure well controlled on currently prescribed beta-blocker, diuretic, ARB/ANRi therapies; blood pressure documented at 108/60.  Patient is on a statin for his HLD.  Patient with a prediabetes diagnosis; last Hgb A1c was documented at 5.7 when checked on 02/01/2020.  Functional capacity limited by underlying cardiopulmonary diagnoses, residual effects of his CVA, and deconditioning.  He was felt to be able to achieve at least 4 METS of activity without angina/anginal symptoms.  No changes were made to his medication regimen.  Patient to follow-up with outpatient cardiology in 6 months or sooner if needed.  Richard Wiggins found to have hypermetabolic enlarging posterior LEFT hilar lymphadenopathy consistent with local recurrence of previous lung cancer.  There was additional hypermetabolic activity superior to the LEFT hilum, which may reflect an intrapulmonary nodal or pulmonary metastasis.  Of note, patient has undergone ENB/EBUS in 2021, however tissue sampling yielded a non-diagnostic biopsy.  Patient subsequently met with radiation oncology and underwent SBRT to his LEFT upper lobe for presumed stage I NSCLC.  Given concern for recurrence following repeat pet imaging, patient has been scheduled for a repeat ENB/EBUS for direct visualization and tissue sampling.  Given patient's past medical history significant for cardiovascular diagnoses, presurgical cardiac clearance was sought by the PAT team.  Per cardiology, "based ACC/AHA guidelines, the patient's past medical history, and the amount of time since his last clinic visit, this patient would be at an overall ACCEPTABLE risk for the planned procedure without further cardiovascular testing or  intervention at this time". Again patient is on daily anticoagulation therapy.  He has been instructed on recommendations from his primary pulmonary medicine provider to hold his apixaban for 3 days prior to his procedure with plans to restart as soon as postoperative bleeding risk felt to be minimized.  The patient is aware that his last dose of apixaban should be on 08/08/2021.  Patient denies previous perioperative complications with anesthesia in the past. In review of the available records, it is noted that patient underwent a general anesthetic course here (ASA III) in 07/2020 without documented complications.   Vitals with BMI 07/28/2021 04/08/2021 03/11/2021  Height '5\' 10"'  - '5\' 10"'   Weight 196 lbs 193 lbs 11 oz 196 lbs  BMI 58.83 - 25.49  Systolic [phone]  Diastolic 86 83 60  Pulse 65 72 59    Providers/Specialists:   NOTE: Primary physician provider listed below. Patient may have been seen by APP or partner within same practice.   PROVIDER ROLE / SPECIALTY LAST Sherrian Divers, MD Pulmonary Medicine 07/28/2021  Chrismon, Vickki Muff, PA-C Primary Care Provider 11/06/2020  Kate Sable, MD Cardiology 03/11/2021  Randa Evens, MD Hematology / Medical Oncology 07/10/2021  Noreene Filbert, MD Radiation Oncology 04/08/2021  [name], MD Vascular Surgery 02/03/2021   Allergies:  Tape and Wound dressing adhesive  Current Home Medications:   No current facility-administered medications for this encounter.    albuterol (PROVENTIL) (2.5 MG/3ML) 0.083% nebulizer solution   albuterol (VENTOLIN HFA) 108 (90 Base) MCG/ACT inhaler   aspirin EC 81 MG tablet   atorvastatin (LIPITOR) 40 MG tablet   Budeson-Glycopyrrol-Formoterol (BREZTRI AEROSPHERE) 160-9-4.8 MCG/ACT AERO   carvedilol (COREG) 25 MG tablet   ELIQUIS 5 MG TABS tablet   furosemide (LASIX) 40 MG tablet   lamoTRIgine (LAMICTAL) 100 MG tablet   Lidocaine HCl-Benzyl Alcohol (SALONPAS LIDOCAINE PLUS EX)    Multiple Vitamin (MULTI-VITAMIN)  tablet   sacubitril-valsartan (ENTRESTO) 49-51 MG   spironolactone (ALDACTONE) 25 MG tablet   tamsulosin (FLOMAX) 0.4 MG CAPS capsule   History:   Past Medical History:  Diagnosis Date   AAA (abdominal aortic aneurysm) 2020   a. 2020 U/S: 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 -plan medical managmment with tight bp control; b. 09/2020 Abd U/S: 4.1cm.   Anxiety    Aortic atherosclerosis (HCC)    Benign essential HTN 02/10/2020   BPH (benign prostatic hyperplasia) 02/09/2020   Brain aneurysm    Cancer of upper lobe of left lung ([place]) 04/2020   a.) ENB/EBUS performed; Bx non-diagnostic. b.) presumed stage I NSCLC in the LUL; underwent SBRT (60 Gy over 5 fractions)   Carotid arterial disease ([place])    a. 05/2017 s/p R carotid endarterectomy following CVA.   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 2018 Echo: EF 30-35%; b. 02/2021 Echo: EF 55-60%, nl RV fxn. Triv MR. Asc Ao 34m.   CKD (chronic kidney disease), stage III (HCC)    Claudication ([place]    a. 09/2020 ABIs: R 0.91. L 0.99.   COPD (chronic obstructive pulmonary disease) (HCC)    Coronary artery disease    a. 06/2017 PCI ([place]): s/p PCI to the RCA. LAD 50, LCX 20.   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 02/2021 Asc Ao 359m   Emphysema of lung (HCC)    HLD (hyperlipidemia)    IDA (iron deficiency anemia) 02/09/2020   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   Ischemic cardiomyopathy    Long term current use of anticoagulant    a.) apixaban   Marijuana use, continuous 02/10/2020   Osteoporosis    PAF (paroxysmal atrial fibrillation) ([facility]   a. 05/2017 Dx in setting of CVA. CHA2DS2VASc = 6-->Eliquis.   Pre-diabetes    Pulmonary emboli ([facility]   Right lower lobe pulmonary embolism small segmental, multifocal multifocal pneumonia, mediastinal lymphadenopathy, moderate centrilobular emphysema   Seizures ([facility]   Sigmoid  diverticulosis    a.) CT 01/27/2021: severe   [facility]   a. 05/2017 - hospitalized in [place]->prolonged hospitalization in setting of R CEA, PE, and finding of RCA dzs on cath; b. Residual left sided weakness to arm and leg.   Past Surgical History:  Procedure Laterality Date   brain aneurysm with clip     COLONOSCOPY WITH PROPOFOL N/A 07/23/2020   Procedure: COLONOSCOPY WITH PROPOFOL;  Surgeon: VaLin LandsmanMD;  [facility];  Service: Gastroenterology;  Laterality: N/A;   CORONARY ANGIOPLASTY WITH STENT PLACEMENT  07/08/2017   TOTAL HIP ARTHROPLASTY Right    VIDEO BRONCHOSCOPY WITH ENDOBRONCHIAL NAVIGATION N/A 04/23/2020   Procedure: VIDEO BRONCHOSCOPY WITH ENDOBRONCHIAL NAVIGATION;  Surgeon: GoTyler PitaMD;  [facility];  Service: Pulmonary;  Laterality: N/A;   Family History  Problem Relation Age of Onset   Diabetes Mother    Heart disease Mother    Stroke Father    Heart disease Father    Heart attack Father    Alcohol abuse Father    Heart disease Sister    Heart attack Sister    Heart disease Maternal Grandmother    Heart attack Maternal Grandmother    Heart attack Paternal Grandmother    Diabetes Brother    Heart disease Brother    Heart attack Brother    Social History   Tobacco Use  Smoking status: Former    Packs/day: 2.00    Years: 53.00    Pack years: 106.00    Types: Cigarettes    Quit date: 05/28/2017    Years since quitting: 4.2   Smokeless tobacco: Never   Tobacco comments:    Quit in 2018  Vaping Use   Vaping Use: Former   Quit date: 01/15/2020   Devices: cbd   Substance Use Topics   Alcohol use: Yes    Comment: socially drink cocktail   Drug use: Not Currently    Comment: last smoke x1 month ago    Pertinent Clinical Results:  LABS: Labs reviewed: Acceptable for surgery.  Hospital Outpatient Visit on 08/10/2021  Component Date Value Ref Range Status   WBC 08/10/2021 4.4  4.0 - 10.5 K/uL Final   RBC  08/10/2021 3.03 (L)  4.22 - 5.81 MIL/uL Final   Hemoglobin 08/10/2021 8.8 (L)  13.0 - 17.0 g/dL Final   HCT 08/10/2021 29.0 (L)  39.0 - 52.0 % Final   MCV 08/10/2021 95.7  80.0 - 100.0 fL Final   MCH 08/10/2021 29.0  26.0 - 34.0 pg Final   MCHC 08/10/2021 30.3  30.0 - 36.0 g/dL Final   RDW 08/10/2021 15.8 (H)  11.5 - 15.5 % Final   Platelets 08/10/2021 155  150 - 400 K/uL Final   nRBC 08/10/2021 0.0  0.0 - 0.2 % Final   Performed at [facility], [address] [place], [place] [address]   Sodium 08/10/2021 137  135 - 145 mmol/L Final   Potassium 08/10/2021 4.8  3.5 - 5.1 mmol/L Final   Chloride 08/10/2021 103  98 - 111 mmol/L Final   CO2 08/10/2021 27  22 - 32 mmol/L Final   Glucose, Bld 08/10/2021 78  70 - 99 mg/dL Final   Glucose reference range applies only to samples taken after fasting for at least 8 hours.   BUN 08/10/2021 29 (H)  8 - 23 mg/dL Final   Creatinine, Ser 08/10/2021 1.43 (H)  0.61 - 1.24 mg/dL Final   Calcium 08/10/2021 9.1  8.9 - 10.3 mg/dL Final   GFR, Estimated 08/10/2021 52 (L)  >60 mL/min Final   Comment: (NOTE) Calculated using the CKD-EPI Creatinine Equation (2021)    Anion gap 08/10/2021 7  5 - 15 Final   Performed at [facility], [address] [place], [place] [address]    ECG: Date: 03/11/2021 Time ECG obtained: 1507 PM Rate: 59 bpm Rhythm: sinus bradycardia Axis (leads I and aVF): Normal Intervals: PR 148 ms. QRS 72 ms. QTc 384 ms. ST segment and T wave changes: No evidence of acute ST segment elevation or depression Comparison: Similar to previous tracing obtained on 07/07/2020   IMAGING / PROCEDURES: NM PET IMAGING RESTAGING GOAL BASE TO THIGH performed on 07/21/2021 The enlarging posterior left hilar lymph node is hypermetabolic, consistent with local recurrence of lung cancer. In addition, there is additional hypermetabolic activity superior to the left hilum which may reflect an intrapulmonary nodal or pulmonary  metastasis. No other evidence of metastatic disease. Treated primary lesion posteriorly in the left upper lobe with surrounding radiation changes, but no significant hypermetabolic activity. 4.3 cm abdominal aortic aneurysm, not significantly changed from CTA 01/27/2021. See follow up recommendations from that examination. Aortic atherosclerosis  Emphysema   CT CHEST WITHOUT CONTRAST performed on 07/08/2021 Solid left upper lobe pulmonary nodule is similar in size, but demonstrates increased surrounding consolidations and ground-glass opacities, findings are likely due  to evolving post radiation change. Continued interval increased size of left hilar lymph node, concerning for metastatic disease. Additional previously seen solid pulmonary nodules are stable when compared with prior exam. Aortic atherosclerosis  Emphysema   AAA Duplex performed on 03/13/2021 There is evidence of abnormal dilatation of the distal abdominal aorta. The largest aortic measurement is 3.9 cm. Moderate atherosclerosis. Proximal left CIA appears moderately stenosed. Upper limits normal caliber right CIA. Lt CIA is of normal caliber.  The largest aortic diameter remains essentially unchanged compared to prior exam. Previous diameter measurement was 4.1 cm obtained on 09/16/2020.     PULMONARY FUNCTION TESTING performed on 03/05/2021   TRANSTHORACIC ECHOCARDIOGRAM performed on 02/24/2021 Left ventricular ejection fraction, by estimation, is 55 to 60%. The left ventricle has normal function. Left ventricular endocardial border not optimally defined to evaluate regional wall motion. Left ventricular diastolic parameters were normal.  Right ventricular systolic function is normal. The right ventricular size is normal. Tricuspid regurgitation signal is inadequate for assessing PA pressure.  The mitral valve is grossly normal. Trivial mitral valve regurgitation.  The aortic valve was not well visualized. Aortic valve  regurgitation is not visualized. No aortic stenosis is present.  There is mild dilatation of the ascending aorta, measuring 38 mm.  The inferior vena cava is normal in size with <50% respiratory variability, suggesting right atrial pressure of 8 mmHg.   CT AGIO AORTOBIFEMORAL W AND/OR WITHOUT CONTRAST performed on 01/27/2021 4.2 cm infrarenal abdominal aortic aneurysm. Recommend follow-up every 12 months and vascular consultation.  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 No intramural hematoma or rupture. 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 Occlusion of the inferior mesenteric artery with reconstitution by the marginal artery. Multiple serial stenoses of the right lower extremity arterial inflow, outflow, and runoff with serial 50% stenoses of the right lower extremity arterial inflow,, tandem stenoses of the profundus femoral artery origin and distal right SFA, and focal stenosis of the a P2 segment of the right popliteal artery. Superimposed short segment non flow limiting dissection of the right external iliac artery. Three-vessel runoff to the right foot. Multiple serial stenoses involving the left lower extremity arterial inflow and runoff including a focal 50% stenosis of the left external iliac artery and occlusion of the left posterior tibial artery. Two vessel runoff to the left foot. Severe sigmoid diverticulosis.  VASCULAR US ABI WITH/WO TBI performed on 09/12/2020 Right: Resting right ankle-brachial index indicates mild right lower extremity arterial disease. The right toe-brachial index is abnormal.  Left: Resting left ankle-brachial index is within normal range. No evidence of significant left lower extremity arterial disease. The left  toe-brachial index is normal.   Impression and Plan:  Richard Wiggins has been referred for pre-anesthesia review and clearance prior  to him undergoing the planned anesthetic and procedural courses. Available labs, pertinent testing, and imaging results were personally reviewed by me. This patient has been appropriately cleared by cardiology with an overall ACCEPTABLE risk of significant perioperative cardiovascular complications.  Based on clinical review performed today (08/10/21), barring any significant acute changes in the patient's overall condition, it is anticipated that he will be able to proceed with the planned surgical intervention. Any acute changes in clinical condition may necessitate his procedure being postponed and/or cancelled. Patient will meet with anesthesia team (MD and/or CRNA) on the day of his procedure for preoperative evaluation/assessment. Questions regarding anesthetic course will be fielded at that  time.   Pre-surgical instructions were reviewed with the patient during his PAT appointment and questions were fielded by PAT clinical staff. Patient was advised that if any questions or concerns arise prior to his procedure then he should return a call to PAT and/or his surgeon's office to discuss.  Honor Loh, MSN, APRN, FNP-C, CEN [facility]  Peri-operative Services Nurse Practitioner Phone: [phone] Fax: [phone] 08/10/21 3:05 PM  NOTE: This note has been prepared using Dragon dictation software. Despite my best ability to proofread, there is always the potential that unintentional transcriptional errors may still occur from this process.

## 2021-08-10 ENCOUNTER — Other Ambulatory Visit: Payer: Self-pay

## 2021-08-10 ENCOUNTER — Other Ambulatory Visit
Admission: RE | Admit: 2021-08-10 | Discharge: 2021-08-10 | Disposition: A | Discharge status: Home / Self Care | Payer: Medicare Other | Source: Ambulatory Visit | Attending: Pulmonary Disease | Admitting: Pulmonary Disease

## 2021-08-10 ENCOUNTER — Telehealth: Payer: Self-pay

## 2021-08-10 ENCOUNTER — Encounter: Payer: Self-pay | Admitting: Urgent Care

## 2021-08-10 DIAGNOSIS — Z01812 Encounter for preprocedural laboratory examination: Secondary | ICD-10-CM | POA: Insufficient documentation

## 2021-08-10 DIAGNOSIS — Z20822 Contact with and (suspected) exposure to covid-19: Secondary | ICD-10-CM | POA: Diagnosis not present

## 2021-08-10 LAB — BASIC METABOLIC PANEL
Anion gap: 7 (ref 5–15)
BUN: 29 mg/dL — ABNORMAL HIGH (ref 8–23)
CO2: 27 mmol/L (ref 22–32)
Calcium: 9.1 mg/dL (ref 8.9–10.3)
Chloride: 103 mmol/L (ref 98–111)
Creatinine, Ser: 1.43 mg/dL — ABNORMAL HIGH (ref 0.61–1.24)
GFR, Estimated: 52 mL/min — ABNORMAL LOW (ref >60)
Glucose, Bld: 78 mg/dL (ref 70–99)
Potassium: 4.8 mmol/L (ref 3.5–5.1)
Sodium: 137 mmol/L (ref 135–145)

## 2021-08-10 LAB — CBC
HCT: 29 % — ABNORMAL LOW (ref 39.0–52.0)
Hemoglobin: 8.8 g/dL — ABNORMAL LOW (ref 13.0–17.0)
MCH: 29 pg (ref 26.0–34.0)
MCHC: 30.3 g/dL (ref 30.0–36.0)
MCV: 95.7 fL (ref 80.0–100.0)
Platelets: 155 10*3/uL (ref 150–400)
RBC: 3.03 MIL/uL — ABNORMAL LOW (ref 4.22–5.81)
RDW: 15.8 % — ABNORMAL HIGH (ref 11.5–15.5)
WBC: 4.4 10*3/uL (ref 4.0–10.5)
nRBC: 0 % (ref 0.0–0.2)

## 2021-08-10 LAB — SARS CORONAVIRUS 2 (TAT 6-24 HRS): SARS Coronavirus 2: NEGATIVE

## 2021-08-10 MED ORDER — TAMSULOSIN HCL 0.4 MG PO CAPS: 0.4000 mg | ORAL_CAPSULE | Freq: Every day | ORAL | 0 refills | Status: DC

## 2021-08-10 NOTE — Telephone Encounter (Signed)
[facility] faxed refill request for the following medications:  tamsulosin (FLOMAX) 0.4 MG CAPS capsule   Please advise.

## 2021-08-10 NOTE — Telephone Encounter (Signed)
Re-faxed to pre op per pre op provider.

## 2021-08-10 NOTE — Telephone Encounter (Signed)
° °  Primary Cardiologist: Kate Sable, MD  Chart reviewed as part of pre-operative protocol coverage. Given past medical history and time since last visit, based on ACC/AHA guidelines, Richard Wiggins would be at acceptable risk for the planned procedure without further cardiovascular testing.   Patient with diagnosis of A Fib on Eliquis for anticoagulation.     Procedure: ENB/EBUS   Date of procedure: 08/12/21     CHA2DS2-VASc Score = 6  This indicates a 9.7% annual risk of stroke. The patient's score is based upon: CHF History: 1 HTN History: 1 Diabetes History: 0 Stroke History: 2 Vascular Disease History: 1 Age Score: 1 Gender Score: 0     CrCl 45 mL/min Platelet count 153K     Patient previously cleared to hold Eliquis for 2 days for dental procedure.  Ok to hold 2 days for current procedure. If surgeon requires longer than a 2 day hold, will require authorization from cardiologist  I will route this recommendation to the requesting party via Epic fax function and remove from pre-op pool.  Please call with questions.  Jossie Ng. Zayaan Kozak NP-C    08/10/2021, 1:06 PM [place] Office [phone] Fax [phone]

## 2021-08-11 ENCOUNTER — Encounter (INDEPENDENT_AMBULATORY_CARE_PROVIDER_SITE_OTHER): Payer: Self-pay

## 2021-08-11 ENCOUNTER — Ambulatory Visit (INDEPENDENT_AMBULATORY_CARE_PROVIDER_SITE_OTHER): Payer: Medicare Other

## 2021-08-11 ENCOUNTER — Ambulatory Visit (INDEPENDENT_AMBULATORY_CARE_PROVIDER_SITE_OTHER): Payer: Medicare Other | Admitting: Vascular Surgery

## 2021-08-11 ENCOUNTER — Encounter (INDEPENDENT_AMBULATORY_CARE_PROVIDER_SITE_OTHER): Payer: Self-pay | Admitting: Vascular Surgery

## 2021-08-11 ENCOUNTER — Encounter (INDEPENDENT_AMBULATORY_CARE_PROVIDER_SITE_OTHER): Payer: Medicare Other

## 2021-08-11 VITALS — BP 142/84 | HR 61 | Resp 16 | Wt 193.0 lb

## 2021-08-11 DIAGNOSIS — I7143 Infrarenal abdominal aortic aneurysm, without rupture: Secondary | ICD-10-CM

## 2021-08-11 DIAGNOSIS — E785 Hyperlipidemia, unspecified: Secondary | ICD-10-CM

## 2021-08-11 DIAGNOSIS — R7303 Prediabetes: Secondary | ICD-10-CM | POA: Diagnosis not present

## 2021-08-11 DIAGNOSIS — I701 Atherosclerosis of renal artery: Secondary | ICD-10-CM | POA: Diagnosis not present

## 2021-08-11 DIAGNOSIS — I10 Essential (primary) hypertension: Secondary | ICD-10-CM

## 2021-08-11 DIAGNOSIS — I70213 Atherosclerosis of native arteries of extremities with intermittent claudication, bilateral legs: Secondary | ICD-10-CM | POA: Diagnosis not present

## 2021-08-11 NOTE — Assessment & Plan Note (Signed)
Planning concomitant repair with his aneurysm repair and iliac artery stenosis treatment but this is on hold for now due to his biopsy for his lung mass.

## 2021-08-11 NOTE — Assessment & Plan Note (Signed)
This measured about 4.2 cm several months ago.  It is scheduled to be checked again in late March.  We discussed concomitant repair of his aneurysm with his iliac artery stenosis treated at the same time, but now with this new lung mass and biopsy coming up in the near future for this, this will take precedence we will delay intervention at this time.

## 2021-08-11 NOTE — Assessment & Plan Note (Signed)
Symptoms have gotten significantly worse.  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  This is to be checked in the next couple of months.

## 2021-08-11 NOTE — Progress Notes (Signed)
MRN : [id number]  Richard Wiggins is a 71 y.o. ([birth year]) male who presents with chief complaint of  Chief Complaint  Patient presents with   Follow-up    1 yr no studies  .  History of Present Illness: Patient returns today in follow up of multiple vascular issues.  He continues to have worsening claudication symptoms of both lower extremities.  These have become quite disabling.  No clear aneurysm related symptoms of back pain or abdominal pain.  No clear signs of peripheral embolization.  He does have some renal dysfunction and high blood pressure and has a known renal artery stenosis seen on the review of CT angiogram as well.  He is here today to discuss repair of his aneurysm concomitant to iliac artery intervention and potentially renal artery intervention as well.  However, he has a biopsy for a lung mass scheduled for tomorrow which now takes precedence on his issues.  He has been treated previously with radiation for lung mass last year.  Current Outpatient Medications  Medication Sig Dispense Refill   albuterol (PROVENTIL) (2.5 MG/3ML) 0.083% nebulizer solution Take 3 mLs (2.5 mg total) by nebulization every 6 (six) hours as needed for wheezing or shortness of breath. 75 mL 6   albuterol (VENTOLIN HFA) 108 (90 Base) MCG/ACT inhaler Inhale 2 puffs into the lungs every 6 (six) hours as needed for wheezing or shortness of breath. 8 g 2   aspirin EC 81 MG tablet Take 81 mg by mouth daily. Swallow whole.     atorvastatin (LIPITOR) 40 MG tablet TAKE 1 TABLET(40 MG) BY MOUTH AT BEDTIME 90 tablet 3   Budeson-Glycopyrrol-Formoterol (BREZTRI AEROSPHERE) 160-9-4.8 MCG/ACT AERO Inhale 2 puffs into the lungs in the morning and at bedtime. 10.7 g 11   carvedilol (COREG) 25 MG tablet Take 0.5 tablets (12.5 mg total) by mouth in the morning and at bedtime. 90 tablet 0   ELIQUIS 5 MG TABS tablet TAKE 1 TABLET(5 MG) BY MOUTH IN THE MORNING AND AT BEDTIME 90 tablet 2   lamoTRIgine (LAMICTAL) 100  MG tablet Take 100 mg by mouth 2 (two) times daily.     Lidocaine HCl-Benzyl Alcohol (SALONPAS LIDOCAINE PLUS EX) Place 1 patch onto the skin daily as needed (pain.).     Multiple Vitamin (MULTI-VITAMIN) tablet Take 1 tablet by mouth daily.     sacubitril-valsartan (ENTRESTO) 49-51 MG Take 0.5 tablets by mouth in the morning and at bedtime.     spironolactone (ALDACTONE) 25 MG tablet Take 0.5 tablets (12.5 mg total) by mouth at bedtime. 45 tablet 0   tamsulosin (FLOMAX) 0.4 MG CAPS capsule Take 1 capsule (0.4 mg total) by mouth daily. 90 capsule 0   furosemide (LASIX) 40 MG tablet Take 1 tablet (40 mg total) by mouth as needed. 90 tablet 2   No current facility-administered medications for this visit.    Past Medical History:  Diagnosis Date   AAA (abdominal aortic aneurysm) 2020   a. 2020 U/S: 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 -plan medical managmment with tight bp control; b. 09/2020 Abd U/S: 4.1cm.   Anxiety    Aortic atherosclerosis (HCC)    Benign essential HTN 02/10/2020   BPH (benign prostatic hyperplasia) 02/09/2020   Brain aneurysm    Cancer of upper lobe of left lung ([place]) 04/2020   a.) ENB/EBUS performed; Bx non-diagnostic. b.) presumed stage I NSCLC in the LUL; underwent SBRT (60 Gy over 5 fractions)   Carotid arterial disease ([place])  a. 05/2017 s/p R carotid endarterectomy following CVA.   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 2018 Echo: EF 30-35%; b. 02/2021 Echo: EF 55-60%, nl RV fxn. Triv MR. Asc Ao 4mm.   CKD (chronic kidney disease), stage III (HCC)    Claudication ([place])    a. 09/2020 ABIs: R 0.91. L 0.99.   COPD (chronic obstructive pulmonary disease) (HCC)    Coronary artery disease    a. 06/2017 PCI ([place]): s/p PCI to the RCA. LAD 50, LCX 20.   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 02/2021 Asc Ao 69mm.   Emphysema of lung (HCC)    HLD (hyperlipidemia)    IDA (iron deficiency anemia) 02/09/2020    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   Ischemic cardiomyopathy    Long term current use of anticoagulant    a.) apixaban   Marijuana use, continuous 02/10/2020   Osteoporosis    PAF (paroxysmal atrial fibrillation) ([place])    a. 05/2017 Dx in setting of CVA. CHA2DS2VASc = 6-->Eliquis.   Pre-diabetes    Pulmonary emboli ([place]) 06/07/2017   Right lower lobe pulmonary embolism small segmental, multifocal multifocal pneumonia, mediastinal lymphadenopathy, moderate centrilobular emphysema   Seizures ([place])    Sigmoid diverticulosis    a.) CT 01/27/2021: severe   Stroke [facility])    a. 05/2017 - hospitalized in [place]->prolonged hospitalization in setting of R CEA, PE, and finding of RCA dzs on cath; b. Residual left sided weakness to arm and leg.    Past Surgical History:  Procedure Laterality Date   brain aneurysm with clip     COLONOSCOPY WITH PROPOFOL N/A 07/23/2020   Procedure: COLONOSCOPY WITH PROPOFOL;  Surgeon: Lin Landsman, MD;  Location: [facility] ENDOSCOPY;  Service: Gastroenterology;  Laterality: N/A;   CORONARY ANGIOPLASTY WITH STENT PLACEMENT  07/08/2017   TOTAL HIP ARTHROPLASTY Right    VIDEO BRONCHOSCOPY WITH ENDOBRONCHIAL NAVIGATION N/A 04/23/2020   Procedure: VIDEO BRONCHOSCOPY WITH ENDOBRONCHIAL NAVIGATION;  Surgeon: Tyler Pita, MD;  Location: [facility] ORS;  Service: Pulmonary;  Laterality: N/A;     Social History   Tobacco Use   Smoking status: Former    Packs/day: 2.00    Years: 53.00    Pack years: 106.00    Types: Cigarettes    Quit date: 05/28/2017    Years since quitting: 4.2   Smokeless tobacco: Never   Tobacco comments:    Quit in 2018  Vaping Use   Vaping Use: Former   Quit date: 01/15/2020   Devices: cbd   Substance Use Topics   Alcohol use: Yes    Comment: socially drink cocktail   Drug use: Not Currently    Comment: last smoke x1 month ago      Family History  Problem Relation Age of Onset   Diabetes Mother    Heart disease Mother     Stroke Father    Heart disease Father    Heart attack Father    Alcohol abuse Father    Heart disease Sister    Heart attack Sister    Heart disease Maternal Grandmother    Heart attack Maternal Grandmother    Heart attack Paternal Grandmother    Diabetes Brother    Heart disease Brother    Heart attack Brother      Allergies  Allergen Reactions   Tape Rash    plastic   Wound Dressing Adhesive Itching and Rash  Gets stuck to skin, makes wound spread    REVIEW OF SYSTEMS (Negative unless checked)   Constitutional: [] Weight loss  [] Fever  [] Chills Cardiac: [] Chest pain   [] Chest pressure   [] Palpitations   [] Shortness of breath when laying flat   [] Shortness of breath at rest   [] Shortness of breath with exertion. Vascular:  [x] Pain in legs with walking   [] Pain in legs at rest   [] Pain in legs when laying flat   [x] Claudication   [] Pain in feet when walking  [] Pain in feet at rest  [] Pain in feet when laying flat   [] History of DVT   [] Phlebitis   [x] Swelling in legs   [] Varicose veins   [] Non-healing ulcers Pulmonary:   [] Uses home oxygen   [] Productive cough   [] Hemoptysis   [] Wheeze  [] COPD   [] Asthma Neurologic:  [] Dizziness  [] Blackouts   [] Seizures   [] History of stroke   [] History of TIA  [] Aphasia   [] Temporary blindness   [] Dysphagia   [] Weakness or numbness in arms   [] Weakness or numbness in legs Musculoskeletal:  [x] Arthritis   [] Joint swelling   [x] Joint pain   [x] Low back pain Hematologic:  [] Easy bruising  [] Easy bleeding   [] Hypercoagulable state   [] Anemic  [] Hepatitis Gastrointestinal:  [] Blood in stool   [] Vomiting blood  [] Gastroesophageal reflux/heartburn   [] Abdominal pain Genitourinary:  [] Chronic kidney disease   [] Difficult urination  [] Frequent urination  [] Burning with urination   [] Hematuria Skin:  [] Rashes   [] Ulcers   [] Wounds Psychological:  [] History of anxiety   []  History of major depression.  Physical Examination  BP (!) 142/84 (BP Location:  Right Arm)    Pulse 61    Resp 16    Wt 193 lb (87.5 kg)    BMI 27.69 kg/m  Gen:  WD/WN, NAD Head: [place]/AT, No temporalis wasting. Ear/Nose/Throat: Hearing grossly intact, nares w/o erythema or drainage Eyes: Conjunctiva clear. Sclera non-icteric Neck: Supple.  Trachea midline Pulmonary:  Good air movement, no use of accessory muscles.  Cardiac: RRR, no JVD Vascular:  Vessel Right Left  Radial Palpable Palpable                          PT 1+ palpable 1+ palpable  DP 1+ palpable 1+ palpable   Gastrointestinal: soft, non-tender/non-distended.  Increased aortic impulse Musculoskeletal: M/S 5/5 throughout.  No deformity or atrophy.  Trace lower extremity edema. Neurologic: Sensation grossly intact in extremities.  Symmetrical.  Speech is fluent.  Psychiatric: Judgment intact, Mood & affect appropriate for pt's clinical situation. Dermatologic: No rashes or ulcers noted.  No cellulitis or open wounds.      Labs Recent Results (from the past 2160 hour(s))  Glucose, capillary     Status: None   Collection Time: 07/21/21  1:02 PM  Result Value Ref Range   Glucose-Capillary 74 70 - 99 mg/dL    Comment: Glucose reference range applies only to samples taken after fasting for at least 8 hours.  CBC     Status: Abnormal   Collection Time: 08/10/21  8:51 AM  Result Value Ref Range   WBC 4.4 4.0 - 10.5 K/uL   RBC 3.03 (L) 4.22 - 5.81 MIL/uL   Hemoglobin 8.8 (L) 13.0 - 17.0 g/dL   HCT 29.0 (L) 39.0 - 52.0 %   MCV 95.7 80.0 - 100.0 fL   MCH 29.0 26.0 - 34.0 pg   MCHC 30.3 30.0 - 36.0 g/dL  RDW 15.8 (H) 11.5 - 15.5 %   Platelets 155 150 - 400 K/uL   nRBC 0.0 0.0 - 0.2 %    Comment: Performed at [facility], [address] [place], [place] [address]  Basic Metabolic Panel     Status: Abnormal   Collection Time: 08/10/21  8:51 AM  Result Value Ref Range   Sodium 137 135 - 145 mmol/L   Potassium 4.8 3.5 - 5.1 mmol/L   Chloride 103 98 - 111 mmol/L   CO2 27 22 - 32  mmol/L   Glucose, Bld 78 70 - 99 mg/dL    Comment: Glucose reference range applies only to samples taken after fasting for at least 8 hours.   BUN 29 (H) 8 - 23 mg/dL   Creatinine, Ser 1.43 (H) 0.61 - 1.24 mg/dL   Calcium 9.1 8.9 - 10.3 mg/dL   GFR, Estimated 52 (L) >60 mL/min    Comment: (NOTE) Calculated using the CKD-EPI Creatinine Equation (2021)    Anion gap 7 5 - 15    Comment: Performed at [facility], [address] [place], [place] [address]  SARS CORONAVIRUS 2 (TAT 6-24 HRS) Nasopharyngeal Nasopharyngeal Swab     Status: None   Collection Time: 08/10/21 10:44 AM   Specimen: Nasopharyngeal Swab  Result Value Ref Range   SARS Coronavirus 2 NEGATIVE NEGATIVE    Comment: (NOTE) SARS-CoV-2 target nucleic acids are NOT DETECTED.  The SARS-CoV-2 RNA is generally detectable in upper and lower respiratory specimens during the acute phase of infection. Negative results do not preclude SARS-CoV-2 infection, do not rule out co-infections with other pathogens, and should not be used as the sole basis for treatment or other patient management decisions. Negative results must be combined with clinical observations, patient history, and epidemiological information. The expected result is Negative.  Fact Sheet for Patients: SugarRoll.be  Fact Sheet for Healthcare Providers: https://www.woods-mathews.com/  This test is not yet approved or cleared by the [place] FDA and  has been authorized for detection and/or diagnosis of SARS-CoV-2 by FDA under an Emergency Use Authorization (EUA). This EUA will remain  in effect (meaning this test can be used) for the duration of the COVID-19 declaration under Se ction 564(b)(1) of the Act, 21 U.S.C. section 360bbb-3(b)(1), unless the authorization is terminated or revoked sooner.  Performed at [facility], [address] [place], [place] [address]     Radiology NM PET Image  Restag (PS) Skull Base To Thigh  Result Date: 07/22/2021 CLINICAL DATA:  Subsequent treatment strategy for non-small cell lung cancer diagnosed in 2021 post SB RT which was completed in November 2021. EXAM: NUCLEAR MEDICINE PET SKULL BASE TO THIGH TECHNIQUE: 10.8 mCi F-18 FDG was injected intravenously. Full-ring PET imaging was performed from the skull base to thigh after the radiotracer. CT data was obtained and used for attenuation correction and anatomic localization. Fasting blood glucose: 74 mg/dl COMPARISON:  PET-CT 02/12/2020.  Chest CT 07/08/2021 and 01/02/2021. FINDINGS: Mediastinal blood pool activity: SUV max NECK: No hypermetabolic cervical lymph nodes are identified.There are no lesions of the pharyngeal mucosal space. Activity within the lymphoid tissue of Waldeyer's ring is within physiologic limits. Incidental CT findings: Bilateral carotid atherosclerosis and a right carotid stent are again noted. There is chronic right frontotemporal encephalomalacia, incompletely visualized. CHEST: The enlarging lymph node posterior to the left pulmonary artery is hypermetabolic with an SUV max of 12.0. This node measures 3.0 x 1.2 cm on image 103/3. There  is another small hypermetabolic nodule superior to the left hilum (SUV max 5.1). Corresponding finding on the CT images is not well demonstrated, and this may reflect a lymph node or small central pulmonary nodule. No other hypermetabolic lymph nodes or suspicious pulmonary nodules. There are radiation changes posteriorly in the left upper lobe at the site of the radiated primary malignancy with mild residual hypermetabolic activity (SUV max 2.4). Incidental CT findings: Diffuse atherosclerosis of the aorta, great vessels and coronary arteries. Moderate centrilobular emphysema. No significant pleural or pericardial effusion. ABDOMEN/PELVIS: There is no hypermetabolic activity within the liver, adrenal glands, spleen or pancreas. There is no hypermetabolic  nodal activity. There is mild hypermetabolic activity associated with diffuse sigmoid colon wall thickening, similar to previous study. No surrounding inflammatory changes. Incidental CT findings: Diffuse aortic and branch vessel atherosclerosis again demonstrated with aneurysmal dilatation of the infrarenal aorta. As measured in a similar fashion to the previous PET-CT, this currently measures up to 4.3 cm on the coronal images. This has been evaluated by CTA 01/27/2021, and does not appear changed from that study as measured in a similar fashion. As above, diffuse sigmoid colon diverticulosis. SKELETON: There is no hypermetabolic activity to suggest osseous metastatic disease. Incidental CT findings: Previous right total hip arthroplasty. Multilevel lumbar spondylosis. IMPRESSION: 1. The enlarging posterior left hilar lymph node is hypermetabolic, consistent with local recurrence of lung cancer. In addition, there is additional hypermetabolic activity superior to the left hilum which may reflect an intrapulmonary nodal or pulmonary metastasis. 2. No other evidence of metastatic disease. 3. Treated primary lesion posteriorly in the left upper lobe with surrounding radiation changes, but no significant hypermetabolic activity. 4. 4.3 cm abdominal aortic aneurysm, not significantly changed from CTA 01/27/2021. See follow up recommendations from that examination. 5. Aortic Atherosclerosis (ICD10-I70.0) and Emphysema (ICD10-J43.9). Electronically Signed   By: Richardean Sale M.D.   On: 07/22/2021 09:28    Assessment/Plan Benign essential HTN blood pressure control important in reducing the progression of atherosclerotic disease and aneurysmal growth. On appropriate oral medications.     Pre-diabetes blood glucose control important in reducing the progression of atherosclerotic disease. Also, involved in wound healing. On appropriate medications.     HLD (hyperlipidemia) lipid control important in reducing  the progression of atherosclerotic disease. Continue statin therapy  Abdominal aortic aneurysm (AAA) without rupture ([place]) This measured about 4.2 cm several months ago.  It is scheduled to be checked again in late March.  We discussed concomitant repair of his aneurysm with his iliac artery stenosis treated at the same time, but now with this new lung mass and biopsy coming up in the near future for this, this will take precedence we will delay intervention at this time.  Atherosclerosis of native arteries of extremity with intermittent claudication (HCC) Symptoms have gotten significantly worse.  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  This is to be checked in the next couple of months.  Renal artery stenosis [facility]) Planning concomitant repair with his aneurysm repair and iliac artery stenosis treatment but this is on hold for now due to his biopsy for his lung mass.    [name], MD  08/11/2021 1:33 PM    This note was created with Dragon medical transcription system.  Any errors from dictation are purely unintentional

## 2021-08-12 ENCOUNTER — Encounter: Payer: Self-pay | Admitting: Pulmonary Disease

## 2021-08-12 ENCOUNTER — Encounter
Admission: RE | Disposition: A | Discharge status: Home / Self Care | Payer: Self-pay | Source: Home / Self Care | Attending: Pulmonary Disease

## 2021-08-12 ENCOUNTER — Ambulatory Visit
Admission: RE | Admit: 2021-08-12 | Discharge: 2021-08-12 | Disposition: A | Discharge status: Home / Self Care | Payer: Medicare Other | Attending: Pulmonary Disease | Admitting: Pulmonary Disease

## 2021-08-12 ENCOUNTER — Ambulatory Visit: Payer: Medicare Other | Admitting: Urgent Care

## 2021-08-12 ENCOUNTER — Ambulatory Visit: Payer: Medicare Other

## 2021-08-12 DIAGNOSIS — J984 Other disorders of lung: Secondary | ICD-10-CM | POA: Diagnosis not present

## 2021-08-12 DIAGNOSIS — I509 Heart failure, unspecified: Secondary | ICD-10-CM | POA: Insufficient documentation

## 2021-08-12 DIAGNOSIS — I739 Peripheral vascular disease, unspecified: Secondary | ICD-10-CM | POA: Insufficient documentation

## 2021-08-12 DIAGNOSIS — I2510 Atherosclerotic heart disease of native coronary artery without angina pectoris: Secondary | ICD-10-CM | POA: Diagnosis not present

## 2021-08-12 DIAGNOSIS — Z87891 Personal history of nicotine dependence: Secondary | ICD-10-CM | POA: Diagnosis not present

## 2021-08-12 DIAGNOSIS — I480 Paroxysmal atrial fibrillation: Secondary | ICD-10-CM | POA: Insufficient documentation

## 2021-08-12 DIAGNOSIS — N400 Enlarged prostate without lower urinary tract symptoms: Secondary | ICD-10-CM | POA: Insufficient documentation

## 2021-08-12 DIAGNOSIS — Z86711 Personal history of pulmonary embolism: Secondary | ICD-10-CM | POA: Insufficient documentation

## 2021-08-12 DIAGNOSIS — Z8673 Personal history of transient ischemic attack (TIA), and cerebral infarction without residual deficits: Secondary | ICD-10-CM | POA: Diagnosis not present

## 2021-08-12 DIAGNOSIS — J449 Chronic obstructive pulmonary disease, unspecified: Secondary | ICD-10-CM | POA: Insufficient documentation

## 2021-08-12 DIAGNOSIS — I130 Hypertensive heart and chronic kidney disease with heart failure and stage 1 through stage 4 chronic kidney disease, or unspecified chronic kidney disease: Secondary | ICD-10-CM | POA: Diagnosis not present

## 2021-08-12 DIAGNOSIS — Z9889 Other specified postprocedural states: Secondary | ICD-10-CM

## 2021-08-12 DIAGNOSIS — E785 Hyperlipidemia, unspecified: Secondary | ICD-10-CM | POA: Diagnosis not present

## 2021-08-12 DIAGNOSIS — Z7901 Long term (current) use of anticoagulants: Secondary | ICD-10-CM | POA: Diagnosis not present

## 2021-08-12 DIAGNOSIS — I714 Abdominal aortic aneurysm, without rupture, unspecified: Secondary | ICD-10-CM | POA: Insufficient documentation

## 2021-08-12 DIAGNOSIS — I255 Ischemic cardiomyopathy: Secondary | ICD-10-CM | POA: Diagnosis not present

## 2021-08-12 DIAGNOSIS — I671 Cerebral aneurysm, nonruptured: Secondary | ICD-10-CM | POA: Diagnosis not present

## 2021-08-12 DIAGNOSIS — R569 Unspecified convulsions: Secondary | ICD-10-CM | POA: Diagnosis not present

## 2021-08-12 DIAGNOSIS — C3412 Malignant neoplasm of upper lobe, left bronchus or lung: Secondary | ICD-10-CM

## 2021-08-12 DIAGNOSIS — C342 Malignant neoplasm of middle lobe, bronchus or lung: Secondary | ICD-10-CM | POA: Diagnosis not present

## 2021-08-12 DIAGNOSIS — R918 Other nonspecific abnormal finding of lung field: Secondary | ICD-10-CM | POA: Diagnosis not present

## 2021-08-12 DIAGNOSIS — R590 Localized enlarged lymph nodes: Secondary | ICD-10-CM | POA: Insufficient documentation

## 2021-08-12 DIAGNOSIS — N183 Chronic kidney disease, stage 3 unspecified: Secondary | ICD-10-CM | POA: Diagnosis not present

## 2021-08-12 DIAGNOSIS — I700 Atherosclerosis of aorta: Secondary | ICD-10-CM | POA: Insufficient documentation

## 2021-08-12 HISTORY — DX: Hyperlipidemia, unspecified: E78.5

## 2021-08-12 HISTORY — DX: Long term (current) use of anticoagulants: Z79.01

## 2021-08-12 HISTORY — DX: Ischemic cardiomyopathy: I25.5

## 2021-08-12 HISTORY — DX: Diverticulosis of large intestine without perforation or abscess without bleeding: K57.30

## 2021-08-12 HISTORY — DX: Atherosclerosis of aorta: I70.0

## 2021-08-12 HISTORY — PX: VIDEO BRONCHOSCOPY WITH ENDOBRONCHIAL ULTRASOUND: SHX6177

## 2021-08-12 SURGERY — BRONCHOSCOPY, WITH EBUS
Anesthesia: General | Laterality: Left

## 2021-08-12 MED ORDER — ROCURONIUM BROMIDE 10 MG/ML (PF) SYRINGE
PREFILLED_SYRINGE | INTRAVENOUS | Status: AC
  Filled 2021-08-12: qty 10

## 2021-08-12 MED ORDER — FAMOTIDINE 20 MG PO TABS: 20.0000 mg | ORAL_TABLET | Freq: Once | ORAL | Status: AC

## 2021-08-12 MED ORDER — FENTANYL CITRATE (PF) 100 MCG/2ML IJ SOLN
INTRAMUSCULAR | Status: DC | PRN
Start: 2021-08-12 — End: 2021-08-12
  Administered 2021-08-12 (×2): 50 ug via INTRAVENOUS

## 2021-08-12 MED ORDER — DEXAMETHASONE SODIUM PHOSPHATE 10 MG/ML IJ SOLN
INTRAMUSCULAR | Status: DC | PRN
Start: 2021-08-12 — End: 2021-08-12
  Administered 2021-08-12: 10 mg via INTRAVENOUS

## 2021-08-12 MED ORDER — ROCURONIUM BROMIDE 100 MG/10ML IV SOLN
INTRAVENOUS | Status: DC | PRN
  Administered 2021-08-12: 50 mg via INTRAVENOUS

## 2021-08-12 MED ORDER — CHLORHEXIDINE GLUCONATE 0.12 % MT SOLN
OROMUCOSAL | Status: AC
  Administered 2021-08-12: 15 mL via OROMUCOSAL
  Filled 2021-08-12: qty 15

## 2021-08-12 MED ORDER — LIDOCAINE HCL (PF) 2 % IJ SOLN
INTRAMUSCULAR | Status: AC
  Filled 2021-08-12: qty 5

## 2021-08-12 MED ORDER — PROPOFOL 500 MG/50ML IV EMUL
INTRAVENOUS | Status: DC | PRN
Start: 2021-08-12 — End: 2021-08-12
  Administered 2021-08-12: 120 ug/kg/min via INTRAVENOUS

## 2021-08-12 MED ORDER — PROPOFOL 500 MG/50ML IV EMUL
INTRAVENOUS | Status: AC
  Filled 2021-08-12: qty 50

## 2021-08-12 MED ORDER — FENTANYL CITRATE (PF) 100 MCG/2ML IJ SOLN
INTRAMUSCULAR | Status: AC
  Filled 2021-08-12: qty 2

## 2021-08-12 MED ORDER — FAMOTIDINE 20 MG PO TABS
ORAL_TABLET | ORAL | Status: AC
  Administered 2021-08-12: 20 mg via ORAL
  Filled 2021-08-12: qty 1

## 2021-08-12 MED ORDER — ORAL CARE MOUTH RINSE: 15.0000 mL | Freq: Once | OROMUCOSAL | Status: AC

## 2021-08-12 MED ORDER — DEXAMETHASONE SODIUM PHOSPHATE 10 MG/ML IJ SOLN
INTRAMUSCULAR | Status: AC
  Filled 2021-08-12: qty 1

## 2021-08-12 MED ORDER — SUGAMMADEX SODIUM 200 MG/2ML IV SOLN
INTRAVENOUS | Status: DC | PRN
  Administered 2021-08-12: 200 mg via INTRAVENOUS

## 2021-08-12 MED ORDER — PROPOFOL 10 MG/ML IV BOLUS
INTRAVENOUS | Status: DC | PRN
  Administered 2021-08-12: 100 mg via INTRAVENOUS

## 2021-08-12 MED ORDER — GLYCOPYRROLATE 0.2 MG/ML IJ SOLN
INTRAMUSCULAR | Status: DC | PRN
  Administered 2021-08-12: .2 mg via INTRAVENOUS

## 2021-08-12 MED ORDER — LIDOCAINE HCL (CARDIAC) PF 100 MG/5ML IV SOSY
PREFILLED_SYRINGE | INTRAVENOUS | Status: DC | PRN
  Administered 2021-08-12: 50 mg via INTRAVENOUS

## 2021-08-12 MED ORDER — ONDANSETRON HCL 4 MG/2ML IJ SOLN
INTRAMUSCULAR | Status: AC
  Filled 2021-08-12: qty 2

## 2021-08-12 MED ORDER — LACTATED RINGERS IV SOLN: INTRAVENOUS | Status: DC

## 2021-08-12 MED ORDER — CHLORHEXIDINE GLUCONATE 0.12 % MT SOLN: 15.0000 mL | Freq: Once | OROMUCOSAL | Status: AC

## 2021-08-12 MED ORDER — ONDANSETRON HCL 4 MG/2ML IJ SOLN
INTRAMUSCULAR | Status: DC | PRN
Start: 2021-08-12 — End: 2021-08-12
  Administered 2021-08-12: 4 mg via INTRAVENOUS

## 2021-08-12 MED ORDER — EPHEDRINE 5 MG/ML INJ
INTRAVENOUS | Status: AC
  Filled 2021-08-12: qty 5

## 2021-08-12 MED ORDER — EPHEDRINE SULFATE (PRESSORS) 50 MG/ML IJ SOLN
INTRAMUSCULAR | Status: DC | PRN
  Administered 2021-08-12: 5 mg via INTRAVENOUS

## 2021-08-12 MED ORDER — PROPOFOL 10 MG/ML IV BOLUS
INTRAVENOUS | Status: AC
  Filled 2021-08-12: qty 20

## 2021-08-12 MED ORDER — GLYCOPYRROLATE 0.2 MG/ML IJ SOLN
INTRAMUSCULAR | Status: AC
  Filled 2021-08-12: qty 1

## 2021-08-12 MED ORDER — SODIUM CHLORIDE 0.9 % IV SOLN
Freq: Once | INTRAVENOUS | Status: DC
Start: 2021-08-12 — End: 2021-08-12

## 2021-08-12 NOTE — Discharge Instructions (Addendum)
You may resume Eliquis tomorrow August 13, 2021.  AMBULATORY SURGERY  DISCHARGE INSTRUCTIONS   The drugs that you were given will stay in your system until tomorrow so for the next 24 hours you should not:  Drive an automobile Make any legal decisions Drink any alcoholic beverage   You may resume regular meals tomorrow.  Today it is better to start with liquids and gradually work up to solid foods.  You may eat anything you prefer, but it is better to start with liquids, then soup and crackers, and gradually work up to solid foods.   Please notify your doctor immediately if you have any unusual bleeding, trouble breathing, redness and pain at the surgery site, drainage, fever, or pain not relieved by medication.    Additional Instructions:        Please contact your physician with any problems or Same Day Surgery at [phone], Monday through Friday 6 am to 4 pm, or [place] at [facility] number at [phone].

## 2021-08-12 NOTE — Interval H&P Note (Signed)
History and Physical Interval Note:  08/12/2021 11:45 AM  Richard Wiggins  has presented today for surgery, with the diagnosis of LUNG NODULE LEFT.  The various methods of treatment have been discussed with the patient and family. After consideration of risks, benefits and other options for treatment, the patient has consented to  Procedure(s): [place] (Left) as a surgical intervention.  The patient's history has been reviewed, patient examined, no change in status, stable for surgery.  I have reviewed the patient's chart and labs.  Questions were answered to the patient's satisfaction.     [name]

## 2021-08-12 NOTE — Anesthesia Procedure Notes (Signed)
Procedure Name: Intubation Date/Time: 08/12/2021 1:20 PM Performed by: Rolla Plate, CRNA Pre-anesthesia Checklist: Patient identified, Patient being monitored, Timeout performed, Emergency Drugs available and Suction available Patient Re-evaluated:Patient Re-evaluated prior to induction Oxygen Delivery Method: Circle system utilized Preoxygenation: Pre-oxygenation with 100% oxygen Induction Type: IV induction Ventilation: Mask ventilation without difficulty Laryngoscope Size: McGraph and 4 Grade View: Grade I Tube type: Oral Tube size: 8.5 mm Number of attempts: 1 Airway Equipment and Method: Stylet and Video-laryngoscopy Placement Confirmation: ETT inserted through vocal cords under direct vision, positive ETCO2 and breath sounds checked- equal and bilateral Secured at: 24 cm Tube secured with: Tape Dental Injury: Teeth and Oropharynx as per pre-operative assessment

## 2021-08-12 NOTE — Transfer of Care (Signed)
Immediate Anesthesia Transfer of Care Note  Patient: Richard Wiggins  Procedure(s) Performed: VIDEO BRONCHOSCOPY WITH ENDOBRONCHIAL ULTRASOUND (Left)  Patient Location: PACU  Anesthesia Type:General  Level of Consciousness: drowsy  Airway & Oxygen Therapy: Patient Spontanous Breathing and Patient connected to face mask oxygen  Post-op Assessment: Report given to RN and Post -op Vital signs reviewed and stable  Post vital signs: Reviewed  Last Vitals:  Vitals Value Taken Time  BP    Temp    Pulse    Resp    SpO2      Last Pain:  Vitals:   08/12/21 1114  TempSrc: Temporal  PainSc: 0-No pain         Complications: No notable events documented.

## 2021-08-12 NOTE — Op Note (Signed)
PROCEDURE:  BRONCHOSCOPY ENDOBRONCHIAL ULTRASOUND with TBNA    PROCEDURE DATE: 08/12/2021  TIME:  NAME:  Richard Wiggins  DOB:11-29-1950  MRN: [id number] LOC:  ARPO/None    HOSP DAY: N/A    Indications/Preliminary Diagnosis:Adenopathy  Consent: (Place X beside choice/s below)  The benefits, risks and possible complications of the procedure were        explained to:  _X__ patient  _X__ patient's family  ___ other:___________  who verbalized understanding and gave:  ___ verbal  ___ written  _X__ verbal and written  ___ telephone  ___ other:________ consent.      Unable to obtain consent; procedure performed on emergent basis.     Other:    Benefits, limitations and potential complications of the procedure were discussed with the patient/family.  Complications from bronchoscopy are rare and most often minor, but if they occur they may include breathing difficulty, vocal cord spasm, hoarseness, slight fever, vomiting, dizziness, bronchospasm, infection, low blood oxygen, bleeding from biopsy site, or an allergic reaction to medications.  It is uncommon for patients to experience other more serious complications for example: Collapsed lung requiring chest tube placement, respiratory failure, heart attack and/or cardiac arrhythmia.  Patient agreed to proceed.   Surgeon: Renold Don, MD Assistant/Scrub: Liborio Nixon, RRT Circulator: Annia Belt, RRT Anesthesiologist/CRNA: Vashti Hey, MD/Benjamin Rolla Plate, CRNA Cytotechnology: Maryan Puls, team lead Veryl Speak  Description of Procedure: The patient was taken to Procedure Room 2 (Bronchoscopy Suite) appropriate timeout was taken with the staff.  Patient was  inducted under general anesthesia by the anesthesia team.  He was intubated with an 8.5 ETT without difficulty.  A Portex adapter was placed on the ETT flange.  At this point the Olympus video bronchoscope was advanced through the Portex adapter and anatomic tour of the airway was  performed.   The visible trachea was normal, carina was sharp, inspection of the right lung showed no abnormalities or endobronchial lesions on the right upper lobe, right middle lobe and right lower lobe subsegments.  Inspection of the left lung showed no endobronchial lesions,on left upper lobe, lingula and lower lobe subsegments.there were some retained secretions on the left upper lobe and lingula, these appear benign, these were lavaged until clear.  Once this survey was completed the airway was examined for hemostasis and the bronchoscope was retrieved and exchanged for Olympus endobronchial ultrasound (EBUS) scope.  At this point the mediastinum was examined there was a 3 cm x 2 cm lymph node on the left hilar station 10 L. No other adenopathy was noted upon inspection of the remainder of the mediastinum.  Approach to this 10 L lymph node was difficult due to very close proximity to the pulmonary artery.  A 22-gauge Cook EBUS needle was then used to sample this lymph node.  However the needle would not penetrate fully into the lymph node.  This was then switched to a Cook 25-gauge EBUS needle.  2 transbronchial needle aspirates were performed with 1 placed in CytoLyt and the second 1 submitted for ROSE.  ROSE identified lesional cells in the lymph node.  The 25-gauge Cook EBUS needle had to be then replaced due to being bent after the second pass.  At this point a 21-gauge Olympus EBUS needle was utilized.  This needle however could not penetrate into the lymph node.  An additional 4 more passes were performed with a fresh 25-gauge Cook EBUS needle.  All material was placed in CytoLyt as preservative.  Having completed this portion  of the procedure there was minimal heme noted on the left mainstem bronchus, this was lavaged until clear.  Patient then received 9 mL of 1% lidocaine via bronchial lavage and the bronchoscope was retrieved.  The procedure was at this point terminated.  Patient was allowed to  emerge from general anesthesia and was transferred to the PACU in satisfactory condition.  No overt complications noted.  Postprocedure chest x-ray without pneumothorax.  Patient without chest pain after the procedure.  Breath sounds symmetrical.  Patient tolerated the procedure very well.  SPECIMENS (Sites): (Place X beside choice below)  Specimens Description   No Specimens Obtained     Washings    Lavage    Biopsies   X Fine Needle Aspirates X 6 station 10 L   Brushings    Sputum    FINDINGS:  3 cm x 2 cm lymph node station 10 L  Intraoperative image arrow points to lymph node:   Arrow points to EBUS needle within lymph node:     ESTIMATED BLOOD LOSS: Nil COMPLICATIONS/RESOLUTION: None Postprocedure chest x-ray shows no pneumothorax:     IMPRESSION:POST-PROCEDURE DX:  LEFT hilar adenopathy (station 10 L) with preliminary finding of lesional cells Status post successful sampling with EBUS TBNA  RECOMMENDATION/PLAN:  Follow-up on pathology report Patient has follow-up appointments set up with oncology and pulmonary    C. Derrill Kay, MD Advanced Bronchoscopy [facility]    *This note was dictated using voice recognition software/Dragon.  Despite best efforts to proofread, errors can occur which can change the meaning. Any transcriptional errors that result from this process are unintentional and may not be fully corrected at the time of dictation.

## 2021-08-12 NOTE — Anesthesia Preprocedure Evaluation (Signed)
Anesthesia Evaluation  Patient identified by MRN, date of birth, ID band Patient awake    Reviewed: Allergy & Precautions, H&P , NPO status , Patient's Chart, lab work & pertinent test results, reviewed documented beta blocker date and time   Airway Mallampati: III  TM Distance: >3 FB Neck ROM: full    Dental  (+) Upper Dentures, Lower Dentures   Pulmonary COPD, former smoker,    Pulmonary exam normal        Cardiovascular Exercise Tolerance: Poor hypertension, On Medications + CAD, + Peripheral Vascular Disease, +CHF and + DOE  (-) Orthopnea Normal cardiovascular exam Rhythm:regular Rate:Normal     Neuro/Psych Seizures -, Well Controlled,  PSYCHIATRIC DISORDERS Anxiety Depression CVA, Residual Symptoms    GI/Hepatic negative GI ROS, Neg liver ROS,   Endo/Other  negative endocrine ROS  Renal/GU Renal disease  negative genitourinary   Musculoskeletal   Abdominal   Peds  Hematology  (+) Blood dyscrasia, anemia ,   Anesthesia Other Findings Past Medical History: 2020: AAA (abdominal aortic aneurysm)     Comment:  a. 2020 U/S: 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 -plan medical               managmment with tight bp control; b. 09/2020 Abd U/S:               4.1cm. No date: Anxiety No date: Aortic atherosclerosis ([place]) 02/10/2020: Benign essential HTN 02/09/2020: BPH (benign prostatic hyperplasia) No date: Brain aneurysm 04/2020: Cancer of upper lobe of left lung (HCC)     Comment:  a.) ENB/EBUS performed; Bx non-diagnostic. b.) presumed               stage I NSCLC in the LUL; underwent SBRT (60 Gy over 5               fractions) No date: Carotid arterial disease ([place])     Comment:  a. 05/2017 s/p R carotid endarterectomy following CVA. 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 2018 Echo: EF 30-35%; b. 02/2021 Echo: EF 55-60%, nl               RV fxn. Triv MR. Asc Ao  41mm. No date: CKD (chronic kidney disease), stage III ([place]) No date: Claudication [facility])     Comment:  a. 09/2020 ABIs: R 0.91. L 0.99. No date: COPD (chronic obstructive pulmonary disease) (HCC) No date: Coronary artery disease     Comment:  a. 06/2017 PCI ([place]): s/p PCI to the RCA. LAD 50, LCX 20. 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 02/2021 Asc Ao 72mm. No date: Emphysema of lung (HCC) No date: HLD (hyperlipidemia) 02/09/2020: IDA (iron deficiency anemia) 02/10/2020: Indeterminate pulmonary nodules No date: Ischemic cardiomyopathy No date: Long term current use of anticoagulant     Comment:  a.) apixaban 02/10/2020: Marijuana use, continuous No date: Osteoporosis No date: PAF (paroxysmal atrial fibrillation) ([place])     Comment:  a. 05/2017 Dx in setting of CVA. CHA2DS2VASc =               6-->Eliquis. No date: Pre-diabetes 06/07/2017: Pulmonary emboli (HCC)     Comment:  Right lower lobe pulmonary embolism small segmental,               multifocal multifocal pneumonia, mediastinal  lymphadenopathy, moderate centrilobular emphysema No date: Seizures ([place]) No date: Sigmoid diverticulosis     Comment:  a.) CT 01/27/2021: severe No date: Stroke [facility])     Comment:  a. 05/2017 - hospitalized in [place]->prolonged               hospitalization in setting of R CEA, PE, and finding of               RCA dzs on cath; b. Residual left sided weakness to arm               and leg. Past Surgical History: No date: brain aneurysm with clip 07/23/2020: COLONOSCOPY WITH PROPOFOL; N/A     Comment:  Procedure: COLONOSCOPY WITH PROPOFOL;  Surgeon: Lin Landsman, MD;  Location: [facility];  Service:               Gastroenterology;  Laterality: N/A; 07/08/2017: CORONARY ANGIOPLASTY WITH STENT PLACEMENT No date: TOTAL HIP ARTHROPLASTY; Right 04/23/2020: VIDEO BRONCHOSCOPY WITH ENDOBRONCHIAL NAVIGATION; N/A      Comment:  Procedure: VIDEO BRONCHOSCOPY WITH ENDOBRONCHIAL               NAVIGATION;  Surgeon: Tyler Pita, MD;  Location:               [facility] ORS;  Service: Pulmonary;  Laterality: N/A;   Reproductive/Obstetrics negative OB ROS                             Anesthesia Physical Anesthesia Plan  ASA: 3  Anesthesia Plan: General ETT   Post-op Pain Management:    Induction:   PONV Risk Score and Plan:   Airway Management Planned:   Additional Equipment:   Intra-op Plan:   Post-operative Plan:   Informed Consent: I have reviewed the patients History and Physical, chart, labs and discussed the procedure including the risks, benefits and alternatives for the proposed anesthesia with the patient or authorized representative who has indicated his/her understanding and acceptance.     Dental Advisory Given  Plan Discussed with: CRNA  Anesthesia Plan Comments:         Anesthesia Quick Evaluation

## 2021-08-13 ENCOUNTER — Encounter: Payer: Self-pay | Admitting: Pulmonary Disease

## 2021-08-14 LAB — CYTOLOGY - NON PAP

## 2021-08-16 NOTE — Anesthesia Postprocedure Evaluation (Signed)
Anesthesia Post Note  Patient: Weston Settle  Procedure(s) Performed: VIDEO BRONCHOSCOPY WITH ENDOBRONCHIAL ULTRASOUND (Left)  Patient location during evaluation: PACU Anesthesia Type: General Level of consciousness: awake and alert Pain management: pain level controlled Vital Signs Assessment: post-procedure vital signs reviewed and stable Respiratory status: spontaneous breathing, nonlabored ventilation, respiratory function stable and patient connected to nasal cannula oxygen Cardiovascular status: blood pressure returned to baseline and stable Postop Assessment: no apparent nausea or vomiting Anesthetic complications: no   No notable events documented.   Last Vitals:  Vitals:   08/12/21 1512 08/12/21 1528  BP: (!) 153/73   Pulse: 76 69  Resp: (!) 44 (!) 42  Temp: (!) 36.1 C   SpO2: 97% 98%    Last Pain:  Vitals:   08/13/21 0934  TempSrc:   PainSc: 0-No pain                 Molli Barrows

## 2021-08-18 ENCOUNTER — Inpatient Hospital Stay: Payer: No Typology Code available for payment source | Attending: Oncology | Admitting: Oncology

## 2021-08-18 ENCOUNTER — Encounter: Payer: Self-pay | Admitting: Oncology

## 2021-08-18 ENCOUNTER — Ambulatory Visit
Admission: RE | Admit: 2021-08-18 | Discharge: 2021-08-18 | Disposition: A | Discharge status: Home / Self Care | Payer: Medicare Other | Source: Ambulatory Visit | Attending: Radiation Oncology | Admitting: Radiation Oncology

## 2021-08-18 ENCOUNTER — Telehealth: Payer: Self-pay

## 2021-08-18 ENCOUNTER — Other Ambulatory Visit: Payer: Self-pay

## 2021-08-18 ENCOUNTER — Other Ambulatory Visit: Payer: Self-pay | Admitting: Family Medicine

## 2021-08-18 VITALS — BP 132/78 | HR 71 | Temp 97.0°F | Resp 16 | Ht 70.0 in | Wt 194.9 lb

## 2021-08-18 DIAGNOSIS — N183 Chronic kidney disease, stage 3 unspecified: Secondary | ICD-10-CM | POA: Insufficient documentation

## 2021-08-18 DIAGNOSIS — C3490 Malignant neoplasm of unspecified part of unspecified bronchus or lung: Secondary | ICD-10-CM

## 2021-08-18 DIAGNOSIS — Z5111 Encounter for antineoplastic chemotherapy: Secondary | ICD-10-CM | POA: Insufficient documentation

## 2021-08-18 DIAGNOSIS — D631 Anemia in chronic kidney disease: Secondary | ICD-10-CM | POA: Diagnosis not present

## 2021-08-18 DIAGNOSIS — I6529 Occlusion and stenosis of unspecified carotid artery: Secondary | ICD-10-CM

## 2021-08-18 DIAGNOSIS — I480 Paroxysmal atrial fibrillation: Secondary | ICD-10-CM

## 2021-08-18 DIAGNOSIS — Z7189 Other specified counseling: Secondary | ICD-10-CM

## 2021-08-18 DIAGNOSIS — C3412 Malignant neoplasm of upper lobe, left bronchus or lung: Secondary | ICD-10-CM | POA: Insufficient documentation

## 2021-08-18 DIAGNOSIS — R590 Localized enlarged lymph nodes: Secondary | ICD-10-CM | POA: Diagnosis not present

## 2021-08-18 DIAGNOSIS — Z87891 Personal history of nicotine dependence: Secondary | ICD-10-CM | POA: Diagnosis not present

## 2021-08-18 DIAGNOSIS — I639 Cerebral infarction, unspecified: Secondary | ICD-10-CM

## 2021-08-18 NOTE — Progress Notes (Signed)
Hematology/Oncology Consult note [facility]  Telephone:([phone] [phone]  Patient Care Team: Pcp, No as PCP - General Kate Sable, MD as PCP - Cardiology (Cardiology) Telford Nab, RN as Oncology Nurse Navigator Noreene Filbert, MD as Referring Physician (Radiation Oncology) Sindy Guadeloupe, MD as Consulting Physician (Oncology)   Name of the patient: Richard Wiggins  [id number]  [birth year]   Date of visit: 08/18/21  Diagnosis- stage I lung cancer s/p SBRT now with recurrence and hilar adenopathy  Chief complaint/ Reason for visit-discuss pathology results and further management  Heme/Onc history: Patient is a 71 year old male who was a former smoker and quit in 2018.  He went to the ER in July 2021 for possible strokelike symptoms and was found to have a left upper lobe lung nodule which was followed up with a CT chest which showed a 1.2 x 1.1 cm left upper lobe spiculated nodule.  This was followed by a PET scan which showed that nodule was hypermetabolic with an SUV of 4.53 patient was also incidentally noted to have a 4.3 cm abdominal aortic aneurysm.  He follows up with Dr. Lucky Cowboy for his aneurysm.  He was seen by Dr. Patsey Berthold and underwent ENB.  Biopsy was nondiagnostic.  However given that the nodule was hypermetabolic it was concerning for malignancy and recommendation per Dr. Patsey Berthold was empiric radiation. Patient underwent SBRT to his left upper lobe by Dr. Baruch Gouty   Patient also has a positive fit test.  Colonoscopy in January 2022 showed no evidence of colon cancer.  There wereCouple of polyps which were resected and were consistent with tubular adenoma  Patient noted to have enlarging left hilar lymph node which was PET positiveIn January 2023.  He had EBUS guided bronchoscopy with Dr. Patsey Berthold which was consistent with non-small cell lung cancer  Interval history-patient is here with his wife today.  Other than mild fatigue he  is otherwise doing well and denies any specific complaints at this time  ECOG PS- 1 Pain scale- 0   Review of systems- Review of Systems  Constitutional:  Positive for malaise/fatigue. Negative for chills, fever and weight loss.  HENT:  Negative for congestion, ear discharge and nosebleeds.   Eyes:  Negative for blurred vision.  Respiratory:  Negative for cough, hemoptysis, sputum production, shortness of breath and wheezing.   Cardiovascular:  Negative for chest pain, palpitations, orthopnea and claudication.  Gastrointestinal:  Negative for abdominal pain, blood in stool, constipation, diarrhea, heartburn, melena, nausea and vomiting.  Genitourinary:  Negative for dysuria, flank pain, frequency, hematuria and urgency.  Musculoskeletal:  Negative for back pain, joint pain and myalgias.  Skin:  Negative for rash.  Neurological:  Negative for dizziness, tingling, focal weakness, seizures, weakness and headaches.  Endo/Heme/Allergies:  Does not bruise/bleed easily.  Psychiatric/Behavioral:  Negative for depression and suicidal ideas. The patient does not have insomnia.       Allergies  Allergen Reactions   Tape Rash    plastic   Wound Dressing Adhesive Itching and Rash    Gets stuck to skin, makes wound spread     Past Medical History:  Diagnosis Date   AAA (abdominal aortic aneurysm) 2020   a. 2020 U/S: 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 -plan medical managmment with tight bp control; b. 09/2020 Abd U/S: 4.1cm.   Anxiety    Aortic atherosclerosis (HCC)    Benign essential HTN 02/10/2020   BPH (benign prostatic hyperplasia) 02/09/2020   Brain aneurysm  Cancer of upper lobe of left lung ([place]) 04/2020   a.) ENB/EBUS performed; Bx non-diagnostic. b.) presumed stage I NSCLC in the LUL; underwent SBRT (60 Gy over 5 fractions)   Carotid arterial disease ([place])    a. 05/2017 s/p R carotid endarterectomy following CVA.   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 2018 Echo: EF 30-35%; b. 02/2021 Echo: EF 55-60%, nl RV fxn. Triv MR. Asc Ao 30mm.   CKD (chronic kidney disease), stage III (HCC)    Claudication ([place])    a. 09/2020 ABIs: R 0.91. L 0.99.   COPD (chronic obstructive pulmonary disease) (HCC)    Coronary artery disease    a. 06/2017 PCI ([place]): s/p PCI to the RCA. LAD 50, LCX 20.   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 02/2021 Asc Ao 22mm.   Emphysema of lung (HCC)    HLD (hyperlipidemia)    IDA (iron deficiency anemia) 02/09/2020   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   Ischemic cardiomyopathy    Long term current use of anticoagulant    a.) apixaban   Marijuana use, continuous 02/10/2020   Osteoporosis    PAF (paroxysmal atrial fibrillation) ([place])    a. 05/2017 Dx in setting of CVA. CHA2DS2VASc = 6-->Eliquis.   Pre-diabetes    Pulmonary emboli ([place] 06/07/2017   Right lower lobe pulmonary embolism small segmental, multifocal multifocal pneumonia, mediastinal lymphadenopathy, moderate centrilobular emphysema   Seizures ([place])    Sigmoid diverticulosis    a.) CT 01/27/2021: severe   Stroke [facility])    a. 05/2017 - hospitalized in [place]->prolonged hospitalization in setting of R CEA, PE, and finding of RCA dzs on cath; b. Residual left sided weakness to arm and leg.     Past Surgical History:  Procedure Laterality Date   brain aneurysm with clip     COLONOSCOPY WITH PROPOFOL N/A 07/23/2020   Procedure: COLONOSCOPY WITH PROPOFOL;  Surgeon: Lin Landsman, MD;  Location: [facility] ENDOSCOPY;  Service: Gastroenterology;  Laterality: N/A;   CORONARY ANGIOPLASTY WITH STENT PLACEMENT  07/08/2017   TOTAL HIP ARTHROPLASTY Right    VIDEO BRONCHOSCOPY WITH ENDOBRONCHIAL NAVIGATION N/A 04/23/2020   Procedure: VIDEO BRONCHOSCOPY WITH ENDOBRONCHIAL NAVIGATION;  Surgeon: Tyler Pita, MD;  Location: [facility] ORS;  Service: Pulmonary;  Laterality: N/A;   VIDEO BRONCHOSCOPY WITH ENDOBRONCHIAL ULTRASOUND Left  08/12/2021   Procedure: VIDEO BRONCHOSCOPY WITH ENDOBRONCHIAL ULTRASOUND;  Surgeon: Tyler Pita, MD;  Location: [facility] ORS;  Service: Cardiopulmonary;  Laterality: Left;    Social History   Socioeconomic History   Marital status: Married    Spouse name: Butch Penny   Number of children: Not on file   Years of education: Not on file   Highest education level: High school graduate  Occupational History   Occupation: Retired  Tobacco Use   Smoking status: Former    Packs/day: 2.00    Years: 53.00    Pack years: 106.00    Types: Cigarettes    Quit date: 05/28/2017    Years since quitting: 4.2   Smokeless tobacco: Never   Tobacco comments:    Quit in 2018  Vaping Use   Vaping Use: Former   Quit date: 01/15/2020   Devices: cbd   Substance and Sexual Activity   Alcohol use: Yes    Comment: socially drink cocktail   Drug use: Not Currently    Types: Marijuana    Comment: last smoke  x1 month ago   Sexual activity: Not Currently  Other Topics Concern   Not on file  Social History Narrative   Lives with wife. 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 Children x2 children and grandchildren grown.    Social Determinants of Health   Financial Resource Strain: Not on file  Food Insecurity: Not on file  Transportation Needs: Not on file  Physical Activity: Not on file  Stress: Not on file  Social Connections: Not on file  Intimate Partner Violence: Not on file    Family History  Problem Relation Age of Onset   Diabetes Mother    Heart disease Mother    Stroke Father    Heart disease Father    Heart attack Father    Alcohol abuse Father    Heart disease Sister    Heart attack Sister    Heart disease Maternal Grandmother    Heart attack Maternal Grandmother    Heart attack Paternal Grandmother    Diabetes Brother    Heart disease Brother    Heart attack Brother      Current Outpatient Medications:    albuterol (PROVENTIL) (2.5 MG/3ML) 0.083% nebulizer solution,  Take 3 mLs (2.5 mg total) by nebulization every 6 (six) hours as needed for wheezing or shortness of breath., Disp: 75 mL, Rfl: 6   albuterol (VENTOLIN HFA) 108 (90 Base) MCG/ACT inhaler, Inhale 2 puffs into the lungs every 6 (six) hours as needed for wheezing or shortness of breath., Disp: 8 g, Rfl: 2   aspirin EC 81 MG tablet, Take 81 mg by mouth daily. Swallow whole., Disp: , Rfl:    atorvastatin (LIPITOR) 40 MG tablet, TAKE 1 TABLET(40 MG) BY MOUTH AT BEDTIME, Disp: 90 tablet, Rfl: 3   Budeson-Glycopyrrol-Formoterol (BREZTRI AEROSPHERE) 160-9-4.8 MCG/ACT AERO, Inhale 2 puffs into the lungs in the morning and at bedtime., Disp: 10.7 g, Rfl: 11   carvedilol (COREG) 25 MG tablet, Take 0.5 tablets (12.5 mg total) by mouth in the morning and at bedtime., Disp: 90 tablet, Rfl: 0   ELIQUIS 5 MG TABS tablet, TAKE 1 TABLET(5 MG) BY MOUTH IN THE MORNING AND AT BEDTIME, Disp: 90 tablet, Rfl: 2   lamoTRIgine (LAMICTAL) 100 MG tablet, Take 100 mg by mouth 2 (two) times daily., Disp: , Rfl:    Lidocaine HCl-Benzyl Alcohol (SALONPAS LIDOCAINE PLUS EX), Place 1 patch onto the skin daily as needed (pain.)., Disp: , Rfl:    Multiple Vitamin (MULTI-VITAMIN) tablet, Take 1 tablet by mouth daily., Disp: , Rfl:    sacubitril-valsartan (ENTRESTO) 49-51 MG, Take 0.5 tablets by mouth in the morning and at bedtime., Disp: , Rfl:    spironolactone (ALDACTONE) 25 MG tablet, Take 0.5 tablets (12.5 mg total) by mouth at bedtime., Disp: 45 tablet, Rfl: 0   tamsulosin (FLOMAX) 0.4 MG CAPS capsule, Take 1 capsule (0.4 mg total) by mouth daily., Disp: 90 capsule, Rfl: 0   furosemide (LASIX) 40 MG tablet, Take 1 tablet (40 mg total) by mouth as needed., Disp: 90 tablet, Rfl: 2  Physical exam:  Vitals:   08/18/21 1031  BP: 132/78  Pulse: 71  Resp: 16  Temp: (!) 97 F (36.1 C)  TempSrc: Tympanic  SpO2: 99%  Weight: 194 lb 14.4 oz (88.4 kg)  Height: 5\' 10"  (1.778 m)   Physical Exam Cardiovascular:     Rate and Rhythm:  Normal rate and regular rhythm.     Heart sounds: Normal heart sounds.  Pulmonary:  Effort: Pulmonary effort is normal.     Breath sounds: Normal breath sounds.  Skin:    General: Skin is warm and dry.  Neurological:     Mental Status: He is alert and oriented to person, place, and time.     CMP Latest Ref Rng & Units 08/10/2021  Glucose 70 - 99 mg/dL 78  BUN 8 - 23 mg/dL 29(H)  Creatinine 0.61 - 1.24 mg/dL 1.43(H)  Sodium 135 - 145 mmol/L 137  Potassium 3.5 - 5.1 mmol/L 4.8  Chloride 98 - 111 mmol/L 103  CO2 22 - 32 mmol/L 27  Calcium 8.9 - 10.3 mg/dL 9.1  Total Protein 6.5 - 8.1 g/dL -  Total Bilirubin 0.3 - 1.2 mg/dL -  Alkaline Phos 38 - 126 U/L -  AST 15 - 41 U/L -  ALT 0 - 44 U/L -   CBC Latest Ref Rng & Units 08/10/2021  WBC 4.0 - 10.5 K/uL 4.4  Hemoglobin 13.0 - 17.0 g/dL 8.8(L)  Hematocrit 39.0 - 52.0 % 29.0(L)  Platelets 150 - 400 K/uL 155    No images are attached to the encounter.  NM PET Image Restag (PS) Skull Base To Thigh  Result Date: 07/22/2021 CLINICAL DATA:  Subsequent treatment strategy for non-small cell lung cancer diagnosed in 2021 post SB RT which was completed in November 2021. EXAM: NUCLEAR MEDICINE PET SKULL BASE TO THIGH TECHNIQUE: 10.8 mCi F-18 FDG was injected intravenously. Full-ring PET imaging was performed from the skull base to thigh after the radiotracer. CT data was obtained and used for attenuation correction and anatomic localization. Fasting blood glucose: 74 mg/dl COMPARISON:  PET-CT 02/12/2020.  Chest CT 07/08/2021 and 01/02/2021. FINDINGS: Mediastinal blood pool activity: SUV max NECK: No hypermetabolic cervical lymph nodes are identified.There are no lesions of the pharyngeal mucosal space. Activity within the lymphoid tissue of Waldeyer's ring is within physiologic limits. Incidental CT findings: Bilateral carotid atherosclerosis and a right carotid stent are again noted. There is chronic right frontotemporal encephalomalacia,  incompletely visualized. CHEST: The enlarging lymph node posterior to the left pulmonary artery is hypermetabolic with an SUV max of 12.0. This node measures 3.0 x 1.2 cm on image 103/3. There is another small hypermetabolic nodule superior to the left hilum (SUV max 5.1). Corresponding finding on the CT images is not well demonstrated, and this may reflect a lymph node or small central pulmonary nodule. No other hypermetabolic lymph nodes or suspicious pulmonary nodules. There are radiation changes posteriorly in the left upper lobe at the site of the radiated primary malignancy with mild residual hypermetabolic activity (SUV max 2.4). Incidental CT findings: Diffuse atherosclerosis of the aorta, great vessels and coronary arteries. Moderate centrilobular emphysema. No significant pleural or pericardial effusion. ABDOMEN/PELVIS: There is no hypermetabolic activity within the liver, adrenal glands, spleen or pancreas. There is no hypermetabolic nodal activity. There is mild hypermetabolic activity associated with diffuse sigmoid colon wall thickening, similar to previous study. No surrounding inflammatory changes. Incidental CT findings: Diffuse aortic and branch vessel atherosclerosis again demonstrated with aneurysmal dilatation of the infrarenal aorta. As measured in a similar fashion to the previous PET-CT, this currently measures up to 4.3 cm on the coronal images. This has been evaluated by CTA 01/27/2021, and does not appear changed from that study as measured in a similar fashion. As above, diffuse sigmoid colon diverticulosis. SKELETON: There is no hypermetabolic activity to suggest osseous metastatic disease. Incidental CT findings: Previous right total hip arthroplasty. Multilevel lumbar spondylosis. IMPRESSION: 1. The  enlarging posterior left hilar lymph node is hypermetabolic, consistent with local recurrence of lung cancer. In addition, there is additional hypermetabolic activity superior to the left  hilum which may reflect an intrapulmonary nodal or pulmonary metastasis. 2. No other evidence of metastatic disease. 3. Treated primary lesion posteriorly in the left upper lobe with surrounding radiation changes, but no significant hypermetabolic activity. 4. 4.3 cm abdominal aortic aneurysm, not significantly changed from CTA 01/27/2021. See follow up recommendations from that examination. 5. Aortic Atherosclerosis (ICD10-I70.0) and Emphysema (ICD10-J43.9). Electronically Signed   By: Richardean Sale M.D.   On: 07/22/2021 09:28   DG Chest Port 1 View  Result Date: 08/12/2021 CLINICAL DATA:  Status post left-sided bronchoscopy EXAM: PORTABLE CHEST 1 VIEW COMPARISON:  Chest x-ray 04/23/2020, CT chest CT chest 07/08/2021, PET-CT 07/21/2021 FINDINGS: Left infrahilar linear airspace disease likely reflecting atelectasis. Stable subtle scarring in the superior segment of the left lower lobe. No focal consolidation. No pleural effusion or pneumothorax. Heart and mediastinal contours are unremarkable. No acute osseous abnormality. IMPRESSION: 1. No acute cardiopulmonary disease. Electronically Signed   By: Kathreen Devoid M.D.   On: 08/12/2021 14:52     Assessment and plan- Patient is a 71 y.o. male with history of stage I left upper lobe lung cancer and 2021 s/p SBRT now with nodal recurrence.  He is here to discuss pathology results and further management  I have reviewed PET CT scan images independently and discussed findings with the patient.  Did show hypermetabolic left hilar adenopathy concerning for nodal recurrence.  He underwent EBUS guided bronchoscopy with Dr. Patsey Berthold and results were consistent with non-small cell lung cancer although further characterization was not possible.  We discussed that this would be treated as stage III disease given left hilar adenopathy in the absence of distant metastatic disease.  Typically for stage III lung cancer I would recommend concurrent chemoradiation with weekly  CarboTaxol along with 7 weeks of radiation.  Scans will be repeated after concurrent chemoradiation which Stable patient would ideally need 1 year of adjuvant immunotherapy.  Discussed risks and benefits of chemotherapy including all but not limited to nausea, vomiting, low blood counts, risk of infections and hospitalization.  Risk of infusion reaction and peripheral neuropathy associated with Taxol.  Treatment will be given with a curative intent.  Patient and his wife are concerned about costs involved.  They had a co-pay of roughly $500 when he received 5 days of SBRT and they are concerned what their cost would add up to if they were to proceed with concurrent chemoradiation at this time.  Patient also has [place] privileges although has never sought care at [facility].  He has a PCP appointment at [facility] in May 2023.  Discussed with the patient that ideally his lung cancer needs to be treated within the next 3 to 4 weeks.  They should try and find a way to get a PCP appointment sooner and see if he could get oncology care at the [facility] sooner.  Patient and his wife would like to think about their options and get back to [place] next week.    Overall survival for stage III A lung cancer at 5 years based on historical data could be as there is a 26% although in a randomized phase 3 trial looking at unresectable stage III non-small cell lung cancer treated with maintenance immunotherapy after chemoradiation showed PFS of 17 months versus 5 months with durvalumab and 5-year overall survival rates 43% versus 33% and median  overall survival 48 months versus 29 once with and without durvalumab respectively  Follow-up with me to be decided based on what patient and his wife decided about a week's time   Cancer Staging  Recurrent non-small cell lung cancer [facility]) Staging form: Lung, AJCC 8th Edition - Clinical stage from 08/18/2021: Stage Unknown (cTX, cN1, cM0) - Signed by Sindy Guadeloupe, MD on 08/18/2021   Visit Diagnosis 1. Goals of  care, counseling/discussion   2. Recurrent non-small cell lung cancer ([place])      Dr. Randa Evens, MD, MPH [facility] [phone] 08/18/2021 1:00 PM

## 2021-08-18 NOTE — H&P (View-Only) (Signed)
Hematology/Oncology Consult note [facility]  [phone] [phone]  Patient Care Team: Pcp, No as PCP - General Kate Sable, MD as PCP - Cardiology (Cardiology) Telford Nab, RN as Oncology Nurse Navigator Noreene Filbert, MD as Referring Physician (Radiation Oncology) Sindy Guadeloupe, MD as Consulting Physician (Oncology)   Name of the patient: Richard Wiggins  [id number]  10-17-1950   Date of visit: 08/18/21  Diagnosis- stage I lung cancer s/p SBRT now with recurrence and hilar adenopathy  Chief complaint/ Reason for visit-discuss pathology results and further management  Heme/Onc history: Patient is a 71 year old male who was a former smoker and quit in 2018.  He went to the ER in July 2021 for possible strokelike symptoms and was found to have a left upper lobe lung nodule which was followed up with a CT chest which showed a 1.2 x 1.1 cm left upper lobe spiculated nodule.  This was followed by a PET scan which showed that nodule was hypermetabolic with an SUV of 3.81 patient was also incidentally noted to have a 4.3 cm abdominal aortic aneurysm.  He follows up with Dr. Lucky Cowboy for his aneurysm.  He was seen by Dr. Patsey Berthold and underwent ENB.  Biopsy was nondiagnostic.  However given that the nodule was hypermetabolic it was concerning for malignancy and recommendation per Dr. Patsey Berthold was empiric radiation. Patient underwent SBRT to his left upper lobe by Dr. Baruch Gouty   Patient also has a positive fit test.  Colonoscopy in January 2022 showed no evidence of colon cancer.  There wereCouple of polyps which were resected and were consistent with tubular adenoma  Patient noted to have enlarging left hilar lymph node which was PET positiveIn January 2023.  He had EBUS guided bronchoscopy with Dr. Patsey Berthold which was consistent with non-small cell lung cancer  Interval history-patient is here with his wife today.  Other than mild fatigue he  is otherwise doing well and denies any specific complaints at this time  ECOG PS- 1 Pain scale- 0   Review of systems- Review of Systems  Constitutional:  Positive for malaise/fatigue. Negative for chills, fever and weight loss.  HENT:  Negative for congestion, ear discharge and nosebleeds.   Eyes:  Negative for blurred vision.  Respiratory:  Negative for cough, hemoptysis, sputum production, shortness of breath and wheezing.   Cardiovascular:  Negative for chest pain, palpitations, orthopnea and claudication.  Gastrointestinal:  Negative for abdominal pain, blood in stool, constipation, diarrhea, heartburn, melena, nausea and vomiting.  Genitourinary:  Negative for dysuria, flank pain, frequency, hematuria and urgency.  Musculoskeletal:  Negative for back pain, joint pain and myalgias.  Skin:  Negative for rash.  Neurological:  Negative for dizziness, tingling, focal weakness, seizures, weakness and headaches.  Endo/Heme/Allergies:  Does not bruise/bleed easily.  Psychiatric/Behavioral:  Negative for depression and suicidal ideas. The patient does not have insomnia.       Allergies  Allergen Reactions   Tape Rash    plastic   Wound Dressing Adhesive Itching and Rash    Gets stuck to skin, makes wound spread     Past Medical History:  Diagnosis Date   AAA (abdominal aortic aneurysm) 2020   a. 2020 U/S: 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 -plan medical managmment with tight bp control; b. 09/2020 Abd U/S: 4.1cm.   Anxiety    Aortic atherosclerosis (HCC)    Benign essential HTN 02/10/2020   BPH (benign prostatic hyperplasia) 02/09/2020   Brain aneurysm  Cancer of upper lobe of left lung ([place]) 04/2020   a.) ENB/EBUS performed; Bx non-diagnostic. b.) presumed stage I NSCLC in the LUL; underwent SBRT (60 Gy over 5 fractions)   Carotid arterial disease ([place])    a. 05/2017 s/p R carotid endarterectomy following CVA.   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 2018 Echo: EF 30-35%; b. 02/2021 Echo: EF 55-60%, nl RV fxn. Triv MR. Asc Ao 50mm.   CKD (chronic kidney disease), stage III (HCC)    Claudication ([place])    a. 09/2020 ABIs: R 0.91. L 0.99.   COPD (chronic obstructive pulmonary disease) (HCC)    Coronary artery disease    a. 06/2017 PCI ([place]): s/p PCI to the RCA. LAD 50, LCX 20.   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 02/2021 Asc Ao 63mm.   Emphysema of lung (HCC)    HLD (hyperlipidemia)    IDA (iron deficiency anemia) 02/09/2020   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   Ischemic cardiomyopathy    Long term current use of anticoagulant    a.) apixaban   Marijuana use, continuous 02/10/2020   Osteoporosis    PAF (paroxysmal atrial fibrillation) ([place])    a. 05/2017 Dx in setting of CVA. CHA2DS2VASc = 6-->Eliquis.   Pre-diabetes    Pulmonary emboli ([place]) 06/07/2017   Right lower lobe pulmonary embolism small segmental, multifocal multifocal pneumonia, mediastinal lymphadenopathy, moderate centrilobular emphysema   Seizures ([place])    Sigmoid diverticulosis    a.) CT 01/27/2021: severe   Stroke [facility])    a. 05/2017 - hospitalized in [place]->prolonged hospitalization in setting of R CEA, PE, and finding of RCA dzs on cath; b. Residual left sided weakness to arm and leg.     Past Surgical History:  Procedure Laterality Date   brain aneurysm with clip     COLONOSCOPY WITH PROPOFOL N/A 07/23/2020   Procedure: COLONOSCOPY WITH PROPOFOL;  Surgeon: Lin Landsman, MD;  Location: [facility] ENDOSCOPY;  Service: Gastroenterology;  Laterality: N/A;   CORONARY ANGIOPLASTY WITH STENT PLACEMENT  07/08/2017   TOTAL HIP ARTHROPLASTY Right    VIDEO BRONCHOSCOPY WITH ENDOBRONCHIAL NAVIGATION N/A 04/23/2020   Procedure: VIDEO BRONCHOSCOPY WITH ENDOBRONCHIAL NAVIGATION;  Surgeon: Tyler Pita, MD;  Location: [facility] ORS;  Service: Pulmonary;  Laterality: N/A;   VIDEO BRONCHOSCOPY WITH ENDOBRONCHIAL ULTRASOUND Left  08/12/2021   Procedure: VIDEO BRONCHOSCOPY WITH ENDOBRONCHIAL ULTRASOUND;  Surgeon: Tyler Pita, MD;  Location: [facility] ORS;  Service: Cardiopulmonary;  Laterality: Left;    Social History   Socioeconomic History   Marital status: Married    Spouse name: Butch Penny   Number of children: Not on file   Years of education: Not on file   Highest education level: High school graduate  Occupational History   Occupation: Retired  Tobacco Use   Smoking status: Former    Packs/day: 2.00    Years: 53.00    Pack years: 106.00    Types: Cigarettes    Quit date: 05/28/2017    Years since quitting: 4.2   Smokeless tobacco: Never   Tobacco comments:    Quit in 2018  Vaping Use   Vaping Use: Former   Quit date: 01/15/2020   Devices: cbd   Substance and Sexual Activity   Alcohol use: Yes    Comment: socially drink cocktail   Drug use: Not Currently    Types: Marijuana    Comment: last smoke  x1 month ago   Sexual activity: Not Currently  Other Topics Concern   Not on file  Social History Narrative   Lives with wife. 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 Children x2 children and grandchildren grown.    Social Determinants of Health   Financial Resource Strain: Not on file  Food Insecurity: Not on file  Transportation Needs: Not on file  Physical Activity: Not on file  Stress: Not on file  Social Connections: Not on file  Intimate Partner Violence: Not on file    Family History  Problem Relation Age of Onset   Diabetes Mother    Heart disease Mother    Stroke Father    Heart disease Father    Heart attack Father    Alcohol abuse Father    Heart disease Sister    Heart attack Sister    Heart disease Maternal Grandmother    Heart attack Maternal Grandmother    Heart attack Paternal Grandmother    Diabetes Brother    Heart disease Brother    Heart attack Brother      Current Outpatient Medications:    albuterol (PROVENTIL) (2.5 MG/3ML) 0.083% nebulizer solution,  Take 3 mLs (2.5 mg total) by nebulization every 6 (six) hours as needed for wheezing or shortness of breath., Disp: 75 mL, Rfl: 6   albuterol (VENTOLIN HFA) 108 (90 Base) MCG/ACT inhaler, Inhale 2 puffs into the lungs every 6 (six) hours as needed for wheezing or shortness of breath., Disp: 8 g, Rfl: 2   aspirin EC 81 MG tablet, Take 81 mg by mouth daily. Swallow whole., Disp: , Rfl:    atorvastatin (LIPITOR) 40 MG tablet, TAKE 1 TABLET(40 MG) BY MOUTH AT BEDTIME, Disp: 90 tablet, Rfl: 3   Budeson-Glycopyrrol-Formoterol (BREZTRI AEROSPHERE) 160-9-4.8 MCG/ACT AERO, Inhale 2 puffs into the lungs in the morning and at bedtime., Disp: 10.7 g, Rfl: 11   carvedilol (COREG) 25 MG tablet, Take 0.5 tablets (12.5 mg total) by mouth in the morning and at bedtime., Disp: 90 tablet, Rfl: 0   ELIQUIS 5 MG TABS tablet, TAKE 1 TABLET(5 MG) BY MOUTH IN THE MORNING AND AT BEDTIME, Disp: 90 tablet, Rfl: 2   lamoTRIgine (LAMICTAL) 100 MG tablet, Take 100 mg by mouth 2 (two) times daily., Disp: , Rfl:    Lidocaine HCl-Benzyl Alcohol (SALONPAS LIDOCAINE PLUS EX), Place 1 patch onto the skin daily as needed (pain.)., Disp: , Rfl:    Multiple Vitamin (MULTI-VITAMIN) tablet, Take 1 tablet by mouth daily., Disp: , Rfl:    sacubitril-valsartan (ENTRESTO) 49-51 MG, Take 0.5 tablets by mouth in the morning and at bedtime., Disp: , Rfl:    spironolactone (ALDACTONE) 25 MG tablet, Take 0.5 tablets (12.5 mg total) by mouth at bedtime., Disp: 45 tablet, Rfl: 0   tamsulosin (FLOMAX) 0.4 MG CAPS capsule, Take 1 capsule (0.4 mg total) by mouth daily., Disp: 90 capsule, Rfl: 0   furosemide (LASIX) 40 MG tablet, Take 1 tablet (40 mg total) by mouth as needed., Disp: 90 tablet, Rfl: 2  Physical exam:  Vitals:   08/18/21 1031  BP: 132/78  Pulse: 71  Resp: 16  Temp: (!) 97 F (36.1 C)  TempSrc: Tympanic  SpO2: 99%  Weight: 194 lb 14.4 oz (88.4 kg)  Height: 5\' 10"  (1.778 m)   Physical Exam Cardiovascular:     Rate and Rhythm:  Normal rate and regular rhythm.     Heart sounds: Normal heart sounds.  Pulmonary:  Effort: Pulmonary effort is normal.     Breath sounds: Normal breath sounds.  Skin:    General: Skin is warm and dry.  Neurological:     Mental Status: He is alert and oriented to person, place, and time.     CMP Latest Ref Rng & Units 08/10/2021  Glucose 70 - 99 mg/dL 78  BUN 8 - 23 mg/dL 29(H)  Creatinine 0.61 - 1.24 mg/dL 1.43(H)  Sodium 135 - 145 mmol/L 137  Potassium 3.5 - 5.1 mmol/L 4.8  Chloride 98 - 111 mmol/L 103  CO2 22 - 32 mmol/L 27  Calcium 8.9 - 10.3 mg/dL 9.1  Total Protein 6.5 - 8.1 g/dL -  Total Bilirubin 0.3 - 1.2 mg/dL -  Alkaline Phos 38 - 126 U/L -  AST 15 - 41 U/L -  ALT 0 - 44 U/L -   CBC Latest Ref Rng & Units 08/10/2021  WBC 4.0 - 10.5 K/uL 4.4  Hemoglobin 13.0 - 17.0 g/dL 8.8(L)  Hematocrit 39.0 - 52.0 % 29.0(L)  Platelets 150 - 400 K/uL 155    No images are attached to the encounter.  NM PET Image Restag (PS) Skull Base To Thigh  Result Date: 07/22/2021 CLINICAL DATA:  Subsequent treatment strategy for non-small cell lung cancer diagnosed in 2021 post SB RT which was completed in November 2021. EXAM: NUCLEAR MEDICINE PET SKULL BASE TO THIGH TECHNIQUE: 10.8 mCi F-18 FDG was injected intravenously. Full-ring PET imaging was performed from the skull base to thigh after the radiotracer. CT data was obtained and used for attenuation correction and anatomic localization. Fasting blood glucose: 74 mg/dl COMPARISON:  PET-CT 02/12/2020.  Chest CT 07/08/2021 and 01/02/2021. FINDINGS: Mediastinal blood pool activity: SUV max NECK: No hypermetabolic cervical lymph nodes are identified.There are no lesions of the pharyngeal mucosal space. Activity within the lymphoid tissue of Waldeyer's ring is within physiologic limits. Incidental CT findings: Bilateral carotid atherosclerosis and a right carotid stent are again noted. There is chronic right frontotemporal encephalomalacia,  incompletely visualized. CHEST: The enlarging lymph node posterior to the left pulmonary artery is hypermetabolic with an SUV max of 12.0. This node measures 3.0 x 1.2 cm on image 103/3. There is another small hypermetabolic nodule superior to the left hilum (SUV max 5.1). Corresponding finding on the CT images is not well demonstrated, and this may reflect a lymph node or small central pulmonary nodule. No other hypermetabolic lymph nodes or suspicious pulmonary nodules. There are radiation changes posteriorly in the left upper lobe at the site of the radiated primary malignancy with mild residual hypermetabolic activity (SUV max 2.4). Incidental CT findings: Diffuse atherosclerosis of the aorta, great vessels and coronary arteries. Moderate centrilobular emphysema. No significant pleural or pericardial effusion. ABDOMEN/PELVIS: There is no hypermetabolic activity within the liver, adrenal glands, spleen or pancreas. There is no hypermetabolic nodal activity. There is mild hypermetabolic activity associated with diffuse sigmoid colon wall thickening, similar to previous study. No surrounding inflammatory changes. Incidental CT findings: Diffuse aortic and branch vessel atherosclerosis again demonstrated with aneurysmal dilatation of the infrarenal aorta. As measured in a similar fashion to the previous PET-CT, this currently measures up to 4.3 cm on the coronal images. This has been evaluated by CTA 01/27/2021, and does not appear changed from that study as measured in a similar fashion. As above, diffuse sigmoid colon diverticulosis. SKELETON: There is no hypermetabolic activity to suggest osseous metastatic disease. Incidental CT findings: Previous right total hip arthroplasty. Multilevel lumbar spondylosis. IMPRESSION: 1. The  enlarging posterior left hilar lymph node is hypermetabolic, consistent with local recurrence of lung cancer. In addition, there is additional hypermetabolic activity superior to the left  hilum which may reflect an intrapulmonary nodal or pulmonary metastasis. 2. No other evidence of metastatic disease. 3. Treated primary lesion posteriorly in the left upper lobe with surrounding radiation changes, but no significant hypermetabolic activity. 4. 4.3 cm abdominal aortic aneurysm, not significantly changed from CTA 01/27/2021. See follow up recommendations from that examination. 5. Aortic Atherosclerosis (ICD10-I70.0) and Emphysema (ICD10-J43.9). Electronically Signed   By: Richardean Sale M.D.   On: 07/22/2021 09:28   DG Chest Port 1 View  Result Date: 08/12/2021 CLINICAL DATA:  Status post left-sided bronchoscopy EXAM: PORTABLE CHEST 1 VIEW COMPARISON:  Chest x-ray 04/23/2020, CT chest CT chest 07/08/2021, PET-CT 07/21/2021 FINDINGS: Left infrahilar linear airspace disease likely reflecting atelectasis. Stable subtle scarring in the superior segment of the left lower lobe. No focal consolidation. No pleural effusion or pneumothorax. Heart and mediastinal contours are unremarkable. No acute osseous abnormality. IMPRESSION: 1. No acute cardiopulmonary disease. Electronically Signed   By: Kathreen Devoid M.D.   On: 08/12/2021 14:52     Assessment and plan- Patient is a 71 y.o. male with history of stage I left upper lobe lung cancer and 2021 s/p SBRT now with nodal recurrence.  He is here to discuss pathology results and further management  I have reviewed PET CT scan images independently and discussed findings with the patient.  Did show hypermetabolic left hilar adenopathy concerning for nodal recurrence.  He underwent EBUS guided bronchoscopy with Dr. Patsey Berthold and results were consistent with non-small cell lung cancer although further characterization was not possible.  We discussed that this would be treated as stage III disease given left hilar adenopathy in the absence of distant metastatic disease.  Typically for stage III lung cancer I would recommend concurrent chemoradiation with weekly  CarboTaxol along with 7 weeks of radiation.  Scans will be repeated after concurrent chemoradiation which Stable patient would ideally need 1 year of adjuvant immunotherapy.  Discussed risks and benefits of chemotherapy including all but not limited to nausea, vomiting, low blood counts, risk of infections and hospitalization.  Risk of infusion reaction and peripheral neuropathy associated with Taxol.  Treatment will be given with a curative intent.  Patient and his wife are concerned about costs involved.  They had a co-pay of roughly $500 when he received 5 days of SBRT and they are concerned what their cost would add up to if they were to proceed with concurrent chemoradiation at this time.  Patient also has [place] privileges although has never sought care at [facility]  He has a PCP appointment at [facility] in May 2023.  Discussed with the patient that ideally his lung cancer needs to be treated within the next 3 to 4 weeks.  They should try and find a way to get a PCP appointment sooner and see if he could get oncology care at the [facility] sooner.  Patient and his wife would like to think about their options and get back to [place] next week.    Overall survival for stage III A lung cancer at 5 years based on historical data could be as there is a 26% although in a randomized phase 3 trial looking at unresectable stage III non-small cell lung cancer treated with maintenance immunotherapy after chemoradiation showed PFS of 17 months versus 5 months with durvalumab and 5-year overall survival rates 43% versus 33% and median  overall survival 48 months versus 29 once with and without durvalumab respectively  Follow-up with me to be decided based on what patient and his wife decided about a week's time   Cancer Staging  Recurrent non-small cell lung cancer [facility]) Staging form: Lung, AJCC 8th Edition - Clinical stage from 08/18/2021: Stage Unknown (cTX, cN1, cM0) - Signed by Sindy Guadeloupe, MD on 08/18/2021   Visit Diagnosis 1. Goals of  care, counseling/discussion   2. Recurrent non-small cell lung cancer ([place])      Dr. Randa Evens, MD, MPH [facility] [phone] 08/18/2021 1:00 PM

## 2021-08-18 NOTE — Progress Notes (Signed)
Radiation Oncology Follow up Note  Name: Richard Wiggins   Date:   08/18/2021 MRN:  [id number] DOB: [birth year]    This 71 y.o. male presents to the clinic today for reevaluation of recurrent non-small cell lung cancer of the left lung patient treated approximately a year ago with SBRT to his left upper lobe for presumed stage I non-small cell lung cancer.  REFERRING PROVIDER: No ref. provider found  HPI: Patient is a 71 year old male who we have been following status post SBRT approximately year ago for presumed non-small cell lung cancer stage one of his left upper lobe..  He he did well with extremely low side effect profile.  On the repeat CT scans he recently was noted to have left hilar adenopathy progressing.  PET CT scan was performed showing enlarging posterior left hilar lymph node is hypermetabolic consistent with local recurrent disease there is also additional hypermetabolic to be seen in the left hilum which may reflect intrapulmonary nodal or pulmonary metastasis.  No other evidence of metastatic disease is noted.  He underwent EBUS by Dr. Patsey Berthold which was positive for ligamentously showing metastatic non-small cell lung cancer.  Patient continues to be asymptomatic.  Mild cough no hemoptysis or chest tightness.  He is seen today for consideration of further treatment. COMPLICATIONS OF TREATMENT: none  FOLLOW UP COMPLIANCE: keeps appointments   PHYSICAL EXAM:  There were no vitals taken for this visit. Well-developed well-nourished patient in NAD. HEENT reveals PERLA, EOMI, discs not visualized.  Oral cavity is clear. No oral mucosal lesions are identified. Neck is clear without evidence of cervical or supraclavicular adenopathy. Lungs are clear to A&P. Cardiac examination is essentially unremarkable with regular rate and rhythm without murmur rub or thrill. Abdomen is benign with no organomegaly or masses noted. Motor sensory and DTR levels are equal and symmetric in the upper and  lower extremities. Cranial nerves II through XII are grossly intact. Proprioception is intact. No peripheral adenopathy or edema is identified. No motor or sensory levels are noted. Crude visual fields are within normal range.  RADIOLOGY RESULTS: PET/CT and CT scans reviewed compatible with above-stated findings. Cytology report reviewed  PLAN: At this time I believe her #1 treatment plan would be to treat this is a stage IIIa disease with concurrent chemo and radiation therapy.  Patient has concerns about cost of treatment.  Another option I have reviewed with the patient and his wife as well as Dr. Janese Banks is to do a hypofractionated course of treatment 60 Gray in 10 fractions using IMRT treatment planning and delivery with shorten course of chemotherapy followed by immunotherapy.  My #1 preference would be for 7-week course of external beam IMRT treatment with concurrent chemotherapy as this is a standard of care and stage IIIa disease.  Patient and wife both comprehend my recommendations well  Scheduled him early next week for CT simulation.  I will use for dimensional treatment planning as well as motion restriction for treatment planning purposes.  Patient and wife will make a decision by then on course of treatment.  Case was discussed personally with medical oncology.  I would like to take this opportunity to thank you for allowing me to participate in the care of your patient.Noreene Filbert, MD

## 2021-08-18 NOTE — Telephone Encounter (Signed)
Disability parking placard has been placed up front for pickup. Patient's spouse, Donna(DPR) is aware and voiced her understanding. Nothing further needed

## 2021-08-19 ENCOUNTER — Encounter: Payer: Self-pay | Admitting: Oncology

## 2021-08-20 ENCOUNTER — Encounter: Payer: Self-pay | Admitting: Podiatry

## 2021-08-20 ENCOUNTER — Encounter: Payer: Self-pay | Admitting: Cardiology

## 2021-08-20 ENCOUNTER — Other Ambulatory Visit: Payer: Self-pay

## 2021-08-20 ENCOUNTER — Ambulatory Visit: Payer: Medicare Other | Admitting: Podiatry

## 2021-08-20 DIAGNOSIS — B351 Tinea unguium: Secondary | ICD-10-CM | POA: Diagnosis not present

## 2021-08-20 DIAGNOSIS — I739 Peripheral vascular disease, unspecified: Secondary | ICD-10-CM

## 2021-08-20 DIAGNOSIS — Z7901 Long term (current) use of anticoagulants: Secondary | ICD-10-CM | POA: Diagnosis not present

## 2021-08-20 DIAGNOSIS — I480 Paroxysmal atrial fibrillation: Secondary | ICD-10-CM

## 2021-08-20 DIAGNOSIS — I6529 Occlusion and stenosis of unspecified carotid artery: Secondary | ICD-10-CM

## 2021-08-20 DIAGNOSIS — M79674 Pain in right toe(s): Secondary | ICD-10-CM | POA: Diagnosis not present

## 2021-08-20 DIAGNOSIS — M79675 Pain in left toe(s): Secondary | ICD-10-CM | POA: Diagnosis not present

## 2021-08-20 DIAGNOSIS — I639 Cerebral infarction, unspecified: Secondary | ICD-10-CM

## 2021-08-20 MED ORDER — APIXABAN 5 MG PO TABS: ORAL_TABLET | ORAL | 3 refills | Status: DC

## 2021-08-20 NOTE — Progress Notes (Signed)
This patient returns to my office for at risk foot care.  This patient requires this care by a professional since this patient will be at risk due to having coagulation defect and PAD.  This patient is unable to cut nails himself since the patient cannot reach his nails.These nails are painful walking and wearing shoes.  This patient presents for at risk foot care today.  Patient presents to the office with his wife.  General Appearance  Alert, conversant and in no acute stress.  Vascular  Dorsalis pedis and posterior tibial  pulses are palpable  bilaterally.  Capillary return is within normal limits  bilaterally. Temperature is within normal limits  bilaterally.  Neurologic  Senn-Weinstein monofilament wire test within normal limits  bilaterally. Muscle power within normal limits bilaterally.  Nails Thick disfigured discolored nails with subungual debris  hallux nails bilaterally. No evidence of bacterial infection or drainage bilaterally. Hallux pincer nails.  Orthopedic  No limitations of motion  feet .  No crepitus or effusions noted.  No bony pathology or digital deformities noted.  Skin  normotropic skin with no porokeratosis noted bilaterally.  No signs of infections or ulcers noted.     Onychomycosis  Pain in right toes  Pain in left toes  Consent was obtained for treatment procedures.   Mechanical debridement of nails 1-5  bilaterally performed with a nail nipper.  Filed with dremel without incident.    Return office visit    3 months                 Told patient to return for periodic foot care and evaluation due to potential at risk complications.   Gardiner Barefoot DPM

## 2021-08-20 NOTE — Telephone Encounter (Signed)
Prescription refill request for Eliquis received. Indication: Afib  Last office visit:03/11/21 ([place])  Scr: 1.43 (08/10/21)  Age: 71 Weight: 88.4kg  Appropriate dose and refill sent to requested pharmacy.

## 2021-08-21 NOTE — Telephone Encounter (Signed)
Chemo weekly carbo taxol X2 to start when radiation starts

## 2021-08-24 ENCOUNTER — Ambulatory Visit: Admission: RE | Admit: 2021-08-24 | Payer: Medicare Other | Source: Ambulatory Visit

## 2021-08-24 DIAGNOSIS — C3412 Malignant neoplasm of upper lobe, left bronchus or lung: Secondary | ICD-10-CM | POA: Insufficient documentation

## 2021-08-24 DIAGNOSIS — Z510 Encounter for antineoplastic radiation therapy: Secondary | ICD-10-CM | POA: Insufficient documentation

## 2021-08-24 DIAGNOSIS — Z87891 Personal history of nicotine dependence: Secondary | ICD-10-CM | POA: Diagnosis not present

## 2021-08-25 ENCOUNTER — Other Ambulatory Visit: Payer: Self-pay | Admitting: *Deleted

## 2021-08-25 DIAGNOSIS — C3490 Malignant neoplasm of unspecified part of unspecified bronchus or lung: Secondary | ICD-10-CM

## 2021-08-25 DIAGNOSIS — C3412 Malignant neoplasm of upper lobe, left bronchus or lung: Secondary | ICD-10-CM

## 2021-08-26 ENCOUNTER — Other Ambulatory Visit: Payer: Self-pay | Admitting: Oncology

## 2021-08-27 ENCOUNTER — Telehealth: Payer: Self-pay | Admitting: *Deleted

## 2021-08-27 ENCOUNTER — Other Ambulatory Visit: Payer: Self-pay | Admitting: *Deleted

## 2021-08-27 ENCOUNTER — Telehealth (INDEPENDENT_AMBULATORY_CARE_PROVIDER_SITE_OTHER): Payer: Self-pay

## 2021-08-27 ENCOUNTER — Other Ambulatory Visit: Payer: Self-pay | Admitting: Oncology

## 2021-08-27 DIAGNOSIS — C3490 Malignant neoplasm of unspecified part of unspecified bronchus or lung: Secondary | ICD-10-CM

## 2021-08-27 MED ORDER — LIDOCAINE-PRILOCAINE 2.5-2.5 % EX CREA: TOPICAL_CREAM | CUTANEOUS | 3 refills | Status: AC

## 2021-08-27 MED ORDER — ONDANSETRON HCL 8 MG PO TABS: 8.0000 mg | ORAL_TABLET | Freq: Two times a day (BID) | ORAL | 1 refills | Status: DC | PRN

## 2021-08-27 MED ORDER — PROCHLORPERAZINE MALEATE 10 MG PO TABS: 10.0000 mg | ORAL_TABLET | Freq: Four times a day (QID) | ORAL | 1 refills | Status: DC | PRN

## 2021-08-27 MED ORDER — DEXAMETHASONE 4 MG PO TABS: 8.0000 mg | ORAL_TABLET | Freq: Every day | ORAL | 1 refills | Status: DC

## 2021-08-27 MED ORDER — LORAZEPAM 0.5 MG PO TABS: 0.5000 mg | ORAL_TABLET | Freq: Four times a day (QID) | ORAL | 0 refills | Status: DC | PRN

## 2021-08-27 NOTE — Telephone Encounter (Signed)
I called today to f/u from pt . Advise that wife had with all these things that needs to be set up in order to start the treatment and she was feeling overwhelmed. I did leave a message she was not home. I did go over the appt for port insertion and she had a call from Mickel Baas at vascular also saying the same that I am telling also. Also jsust a reminder that they will need to be in medical mall 7:45 and come off eliquis 2 days prior. I left my direct number for her to call me and that I did send referral to social work to see if that can be any help for them

## 2021-08-27 NOTE — Telephone Encounter (Signed)
Spoke with the patient's spouse and he is scheduled with Dr. Lucky Cowboy for a port placement on 08/31/21 with a 7:45 am arrival time to the MM. Pre-procedure instructions were discussed and patient's spouse understood.

## 2021-08-27 NOTE — Progress Notes (Signed)
START OFF PATHWAY REGIMEN - Non-Small Cell Lung   [id number]:Carboplatin AUC=2 + Paclitaxel 45 mg/m2 Weekly:   Administer weekly:     Paclitaxel      Carboplatin   **Always confirm dose/schedule in your pharmacy ordering system**  Patient Characteristics: Local Recurrence Therapeutic Status: Local Recurrence Intent of Therapy: Curative Intent, Discussed with Patient

## 2021-08-28 ENCOUNTER — Inpatient Hospital Stay: Payer: No Typology Code available for payment source

## 2021-08-28 ENCOUNTER — Other Ambulatory Visit: Payer: Self-pay

## 2021-08-29 DIAGNOSIS — Z510 Encounter for antineoplastic radiation therapy: Secondary | ICD-10-CM | POA: Diagnosis not present

## 2021-08-29 DIAGNOSIS — C3412 Malignant neoplasm of upper lobe, left bronchus or lung: Secondary | ICD-10-CM | POA: Diagnosis not present

## 2021-08-29 DIAGNOSIS — Z87891 Personal history of nicotine dependence: Secondary | ICD-10-CM | POA: Diagnosis not present

## 2021-08-30 ENCOUNTER — Other Ambulatory Visit (INDEPENDENT_AMBULATORY_CARE_PROVIDER_SITE_OTHER): Payer: Self-pay | Admitting: Nurse Practitioner

## 2021-08-31 ENCOUNTER — Encounter: Payer: Self-pay | Admitting: Licensed Clinical Social Worker

## 2021-08-31 ENCOUNTER — Ambulatory Visit
Admission: RE | Admit: 2021-08-31 | Discharge: 2021-08-31 | Disposition: A | Discharge status: Home / Self Care | Payer: Medicare Other | Attending: Vascular Surgery | Admitting: Vascular Surgery

## 2021-08-31 ENCOUNTER — Encounter
Admission: RE | Disposition: A | Discharge status: Home / Self Care | Payer: Self-pay | Source: Home / Self Care | Attending: Vascular Surgery

## 2021-08-31 ENCOUNTER — Other Ambulatory Visit: Payer: Self-pay

## 2021-08-31 ENCOUNTER — Ambulatory Visit: Payer: Medicare Other

## 2021-08-31 ENCOUNTER — Encounter: Payer: Self-pay | Admitting: Vascular Surgery

## 2021-08-31 DIAGNOSIS — C3490 Malignant neoplasm of unspecified part of unspecified bronchus or lung: Secondary | ICD-10-CM

## 2021-08-31 DIAGNOSIS — Z87891 Personal history of nicotine dependence: Secondary | ICD-10-CM | POA: Insufficient documentation

## 2021-08-31 DIAGNOSIS — I714 Abdominal aortic aneurysm, without rupture, unspecified: Secondary | ICD-10-CM | POA: Diagnosis not present

## 2021-08-31 DIAGNOSIS — C3412 Malignant neoplasm of upper lobe, left bronchus or lung: Secondary | ICD-10-CM | POA: Insufficient documentation

## 2021-08-31 DIAGNOSIS — R590 Localized enlarged lymph nodes: Secondary | ICD-10-CM | POA: Insufficient documentation

## 2021-08-31 HISTORY — PX: PORTA CATH INSERTION: CATH118285

## 2021-08-31 SURGERY — PORTA CATH INSERTION
Anesthesia: Moderate Sedation

## 2021-08-31 MED ORDER — CEFAZOLIN SODIUM-DEXTROSE 1-4 GM/50ML-% IV SOLN
INTRAVENOUS | Status: DC | PRN
  Administered 2021-08-31: 2 g via INTRAVENOUS

## 2021-08-31 MED ORDER — CEFAZOLIN SODIUM-DEXTROSE 2-4 GM/100ML-% IV SOLN: 2.0000 g | Freq: Once | INTRAVENOUS | Status: DC

## 2021-08-31 MED ORDER — CEFAZOLIN SODIUM-DEXTROSE 2-4 GM/100ML-% IV SOLN
INTRAVENOUS | Status: AC
  Filled 2021-08-31: qty 100

## 2021-08-31 MED ORDER — FAMOTIDINE 20 MG PO TABS: 40.0000 mg | ORAL_TABLET | Freq: Once | ORAL | Status: DC | PRN

## 2021-08-31 MED ORDER — SODIUM CHLORIDE 0.9 % IV SOLN: INTRAVENOUS | Status: DC

## 2021-08-31 MED ORDER — SODIUM CHLORIDE 0.9 % IV SOLN
80.0000 mg | Freq: Once | INTRAVENOUS | Status: AC
  Administered 2021-08-31: 80 mg
  Filled 2021-08-31: qty 2

## 2021-08-31 MED ORDER — MIDAZOLAM HCL 2 MG/2ML IJ SOLN
INTRAMUSCULAR | Status: DC | PRN
  Administered 2021-08-31: 2 mg via INTRAVENOUS
  Administered 2021-08-31: 1 mg via INTRAVENOUS

## 2021-08-31 MED ORDER — ONDANSETRON HCL 4 MG/2ML IJ SOLN: 4.0000 mg | Freq: Four times a day (QID) | INTRAMUSCULAR | Status: DC | PRN

## 2021-08-31 MED ORDER — MIDAZOLAM HCL 2 MG/ML PO SYRP: 8.0000 mg | ORAL_SOLUTION | Freq: Once | ORAL | Status: DC | PRN

## 2021-08-31 MED ORDER — DIPHENHYDRAMINE HCL 50 MG/ML IJ SOLN: 50.0000 mg | Freq: Once | INTRAMUSCULAR | Status: DC | PRN

## 2021-08-31 MED ORDER — SPIRONOLACTONE 25 MG PO TABS: 12.5000 mg | ORAL_TABLET | Freq: Every day | ORAL | 0 refills | Status: DC

## 2021-08-31 MED ORDER — CHLORHEXIDINE GLUCONATE CLOTH 2 % EX PADS
6.0000 | MEDICATED_PAD | Freq: Every day | CUTANEOUS | Status: DC
Start: 2021-08-31 — End: 2021-08-31
  Administered 2021-08-31: 6 via TOPICAL

## 2021-08-31 MED ORDER — METHYLPREDNISOLONE SODIUM SUCC 125 MG IJ SOLR: 125.0000 mg | Freq: Once | INTRAMUSCULAR | Status: DC | PRN

## 2021-08-31 MED ORDER — HYDROMORPHONE HCL 1 MG/ML IJ SOLN: 1.0000 mg | Freq: Once | INTRAMUSCULAR | Status: DC | PRN

## 2021-08-31 MED ORDER — MIDAZOLAM HCL 5 MG/5ML IJ SOLN
INTRAMUSCULAR | Status: AC
  Filled 2021-08-31: qty 5

## 2021-08-31 MED ORDER — FENTANYL CITRATE PF 50 MCG/ML IJ SOSY
PREFILLED_SYRINGE | INTRAMUSCULAR | Status: AC
  Filled 2021-08-31: qty 1

## 2021-08-31 MED ORDER — FENTANYL CITRATE (PF) 100 MCG/2ML IJ SOLN
INTRAMUSCULAR | Status: DC | PRN
  Administered 2021-08-31: 50 ug via INTRAVENOUS

## 2021-08-31 SURGICAL SUPPLY — 13 items
ADH SKN CLS APL DERMABOND .7 (GAUZE/BANDAGES/DRESSINGS) ×1
COVER PROBE U/S 5X48 (MISCELLANEOUS) ×1 IMPLANT
COVER SURGICAL LIGHT HANDLE (MISCELLANEOUS) ×1 IMPLANT
DERMABOND ADVANCED (GAUZE/BANDAGES/DRESSINGS) ×1
DERMABOND ADVANCED .7 DNX12 (GAUZE/BANDAGES/DRESSINGS) IMPLANT
HANDLE YANKAUER SUCT BULB TIP (MISCELLANEOUS) ×1 IMPLANT
KIT PORT POWER 8FR ISP CVUE (Port) ×1 IMPLANT
PACK ANGIOGRAPHY (CUSTOM PROCEDURE TRAY) ×2 IMPLANT
PENCIL ELECTRO HAND CTR (MISCELLANEOUS) ×1 IMPLANT
SUT MNCRL AB 4-0 PS2 18 (SUTURE) ×1 IMPLANT
SUT VIC AB 3-0 SH 27 (SUTURE) ×2
SUT VIC AB 3-0 SH 27X BRD (SUTURE) IMPLANT
TUBING CONNECTING 10 (TUBING) ×2 IMPLANT

## 2021-08-31 NOTE — Interval H&P Note (Signed)
History and Physical Interval Note:  08/31/2021 8:01 AM  Richard Wiggins  has presented today for surgery, with the diagnosis of Porta Cath Placement  Lung Ca.  The various methods of treatment have been discussed with the patient and family. After consideration of risks, benefits and other options for treatment, the patient has consented to  Procedure(s): PORTA CATH INSERTION (N/A) as a surgical intervention.  The patient's history has been reviewed, patient examined, no change in status, stable for surgery.  I have reviewed the patient's chart and labs.  Questions were answered to the patient's satisfaction.     [name]

## 2021-08-31 NOTE — Progress Notes (Signed)
[place] Clinical Social Work  Initial Assessment   Richard Wiggins is a 71 y.o. year old male contacted by phone and spoke to [name], Wiggins (Spouse) [phone] [facility])  Clinical Social Work was referred by Dr.Rao for assessment of psychosocial needs.   SDOH (Social Determinants of Health) assessments performed: Yes   Distress Screen completed: No No flowsheet data found.    Family/Social Information:  Housing Arrangement: patient lives with spouse , Richard, Wiggins (Spouse) [phone] (Mobile) Family members/support persons in your life? Family Transportation concerns: no  Employment: Retired. Income source: Conservation officer, historic buildings and [place] Benefit Financial concerns: No, but concerned about future medical expenses Type of concern: Medical bills Food access concerns: no Religious or spiritual practice: yes Services Currently in place:  [place] Benefits/Managed Medicare  Coping/ Adjustment to diagnosis: Patient understands treatment plan and what happens next? 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  Richard Wiggins main concerns are about the increasing medical bills, and patient care needs.  Richard Wiggins stated the patient is not active with the [facility] yet and has an appointment with his PCP in May.  Richard Wiggins stated the [employer] will not cover any medical  expenses until the patient is established with a PCP in the [place]. CSW [facility] and [facility] are any [facility], so the pateint can see the physician sooner.  Richard Wiggins verbalized understanding.  Clinical Social Work Clinical Goal(s):  patient will work with SW to address concerns related to stress and anxiety  Interventions: Discussed common feeling and emotions when being diagnosed with cancer, and the importance of support during treatment Informed patient of the support team roles and support services at [facility] Provided [place] contact information and encouraged patient to call with any questions or concerns Referred patient to financial navigator and Provided patient with information about CSW role in patient care and resources.   Follow Up Plan: Patient will contact CSW with any support or resource needs and CSW will follow-up with patient by phone  Patient verbalizes understanding of plan: Yes    Richard Wiggins , LCSW

## 2021-08-31 NOTE — Op Note (Signed)
°      [facility]       Operative Note  Date: 08/31/2021  Preoperative diagnosis:  1. Lung cancer  Postoperative diagnosis:  Same as above  Procedures: #1. Ultrasound guidance for vascular access to the right internal jugular vein. #2. Fluoroscopic guidance for placement of catheter. #3. Placement of CT compatible Port-A-Cath, right internal jugular vein.  Surgeon: [name], MD.   Anesthesia: Local with moderate conscious sedation for approximately 22  minutes using 3 mg of Versed and 50 mcg of Fentanyl  Fluoroscopy time: less than 1 minute  Contrast used: 0  Estimated blood loss: 5 cc  Indication for the procedure:  The patient is a 71 y.o.male with lung cacner.  The patient needs a Port-A-Cath for durable venous access, chemotherapy, lab draws, and CT scans. We are asked to place this. Risks and benefits were discussed and informed consent was obtained.  Description of procedure: The patient was brought to the vascular and interventional radiology suite.  Moderate conscious sedation was administered throughout the procedure during a face to face encounter with the patient with my supervision of the RN administering medicines and monitoring the patient's vital signs, pulse oximetry, telemetry and mental status throughout from the start of the procedure until the patient was taken to the recovery room. The right neck chest and shoulder were sterilely prepped and draped, and a sterile surgical field was created. Ultrasound was used to help visualize a patent right internal jugular vein. This was then accessed under direct ultrasound guidance without difficulty with the Seldinger needle and a permanent image was recorded. A J-wire was placed. After skin nick and dilatation, the peel-away sheath was then placed over the wire. I then anesthetized an area under the clavicle approximately 1-2 fingerbreadths. A transverse incision was created and an inferior pocket was  created with electrocautery and blunt dissection. The port was then brought onto the field, placed into the pocket and secured to the chest wall with 2 Prolene sutures. The catheter was connected to the port and tunneled from the subclavicular incision to the access site. Fluoroscopic guidance was then used to cut the catheter to an appropriate length. The catheter was then placed through the peel-away sheath and the peel-away sheath was removed. The catheter tip was parked in excellent location under fluorocoscopic guidance in the cavoatrial junction. The pocket was then irrigated with antibiotic impregnated saline and the wound was closed with a running 3-0 Vicryl and a 4-0 Monocryl. The access incision was closed with a single 4-0 Monocryl. The Huber needle was used to withdraw blood and flush the port with heparinized saline. Dermabond was then placed as a dressing. The patient tolerated the procedure well and was taken to the recovery room in stable condition.   [name] 08/31/2021 9:13 AM   This note was created with Dragon Medical transcription system. Any errors in dictation are purely unintentional.

## 2021-09-01 ENCOUNTER — Encounter: Payer: Self-pay | Admitting: Licensed Clinical Social Worker

## 2021-09-01 ENCOUNTER — Ambulatory Visit: Payer: Medicare Other

## 2021-09-01 NOTE — Progress Notes (Signed)
[place] CSW Progress Note  Clinical Social Worker  sent referrals to [facility]  to assist with financial concerns.Adelene Amas , LCSW

## 2021-09-02 ENCOUNTER — Other Ambulatory Visit: Payer: Self-pay

## 2021-09-02 ENCOUNTER — Inpatient Hospital Stay: Payer: No Typology Code available for payment source

## 2021-09-02 ENCOUNTER — Encounter: Payer: Self-pay | Admitting: Oncology

## 2021-09-02 ENCOUNTER — Ambulatory Visit: Payer: Medicare Other

## 2021-09-02 ENCOUNTER — Inpatient Hospital Stay (HOSPITAL_BASED_OUTPATIENT_CLINIC_OR_DEPARTMENT_OTHER): Payer: No Typology Code available for payment source | Admitting: Oncology

## 2021-09-02 VITALS — BP 154/94 | HR 58 | Resp 16

## 2021-09-02 VITALS — BP 159/95 | HR 69 | Temp 96.9°F | Resp 16 | Ht 70.0 in | Wt 196.1 lb

## 2021-09-02 DIAGNOSIS — N183 Chronic kidney disease, stage 3 unspecified: Secondary | ICD-10-CM | POA: Diagnosis not present

## 2021-09-02 DIAGNOSIS — C3490 Malignant neoplasm of unspecified part of unspecified bronchus or lung: Secondary | ICD-10-CM

## 2021-09-02 DIAGNOSIS — D631 Anemia in chronic kidney disease: Secondary | ICD-10-CM | POA: Diagnosis not present

## 2021-09-02 DIAGNOSIS — Z5111 Encounter for antineoplastic chemotherapy: Secondary | ICD-10-CM

## 2021-09-02 LAB — CBC WITH DIFFERENTIAL/PLATELET
Abs Immature Granulocytes: 0.01 10*3/uL (ref 0.00–0.07)
Basophils Absolute: 0 10*3/uL (ref 0.0–0.1)
Basophils Relative: 1 %
Eosinophils Absolute: 0.2 10*3/uL (ref 0.0–0.5)
Eosinophils Relative: 4 %
HCT: 30 % — ABNORMAL LOW (ref 39.0–52.0)
Hemoglobin: 9.2 g/dL — ABNORMAL LOW (ref 13.0–17.0)
Immature Granulocytes: 0 %
Lymphocytes Relative: 16 %
Lymphs Abs: 0.7 10*3/uL (ref 0.7–4.0)
MCH: 28.8 pg (ref 26.0–34.0)
MCHC: 30.7 g/dL (ref 30.0–36.0)
MCV: 93.8 fL (ref 80.0–100.0)
Monocytes Absolute: 0.4 10*3/uL (ref 0.1–1.0)
Monocytes Relative: 8 %
Neutro Abs: 3.1 10*3/uL (ref 1.7–7.7)
Neutrophils Relative %: 71 %
Platelets: 132 10*3/uL — ABNORMAL LOW (ref 150–400)
RBC: 3.2 MIL/uL — ABNORMAL LOW (ref 4.22–5.81)
RDW: 15.4 % (ref 11.5–15.5)
WBC: 4.3 10*3/uL (ref 4.0–10.5)
nRBC: 0 % (ref 0.0–0.2)

## 2021-09-02 LAB — COMPREHENSIVE METABOLIC PANEL
ALT: 12 U/L (ref 0–44)
AST: 20 U/L (ref 15–41)
Albumin: 3.9 g/dL (ref 3.5–5.0)
Alkaline Phosphatase: 113 U/L (ref 38–126)
Anion gap: 7 (ref 5–15)
BUN: 28 mg/dL — ABNORMAL HIGH (ref 8–23)
CO2: 27 mmol/L (ref 22–32)
Calcium: 9.4 mg/dL (ref 8.9–10.3)
Chloride: 103 mmol/L (ref 98–111)
Creatinine, Ser: 1.31 mg/dL — ABNORMAL HIGH (ref 0.61–1.24)
GFR, Estimated: 58 mL/min — ABNORMAL LOW (ref >60)
Glucose, Bld: 97 mg/dL (ref 70–99)
Potassium: 5.3 mmol/L — ABNORMAL HIGH (ref 3.5–5.1)
Sodium: 137 mmol/L (ref 135–145)
Total Bilirubin: 0.3 mg/dL (ref 0.3–1.2)
Total Protein: 7.6 g/dL (ref 6.5–8.1)

## 2021-09-02 MED ORDER — HEPARIN SOD (PORK) LOCK FLUSH 100 UNIT/ML IV SOLN
500.0000 [IU] | Freq: Once | INTRAVENOUS | Status: AC | PRN
  Filled 2021-09-02: qty 5

## 2021-09-02 MED ORDER — SODIUM CHLORIDE 0.9 % IV SOLN
45.0000 mg/m2 | Freq: Once | INTRAVENOUS | Status: AC
  Administered 2021-09-02: 96 mg via INTRAVENOUS
  Filled 2021-09-02: qty 16

## 2021-09-02 MED ORDER — DIPHENHYDRAMINE HCL 50 MG/ML IJ SOLN
50.0000 mg | Freq: Once | INTRAMUSCULAR | Status: AC
  Administered 2021-09-02: 50 mg via INTRAVENOUS
  Filled 2021-09-02: qty 1

## 2021-09-02 MED ORDER — SODIUM CHLORIDE 0.9 % IV SOLN
10.0000 mg | Freq: Once | INTRAVENOUS | Status: AC
  Administered 2021-09-02: 10 mg via INTRAVENOUS
  Filled 2021-09-02: qty 10

## 2021-09-02 MED ORDER — FAMOTIDINE IN NACL 20-0.9 MG/50ML-% IV SOLN
20.0000 mg | Freq: Once | INTRAVENOUS | Status: AC
  Administered 2021-09-02: 20 mg via INTRAVENOUS
  Filled 2021-09-02: qty 50

## 2021-09-02 MED ORDER — PALONOSETRON HCL INJECTION 0.25 MG/5ML
0.2500 mg | Freq: Once | INTRAVENOUS | Status: AC
  Administered 2021-09-02: 0.25 mg via INTRAVENOUS
  Filled 2021-09-02: qty 5

## 2021-09-02 MED ORDER — SODIUM CHLORIDE 0.9 % IV SOLN
179.4000 mg | Freq: Once | INTRAVENOUS | Status: AC
  Administered 2021-09-02: 180 mg via INTRAVENOUS
  Filled 2021-09-02: qty 18

## 2021-09-02 MED ORDER — SODIUM CHLORIDE 0.9 % IV SOLN
Freq: Once | INTRAVENOUS | Status: AC
  Filled 2021-09-02: qty 250

## 2021-09-02 MED ORDER — LORAZEPAM 2 MG/ML IJ SOLN
0.5000 mg | Freq: Once | INTRAMUSCULAR | Status: AC
  Administered 2021-09-02: 0.5 mg via INTRAVENOUS
  Filled 2021-09-02: qty 1

## 2021-09-02 MED ORDER — SODIUM CHLORIDE 0.9% FLUSH
10.0000 mL | INTRAVENOUS | Status: DC | PRN
  Filled 2021-09-02: qty 10

## 2021-09-02 MED ORDER — HEPARIN SOD (PORK) LOCK FLUSH 100 UNIT/ML IV SOLN
INTRAVENOUS | Status: AC
  Administered 2021-09-02: 500 [IU]
  Filled 2021-09-02: qty 5

## 2021-09-02 MED ORDER — CARVEDILOL 25 MG PO TABS: 12.5000 mg | ORAL_TABLET | Freq: Two times a day (BID) | ORAL | 0 refills | Status: DC

## 2021-09-02 NOTE — Patient Instructions (Signed)
[facility] [place]  Discharge Instructions: Thank you for choosing [facility] to provide your oncology and hematology care.  If you have a lab appointment with the [facility], please go directly to the [facility] and check in at the registration area.  Wear comfortable clothing and clothing appropriate for easy access to any Portacath or PICC line.   We strive to give you quality time with your provider. You may need to reschedule your appointment if you arrive late (15 or more minutes).  Arriving late affects you and other patients whose appointments are after yours.  Also, if you miss three or more appointments without notifying the office, you may be dismissed from the clinic at the providers discretion.      For prescription refill requests, have your pharmacy contact our office and allow 72 hours for refills to be completed.    Today you received the following chemotherapy and/or immunotherapy agents - paclitaxel, carboplatin      To help prevent nausea and vomiting after your treatment, we encourage you to take your nausea medication as directed.  BELOW ARE SYMPTOMS THAT SHOULD BE REPORTED IMMEDIATELY: *FEVER GREATER THAN 100.4 F (38 C) OR HIGHER *CHILLS OR SWEATING *NAUSEA AND VOMITING THAT IS NOT CONTROLLED WITH YOUR NAUSEA MEDICATION *UNUSUAL SHORTNESS OF BREATH *UNUSUAL BRUISING OR BLEEDING *URINARY PROBLEMS (pain or burning when urinating, or frequent urination) *BOWEL PROBLEMS (unusual diarrhea, constipation, pain near the anus) TENDERNESS IN MOUTH AND THROAT WITH OR WITHOUT PRESENCE OF ULCERS (sore throat, sores in mouth, or a toothache) UNUSUAL RASH, SWELLING OR PAIN  UNUSUAL VAGINAL DISCHARGE OR ITCHING   Items with * indicate a potential emergency and should be followed up as soon as possible or go to the Emergency Department if any problems should occur.  Please show the CHEMOTHERAPY ALERT CARD or IMMUNOTHERAPY ALERT CARD  at check-in to the Emergency Department and triage nurse.  Should you have questions after your visit or need to cancel or reschedule your appointment, please contact [facility] [place] [facility] [place]  [phone] and follow the prompts.  Office hours are 8:00 a.m. to 4:30 p.m. Monday - Friday. Please note that voicemails left after 4:00 p.m. may not be returned until the following business day.  We are closed weekends and major holidays. You have access to a nurse at all times for urgent questions. Please call the main number to the clinic [phone] and follow the prompts.  For any non-urgent questions, you may also contact your provider using MyChart. We now offer e-Visits for anyone 48 and older to request care online for non-urgent symptoms. For details visit [email or web address].   Also download the MyChart app! Go to the app store, search "MyChart", open the app, select [facility], and log in with your MyChart username and password.  Due to Covid, a mask is required upon entering the hospital/clinic. If you do not have a mask, one will be given to you upon arrival. For doctor visits, patients may have 1 support person aged 77 or older with them. For treatment visits, patients cannot have anyone with them due to current Covid guidelines and our immunocompromised population.   Paclitaxel injection What is this medication? PACLITAXEL (PAK li TAX el) is a chemotherapy drug. It targets fast dividing cells, like cancer cells, and causes these cells to die. This medicine is used to treat ovarian cancer, breast cancer, lung cancer, Kaposi's sarcoma, and other cancers. This medicine may be used for other  purposes; ask your health care provider or pharmacist if you have questions. COMMON BRAND NAME(S): Onxol, Taxol What should I tell my care team before I take this medication? They need to know if you have any of these conditions: history of irregular heartbeat liver disease low  blood counts, like low white cell, platelet, or red cell counts lung or breathing disease, like asthma tingling of the fingers or toes, or other nerve disorder an unusual or allergic reaction to paclitaxel, alcohol, polyoxyethylated castor oil, other chemotherapy, other medicines, foods, dyes, or preservatives pregnant or trying to get pregnant breast-feeding How should I use this medication? This drug is given as an infusion into a vein. It is administered in a hospital or clinic by a specially trained health care professional. Talk to your pediatrician regarding the use of this medicine in children. Special care may be needed. Overdosage: If you think you have taken too much of this medicine contact a poison control center or emergency room at once. NOTE: This medicine is only for you. Do not share this medicine with others. What if I miss a dose? It is important not to miss your dose. Call your doctor or health care professional if you are unable to keep an appointment. What may interact with this medication? Do not take this medicine with any of the following medications: live virus vaccines This medicine may also interact with the following medications: antiviral medicines for hepatitis, HIV or AIDS certain antibiotics like erythromycin and clarithromycin certain medicines for fungal infections like ketoconazole and itraconazole certain medicines for seizures like carbamazepine, phenobarbital, phenytoin gemfibrozil nefazodone rifampin St. John's wort This list may not describe all possible interactions. Give your health care provider a list of all the medicines, herbs, non-prescription drugs, or dietary supplements you use. Also tell them if you smoke, drink alcohol, or use illegal drugs. Some items may interact with your medicine. What should I watch for while using this medication? Your condition will be monitored carefully while you are receiving this medicine. You will need  important blood work done while you are taking this medicine. This medicine can cause serious allergic reactions. To reduce your risk you will need to take other medicine(s) before treatment with this medicine. If you experience allergic reactions like skin rash, itching or hives, swelling of the face, lips, or tongue, tell your doctor or health care professional right away. In some cases, you may be given additional medicines to help with side effects. Follow all directions for their use. This drug may make you feel generally unwell. This is not uncommon, as chemotherapy can affect healthy cells as well as cancer cells. Report any side effects. Continue your course of treatment even though you feel ill unless your doctor tells you to stop. Call your doctor or health care professional for advice if you get a fever, chills or sore throat, or other symptoms of a cold or flu. Do not treat yourself. This drug decreases your body's ability to fight infections. Try to avoid being around people who are sick. This medicine may increase your risk to bruise or bleed. Call your doctor or health care professional if you notice any unusual bleeding. Be careful brushing and flossing your teeth or using a toothpick because you may get an infection or bleed more easily. If you have any dental work done, tell your dentist you are receiving this medicine. Avoid taking products that contain aspirin, acetaminophen, ibuprofen, naproxen, or ketoprofen unless instructed by your doctor. These medicines may  hide a fever. Do not become pregnant while taking this medicine. Women should inform their doctor if they wish to become pregnant or think they might be pregnant. There is a potential for serious side effects to an unborn child. Talk to your health care professional or pharmacist for more information. Do not breast-feed an infant while taking this medicine. Men are advised not to father a child while receiving this  medicine. This product may contain alcohol. Ask your pharmacist or healthcare provider if this medicine contains alcohol. Be sure to tell all healthcare providers you are taking this medicine. Certain medicines, like metronidazole and disulfiram, can cause an unpleasant reaction when taken with alcohol. The reaction includes flushing, headache, nausea, vomiting, sweating, and increased thirst. The reaction can last from 30 minutes to several hours. What side effects may I notice from receiving this medication? Side effects that you should report to your doctor or health care professional as soon as possible: allergic reactions like skin rash, itching or hives, swelling of the face, lips, or tongue breathing problems changes in vision fast, irregular heartbeat high or low blood pressure mouth sores pain, tingling, numbness in the hands or feet signs of decreased platelets or bleeding - bruising, pinpoint red spots on the skin, black, tarry stools, blood in the urine signs of decreased red blood cells - unusually weak or tired, feeling faint or lightheaded, falls signs of infection - fever or chills, cough, sore throat, pain or difficulty passing urine signs and symptoms of liver injury like dark yellow or brown urine; general ill feeling or flu-like symptoms; light-colored stools; loss of appetite; nausea; right upper belly pain; unusually weak or tired; yellowing of the eyes or skin swelling of the ankles, feet, hands unusually slow heartbeat Side effects that usually do not require medical attention (report to your doctor or health care professional if they continue or are bothersome): diarrhea hair loss loss of appetite muscle or joint pain nausea, vomiting pain, redness, or irritation at site where injected tiredness This list may not describe all possible side effects. Call your doctor for medical advice about side effects. You may report side effects to FDA at 1-800-FDA-1088. Where  should I keep my medication? This drug is given in a hospital or clinic and will not be stored at home. NOTE: This sheet is a summary. It may not cover all possible information. If you have questions about this medicine, talk to your doctor, pharmacist, or health care provider.  2022 Elsevier/Gold Standard (2021-03-17 00:00:00)  Carboplatin injection What is this medication? CARBOPLATIN (KAR boe pla tin) is a chemotherapy drug. It targets fast dividing cells, like cancer cells, and causes these cells to die. This medicine is used to treat ovarian cancer and many other cancers. This medicine may be used for other purposes; ask your health care provider or pharmacist if you have questions. COMMON BRAND NAME(S): Paraplatin What should I tell my care team before I take this medication? They need to know if you have any of these conditions: blood disorders hearing problems kidney disease recent or ongoing radiation therapy an unusual or allergic reaction to carboplatin, cisplatin, other chemotherapy, other medicines, foods, dyes, or preservatives pregnant or trying to get pregnant breast-feeding How should I use this medication? This drug is usually given as an infusion into a vein. It is administered in a hospital or clinic by a specially trained health care professional. Talk to your pediatrician regarding the use of this medicine in children. Special care may be  needed. Overdosage: If you think you have taken too much of this medicine contact a poison control center or emergency room at once. NOTE: This medicine is only for you. Do not share this medicine with others. What if I miss a dose? It is important not to miss a dose. Call your doctor or health care professional if you are unable to keep an appointment. What may interact with this medication? medicines for seizures medicines to increase blood counts like filgrastim, pegfilgrastim, sargramostim some antibiotics like amikacin,  gentamicin, neomycin, streptomycin, tobramycin vaccines Talk to your doctor or health care professional before taking any of these medicines: acetaminophen aspirin ibuprofen ketoprofen naproxen This list may not describe all possible interactions. Give your health care provider a list of all the medicines, herbs, non-prescription drugs, or dietary supplements you use. Also tell them if you smoke, drink alcohol, or use illegal drugs. Some items may interact with your medicine. What should I watch for while using this medication? Your condition will be monitored carefully while you are receiving this medicine. You will need important blood work done while you are taking this medicine. This drug may make you feel generally unwell. This is not uncommon, as chemotherapy can affect healthy cells as well as cancer cells. Report any side effects. Continue your course of treatment even though you feel ill unless your doctor tells you to stop. In some cases, you may be given additional medicines to help with side effects. Follow all directions for their use. Call your doctor or health care professional for advice if you get a fever, chills or sore throat, or other symptoms of a cold or flu. Do not treat yourself. This drug decreases your body's ability to fight infections. Try to avoid being around people who are sick. This medicine may increase your risk to bruise or bleed. Call your doctor or health care professional if you notice any unusual bleeding. Be careful brushing and flossing your teeth or using a toothpick because you may get an infection or bleed more easily. If you have any dental work done, tell your dentist you are receiving this medicine. Avoid taking products that contain aspirin, acetaminophen, ibuprofen, naproxen, or ketoprofen unless instructed by your doctor. These medicines may hide a fever. Do not become pregnant while taking this medicine. Women should inform their doctor if they wish  to become pregnant or think they might be pregnant. There is a potential for serious side effects to an unborn child. Talk to your health care professional or pharmacist for more information. Do not breast-feed an infant while taking this medicine. What side effects may I notice from receiving this medication? Side effects that you should report to your doctor or health care professional as soon as possible: allergic reactions like skin rash, itching or hives, swelling of the face, lips, or tongue signs of infection - fever or chills, cough, sore throat, pain or difficulty passing urine signs of decreased platelets or bleeding - bruising, pinpoint red spots on the skin, black, tarry stools, nosebleeds signs of decreased red blood cells - unusually weak or tired, fainting spells, lightheadedness breathing problems changes in hearing changes in vision chest pain high blood pressure low blood counts - This drug may decrease the number of white blood cells, red blood cells and platelets. You may be at increased risk for infections and bleeding. nausea and vomiting pain, swelling, redness or irritation at the injection site pain, tingling, numbness in the hands or feet problems with balance, talking, walking  trouble passing urine or change in the amount of urine Side effects that usually do not require medical attention (report to your doctor or health care professional if they continue or are bothersome): hair loss loss of appetite metallic taste in the mouth or changes in taste This list may not describe all possible side effects. Call your doctor for medical advice about side effects. You may report side effects to FDA at 1-800-FDA-1088. Where should I keep my medication? This drug is given in a hospital or clinic and will not be stored at home. NOTE: This sheet is a summary. It may not cover all possible information. If you have questions about this medicine, talk to your doctor, pharmacist,  or health care provider.  2022 Elsevier/Gold Standard (2007-12-06 00:00:00)

## 2021-09-02 NOTE — Progress Notes (Signed)
Pt anxious today about treatment, only eating one meal. I have put in a nutrition eval.

## 2021-09-02 NOTE — Progress Notes (Signed)
Hematology/Oncology Consult note [facility]  [phone] [phone]  Patient Care Team: Pcp, No as PCP - General Kate Sable, MD as PCP - Cardiology (Cardiology) Telford Nab, RN as Oncology Nurse Navigator Noreene Filbert, MD as Referring Physician (Radiation Oncology) Sindy Guadeloupe, MD as Consulting Physician (Oncology)   Name of the patient: Richard Wiggins  [id number]  [birth year]   Date of visit: 09/02/21  Diagnosis-history of stage I lung cancer s/p SBRT now with hilar recurrence  Chief complaint/ Reason for visit-on treatment assessment prior to cycle 1 of weekly CarboTaxol chemotherapy  Heme/Onc history: Patient is a 71 year old male who was a former smoker and quit in 2018.  He went to the ER in July 2021 for possible strokelike symptoms and was found to have a left upper lobe lung nodule which was followed up with a CT chest which showed a 1.2 x 1.1 cm left upper lobe spiculated nodule.  This was followed by a PET scan which showed that nodule was hypermetabolic with an SUV of 4.12 patient was also incidentally noted to have a 4.3 cm abdominal aortic aneurysm.  He follows up with Dr. Lucky Cowboy for his aneurysm.  He was seen by Dr. Patsey Berthold and underwent ENB.  Biopsy was nondiagnostic.  However given that the nodule was hypermetabolic it was concerning for malignancy and recommendation per Dr. Patsey Berthold was empiric radiation. Patient underwent SBRT to his left upper lobe by Dr. Baruch Gouty   Patient also has a positive fit test.  Colonoscopy in January 2022 showed no evidence of colon cancer.  There wereCouple of polyps which were resected and were consistent with tubular adenoma   Patient noted to have enlarging left hilar lymph node which was PET positiveIn January 2023.  He had EBUS guided bronchoscopy with Dr. Patsey Berthold which was consistent with non-small cell lung cancer    Interval history-patient is anxious prior to start of  chemotherapy today.  He is otherwise doing well and other than fatigue he denies other complaints at this time  ECOG PS- 1 Pain scale- 0   Review of systems- Review of Systems  Constitutional:  Positive for malaise/fatigue. Negative for chills, fever and weight loss.  HENT:  Negative for congestion, ear discharge and nosebleeds.   Eyes:  Negative for blurred vision.  Respiratory:  Negative for cough, hemoptysis, sputum production, shortness of breath and wheezing.   Cardiovascular:  Negative for chest pain, palpitations, orthopnea and claudication.  Gastrointestinal:  Negative for abdominal pain, blood in stool, constipation, diarrhea, heartburn, melena, nausea and vomiting.  Genitourinary:  Negative for dysuria, flank pain, frequency, hematuria and urgency.  Musculoskeletal:  Negative for back pain, joint pain and myalgias.  Skin:  Negative for rash.  Neurological:  Negative for dizziness, tingling, focal weakness, seizures, weakness and headaches.  Endo/Heme/Allergies:  Does not bruise/bleed easily.  Psychiatric/Behavioral:  Negative for depression and suicidal ideas. The patient is nervous/anxious. The patient does not have insomnia.      Allergies  Allergen Reactions   Tape Rash    plastic   Wound Dressing Adhesive Itching and Rash    Gets stuck to skin, makes wound spread     Past Medical History:  Diagnosis Date   AAA (abdominal aortic aneurysm) 2020   a. 2020 U/S: 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 -plan medical managmment with tight bp control; b. 09/2020 Abd U/S: 4.1cm.   Anxiety    Aortic atherosclerosis (HCC)    Benign essential HTN 02/10/2020  BPH (benign prostatic hyperplasia) 02/09/2020   Brain aneurysm    Cancer of upper lobe of left lung ([place]) 04/2020   a.) ENB/EBUS performed; Bx non-diagnostic. b.) presumed stage I NSCLC in the LUL; underwent SBRT (60 Gy over 5 fractions)   Carotid arterial disease ([place])    a. 05/2017 s/p R carotid endarterectomy following CVA.    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 2018 Echo: EF 30-35%; b. 02/2021 Echo: EF 55-60%, nl RV fxn. Triv MR. Asc Ao 84mm.   CKD (chronic kidney disease), stage III (HCC)    Claudication ([place])    a. 09/2020 ABIs: R 0.91. L 0.99.   COPD (chronic obstructive pulmonary disease) (HCC)    Coronary artery disease    a. 06/2017 PCI ([place]): s/p PCI to the RCA. LAD 50, LCX 20.   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 02/2021 Asc Ao 52mm.   Emphysema of lung (HCC)    HLD (hyperlipidemia)    IDA (iron deficiency anemia) 02/09/2020   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   Ischemic cardiomyopathy    Long term current use of anticoagulant    a.) apixaban   Marijuana use, continuous 02/10/2020   Osteoporosis    PAF (paroxysmal atrial fibrillation) ([place])    a. 05/2017 Dx in setting of CVA. CHA2DS2VASc = 6-->Eliquis.   Pre-diabetes    Pulmonary emboli ([place]) 06/07/2017   Right lower lobe pulmonary embolism small segmental, multifocal multifocal pneumonia, mediastinal lymphadenopathy, moderate centrilobular emphysema   Seizures ([place])    Sigmoid diverticulosis    a.) CT 01/27/2021: severe   Stroke [facility])    a. 05/2017 - hospitalized in [place]->prolonged hospitalization in setting of R CEA, PE, and finding of RCA dzs on cath; b. Residual left sided weakness to arm and leg.     Past Surgical History:  Procedure Laterality Date   brain aneurysm with clip     COLONOSCOPY WITH PROPOFOL N/A 07/23/2020   Procedure: COLONOSCOPY WITH PROPOFOL;  Surgeon: Lin Landsman, MD;  Location: [facility] ENDOSCOPY;  Service: Gastroenterology;  Laterality: N/A;   CORONARY ANGIOPLASTY WITH STENT PLACEMENT  07/08/2017   PORTA CATH INSERTION N/A 08/31/2021   Procedure: PORTA CATH INSERTION;  Surgeon: Algernon Huxley, MD;  Location: [place] CV LAB;  Service: Cardiovascular;  Laterality: N/A;   TOTAL HIP ARTHROPLASTY Right    VIDEO BRONCHOSCOPY WITH  ENDOBRONCHIAL NAVIGATION N/A 04/23/2020   Procedure: VIDEO BRONCHOSCOPY WITH ENDOBRONCHIAL NAVIGATION;  Surgeon: Tyler Pita, MD;  Location: [facility] ORS;  Service: Pulmonary;  Laterality: N/A;   VIDEO BRONCHOSCOPY WITH ENDOBRONCHIAL ULTRASOUND Left 08/12/2021   Procedure: VIDEO BRONCHOSCOPY WITH ENDOBRONCHIAL ULTRASOUND;  Surgeon: Tyler Pita, MD;  Location: [facility] ORS;  Service: Cardiopulmonary;  Laterality: Left;    Social History   Socioeconomic History   Marital status: Married    Spouse name: Butch Penny   Number of children: Not on file   Years of education: Not on file   Highest education level: High school graduate  Occupational History   Occupation: Retired  Tobacco Use   Smoking status: Former    Packs/day: 2.00    Years: 53.00    Pack years: 106.00    Types: Cigarettes    Quit date: 05/28/2017    Years since quitting: 4.2   Smokeless tobacco: Never   Tobacco comments:    Quit in 2018  Vaping Use   Vaping Use: Never used  Substance and Sexual Activity   Alcohol use: Yes    Comment: socially drink cocktail   Drug use: Not Currently    Types: Marijuana    Comment: last smoke x1 month ago   Sexual activity: Not Currently  Other Topics Concern   Not on file  Social History Narrative   Lives with wife. 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 Children x2 children and grandchildren grown.    Social Determinants of Health   Financial Resource Strain: Low Risk    Difficulty of Paying Living Expenses: Not hard at all  Food Insecurity: No Food Insecurity   Worried About Charity fundraiser in the Last Year: Never true   [place] in the Last Year: Never true  Transportation Needs: No Transportation Needs   Lack of Transportation (Medical): No   Lack of Transportation (Non-Medical): No  Physical Activity: Inactive   Days of Exercise per Week: 0 days   Minutes of Exercise per Session: 0 min  Stress: Stress Concern Present   Feeling of Stress : Rather  much  Social Connections: Moderately Isolated   Frequency of Communication with Friends and Family: Never   Frequency of Social Gatherings with Friends and Family: Never   Attends Religious Services: Never   Marine scientist or Organizations: Yes   Attends Archivist Meetings: Never   Marital Status: Married  Human resources officer Violence: Not At Risk   Fear of Current or Ex-Partner: No   Emotionally Abused: No   Physically Abused: No   Sexually Abused: No    Family History  Problem Relation Age of Onset   Diabetes Mother    Heart disease Mother    Stroke Father    Heart disease Father    Heart attack Father    Alcohol abuse Father    Heart disease Sister    Heart attack Sister    Heart disease Maternal Grandmother    Heart attack Maternal Grandmother    Heart attack Paternal Grandmother    Diabetes Brother    Heart disease Brother    Heart attack Brother      Current Outpatient Medications:    albuterol (PROVENTIL) (2.5 MG/3ML) 0.083% nebulizer solution, Take 3 mLs (2.5 mg total) by nebulization every 6 (six) hours as needed for wheezing or shortness of breath., Disp: 75 mL, Rfl: 6   albuterol (VENTOLIN HFA) 108 (90 Base) MCG/ACT inhaler, Inhale 2 puffs into the lungs every 6 (six) hours as needed for wheezing or shortness of breath., Disp: 8 g, Rfl: 2   apixaban (ELIQUIS) 5 MG TABS tablet, TAKE 1 TABLET(5 MG) BY MOUTH IN THE MORNING AND AT BEDTIME, Disp: 60 tablet, Rfl: 3   aspirin EC 81 MG tablet, Take 81 mg by mouth daily. Swallow whole., Disp: , Rfl:    atorvastatin (LIPITOR) 40 MG tablet, TAKE 1 TABLET(40 MG) BY MOUTH AT BEDTIME, Disp: 90 tablet, Rfl: 3   Budeson-Glycopyrrol-Formoterol (BREZTRI AEROSPHERE) 160-9-4.8 MCG/ACT AERO, Inhale 2 puffs into the lungs in the morning and at bedtime., Disp: 10.7 g, Rfl: 11   carvedilol (COREG) 25 MG tablet, Take 0.5 tablets (12.5 mg total) by mouth in the morning and at bedtime., Disp: 90 tablet, Rfl: 0    dexamethasone (DECADRON) 4 MG tablet, Take 2 tablets (8 mg total) by mouth daily. Start the day after chemotherapy for 2 days., Disp: 30 tablet, Rfl: 1   furosemide (LASIX) 40 MG tablet, Take 1 tablet (40 mg total) by  mouth as needed., Disp: 90 tablet, Rfl: 2   lamoTRIgine (LAMICTAL) 100 MG tablet, Take 100 mg by mouth 2 (two) times daily., Disp: , Rfl:    Lidocaine HCl-Benzyl Alcohol (SALONPAS LIDOCAINE PLUS EX), Place 1 patch onto the skin daily as needed (pain.)., Disp: , Rfl:    lidocaine-prilocaine (EMLA) cream, Apply to affected area once, Disp: 30 g, Rfl: 3   Multiple Vitamin (MULTI-VITAMIN) tablet, Take 1 tablet by mouth daily., Disp: , Rfl:    sacubitril-valsartan (ENTRESTO) 49-51 MG, Take 0.5 tablets by mouth in the morning and at bedtime., Disp: , Rfl:    spironolactone (ALDACTONE) 25 MG tablet, Take 0.5 tablets (12.5 mg total) by mouth at bedtime., Disp: 45 tablet, Rfl: 0   tamsulosin (FLOMAX) 0.4 MG CAPS capsule, Take 1 capsule (0.4 mg total) by mouth daily., Disp: 90 capsule, Rfl: 0   LORazepam (ATIVAN) 0.5 MG tablet, Take 1 tablet (0.5 mg total) by mouth every 6 (six) hours as needed (Nausea or vomiting). (Patient not taking: Reported on 09/02/2021), Disp: 30 tablet, Rfl: 0   ondansetron (ZOFRAN) 8 MG tablet, Take 1 tablet (8 mg total) by mouth 2 (two) times daily as needed for refractory nausea / vomiting. Start on day 3 after chemo. (Patient not taking: Reported on 09/02/2021), Disp: 30 tablet, Rfl: 1   prochlorperazine (COMPAZINE) 10 MG tablet, Take 1 tablet (10 mg total) by mouth every 6 (six) hours as needed (Nausea or vomiting). (Patient not taking: Reported on 09/02/2021), Disp: 30 tablet, Rfl: 1 No current facility-administered medications for this visit.  Facility-Administered Medications Ordered in Other Visits:    CARBOplatin (PARAPLATIN) 180 mg in sodium chloride 0.9 % 100 mL chemo infusion, 180 mg, Intravenous, Once, Sindy Guadeloupe, MD   heparin lock flush 100 UNIT/ML  injection, , , ,    heparin lock flush 100 unit/mL, 500 Units, Intracatheter, Once PRN, Sindy Guadeloupe, MD   sodium chloride flush (NS) 0.9 % injection 10 mL, 10 mL, Intracatheter, PRN, Sindy Guadeloupe, MD  Physical exam:  Vitals:   09/02/21 0913  BP: (!) 159/95  Pulse: 69  Resp: 16  Temp: (!) 96.9 F (36.1 C)  TempSrc: Tympanic  Weight: 196 lb 1.6 oz (89 kg)  Height: 5\' 10"  (1.778 m)   Physical Exam Constitutional:      General: He is not in acute distress. Cardiovascular:     Rate and Rhythm: Normal rate and regular rhythm.     Heart sounds: Normal heart sounds.  Pulmonary:     Effort: Pulmonary effort is normal.     Breath sounds: Normal breath sounds.  Abdominal:     General: Bowel sounds are normal.     Palpations: Abdomen is soft.  Skin:    General: Skin is warm and dry.  Neurological:     Mental Status: He is alert and oriented to person, place, and time.     CMP Latest Ref Rng & Units 09/02/2021  Glucose 70 - 99 mg/dL 97  BUN 8 - 23 mg/dL 28(H)  Creatinine 0.61 - 1.24 mg/dL 1.31(H)  Sodium 135 - 145 mmol/L 137  Potassium 3.5 - 5.1 mmol/L 5.3(H)  Chloride 98 - 111 mmol/L 103  CO2 22 - 32 mmol/L 27  Calcium 8.9 - 10.3 mg/dL 9.4  Total Protein 6.5 - 8.1 g/dL 7.6  Total Bilirubin 0.3 - 1.2 mg/dL 0.3  Alkaline Phos 38 - 126 U/L 113  AST 15 - 41 U/L 20  ALT 0 - 44 U/L  12   CBC Latest Ref Rng & Units 09/02/2021  WBC 4.0 - 10.5 K/uL 4.3  Hemoglobin 13.0 - 17.0 g/dL 9.2(L)  Hematocrit 39.0 - 52.0 % 30.0(L)  Platelets 150 - 400 K/uL 132(L)    No images are attached to the encounter.  PERIPHERAL VASCULAR CATHETERIZATION  Result Date: 08/31/2021 See surgical note for result.  DG Chest Port 1 View  Result Date: 08/12/2021 CLINICAL DATA:  Status post left-sided bronchoscopy EXAM: PORTABLE CHEST 1 VIEW COMPARISON:  Chest x-ray 04/23/2020, CT chest CT chest 07/08/2021, PET-CT 07/21/2021 FINDINGS: Left infrahilar linear airspace disease likely reflecting  atelectasis. Stable subtle scarring in the superior segment of the left lower lobe. No focal consolidation. No pleural effusion or pneumothorax. Heart and mediastinal contours are unremarkable. No acute osseous abnormality. IMPRESSION: 1. No acute cardiopulmonary disease. Electronically Signed   By: Kathreen Devoid M.D.   On: 08/12/2021 14:52     Assessment and plan- Patient is a 71 y.o. male with history of stage I left upper lobe lung cancer and 2021 s/p SBRT now with nodal recurrence.  He is here for on treatment assessment prior to cycle 1 of weekly CarboTaxol chemotherapy  Counts okay to proceed with cycle 1 of weekly CarboTaxol chemotherapy today.  He does have some baseline CKD and therefore I will plan to give him weekly IV fluids 2 days postchemotherapy as well.  He will directly proceed for cycle 2 of weekly CarboTaxol chemotherapy in 1 week and I will see him back in 2 weeks for cycle 3.  Patient is only receiving 2 weeks of IMRT to his hilar lymph nodes instead of her regular 7-week course due to cost issues.  After 3 cycles of weekly CarboTaxol chemotherapy I will plan to give him 2 additional cycles of CarboTaxol chemotherapy every 3 weeks followed by repeat scans.  Discussed risks and benefits of chemotherapy including all but not limited to nausea, vomiting, low blood counts, risk of infections and hospitalization, risk of peripheral neuropathy as well as infusion reaction.  Treatment is being given with a curative intent.  Patient understands and agrees to proceed as planned  Anxiety: I have asked him to take a dose of Ativan before he comes for his future chemotherapy treatments which has been prescribed for his nausea   Suspect normocytic anemia secondary to chronic kidney disease as well but due to presence of a curable malignancy I am not starting him on Retacrit for this.  Continue to monitor    Visit Diagnosis 1. Recurrent non-small cell lung cancer ([place])   2. Encounter for  antineoplastic chemotherapy   3. Anemia of chronic kidney failure, stage 3 (moderate) (HCC)      Dr. Randa Evens, MD, MPH [facility] [phone] 09/02/2021 12:33 PM

## 2021-09-03 ENCOUNTER — Ambulatory Visit
Admission: RE | Admit: 2021-09-03 | Discharge: 2021-09-03 | Disposition: A | Discharge status: Home / Self Care | Payer: Medicare Other | Source: Ambulatory Visit | Attending: Radiation Oncology | Admitting: Radiation Oncology

## 2021-09-03 ENCOUNTER — Ambulatory Visit: Payer: Medicare Other

## 2021-09-03 DIAGNOSIS — C3412 Malignant neoplasm of upper lobe, left bronchus or lung: Secondary | ICD-10-CM | POA: Diagnosis not present

## 2021-09-03 DIAGNOSIS — Z87891 Personal history of nicotine dependence: Secondary | ICD-10-CM | POA: Diagnosis not present

## 2021-09-03 DIAGNOSIS — Z510 Encounter for antineoplastic radiation therapy: Secondary | ICD-10-CM | POA: Diagnosis not present

## 2021-09-04 ENCOUNTER — Ambulatory Visit: Payer: Medicare Other

## 2021-09-04 ENCOUNTER — Other Ambulatory Visit: Payer: Self-pay

## 2021-09-04 ENCOUNTER — Ambulatory Visit
Admission: RE | Admit: 2021-09-04 | Discharge: 2021-09-04 | Disposition: A | Discharge status: Home / Self Care | Payer: Medicare Other | Source: Ambulatory Visit | Attending: Radiation Oncology | Admitting: Radiation Oncology

## 2021-09-04 ENCOUNTER — Inpatient Hospital Stay: Payer: No Typology Code available for payment source

## 2021-09-04 VITALS — BP 145/68 | HR 57 | Temp 96.6°F | Resp 16

## 2021-09-04 DIAGNOSIS — E860 Dehydration: Secondary | ICD-10-CM

## 2021-09-04 DIAGNOSIS — Z510 Encounter for antineoplastic radiation therapy: Secondary | ICD-10-CM | POA: Diagnosis not present

## 2021-09-04 DIAGNOSIS — C3412 Malignant neoplasm of upper lobe, left bronchus or lung: Secondary | ICD-10-CM | POA: Diagnosis not present

## 2021-09-04 DIAGNOSIS — Z87891 Personal history of nicotine dependence: Secondary | ICD-10-CM | POA: Diagnosis not present

## 2021-09-04 MED ORDER — HEPARIN SOD (PORK) LOCK FLUSH 100 UNIT/ML IV SOLN
500.0000 [IU] | Freq: Once | INTRAVENOUS | Status: AC
  Administered 2021-09-04: 500 [IU] via INTRAVENOUS
  Filled 2021-09-04: qty 5

## 2021-09-04 MED ORDER — SODIUM CHLORIDE 0.9% FLUSH
10.0000 mL | Freq: Once | INTRAVENOUS | Status: AC
  Administered 2021-09-04: 10 mL via INTRAVENOUS
  Filled 2021-09-04: qty 10

## 2021-09-04 MED ORDER — SODIUM CHLORIDE 0.9 % IV SOLN
Freq: Once | INTRAVENOUS | Status: AC
  Filled 2021-09-04: qty 250

## 2021-09-07 ENCOUNTER — Ambulatory Visit: Payer: Medicare Other

## 2021-09-07 ENCOUNTER — Ambulatory Visit
Admission: RE | Admit: 2021-09-07 | Discharge: 2021-09-07 | Disposition: A | Discharge status: Home / Self Care | Payer: Medicare Other | Source: Ambulatory Visit | Attending: Radiation Oncology | Admitting: Radiation Oncology

## 2021-09-07 DIAGNOSIS — Z87891 Personal history of nicotine dependence: Secondary | ICD-10-CM | POA: Diagnosis not present

## 2021-09-07 DIAGNOSIS — Z510 Encounter for antineoplastic radiation therapy: Secondary | ICD-10-CM | POA: Diagnosis not present

## 2021-09-07 DIAGNOSIS — C3412 Malignant neoplasm of upper lobe, left bronchus or lung: Secondary | ICD-10-CM | POA: Diagnosis not present

## 2021-09-08 ENCOUNTER — Ambulatory Visit
Admission: RE | Admit: 2021-09-08 | Discharge: 2021-09-08 | Disposition: A | Discharge status: Home / Self Care | Payer: Medicare Other | Source: Ambulatory Visit | Attending: Radiation Oncology | Admitting: Radiation Oncology

## 2021-09-08 ENCOUNTER — Encounter: Payer: Self-pay | Admitting: Oncology

## 2021-09-08 ENCOUNTER — Ambulatory Visit: Payer: Medicare Other

## 2021-09-08 DIAGNOSIS — C3412 Malignant neoplasm of upper lobe, left bronchus or lung: Secondary | ICD-10-CM | POA: Diagnosis not present

## 2021-09-08 DIAGNOSIS — Z510 Encounter for antineoplastic radiation therapy: Secondary | ICD-10-CM | POA: Diagnosis not present

## 2021-09-08 DIAGNOSIS — Z87891 Personal history of nicotine dependence: Secondary | ICD-10-CM | POA: Diagnosis not present

## 2021-09-09 ENCOUNTER — Other Ambulatory Visit (INDEPENDENT_AMBULATORY_CARE_PROVIDER_SITE_OTHER): Payer: Medicare Other

## 2021-09-09 ENCOUNTER — Inpatient Hospital Stay: Payer: No Typology Code available for payment source

## 2021-09-09 ENCOUNTER — Ambulatory Visit: Payer: No Typology Code available for payment source

## 2021-09-09 ENCOUNTER — Encounter: Payer: Self-pay | Admitting: Oncology

## 2021-09-09 ENCOUNTER — Other Ambulatory Visit: Payer: Self-pay

## 2021-09-09 ENCOUNTER — Encounter (INDEPENDENT_AMBULATORY_CARE_PROVIDER_SITE_OTHER): Payer: Medicare Other

## 2021-09-09 ENCOUNTER — Inpatient Hospital Stay: Payer: No Typology Code available for payment source | Attending: Oncology

## 2021-09-09 ENCOUNTER — Ambulatory Visit (INDEPENDENT_AMBULATORY_CARE_PROVIDER_SITE_OTHER): Payer: Medicare Other | Admitting: Nurse Practitioner

## 2021-09-09 ENCOUNTER — Ambulatory Visit
Admission: RE | Admit: 2021-09-09 | Discharge: 2021-09-09 | Disposition: A | Discharge status: Home / Self Care | Payer: No Typology Code available for payment source | Source: Ambulatory Visit | Attending: Radiation Oncology | Admitting: Radiation Oncology

## 2021-09-09 VITALS — BP 152/73 | HR 59 | Temp 96.0°F | Resp 20 | Wt 195.4 lb

## 2021-09-09 DIAGNOSIS — Z510 Encounter for antineoplastic radiation therapy: Secondary | ICD-10-CM | POA: Insufficient documentation

## 2021-09-09 DIAGNOSIS — C3412 Malignant neoplasm of upper lobe, left bronchus or lung: Secondary | ICD-10-CM | POA: Insufficient documentation

## 2021-09-09 DIAGNOSIS — Z452 Encounter for adjustment and management of vascular access device: Secondary | ICD-10-CM | POA: Insufficient documentation

## 2021-09-09 DIAGNOSIS — C3490 Malignant neoplasm of unspecified part of unspecified bronchus or lung: Secondary | ICD-10-CM

## 2021-09-09 DIAGNOSIS — Z5111 Encounter for antineoplastic chemotherapy: Secondary | ICD-10-CM | POA: Insufficient documentation

## 2021-09-09 LAB — CBC WITH DIFFERENTIAL/PLATELET
Abs Immature Granulocytes: 0.02 10*3/uL (ref 0.00–0.07)
Basophils Absolute: 0 10*3/uL (ref 0.0–0.1)
Basophils Relative: 0 %
Eosinophils Absolute: 0.1 10*3/uL (ref 0.0–0.5)
Eosinophils Relative: 3 %
HCT: 28.9 % — ABNORMAL LOW (ref 39.0–52.0)
Hemoglobin: 8.9 g/dL — ABNORMAL LOW (ref 13.0–17.0)
Immature Granulocytes: 1 %
Lymphocytes Relative: 18 %
Lymphs Abs: 0.6 10*3/uL — ABNORMAL LOW (ref 0.7–4.0)
MCH: 28.3 pg (ref 26.0–34.0)
MCHC: 30.8 g/dL (ref 30.0–36.0)
MCV: 92 fL (ref 80.0–100.0)
Monocytes Absolute: 0.2 10*3/uL (ref 0.1–1.0)
Monocytes Relative: 5 %
Neutro Abs: 2.4 10*3/uL (ref 1.7–7.7)
Neutrophils Relative %: 73 %
Platelets: 148 10*3/uL — ABNORMAL LOW (ref 150–400)
RBC: 3.14 MIL/uL — ABNORMAL LOW (ref 4.22–5.81)
RDW: 15.5 % (ref 11.5–15.5)
WBC: 3.3 10*3/uL — ABNORMAL LOW (ref 4.0–10.5)
nRBC: 0 % (ref 0.0–0.2)

## 2021-09-09 LAB — COMPREHENSIVE METABOLIC PANEL
ALT: 17 U/L (ref 0–44)
AST: 22 U/L (ref 15–41)
Albumin: 3.5 g/dL (ref 3.5–5.0)
Alkaline Phosphatase: 102 U/L (ref 38–126)
Anion gap: 6 (ref 5–15)
BUN: 44 mg/dL — ABNORMAL HIGH (ref 8–23)
CO2: 25 mmol/L (ref 22–32)
Calcium: 8.9 mg/dL (ref 8.9–10.3)
Chloride: 102 mmol/L (ref 98–111)
Creatinine, Ser: 1.31 mg/dL — ABNORMAL HIGH (ref 0.61–1.24)
GFR, Estimated: 58 mL/min — ABNORMAL LOW (ref >60)
Glucose, Bld: 106 mg/dL — ABNORMAL HIGH (ref 70–99)
Potassium: 5 mmol/L (ref 3.5–5.1)
Sodium: 133 mmol/L — ABNORMAL LOW (ref 135–145)
Total Bilirubin: <0.1 mg/dL — ABNORMAL LOW (ref 0.3–1.2)
Total Protein: 7.3 g/dL (ref 6.5–8.1)

## 2021-09-09 MED ORDER — FAMOTIDINE IN NACL 20-0.9 MG/50ML-% IV SOLN
20.0000 mg | Freq: Once | INTRAVENOUS | Status: AC
  Administered 2021-09-09: 20 mg via INTRAVENOUS
  Filled 2021-09-09: qty 50

## 2021-09-09 MED ORDER — HEPARIN SOD (PORK) LOCK FLUSH 100 UNIT/ML IV SOLN
500.0000 [IU] | Freq: Once | INTRAVENOUS | Status: AC | PRN
  Administered 2021-09-09: 500 [IU]
  Filled 2021-09-09: qty 5

## 2021-09-09 MED ORDER — DIPHENHYDRAMINE HCL 50 MG/ML IJ SOLN
50.0000 mg | Freq: Once | INTRAMUSCULAR | Status: AC
  Administered 2021-09-09: 50 mg via INTRAVENOUS
  Filled 2021-09-09: qty 1

## 2021-09-09 MED ORDER — SODIUM CHLORIDE 0.9 % IV SOLN
45.0000 mg/m2 | Freq: Once | INTRAVENOUS | Status: AC
  Administered 2021-09-09: 96 mg via INTRAVENOUS
  Filled 2021-09-09: qty 16

## 2021-09-09 MED ORDER — SODIUM CHLORIDE 0.9 % IV SOLN
179.4000 mg | Freq: Once | INTRAVENOUS | Status: AC
  Administered 2021-09-09: 180 mg via INTRAVENOUS
  Filled 2021-09-09: qty 18

## 2021-09-09 MED ORDER — PALONOSETRON HCL INJECTION 0.25 MG/5ML
0.2500 mg | Freq: Once | INTRAVENOUS | Status: AC
  Administered 2021-09-09: 0.25 mg via INTRAVENOUS
  Filled 2021-09-09: qty 5

## 2021-09-09 MED ORDER — SODIUM CHLORIDE 0.9 % IV SOLN
Freq: Once | INTRAVENOUS | Status: AC
  Filled 2021-09-09: qty 250

## 2021-09-09 MED ORDER — SODIUM CHLORIDE 0.9 % IV SOLN
10.0000 mg | Freq: Once | INTRAVENOUS | Status: AC
  Administered 2021-09-09: 10 mg via INTRAVENOUS
  Filled 2021-09-09: qty 10

## 2021-09-09 NOTE — Patient Instructions (Signed)
[facility]  Discharge Instructions: °Thank you for choosing [facility] to provide your oncology and hematology care.  ° °If you have a lab appointment with the Cancer Center, please go directly to the Cancer Center and check in at the registration area. °  °Wear comfortable clothing and clothing appropriate for easy access to any Portacath or PICC line.  ° °We strive to give you quality time with your provider. You may need to reschedule your appointment if you arrive late (15 or more minutes).  Arriving late affects you and other patients whose appointments are after yours.  Also, if you miss three or more appointments without notifying the office, you may be dismissed from the clinic at the provider’s discretion.    °  °For prescription refill requests, have your pharmacy contact our office and allow 72 hours for refills to be completed.   ° °Today you received the following chemotherapy and/or immunotherapy agents     °  °To help prevent nausea and vomiting after your treatment, we encourage you to take your nausea medication as directed. ° °BELOW ARE SYMPTOMS THAT SHOULD BE REPORTED IMMEDIATELY: °*FEVER GREATER THAN 100.4 F (38 °C) OR HIGHER °*CHILLS OR SWEATING °*NAUSEA AND VOMITING THAT IS NOT CONTROLLED WITH YOUR NAUSEA MEDICATION °*UNUSUAL SHORTNESS OF BREATH °*UNUSUAL BRUISING OR BLEEDING °*URINARY PROBLEMS (pain or burning when urinating, or frequent urination) °*BOWEL PROBLEMS (unusual diarrhea, constipation, pain near the anus) °TENDERNESS IN MOUTH AND THROAT WITH OR WITHOUT PRESENCE OF ULCERS (sore throat, sores in mouth, or a toothache) °UNUSUAL RASH, SWELLING OR PAIN  °UNUSUAL VAGINAL DISCHARGE OR ITCHING  ° °Items with * indicate a potential emergency and should be followed up as soon as possible or go to the Emergency Department if any problems should occur. ° °Please show the CHEMOTHERAPY ALERT CARD or IMMUNOTHERAPY ALERT CARD at check-in to the  Emergency Department and triage nurse. ° °Should you have questions after your visit or need to cancel or reschedule your appointment, please contact [facility]  Dept: [phone]  and follow the prompts.  Office hours are 8:00 a.m. to 4:30 p.m. Monday - Friday. Please note that voicemails left after 4:00 p.m. may not be returned until the following business day.  We are closed weekends and major holidays. You have access to a nurse at all times for urgent questions. Please call the main number to the clinic Dept: [phone] and follow the prompts. ° ° °For any non-urgent questions, you may also contact your provider using MyChart. We now offer e-Visits for anyone 18 and older to request care online for non-urgent symptoms. For details visit [email or web address]. °  °Also download the MyChart app! Go to the app store, search "MyChart", open the app, select [facility], and log in with your MyChart username and password. ° °Due to Covid, a mask is required upon entering the hospital/clinic. If you do not have a mask, one will be given to you upon arrival. For doctor visits, patients may have 1 support person aged 18 or older with them. For treatment visits, patients cannot have anyone with them due to current Covid guidelines and our immunocompromised population.  ° °

## 2021-09-10 ENCOUNTER — Encounter: Payer: Self-pay | Admitting: Oncology

## 2021-09-10 ENCOUNTER — Ambulatory Visit: Payer: No Typology Code available for payment source

## 2021-09-10 ENCOUNTER — Ambulatory Visit
Admission: RE | Admit: 2021-09-10 | Discharge: 2021-09-10 | Disposition: A | Discharge status: Home / Self Care | Payer: No Typology Code available for payment source | Source: Ambulatory Visit | Attending: Radiation Oncology | Admitting: Radiation Oncology

## 2021-09-10 DIAGNOSIS — Z510 Encounter for antineoplastic radiation therapy: Secondary | ICD-10-CM | POA: Diagnosis not present

## 2021-09-11 ENCOUNTER — Ambulatory Visit: Payer: No Typology Code available for payment source

## 2021-09-11 ENCOUNTER — Encounter (INDEPENDENT_AMBULATORY_CARE_PROVIDER_SITE_OTHER): Payer: Medicare Other

## 2021-09-11 ENCOUNTER — Inpatient Hospital Stay: Payer: No Typology Code available for payment source | Attending: Oncology

## 2021-09-11 ENCOUNTER — Ambulatory Visit (INDEPENDENT_AMBULATORY_CARE_PROVIDER_SITE_OTHER): Payer: Medicare Other | Admitting: Vascular Surgery

## 2021-09-11 ENCOUNTER — Other Ambulatory Visit: Payer: Self-pay

## 2021-09-11 ENCOUNTER — Ambulatory Visit
Admission: RE | Admit: 2021-09-11 | Discharge: 2021-09-11 | Disposition: A | Discharge status: Home / Self Care | Payer: No Typology Code available for payment source | Source: Ambulatory Visit | Attending: Radiation Oncology | Admitting: Radiation Oncology

## 2021-09-11 ENCOUNTER — Other Ambulatory Visit: Payer: No Typology Code available for payment source

## 2021-09-11 VITALS — BP 150/77 | HR 63 | Temp 97.9°F | Resp 18

## 2021-09-11 DIAGNOSIS — I959 Hypotension, unspecified: Secondary | ICD-10-CM | POA: Diagnosis not present

## 2021-09-11 DIAGNOSIS — Z510 Encounter for antineoplastic radiation therapy: Secondary | ICD-10-CM | POA: Diagnosis not present

## 2021-09-11 DIAGNOSIS — C3412 Malignant neoplasm of upper lobe, left bronchus or lung: Secondary | ICD-10-CM | POA: Diagnosis present

## 2021-09-11 DIAGNOSIS — R110 Nausea: Secondary | ICD-10-CM | POA: Diagnosis not present

## 2021-09-11 DIAGNOSIS — R197 Diarrhea, unspecified: Secondary | ICD-10-CM | POA: Diagnosis not present

## 2021-09-11 DIAGNOSIS — D649 Anemia, unspecified: Secondary | ICD-10-CM | POA: Insufficient documentation

## 2021-09-11 DIAGNOSIS — R042 Hemoptysis: Secondary | ICD-10-CM | POA: Diagnosis not present

## 2021-09-11 DIAGNOSIS — E860 Dehydration: Secondary | ICD-10-CM

## 2021-09-11 MED ORDER — SODIUM CHLORIDE 0.9 % IV SOLN
Freq: Once | INTRAVENOUS | Status: AC
  Filled 2021-09-11: qty 250

## 2021-09-11 MED ORDER — HEPARIN SOD (PORK) LOCK FLUSH 100 UNIT/ML IV SOLN
500.0000 [IU] | Freq: Once | INTRAVENOUS | Status: AC
  Administered 2021-09-11: 500 [IU] via INTRAVENOUS
  Filled 2021-09-11: qty 5

## 2021-09-11 MED ORDER — SODIUM CHLORIDE 0.9% FLUSH
10.0000 mL | Freq: Once | INTRAVENOUS | Status: AC
  Administered 2021-09-11: 10 mL via INTRAVENOUS
  Filled 2021-09-11: qty 10

## 2021-09-12 ENCOUNTER — Encounter: Payer: Self-pay | Admitting: Oncology

## 2021-09-12 ENCOUNTER — Encounter: Payer: Self-pay | Admitting: *Deleted

## 2021-09-12 ENCOUNTER — Other Ambulatory Visit: Payer: Self-pay | Admitting: *Deleted

## 2021-09-12 MED ORDER — BACLOFEN 10 MG PO TABS: 10.0000 mg | ORAL_TABLET | Freq: Three times a day (TID) | ORAL | 1 refills | Status: DC | PRN

## 2021-09-12 NOTE — Progress Notes (Signed)
Orders in 

## 2021-09-12 NOTE — Telephone Encounter (Signed)
I sent a my chart message to pt about the medicine for hiccups. I sent it onsat. Because my computer would not work when I took it home. ?

## 2021-09-14 ENCOUNTER — Ambulatory Visit: Payer: No Typology Code available for payment source

## 2021-09-14 ENCOUNTER — Ambulatory Visit
Admission: RE | Admit: 2021-09-14 | Discharge: 2021-09-14 | Disposition: A | Discharge status: Home / Self Care | Payer: No Typology Code available for payment source | Source: Ambulatory Visit | Attending: Radiation Oncology | Admitting: Radiation Oncology

## 2021-09-14 ENCOUNTER — Ambulatory Visit: Payer: No Typology Code available for payment source | Admitting: Cardiology

## 2021-09-14 ENCOUNTER — Encounter: Payer: Self-pay | Admitting: Cardiology

## 2021-09-14 ENCOUNTER — Ambulatory Visit: Payer: Medicare Other | Admitting: Cardiology

## 2021-09-14 ENCOUNTER — Other Ambulatory Visit: Payer: Self-pay

## 2021-09-14 VITALS — BP 100/50 | HR 74 | Ht 70.0 in | Wt 191.0 lb

## 2021-09-14 DIAGNOSIS — I480 Paroxysmal atrial fibrillation: Secondary | ICD-10-CM

## 2021-09-14 DIAGNOSIS — I10 Essential (primary) hypertension: Secondary | ICD-10-CM | POA: Diagnosis not present

## 2021-09-14 DIAGNOSIS — I2510 Atherosclerotic heart disease of native coronary artery without angina pectoris: Secondary | ICD-10-CM

## 2021-09-14 DIAGNOSIS — I5020 Unspecified systolic (congestive) heart failure: Secondary | ICD-10-CM

## 2021-09-14 DIAGNOSIS — Z510 Encounter for antineoplastic radiation therapy: Secondary | ICD-10-CM | POA: Diagnosis not present

## 2021-09-14 MED ORDER — CARVEDILOL 25 MG PO TABS: 12.5000 mg | ORAL_TABLET | Freq: Two times a day (BID) | ORAL | 3 refills | Status: DC

## 2021-09-14 NOTE — Patient Instructions (Signed)
Medication Instructions:  ? ?Your physician has recommended you make the following change in your medication:  ? ? STOP taking Spironolactone (Aldactone). ? ?*If you need a refill on your cardiac medications before your next appointment, please call your pharmacy* ? ? ?Lab Work: ?None ordered ?If you have labs (blood work) drawn today and your tests are completely normal, you will receive your results only by: ?MyChart Message (if you have MyChart) OR ?A paper copy in the mail ?If you have any lab test that is abnormal or we need to change your treatment, we will call you to review the results. ? ? ?Testing/Procedures: ?None ordered ? ? ?Follow-Up: ?At [facility], you and your health needs are our priority.  As part of our continuing mission to provide you with exceptional heart care, we have created designated Provider Care Teams.  These Care Teams include your primary Cardiologist (physician) and Advanced Practice Providers (APPs -  Physician Assistants and Nurse Practitioners) who all work together to provide you with the care you need, when you need it. ? ?We recommend signing up for the patient portal called "MyChart".  Sign up information is provided on this After Visit Summary.  MyChart is used to connect with patients for Virtual Visits (Telemedicine).  Patients are able to view lab/test results, encounter notes, upcoming appointments, etc.  Non-urgent messages can be sent to your provider as well.   ?To learn more about what you can do with MyChart, go to [email or web address].   ? ?Your next appointment:   ?6 week(s) ? ?The format for your next appointment:   ?In Person ? ?Provider:   ? ?ONLY WITH ?Kate Sable, MD  ? ? ?Other Instructions ? ? ?

## 2021-09-14 NOTE — Progress Notes (Signed)
Cardiology Office Note:    Date:  09/14/2021   ID:  Richard Wiggins, DOB [birth year], MRN [id number]  PCP:  Pcp, No  [facility] Cardiologist:  Kate Sable, MD  [place] Electrophysiologist:  None   Referring MD: Margo Common, PA-C   Chief Complaint  Patient presents with   Follow-up    6 month F/U-No new cardiac concerns    History of Present Illness:    Richard Wiggins is a 71 y.o. 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 fib on Eliquis, CAD s/p PCI to RCA 2018 (LAD 50%, LCx 20%) in [place], former smoker x40+ years, COPD, neoplasm left upper lobe on chemo, Who presents for follow-up.  Being seen for paroxysmal A. fib, CAD, prior CHF with reduced EF, now normalized.  Currently on chemo for lung cancer. Has had diarrhea over the past week. He has poor appetite and nausea. Takes coreg, entresto, aldactone as prescribed. Takes lasix prn.     Prior notes Patient's her prior echocardiogram in 06/08/2017 in [place] noted with EF 30 to 35%.  Underwent a left heart catheter coronary in 2018 showing severe stenosis in the 85%.  He underwent PCI to the RCA with drug-eluting stent.  During that hospitalization, EKG/telemetry monitoring showed paroxysmal A. fib.  Also had a stroke during that hospitalization and CEA was performed.  He was subsequently placed on Eliquis.   Echocardiogram on 02/01/2020 showed normal systolic function, EF 60 to 75%, normal diastolic function.  Past Medical History:  Diagnosis Date   AAA (abdominal aortic aneurysm) 2020   a. 2020 U/S: 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 -plan medical managmment with tight bp control; b. 09/2020 Abd U/S: 4.1cm.   Anxiety    Aortic atherosclerosis (HCC)    Benign essential HTN 02/10/2020   BPH (benign prostatic hyperplasia) 02/09/2020   Brain aneurysm    Cancer of upper lobe of left lung ([place]) 04/2020   a.)  ENB/EBUS performed; Bx non-diagnostic. b.) presumed stage I NSCLC in the LUL; underwent SBRT (60 Gy over 5 fractions)   Carotid arterial disease ([place])    a. 05/2017 s/p R carotid endarterectomy following CVA.   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 2018 Echo: EF 30-35%; b. 02/2021 Echo: EF 55-60%, nl RV fxn. Triv MR. Asc Ao 27mm.   CKD (chronic kidney disease), stage III (HCC)    Claudication ([place])    a. 09/2020 ABIs: R 0.91. L 0.99.   COPD (chronic obstructive pulmonary disease) (HCC)    Coronary artery disease    a. 06/2017 PCI ([place]): s/p PCI to the RCA. LAD 50, LCX 20.   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 02/2021 Asc Ao 71mm.   Emphysema of lung (HCC)    HLD (hyperlipidemia)    IDA (iron deficiency anemia) 02/09/2020   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   Ischemic cardiomyopathy    Long term current use of anticoagulant    a.) apixaban   Marijuana use, continuous 02/10/2020   Osteoporosis    PAF (paroxysmal atrial fibrillation) ([place])    a. 05/2017 Dx in setting of CVA. CHA2DS2VASc = 6-->Eliquis.   Pre-diabetes    Pulmonary emboli ([place]) 06/07/2017   Right lower lobe pulmonary embolism small segmental, multifocal multifocal pneumonia, mediastinal lymphadenopathy, moderate centrilobular emphysema   Seizures ([place])  Sigmoid diverticulosis    a.) CT 01/27/2021: severe   Stroke [facility])    a. 05/2017 - hospitalized in [place]->prolonged hospitalization in setting of R CEA, PE, and finding of RCA dzs on cath; b. Residual left sided weakness to arm and leg.    Past Surgical History:  Procedure Laterality Date   brain aneurysm with clip     COLONOSCOPY WITH PROPOFOL N/A 07/23/2020   Procedure: COLONOSCOPY WITH PROPOFOL;  Surgeon: Lin Landsman, MD;  Location: [facility] ENDOSCOPY;  Service: Gastroenterology;  Laterality: N/A;   CORONARY ANGIOPLASTY WITH STENT PLACEMENT  07/08/2017   PORTA CATH INSERTION N/A  08/31/2021   Procedure: PORTA CATH INSERTION;  Surgeon: Algernon Huxley, MD;  Location: [place] CV LAB;  Service: Cardiovascular;  Laterality: N/A;   TOTAL HIP ARTHROPLASTY Right    VIDEO BRONCHOSCOPY WITH ENDOBRONCHIAL NAVIGATION N/A 04/23/2020   Procedure: VIDEO BRONCHOSCOPY WITH ENDOBRONCHIAL NAVIGATION;  Surgeon: Tyler Pita, MD;  Location: [facility] ORS;  Service: Pulmonary;  Laterality: N/A;   VIDEO BRONCHOSCOPY WITH ENDOBRONCHIAL ULTRASOUND Left 08/12/2021   Procedure: VIDEO BRONCHOSCOPY WITH ENDOBRONCHIAL ULTRASOUND;  Surgeon: Tyler Pita, MD;  Location: [facility] ORS;  Service: Cardiopulmonary;  Laterality: Left;    Current Medications: Current Meds  Medication Sig   albuterol (PROVENTIL) (2.5 MG/3ML) 0.083% nebulizer solution Take 3 mLs (2.5 mg total) by nebulization every 6 (six) hours as needed for wheezing or shortness of breath.   albuterol (VENTOLIN HFA) 108 (90 Base) MCG/ACT inhaler Inhale 2 puffs into the lungs every 6 (six) hours as needed for wheezing or shortness of breath.   apixaban (ELIQUIS) 5 MG TABS tablet TAKE 1 TABLET(5 MG) BY MOUTH IN THE MORNING AND AT BEDTIME   aspirin EC 81 MG tablet Take 81 mg by mouth daily. Swallow whole.   atorvastatin (LIPITOR) 40 MG tablet TAKE 1 TABLET(40 MG) BY MOUTH AT BEDTIME   baclofen (LIORESAL) 10 MG tablet Take 1 tablet (10 mg total) by mouth 3 (three) times daily as needed for muscle spasms.   Budeson-Glycopyrrol-Formoterol (BREZTRI AEROSPHERE) 160-9-4.8 MCG/ACT AERO Inhale 2 puffs into the lungs in the morning and at bedtime.   dexamethasone (DECADRON) 4 MG tablet Take 2 tablets (8 mg total) by mouth daily. Start the day after chemotherapy for 2 days.   furosemide (LASIX) 40 MG tablet Take 1 tablet (40 mg total) by mouth as needed.   lamoTRIgine (LAMICTAL) 100 MG tablet Take 100 mg by mouth 2 (two) times daily.   Lidocaine HCl-Benzyl Alcohol (SALONPAS LIDOCAINE PLUS EX) Place 1 patch onto the skin daily as needed (pain.).    lidocaine-prilocaine (EMLA) cream Apply to affected area once   LORazepam (ATIVAN) 0.5 MG tablet Take 1 tablet (0.5 mg total) by mouth every 6 (six) hours as needed (Nausea or vomiting).   Multiple Vitamin (MULTI-VITAMIN) tablet Take 1 tablet by mouth daily.   ondansetron (ZOFRAN) 8 MG tablet Take 1 tablet (8 mg total) by mouth 2 (two) times daily as needed for refractory nausea / vomiting. Start on day 3 after chemo.   prochlorperazine (COMPAZINE) 10 MG tablet Take 1 tablet (10 mg total) by mouth every 6 (six) hours as needed (Nausea or vomiting).   sacubitril-valsartan (ENTRESTO) 49-51 MG Take 0.5 tablets by mouth in the morning and at bedtime.   tamsulosin (FLOMAX) 0.4 MG CAPS capsule Take 1 capsule (0.4 mg total) by mouth daily.   [DISCONTINUED] carvedilol (COREG) 25 MG tablet Take 0.5 tablets (12.5 mg total) by mouth in the morning and  at bedtime.   [DISCONTINUED] spironolactone (ALDACTONE) 25 MG tablet Take 0.5 tablets (12.5 mg total) by mouth at bedtime.     Allergies:   Tape and Wound dressing adhesive   Social History   Socioeconomic History   Marital status: Married    Spouse name: Butch Penny   Number of children: Not on file   Years of education: Not on file   Highest education level: High school graduate  Occupational History   Occupation: Retired  Tobacco Use   Smoking status: Former    Packs/day: 2.00    Years: 53.00    Pack years: 106.00    Types: Cigarettes    Quit date: 05/28/2017    Years since quitting: 4.3   Smokeless tobacco: Never   Tobacco comments:    Quit in 2018  Vaping Use   Vaping Use: Never used  Substance and Sexual Activity   Alcohol use: Not Currently    Comment: socially drink cocktail   Drug use: Not Currently    Types: Marijuana    Comment: last smoke x1 month ago   Sexual activity: Not Currently  Other Topics Concern   Not on file  Social History Narrative   Lives with wife. 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 Children x2  children and grandchildren grown.    Social Determinants of Health   Financial Resource Strain: Low Risk    Difficulty of Paying Living Expenses: Not hard at all  Food Insecurity: No Food Insecurity   Worried About Charity fundraiser in the Last Year: Never true   [place] in the Last Year: Never true  Transportation Needs: No Transportation Needs   Lack of Transportation (Medical): No   Lack of Transportation (Non-Medical): No  Physical Activity: Inactive   Days of Exercise per Week: 0 days   Minutes of Exercise per Session: 0 min  Stress: Stress Concern Present   Feeling of Stress : Rather much  Social Connections: Moderately Isolated   Frequency of Communication with Friends and Family: Never   Frequency of Social Gatherings with Friends and Family: Never   Attends Religious Services: Never   Marine scientist or Organizations: Yes   Attends Music therapist: Never   Marital Status: Married     Family History: The patient's family history includes Alcohol abuse in his father; Diabetes in his brother and mother; Heart attack in his brother, father, maternal grandmother, paternal grandmother, and sister; Heart disease in his brother, father, maternal grandmother, mother, and sister; Stroke in his father.  ROS:   Please see the history of present illness.     All other systems reviewed and are negative.  EKGs/Labs/Other Studies Reviewed:    The following studies were reviewed today:   EKG:  EKG is  ordered today.  The ekg ordered today demonstrates normal sinus rhythm, normal ECG.  Recent Labs: 02/03/2021: TSH 5.720 09/09/2021: ALT 17; BUN 44; Creatinine, Ser 1.31; Hemoglobin 8.9; Platelets 148; Potassium 5.0; Sodium 133  Recent Lipid Panel    Component Value Date/Time   CHOL 116 05/27/2020 0802   TRIG 70.0 05/27/2020 0802   HDL 46.10 05/27/2020 0802   CHOLHDL 3 05/27/2020 0802   VLDL 14.0 05/27/2020 0802   LDLCALC 56 05/27/2020 0802     Physical Exam:    VS:  BP (!) 100/50 (BP Location: Left Arm, Patient Position: Sitting, Cuff Size: Normal)    Pulse 74    Ht 5\' 10"  (1.778 m)  Wt 191 lb (86.6 kg)    SpO2 95%    BMI 27.41 kg/m     Wt Readings from Last 3 Encounters:  09/14/21 191 lb (86.6 kg)  09/09/21 195 lb 7 oz (88.6 kg)  09/02/21 196 lb 1.6 oz (89 kg)     GEN:  Well nourished, well developed in no acute distress HEENT: Normal NECK: No JVD; No carotid bruits LYMPHATICS: No lymphadenopathy CARDIAC: RRR, no murmurs, rubs, gallops RESPIRATORY:  Clear to auscultation without rales, wheezing or rhonchi  ABDOMEN: Soft, non-tender, non-distended MUSCULOSKELETAL:  1+ edema; No deformity  SKIN: Warm and dry NEUROLOGIC:  Alert and oriented x 3 PSYCHIATRIC:  Normal affect   ASSESSMENT:    1. Coronary artery disease involving native coronary artery of native heart without angina pectoris   2. Primary hypertension   3. HFrEF (heart failure with reduced ejection fraction) ([place])   4. PAF (paroxysmal atrial fibrillation) (HCC)     PLAN:    In order of problems listed above:  CAD status post PCI to RCA (2018).  Denies chest pain.  Continue aspirin, statin. LDL at goal. Hx of htn, bp low normal. Diarrhea 2/2 chemo. Stop aldactone. Cont coreg, entresto. Consider decreasing coreg if bp stays low. History of HFrEF, last echo 02/2021 EF 55 to 60%.  Continue Coreg, Entresto  Take Lasix as needed. paroxysmal atrial fibrillation, currently in sinus rhythm.  CHA2DS2-VASc of 5 (age, htn, vasc, cva). Coreg, Eliquis.  Follow-up in 6 weeks   Medication Adjustments/Labs and Tests Ordered: Current medicines are reviewed at length with the patient today.  Concerns regarding medicines are outlined above.  Orders Placed This Encounter  Procedures   EKG 12-Lead   Meds ordered this encounter  Medications   carvedilol (COREG) 25 MG tablet    Sig: Take 0.5 tablets (12.5 mg total) by mouth in the morning and at bedtime.     Dispense:  60 tablet    Refill:  3    Patient Instructions  Medication Instructions:   Your physician has recommended you make the following change in your medication:    STOP taking Spironolactone (Aldactone).  *If you need a refill on your cardiac medications before your next appointment, please call your pharmacy*   Lab Work: None ordered If you have labs (blood work) drawn today and your tests are completely normal, you will receive your results only by: [place] (if you have MyChart) OR A paper copy in the mail If you have any lab test that is abnormal or we need to change your treatment, we will call you to review the results.   Testing/Procedures: None ordered   Follow-Up: At [facility], you and your health needs are our priority.  As part of our continuing mission to provide you with exceptional heart care, we have created designated Provider Care Teams.  These Care Teams include your primary Cardiologist (physician) and Advanced Practice Providers (APPs -  Physician Assistants and Nurse Practitioners) who all work together to provide you with the care you need, when you need it.  We recommend signing up for the patient portal called "MyChart".  Sign up information is provided on this After Visit Summary.  MyChart is used to connect with patients for Virtual Visits (Telemedicine).  Patients are able to view lab/test results, encounter notes, upcoming appointments, etc.  Non-urgent messages can be sent to your provider as well.   To learn more about what you can do with MyChart, go to [email or web address].  Your next appointment:   6 week(s)  The format for your next appointment:   In Person  Provider:    ONLY WITH Kate Sable, MD    Other Instructions     Signed, Kate Sable, MD  09/14/2021 4:14 PM    [place]

## 2021-09-14 NOTE — Telephone Encounter (Signed)
Can you call him?

## 2021-09-15 ENCOUNTER — Other Ambulatory Visit: Payer: Self-pay

## 2021-09-15 ENCOUNTER — Ambulatory Visit
Admission: RE | Admit: 2021-09-15 | Discharge: 2021-09-15 | Disposition: A | Discharge status: Home / Self Care | Payer: No Typology Code available for payment source | Source: Ambulatory Visit | Attending: Radiation Oncology | Admitting: Radiation Oncology

## 2021-09-15 ENCOUNTER — Encounter: Payer: Self-pay | Admitting: Oncology

## 2021-09-15 ENCOUNTER — Inpatient Hospital Stay: Payer: No Typology Code available for payment source

## 2021-09-15 ENCOUNTER — Other Ambulatory Visit: Payer: Self-pay | Admitting: *Deleted

## 2021-09-15 ENCOUNTER — Inpatient Hospital Stay (HOSPITAL_BASED_OUTPATIENT_CLINIC_OR_DEPARTMENT_OTHER): Payer: No Typology Code available for payment source | Admitting: Hospice and Palliative Medicine

## 2021-09-15 ENCOUNTER — Ambulatory Visit: Payer: No Typology Code available for payment source

## 2021-09-15 VITALS — BP 72/52 | HR 78 | Temp 98.3°F | Resp 16

## 2021-09-15 DIAGNOSIS — C3490 Malignant neoplasm of unspecified part of unspecified bronchus or lung: Secondary | ICD-10-CM | POA: Diagnosis not present

## 2021-09-15 DIAGNOSIS — C3412 Malignant neoplasm of upper lobe, left bronchus or lung: Secondary | ICD-10-CM | POA: Diagnosis not present

## 2021-09-15 DIAGNOSIS — R110 Nausea: Secondary | ICD-10-CM | POA: Diagnosis not present

## 2021-09-15 DIAGNOSIS — R197 Diarrhea, unspecified: Secondary | ICD-10-CM

## 2021-09-15 DIAGNOSIS — Z510 Encounter for antineoplastic radiation therapy: Secondary | ICD-10-CM | POA: Diagnosis not present

## 2021-09-15 DIAGNOSIS — E860 Dehydration: Secondary | ICD-10-CM

## 2021-09-15 LAB — CBC WITH DIFFERENTIAL/PLATELET
Abs Immature Granulocytes: 0.01 10*3/uL (ref 0.00–0.07)
Basophils Absolute: 0 10*3/uL (ref 0.0–0.1)
Basophils Relative: 1 %
Eosinophils Absolute: 0 10*3/uL (ref 0.0–0.5)
Eosinophils Relative: 2 %
HCT: 28.5 % — ABNORMAL LOW (ref 39.0–52.0)
Hemoglobin: 9.1 g/dL — ABNORMAL LOW (ref 13.0–17.0)
Immature Granulocytes: 1 %
Lymphocytes Relative: 18 %
Lymphs Abs: 0.4 10*3/uL — ABNORMAL LOW (ref 0.7–4.0)
MCH: 28.8 pg (ref 26.0–34.0)
MCHC: 31.9 g/dL (ref 30.0–36.0)
MCV: 90.2 fL (ref 80.0–100.0)
Monocytes Absolute: 0.2 10*3/uL (ref 0.1–1.0)
Monocytes Relative: 10 %
Neutro Abs: 1.5 10*3/uL — ABNORMAL LOW (ref 1.7–7.7)
Neutrophils Relative %: 68 %
Platelets: 138 10*3/uL — ABNORMAL LOW (ref 150–400)
RBC: 3.16 MIL/uL — ABNORMAL LOW (ref 4.22–5.81)
RDW: 15.9 % — ABNORMAL HIGH (ref 11.5–15.5)
WBC: 2.1 10*3/uL — ABNORMAL LOW (ref 4.0–10.5)
nRBC: 0 % (ref 0.0–0.2)

## 2021-09-15 LAB — COMPREHENSIVE METABOLIC PANEL
ALT: 15 U/L (ref 0–44)
AST: 20 U/L (ref 15–41)
Albumin: 3.5 g/dL (ref 3.5–5.0)
Alkaline Phosphatase: 90 U/L (ref 38–126)
Anion gap: 5 (ref 5–15)
BUN: 46 mg/dL — ABNORMAL HIGH (ref 8–23)
CO2: 25 mmol/L (ref 22–32)
Calcium: 8.7 mg/dL — ABNORMAL LOW (ref 8.9–10.3)
Chloride: 102 mmol/L (ref 98–111)
Creatinine, Ser: 1.66 mg/dL — ABNORMAL HIGH (ref 0.61–1.24)
GFR, Estimated: 44 mL/min — ABNORMAL LOW (ref >60)
Glucose, Bld: 117 mg/dL — ABNORMAL HIGH (ref 70–99)
Potassium: 4.7 mmol/L (ref 3.5–5.1)
Sodium: 132 mmol/L — ABNORMAL LOW (ref 135–145)
Total Bilirubin: 0.8 mg/dL (ref 0.3–1.2)
Total Protein: 6.8 g/dL (ref 6.5–8.1)

## 2021-09-15 LAB — MAGNESIUM: Magnesium: 1.9 mg/dL (ref 1.7–2.4)

## 2021-09-15 MED ORDER — SODIUM CHLORIDE 0.9 % IV SOLN
Freq: Once | INTRAVENOUS | Status: AC
  Filled 2021-09-15: qty 250

## 2021-09-15 MED ORDER — SUCRALFATE 1 G PO TABS: 1.0000 g | ORAL_TABLET | Freq: Three times a day (TID) | ORAL | 0 refills | Status: DC

## 2021-09-15 MED ORDER — SODIUM CHLORIDE 0.9% FLUSH
10.0000 mL | Freq: Once | INTRAVENOUS | Status: AC
  Administered 2021-09-15: 10 mL via INTRAVENOUS
  Filled 2021-09-15: qty 10

## 2021-09-15 MED ORDER — HEPARIN SOD (PORK) LOCK FLUSH 100 UNIT/ML IV SOLN
500.0000 [IU] | Freq: Once | INTRAVENOUS | Status: AC
  Administered 2021-09-15: 500 [IU] via INTRAVENOUS
  Filled 2021-09-15: qty 5

## 2021-09-15 NOTE — Progress Notes (Signed)
Symptom Management [place] at [facility] [phone] [phone]  Patient Care Team: Pcp, No as PCP - General Kate Sable, MD as PCP - Cardiology (Cardiology) Telford Nab, RN as Oncology Nurse Navigator Noreene Filbert, MD as Referring Physician (Radiation Oncology) Sindy Guadeloupe, MD as Consulting Physician (Oncology)   Name of the patient: Richard Wiggins  [id number]  [birth year]   Date of visit: 09/15/21  Reason for Consult: Richard Wiggins is a 71 y.o. male with multiple medical problems including stage I lung cancer status post SBRT now with hilar recurrence on CarboTaxol chemotherapy.   Patient received cycle 1 carbo Taxol chemotherapy on 09/02/2021 and then return for cycle 2 of on 09/09/2021.  Patient presents to [facility] today for evaluation of diarrhea.  Patient reports that diarrhea started last week following last chemotherapy.  He has had multiple watery diarrheal stools daily.  He denies fever or chills.  No abdominal pain.  He also endorses some nausea but that has improved today after taking home antiemetics.  enies any neurologic complaints. Denies recent fevers or illnesses. Denies any easy bleeding or bruising. Reports good appetite and denies weight loss. Denies chest pain. Denies any nausea, vomiting, constipation, or diarrhea. Denies urinary complaints. Patient offers no further specific complaints today.    PAST MEDICAL HISTORY: Past Medical History:  Diagnosis Date   AAA (abdominal aortic aneurysm) 2020   a. 2020 U/S: 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 -plan medical managmment with tight bp control; b. 09/2020 Abd U/S: 4.1cm.   Anxiety    Aortic atherosclerosis (HCC)    Benign essential HTN 02/10/2020   BPH (benign prostatic hyperplasia) 02/09/2020   Brain aneurysm    Cancer of upper lobe of left lung ([place]) 04/2020   a.) ENB/EBUS performed; Bx non-diagnostic. b.) presumed stage I NSCLC in the LUL; underwent  SBRT (60 Gy over 5 fractions)   Carotid arterial disease ([place])    a. 05/2017 s/p R carotid endarterectomy following CVA.   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 2018 Echo: EF 30-35%; b. 02/2021 Echo: EF 55-60%, nl RV fxn. Triv MR. Asc Ao 27mm.   CKD (chronic kidney disease), stage III (HCC)    Claudication ([place])    a. 09/2020 ABIs: R 0.91. L 0.99.   COPD (chronic obstructive pulmonary disease) (HCC)    Coronary artery disease    a. 06/2017 PCI ([place]): s/p PCI to the RCA. LAD 50, LCX 20.   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 02/2021 Asc Ao 77mm.   Emphysema of lung (HCC)    HLD (hyperlipidemia)    IDA (iron deficiency anemia) 02/09/2020   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   Ischemic cardiomyopathy    Long term current use of anticoagulant    a.) apixaban   Marijuana use, continuous 02/10/2020   Osteoporosis    PAF (paroxysmal atrial fibrillation) ([place])    a. 05/2017 Dx in setting of CVA. CHA2DS2VASc = 6-->Eliquis.   Pre-diabetes    Pulmonary emboli ([place]) 06/07/2017   Right lower lobe pulmonary embolism small segmental, multifocal multifocal pneumonia, mediastinal lymphadenopathy, moderate centrilobular emphysema   Seizures ([place])    Sigmoid diverticulosis    a.) CT 01/27/2021: severe   Stroke [facility])    a. 05/2017 - hospitalized in [place]->prolonged hospitalization in setting of R CEA, PE, and finding of RCA dzs on cath; b. Residual left sided weakness to arm  and leg.    PAST SURGICAL HISTORY:  Past Surgical History:  Procedure Laterality Date   brain aneurysm with clip     COLONOSCOPY WITH PROPOFOL N/A 07/23/2020   Procedure: COLONOSCOPY WITH PROPOFOL;  Surgeon: Lin Landsman, MD;  Location: [facility] ENDOSCOPY;  Service: Gastroenterology;  Laterality: N/A;   CORONARY ANGIOPLASTY WITH STENT PLACEMENT  07/08/2017   PORTA CATH INSERTION N/A 08/31/2021   Procedure: PORTA CATH INSERTION;  Surgeon: Algernon Huxley, MD;  Location: [facility] CV LAB;  Service: Cardiovascular;  Laterality: N/A;   TOTAL HIP ARTHROPLASTY Right    VIDEO BRONCHOSCOPY WITH ENDOBRONCHIAL NAVIGATION N/A 04/23/2020   Procedure: VIDEO BRONCHOSCOPY WITH ENDOBRONCHIAL NAVIGATION;  Surgeon: Tyler Pita, MD;  Location: [facility] ORS;  Service: Pulmonary;  Laterality: N/A;   VIDEO BRONCHOSCOPY WITH ENDOBRONCHIAL ULTRASOUND Left 08/12/2021   Procedure: VIDEO BRONCHOSCOPY WITH ENDOBRONCHIAL ULTRASOUND;  Surgeon: Tyler Pita, MD;  Location: [facility] ORS;  Service: Cardiopulmonary;  Laterality: Left;    HEMATOLOGY/ONCOLOGY HISTORY:  Oncology History  Recurrent non-small cell lung cancer ([place])  08/18/2021 Initial Diagnosis   Recurrent non-small cell lung cancer ([place])   08/18/2021 Cancer Staging   Staging form: Lung, AJCC 8th Edition - Clinical stage from 08/18/2021: Stage Unknown (cTX, cN1, cM0) - Signed by Sindy Guadeloupe, MD on 08/18/2021    09/02/2021 -  Chemotherapy   Patient is on Treatment Plan : LUNG Carboplatin / Paclitaxel + XRT q7d       ALLERGIES:  is allergic to tape and wound dressing adhesive.  MEDICATIONS:  Current Outpatient Medications  Medication Sig Dispense Refill   albuterol (PROVENTIL) (2.5 MG/3ML) 0.083% nebulizer solution Take 3 mLs (2.5 mg total) by nebulization every 6 (six) hours as needed for wheezing or shortness of breath. 75 mL 6   albuterol (VENTOLIN HFA) 108 (90 Base) MCG/ACT inhaler Inhale 2 puffs into the lungs every 6 (six) hours as needed for wheezing or shortness of breath. 8 g 2   apixaban (ELIQUIS) 5 MG TABS tablet TAKE 1 TABLET(5 MG) BY MOUTH IN THE MORNING AND AT BEDTIME 60 tablet 3   aspirin EC 81 MG tablet Take 81 mg by mouth daily. Swallow whole.     atorvastatin (LIPITOR) 40 MG tablet TAKE 1 TABLET(40 MG) BY MOUTH AT BEDTIME 90 tablet 3   baclofen (LIORESAL) 10 MG tablet Take 1 tablet (10 mg total) by mouth 3 (three) times daily as needed for muscle spasms. 30 each 1    Budeson-Glycopyrrol-Formoterol (BREZTRI AEROSPHERE) 160-9-4.8 MCG/ACT AERO Inhale 2 puffs into the lungs in the morning and at bedtime. 10.7 g 11   carvedilol (COREG) 25 MG tablet Take 0.5 tablets (12.5 mg total) by mouth in the morning and at bedtime. 60 tablet 3   dexamethasone (DECADRON) 4 MG tablet Take 2 tablets (8 mg total) by mouth daily. Start the day after chemotherapy for 2 days. 30 tablet 1   furosemide (LASIX) 40 MG tablet Take 1 tablet (40 mg total) by mouth as needed. 90 tablet 2   lamoTRIgine (LAMICTAL) 100 MG tablet Take 100 mg by mouth 2 (two) times daily.     Lidocaine HCl-Benzyl Alcohol (SALONPAS LIDOCAINE PLUS EX) Place 1 patch onto the skin daily as needed (pain.).     lidocaine-prilocaine (EMLA) cream Apply to affected area once 30 g 3   LORazepam (ATIVAN) 0.5 MG tablet Take 1 tablet (0.5 mg total) by mouth every 6 (six) hours as needed (Nausea or vomiting). 30 tablet 0   Multiple  Vitamin (MULTI-VITAMIN) tablet Take 1 tablet by mouth daily.     ondansetron (ZOFRAN) 8 MG tablet Take 1 tablet (8 mg total) by mouth 2 (two) times daily as needed for refractory nausea / vomiting. Start on day 3 after chemo. 30 tablet 1   prochlorperazine (COMPAZINE) 10 MG tablet Take 1 tablet (10 mg total) by mouth every 6 (six) hours as needed (Nausea or vomiting). 30 tablet 1   sacubitril-valsartan (ENTRESTO) 49-51 MG Take 0.5 tablets by mouth in the morning and at bedtime.     sucralfate (CARAFATE) 1 g tablet Take 1 tablet (1 g total) by mouth 3 (three) times daily. Dissolve in 3-4 tbsp warm water, swish and swallow. 90 tablet 0   tamsulosin (FLOMAX) 0.4 MG CAPS capsule Take 1 capsule (0.4 mg total) by mouth daily. 90 capsule 0   No current facility-administered medications for this visit.    VITAL SIGNS: There were no vitals taken for this visit. There were no vitals filed for this visit.  Estimated body mass index is 27.41 kg/m as calculated from the following:   Height as of 09/14/21: 5'  10" (1.778 m).   Weight as of 09/14/21: 191 lb (86.6 kg).  LABS: CBC:    Component Value Date/Time   WBC 3.3 (L) 09/09/2021 0833   HGB 8.9 (L) 09/09/2021 0833   HCT 28.9 (L) 09/09/2021 0833   PLT 148 (L) 09/09/2021 0833   MCV 92.0 09/09/2021 0833   NEUTROABS 2.4 09/09/2021 0833   LYMPHSABS 0.6 (L) 09/09/2021 0833   MONOABS 0.2 09/09/2021 0833   EOSABS 0.1 09/09/2021 0833   BASOSABS 0.0 09/09/2021 0833   Comprehensive Metabolic Panel:    Component Value Date/Time   NA 133 (L) 09/09/2021 0833   K 5.0 09/09/2021 0833   CL 102 09/09/2021 0833   CO2 25 09/09/2021 0833   BUN 44 (H) 09/09/2021 0833   CREATININE 1.31 (H) 09/09/2021 0833   GLUCOSE 106 (H) 09/09/2021 0833   CALCIUM 8.9 09/09/2021 0833   AST 22 09/09/2021 0833   ALT 17 09/09/2021 0833   ALKPHOS 102 09/09/2021 0833   BILITOT <0.1 (L) 09/09/2021 0833   PROT 7.3 09/09/2021 0833   ALBUMIN 3.5 09/09/2021 0833    RADIOGRAPHIC STUDIES: PERIPHERAL VASCULAR CATHETERIZATION  Result Date: 08/31/2021 See surgical note for result.   PERFORMANCE STATUS (ECOG) : 1 - Symptomatic but completely ambulatory  Review of Systems Unless otherwise noted, a complete review of systems is negative.  Physical Exam General: NAD Cardiovascular: regular rate and rhythm Pulmonary: clear ant fields Abdomen: soft, nontender, + bowel sounds GU: no suprapubic tenderness Extremities: no edema, no joint deformities Skin: no rashes Neurological: Weakness but otherwise nonfocal  Assessment and Plan- Patient is a 71 y.o. male with multiple medical problems including stage I lung cancer status post SBRT now with hilar recurrence on CarboTaxol chemotherapy.  Patient presents to [facility] for evaluation of diarrhea   Nausea/diarrhea -likely secondary to chemotherapy.  However, will send for stool culture and C. difficile PCR to rule out infectious etiology.  Continue scheduled antiemetics and plan for antidiarrheals if negative stool studies.  Patient  was noted to be orthostatic in clinic and reports that he has discussed low blood pressure with his cardiologist with plan to discontinue spironolactone.  IV fluids today.  Patient will RTC tomorrow for repeat labs/fluids.  We will likely delay chemotherapy this week and have scheduled follow-up next week.  Case and plan discussed with Dr. Janese Banks   Patient expressed  understanding and was in agreement with this plan. He also understands that He can call clinic at any time with any questions, concerns, or complaints.   Thank you for allowing me to participate in the care of this very pleasant patient.   Time Total: 15 minutes  Visit consisted of counseling and education dealing with the complex and emotionally intense issues of symptom management in the setting of serious illness.Greater than 50%  of this time was spent counseling and coordinating care related to the above assessment and plan.  Signed by: Altha Harm, PhD, NP-C

## 2021-09-15 NOTE — Progress Notes (Signed)
Pt c/o diarrhea since Friday. Reports that he's had some vomiting as well as poor appetite. Also states that BP has been running low at home. BP 90/52 in clinic, while sitting; however dropped to 72/52 with standing. He denies any dizziness.  ?

## 2021-09-15 NOTE — Telephone Encounter (Signed)
Message left for patient's wife yesterday.  ?

## 2021-09-16 ENCOUNTER — Other Ambulatory Visit: Payer: Self-pay

## 2021-09-16 ENCOUNTER — Ambulatory Visit
Admission: RE | Admit: 2021-09-16 | Discharge: 2021-09-16 | Disposition: A | Discharge status: Home / Self Care | Payer: No Typology Code available for payment source | Source: Ambulatory Visit | Attending: Radiation Oncology | Admitting: Radiation Oncology

## 2021-09-16 ENCOUNTER — Encounter: Payer: Self-pay | Admitting: *Deleted

## 2021-09-16 ENCOUNTER — Inpatient Hospital Stay (HOSPITAL_BASED_OUTPATIENT_CLINIC_OR_DEPARTMENT_OTHER): Payer: No Typology Code available for payment source | Admitting: Oncology

## 2021-09-16 ENCOUNTER — Inpatient Hospital Stay: Payer: No Typology Code available for payment source

## 2021-09-16 ENCOUNTER — Ambulatory Visit: Payer: No Typology Code available for payment source

## 2021-09-16 ENCOUNTER — Encounter: Payer: Self-pay | Admitting: Oncology

## 2021-09-16 VITALS — BP 93/63 | HR 67 | Temp 97.2°F | Resp 18 | Wt 191.0 lb

## 2021-09-16 DIAGNOSIS — C3490 Malignant neoplasm of unspecified part of unspecified bronchus or lung: Secondary | ICD-10-CM | POA: Diagnosis not present

## 2021-09-16 DIAGNOSIS — Z95828 Presence of other vascular implants and grafts: Secondary | ICD-10-CM

## 2021-09-16 DIAGNOSIS — I9589 Other hypotension: Secondary | ICD-10-CM | POA: Diagnosis not present

## 2021-09-16 DIAGNOSIS — R5383 Other fatigue: Secondary | ICD-10-CM

## 2021-09-16 DIAGNOSIS — R197 Diarrhea, unspecified: Secondary | ICD-10-CM

## 2021-09-16 DIAGNOSIS — C3412 Malignant neoplasm of upper lobe, left bronchus or lung: Secondary | ICD-10-CM | POA: Diagnosis not present

## 2021-09-16 DIAGNOSIS — Z510 Encounter for antineoplastic radiation therapy: Secondary | ICD-10-CM | POA: Diagnosis not present

## 2021-09-16 LAB — COMPREHENSIVE METABOLIC PANEL
ALT: 15 U/L (ref 0–44)
AST: 19 U/L (ref 15–41)
Albumin: 3.4 g/dL — ABNORMAL LOW (ref 3.5–5.0)
Alkaline Phosphatase: 89 U/L (ref 38–126)
Anion gap: 6 (ref 5–15)
BUN: 40 mg/dL — ABNORMAL HIGH (ref 8–23)
CO2: 22 mmol/L (ref 22–32)
Calcium: 8.6 mg/dL — ABNORMAL LOW (ref 8.9–10.3)
Chloride: 104 mmol/L (ref 98–111)
Creatinine, Ser: 1.37 mg/dL — ABNORMAL HIGH (ref 0.61–1.24)
GFR, Estimated: 55 mL/min — ABNORMAL LOW (ref >60)
Glucose, Bld: 102 mg/dL — ABNORMAL HIGH (ref 70–99)
Potassium: 4.7 mmol/L (ref 3.5–5.1)
Sodium: 132 mmol/L — ABNORMAL LOW (ref 135–145)
Total Bilirubin: 0.6 mg/dL (ref 0.3–1.2)
Total Protein: 6.9 g/dL (ref 6.5–8.1)

## 2021-09-16 LAB — CBC WITH DIFFERENTIAL/PLATELET
Abs Immature Granulocytes: 0.02 10*3/uL (ref 0.00–0.07)
Basophils Absolute: 0 10*3/uL (ref 0.0–0.1)
Basophils Relative: 0 %
Eosinophils Absolute: 0 10*3/uL (ref 0.0–0.5)
Eosinophils Relative: 1 %
HCT: 27 % — ABNORMAL LOW (ref 39.0–52.0)
Hemoglobin: 8.7 g/dL — ABNORMAL LOW (ref 13.0–17.0)
Immature Granulocytes: 1 %
Lymphocytes Relative: 14 %
Lymphs Abs: 0.3 10*3/uL — ABNORMAL LOW (ref 0.7–4.0)
MCH: 28.9 pg (ref 26.0–34.0)
MCHC: 32.2 g/dL (ref 30.0–36.0)
MCV: 89.7 fL (ref 80.0–100.0)
Monocytes Absolute: 0.2 10*3/uL (ref 0.1–1.0)
Monocytes Relative: 12 %
Neutro Abs: 1.4 10*3/uL — ABNORMAL LOW (ref 1.7–7.7)
Neutrophils Relative %: 72 %
Platelets: 122 10*3/uL — ABNORMAL LOW (ref 150–400)
RBC: 3.01 MIL/uL — ABNORMAL LOW (ref 4.22–5.81)
RDW: 16 % — ABNORMAL HIGH (ref 11.5–15.5)
WBC: 1.9 10*3/uL — ABNORMAL LOW (ref 4.0–10.5)
nRBC: 0 % (ref 0.0–0.2)

## 2021-09-16 LAB — C DIFFICILE QUICK SCREEN W PCR REFLEX
C Diff antigen: NEGATIVE
C Diff interpretation: NOT DETECTED
C Diff toxin: NEGATIVE

## 2021-09-16 MED ORDER — SODIUM CHLORIDE 0.9 % IV SOLN
INTRAVENOUS | Status: DC
  Filled 2021-09-16 (×2): qty 250

## 2021-09-16 MED ORDER — HEPARIN SOD (PORK) LOCK FLUSH 100 UNIT/ML IV SOLN
500.0000 [IU] | Freq: Once | INTRAVENOUS | Status: AC
  Administered 2021-09-16: 500 [IU] via INTRAVENOUS
  Filled 2021-09-16: qty 5

## 2021-09-16 MED ORDER — SODIUM CHLORIDE 0.9 % IV SOLN
10.0000 mg | Freq: Once | INTRAVENOUS | Status: AC
  Administered 2021-09-16: 10 mg via INTRAVENOUS
  Filled 2021-09-16: qty 10

## 2021-09-16 NOTE — Patient Instructions (Signed)
[facility] [place]  Discharge Instructions: Thank you for choosing [facility] to provide your oncology and hematology care.  If you have a lab appointment with the [facility], please go directly to the [facility] and check in at the registration area.  Wear comfortable clothing and clothing appropriate for easy access to any Portacath or PICC line.   We strive to give you quality time with your provider. You may need to reschedule your appointment if you arrive late (15 or more minutes).  Arriving late affects you and other patients whose appointments are after yours.  Also, if you miss three or more appointments without notifying the office, you may be dismissed from the clinic at the providers discretion.      For prescription refill requests, have your pharmacy contact our office and allow 72 hours for refills to be completed.    Today you received the following chemotherapy and/or immunotherapy agents HYDRATION and DECADRON      To help prevent nausea and vomiting after your treatment, we encourage you to take your nausea medication as directed.  BELOW ARE SYMPTOMS THAT SHOULD BE REPORTED IMMEDIATELY: *FEVER GREATER THAN 100.4 F (38 C) OR HIGHER *CHILLS OR SWEATING *NAUSEA AND VOMITING THAT IS NOT CONTROLLED WITH YOUR NAUSEA MEDICATION *UNUSUAL SHORTNESS OF BREATH *UNUSUAL BRUISING OR BLEEDING *URINARY PROBLEMS (pain or burning when urinating, or frequent urination) *BOWEL PROBLEMS (unusual diarrhea, constipation, pain near the anus) TENDERNESS IN MOUTH AND THROAT WITH OR WITHOUT PRESENCE OF ULCERS (sore throat, sores in mouth, or a toothache) UNUSUAL RASH, SWELLING OR PAIN  UNUSUAL VAGINAL DISCHARGE OR ITCHING   Items with * indicate a potential emergency and should be followed up as soon as possible or go to the Emergency Department if any problems should occur.  Please show the CHEMOTHERAPY ALERT CARD or IMMUNOTHERAPY ALERT CARD at  check-in to the Emergency Department and triage nurse.  Should you have questions after your visit or need to cancel or reschedule your appointment, please contact [facility] [place] [facility] [place]  [phone] and follow the prompts.  Office hours are 8:00 a.m. to 4:30 p.m. Monday - Friday. Please note that voicemails left after 4:00 p.m. may not be returned until the following business day.  We are closed weekends and major holidays. You have access to a nurse at all times for urgent questions. Please call the main number to the clinic [phone] and follow the prompts.  For any non-urgent questions, you may also contact your provider using MyChart. We now offer e-Visits for anyone 7 and older to request care online for non-urgent symptoms. For details visit [email or web address].   Also download the MyChart app! Go to the app store, search "MyChart", open the app, select [facility], and log in with your MyChart username and password.  Due to Covid, a mask is required upon entering the hospital/clinic. If you do not have a mask, one will be given to you upon arrival. For doctor visits, patients may have 1 support person aged 18 or older with them. For treatment visits, patients cannot have anyone with them due to current Covid guidelines and our immunocompromised population.   Dehydration, Adult Dehydration is condition in which there is not enough water or other fluids in the body. This happens when a person loses more fluids than he or she takes in. Important body parts cannot work right without the right amount of fluids. Any loss of fluids from the body can cause  dehydration. Dehydration can be mild, worse, or very bad. It should be treated right away to keep it from getting very bad. What are the causes? This condition may be caused by: Conditions that cause loss of water or other fluids, such as: Watery poop (diarrhea). Vomiting. Sweating a lot. Peeing (urinating) a  lot. Not drinking enough fluids, especially when you: Are ill. Are doing things that take a lot of energy to do. Other illnesses and conditions, such as fever or infection. Certain medicines, such as medicines that take extra fluid out of the body (diuretics). Lack of safe drinking water. Not being able to get enough water and food. What increases the risk? The following factors may make you more likely to develop this condition: Having a long-term (chronic) illness that has not been treated the right way, such as: Diabetes. Heart disease. Kidney disease. Being 36 years of age or older. Having a disability. Living in a place that is high above the ground or sea (high in altitude). The thinner, dried air causes more fluid loss. Doing exercises that put stress on your body for a long time. What are the signs or symptoms? Symptoms of dehydration depend on how bad it is. Mild or worse dehydration Thirst. Dry lips or dry mouth. Feeling dizzy or light-headed, especially when you stand up from sitting. Muscle cramps. Your body making: Dark pee (urine). Pee may be the color of tea. Less pee than normal. Less tears than normal. Headache. Very bad dehydration Changes in skin. Skin may: Be cold to the touch (clammy). Be blotchy or pale. Not go back to normal right after you lightly pinch it and let it go. Little or no tears, pee, or sweat. Changes in vital signs, such as: Fast breathing. Low blood pressure. Weak pulse. Pulse that is more than 100 beats a minute when you are sitting still. Other changes, such as: Feeling very thirsty. Eyes that look hollow (sunken). Cold hands and feet. Being mixed up (confused). Being very tired (lethargic) or having trouble waking from sleep. Short-term weight loss. Loss of consciousness. How is this treated? Treatment for this condition depends on how bad it is. Treatment should start right away. Do not wait until your condition gets very  bad. Very bad dehydration is an emergency. You will need to go to a hospital. Mild or worse dehydration can be treated at home. You may be asked to: Drink more fluids. Drink an oral rehydration solution (ORS). This drink helps get the right amounts of fluids and salts and minerals in the blood (electrolytes). Very bad dehydration can be treated: With fluids through an IV tube. By getting normal levels of salts and minerals in your blood. This is often done by giving salts and minerals through a tube. The tube is passed through your nose and into your stomach. By treating the root cause. Follow these instructions at home: Oral rehydration solution If told by your doctor, drink an ORS: Make an ORS. Use instructions on the package. Start by drinking small amounts, about  cup (120 mL) every 5-10 minutes. Slowly drink more until you have had the amount that your doctor said to have. Eating and drinking     Drink enough clear fluid to keep your pee pale yellow. If you were told to drink an ORS, finish the ORS first. Then, start slowly drinking other clear fluids. Drink fluids such as: Water. Do not drink only water. Doing that can make the salt (sodium) level in your body get  too low. Water from ice chips you suck on. Fruit juice that you have added water to (diluted). Low-calorie sports drinks. Eat foods that have the right amounts of salts and minerals, such as: Bananas. Oranges. Potatoes. Tomatoes. Spinach. Do not drink alcohol. Avoid: Drinks that have a lot of sugar. These include: High-calorie sports drinks. Fruit juice that you did not add water to. Soda. Caffeine. Foods that are greasy or have a lot of fat or sugar. General instructions Take over-the-counter and prescription medicines only as told by your doctor. Do not take salt tablets. Doing that can make the salt level in your body get too high. Return to your normal activities as told by your doctor. Ask your doctor what  activities are safe for you. Keep all follow-up visits as told by your doctor. This is important. Contact a doctor if: You have pain in your belly (abdomen) and the pain: Gets worse. Stays in one place. You have a rash. You have a stiff neck. You get angry or annoyed (irritable) more easily than normal. You are more tired or have a harder time waking than normal. You feel: Weak or dizzy. Very thirsty. Get help right away if you have: Any symptoms of very bad dehydration. Symptoms of vomiting, such as: You cannot eat or drink without vomiting. Your vomiting gets worse or does not go away. Your vomit has blood or green stuff in it. Symptoms that get worse with treatment. A fever. A very bad headache. Problems with peeing or pooping (having a bowel movement), such as: Watery poop that gets worse or does not go away. Blood in your poop (stool). This may cause poop to look black and tarry. Not peeing in 6-8 hours. Peeing only a small amount of very dark pee in 6-8 hours. Trouble breathing. These symptoms may be an emergency. Do not wait to see if the symptoms will go away. Get medical help right away. Call your local emergency services (911 in the [place] Do not drive yourself to the hospital. Summary Dehydration is a condition in which there is not enough water or other fluids in the body. This happens when a person loses more fluids than he or she takes in. Treatment for this condition depends on how bad it is. Treatment should be started right away. Do not wait until your condition gets very bad. Drink enough clear fluid to keep your pee pale yellow. If you were told to drink an oral rehydration solution (ORS), finish the ORS first. Then, start slowly drinking other clear fluids. Take over-the-counter and prescription medicines only as told by your doctor. Get help right away if you have any symptoms of very bad dehydration. This information is not intended to replace advice given to  you by your health care provider. Make sure you discuss any questions you have with your health care provider. Document Revised: 02/08/2019 Document Reviewed: 02/08/2019 Elsevier Patient Education  [place].   Dexamethasone injection What is this medication? DEXAMETHASONE (dex a METH a sone) is a corticosteroid. It is used to treat inflammation of the skin, joints, lungs, and other organs. Common conditions treated include asthma, allergies, and arthritis. It is also used for other conditions, like blood disorders and diseases of the adrenal glands. This medicine may be used for other purposes; ask your health care provider or pharmacist if you have questions. COMMON BRAND NAME(S): Decadron, DoubleDex, ReadySharp Dexamethasone, Simplist Dexamethasone, Solurex What should I tell my care team before I take this medication? They  need to know if you have any of these conditions: Cushing's syndrome diabetes glaucoma heart disease high blood pressure infection like herpes, measles, tuberculosis, or chickenpox kidney disease liver disease mental illness myasthenia gravis osteoporosis previous heart attack seizures stomach or intestine problems thyroid disease an unusual or allergic reaction to dexamethasone, corticosteroids, other medicines, lactose, foods, dyes, or preservatives pregnant or trying to get pregnant breast-feeding How should I use this medication? This medicine is for injection into a muscle, joint, lesion, soft tissue, or vein. It is given by a health care professional in a hospital or clinic setting. Talk to your pediatrician regarding the use of this medicine in children. Special care may be needed. Overdosage: If you think you have taken too much of this medicine contact a poison control center or emergency room at once. NOTE: This medicine is only for you. Do not share this medicine with others. What if I miss a dose? This may not apply. If you are having a  series of injections over a prolonged period, try not to miss an appointment. Call your doctor or health care professional to reschedule if you are unable to keep an appointment. What may interact with this medication? Do not take this medicine with any of the following medications: live virus vaccines This medicine may also interact with the following medications: aminoglutethimide amphotericin B aspirin and aspirin-like medicines certain antibiotics like erythromycin, clarithromycin, and troleandomycin certain antivirals for HIV or hepatitis certain medicines for seizures like carbamazepine, phenobarbital, phenytoin certain medicines to treat myasthenia gravis cholestyramine cyclosporine digoxin diuretics ephedrine male hormones, like estrogen or progestins and birth control pills insulin or other medicines for diabetes isoniazid ketoconazole medicines that relax muscles for surgery mifepristone NSAIDs, medicines for pain and inflammation, like ibuprofen or naproxen rifampin skin tests for allergies thalidomide vaccines warfarin This list may not describe all possible interactions. Give your health care provider a list of all the medicines, herbs, non-prescription drugs, or dietary supplements you use. Also tell them if you smoke, drink alcohol, or use illegal drugs. Some items may interact with your medicine. What should I watch for while using this medication? Visit your health care professional for regular checks on your progress. Tell your health care professional if your symptoms do not start to get better or if they get worse. Your condition will be monitored carefully while you are receiving this medicine. Wear a medical ID bracelet or chain. Carry a card that describes your disease and details of your medicine and dosage times. This medicine may increase your risk of getting an infection. Call your health care professional for advice if you get a fever, chills, or sore  throat, or other symptoms of a cold or flu. Do not treat yourself. Try to avoid being around people who are sick. Call your health care professional if you are around anyone with measles, chickenpox, or if you develop sores or blisters that do not heal properly. If you are going to need surgery or other procedures, tell your doctor or health care professional that you have taken this medicine within the last 12 months. Ask your doctor or health care professional about your diet. You may need to lower the amount of salt you eat. This medicine may increase blood sugar. Ask your healthcare provider if changes in diet or medicines are needed if you have diabetes. What side effects may I notice from receiving this medication? Side effects that you should report to your doctor or health care professional as soon as  possible: allergic reactions like skin rash, itching or hives, swelling of the face, lips, or tongue bloody or black, tarry stools changes in emotions or moods changes in vision confusion, excitement, restlessness depressed mood eye pain hallucinations muscle weakness severe or sudden stomach or belly pain signs and symptoms of high blood sugar such as being more thirsty or hungry or having to urinate more than normal. You may also feel very tired or have blurry vision. signs and symptoms of infection like fever; chills; cough; sore throat; pain or trouble passing urine swelling of ankles, feet unusual bruising or bleeding wounds that do not heal Side effects that usually do not require medical attention (report to your doctor or health care professional if they continue or are bothersome): increased appetite increased growth of face or body hair headache nausea, vomiting pain, redness, or irritation at site where injected skin problems, acne, thin and shiny skin trouble sleeping weight gain This list may not describe all possible side effects. Call your doctor for medical advice  about side effects. You may report side effects to FDA at 1-800-FDA-1088. Where should I keep my medication? This medicine is given in a hospital or clinic and will not be stored at home. NOTE: This sheet is a summary. It may not cover all possible information. If you have questions about this medicine, talk to your doctor, pharmacist, or health care provider.  2022 Elsevier/[employer] Standard (2019-01-11 00:00:00)

## 2021-09-16 NOTE — Progress Notes (Signed)
[facility] the [facility]. They would like to speak to Mr. [facility] nurse navigator.  I updated her to call the office to reach her.  They will call the office  ?

## 2021-09-16 NOTE — Progress Notes (Signed)
Hematology/Oncology Consult note [facility]  Telephone:([phone] [phone]  Patient Care Team: Pcp, No as PCP - General Kate Sable, MD as PCP - Cardiology (Cardiology) Telford Nab, RN as Oncology Nurse Navigator Noreene Filbert, MD as Referring Physician (Radiation Oncology) Sindy Guadeloupe, MD as Consulting Physician (Oncology)   Name of the patient: Richard Wiggins  [id number]  30-Sep-1950   Date of visit: 09/16/21  Diagnosis- history of stage I lung cancer s/p SBRT now with hilar recurrence  Chief complaint/ Reason for visit-on treatment assessment prior to cycle 3 of weekly CarboTaxol chemotherapy  Heme/Onc history: Patient is a 71 year old male who was a former smoker and quit in 2018.  He went to the ER in July 2021 for possible strokelike symptoms and was found to have a left upper lobe lung nodule which was followed up with a CT chest which showed a 1.2 x 1.1 cm left upper lobe spiculated nodule.  This was followed by a PET scan which showed that nodule was hypermetabolic with an SUV of 5.63 patient was also incidentally noted to have a 4.3 cm abdominal aortic aneurysm.  He follows up with Dr. Lucky Cowboy for his aneurysm.  He was seen by Dr. Patsey Berthold and underwent ENB.  Biopsy was nondiagnostic.  However given that the nodule was hypermetabolic it was concerning for malignancy and recommendation per Dr. Patsey Berthold was empiric radiation. Patient underwent SBRT to his left upper lobe by Dr. Baruch Gouty   Patient also has a positive fit test.  Colonoscopy in January 2022 showed no evidence of colon cancer.  There wereCouple of polyps which were resected and were consistent with tubular adenoma   Patient noted to have enlarging left hilar lymph node which was PET positiveIn January 2023.  He had EBUS guided bronchoscopy with Dr. Patsey Berthold which was consistent with non-small cell lung cancer  Due to cost concerns patient received 2 weeks of  palliative radiation treatment and has received 2 cycles of weekly CarboTaxol chemotherapy so far  Interval history-patient was significantly hypotensive yesterday with a systolic blood pressure in the 70s and required IV fluids.  His systolic blood pressure is better today in the 90s.  At baseline his blood pressure runs in the 130s.  His spironolactone dose was cut down by cardiology as well.  Reports ongoing fatigue.  Denies other complaints  ECOG PS- 1 Pain scale- 0  Review of systems- Review of Systems  Constitutional:  Positive for malaise/fatigue. Negative for chills, fever and weight loss.  HENT:  Negative for congestion, ear discharge and nosebleeds.   Eyes:  Negative for blurred vision.  Respiratory:  Negative for cough, hemoptysis, sputum production, shortness of breath and wheezing.   Cardiovascular:  Negative for chest pain, palpitations, orthopnea and claudication.  Gastrointestinal:  Negative for abdominal pain, blood in stool, constipation, diarrhea, heartburn, melena, nausea and vomiting.  Genitourinary:  Negative for dysuria, flank pain, frequency, hematuria and urgency.  Musculoskeletal:  Negative for back pain, joint pain and myalgias.  Skin:  Negative for rash.  Neurological:  Negative for dizziness, tingling, focal weakness, seizures, weakness and headaches.  Endo/Heme/Allergies:  Does not bruise/bleed easily.  Psychiatric/Behavioral:  Negative for depression and suicidal ideas. The patient does not have insomnia.      Allergies  Allergen Reactions   Tape Rash    plastic   Wound Dressing Adhesive Itching and Rash    Gets stuck to skin, makes wound spread     Past Medical History:  Diagnosis Date   AAA (abdominal aortic aneurysm) 2020   a. 2020 U/S: 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 -plan medical managmment with tight bp control; b. 09/2020 Abd U/S: 4.1cm.   Anxiety    Aortic atherosclerosis (HCC)    Benign essential HTN 02/10/2020   BPH (benign prostatic  hyperplasia) 02/09/2020   Brain aneurysm    Cancer of upper lobe of left lung ([place]) 04/2020   a.) ENB/EBUS performed; Bx non-diagnostic. b.) presumed stage I NSCLC in the LUL; underwent SBRT (60 Gy over 5 fractions)   Carotid arterial disease ([place])    a. 05/2017 s/p R carotid endarterectomy following CVA.   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 2018 Echo: EF 30-35%; b. 02/2021 Echo: EF 55-60%, nl RV fxn. Triv MR. Asc Ao 63mm.   CKD (chronic kidney disease), stage III (HCC)    Claudication ([place])    a. 09/2020 ABIs: R 0.91. L 0.99.   COPD (chronic obstructive pulmonary disease) (HCC)    Coronary artery disease    a. 06/2017 PCI ([place]): s/p PCI to the RCA. LAD 50, LCX 20.   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 02/2021 Asc Ao 4mm.   Emphysema of lung (HCC)    HLD (hyperlipidemia)    IDA (iron deficiency anemia) 02/09/2020   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   Ischemic cardiomyopathy    Long term current use of anticoagulant    a.) apixaban   Marijuana use, continuous 02/10/2020   Osteoporosis    PAF (paroxysmal atrial fibrillation) ([place])    a. 05/2017 Dx in setting of CVA. CHA2DS2VASc = 6-->Eliquis.   Pre-diabetes    Pulmonary emboli ([place]) 06/07/2017   Right lower lobe pulmonary embolism small segmental, multifocal multifocal pneumonia, mediastinal lymphadenopathy, moderate centrilobular emphysema   Seizures ([place])    Sigmoid diverticulosis    a.) CT 01/27/2021: severe   Stroke [facility])    a. 05/2017 - hospitalized in [place]->prolonged hospitalization in setting of R CEA, PE, and finding of RCA dzs on cath; b. Residual left sided weakness to arm and leg.     Past Surgical History:  Procedure Laterality Date   brain aneurysm with clip     COLONOSCOPY WITH PROPOFOL N/A 07/23/2020   Procedure: COLONOSCOPY WITH PROPOFOL;  Surgeon: Lin Landsman, MD;  [facility] ENDOSCOPY;  Service: Gastroenterology;   Laterality: N/A;   CORONARY ANGIOPLASTY WITH STENT PLACEMENT  07/08/2017   PORTA CATH INSERTION N/A 08/31/2021   Procedure: PORTA CATH INSERTION;  Surgeon: Algernon Huxley, MD;  Location: [place] CV LAB;  Service: Cardiovascular;  Laterality: N/A;   TOTAL HIP ARTHROPLASTY Right    VIDEO BRONCHOSCOPY WITH ENDOBRONCHIAL NAVIGATION N/A 04/23/2020   Procedure: VIDEO BRONCHOSCOPY WITH ENDOBRONCHIAL NAVIGATION;  Surgeon: Tyler Pita, MD;  Location: [facility] ORS;  Service: Pulmonary;  Laterality: N/A;   VIDEO BRONCHOSCOPY WITH ENDOBRONCHIAL ULTRASOUND Left 08/12/2021   Procedure: VIDEO BRONCHOSCOPY WITH ENDOBRONCHIAL ULTRASOUND;  Surgeon: Tyler Pita, MD;  Location: [facility] ORS;  Service: Cardiopulmonary;  Laterality: Left;    Social History   Socioeconomic History   Marital status: Married    Spouse name: Butch Penny   Number of children: Not on file   Years of education: Not on file   Highest education level: High school graduate  Occupational History   Occupation: Retired  Tobacco Use   Smoking status: Former    Packs/day: 2.00    Years:  53.00    Pack years: 106.00    Types: Cigarettes    Quit date: 05/28/2017    Years since quitting: 4.3   Smokeless tobacco: Never   Tobacco comments:    Quit in 2018  Vaping Use   Vaping Use: Never used  Substance and Sexual Activity   Alcohol use: Not Currently    Comment: socially drink cocktail   Drug use: Not Currently    Types: Marijuana    Comment: last smoke x1 month ago   Sexual activity: Not Currently  Other Topics Concern   Not on file  Social History Narrative   Lives with wife. 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 Children x2 children and grandchildren grown.    Social Determinants of Health   Financial Resource Strain: Low Risk    Difficulty of Paying Living Expenses: Not hard at all  Food Insecurity: No Food Insecurity   Worried About Charity fundraiser in the Last Year: Never true   [place] in the  Last Year: Never true  Transportation Needs: No Transportation Needs   Lack of Transportation (Medical): No   Lack of Transportation (Non-Medical): No  Physical Activity: Inactive   Days of Exercise per Week: 0 days   Minutes of Exercise per Session: 0 min  Stress: Stress Concern Present   Feeling of Stress : Rather much  Social Connections: Moderately Isolated   Frequency of Communication with Friends and Family: Never   Frequency of Social Gatherings with Friends and Family: Never   Attends Religious Services: Never   Marine scientist or Organizations: Yes   Attends Archivist Meetings: Never   Marital Status: Married  Human resources officer Violence: Not At Risk   Fear of Current or Ex-Partner: No   Emotionally Abused: No   Physically Abused: No   Sexually Abused: No    Family History  Problem Relation Age of Onset   Diabetes Mother    Heart disease Mother    Stroke Father    Heart disease Father    Heart attack Father    Alcohol abuse Father    Heart disease Sister    Heart attack Sister    Heart disease Maternal Grandmother    Heart attack Maternal Grandmother    Heart attack Paternal Grandmother    Diabetes Brother    Heart disease Brother    Heart attack Brother      Current Outpatient Medications:    albuterol (PROVENTIL) (2.5 MG/3ML) 0.083% nebulizer solution, Take 3 mLs (2.5 mg total) by nebulization every 6 (six) hours as needed for wheezing or shortness of breath., Disp: 75 mL, Rfl: 6   albuterol (VENTOLIN HFA) 108 (90 Base) MCG/ACT inhaler, Inhale 2 puffs into the lungs every 6 (six) hours as needed for wheezing or shortness of breath., Disp: 8 g, Rfl: 2   apixaban (ELIQUIS) 5 MG TABS tablet, TAKE 1 TABLET(5 MG) BY MOUTH IN THE MORNING AND AT BEDTIME, Disp: 60 tablet, Rfl: 3   aspirin 81 MG chewable tablet, CHEW ONE TABLET BY MOUTH DAILY FOR HEART, Disp: , Rfl:    atorvastatin (LIPITOR) 40 MG tablet, TAKE 1 TABLET(40 MG) BY MOUTH AT BEDTIME, Disp:  90 tablet, Rfl: 3   atorvastatin (LIPITOR) 40 MG tablet, TAKE ONE TABLET BY MOUTH AT BEDTIME FOR CHOLESTEROL, Disp: , Rfl:    baclofen (LIORESAL) 10 MG tablet, Take 1 tablet (10 mg total) by mouth 3 (three) times daily as needed for muscle  spasms., Disp: 30 each, Rfl: 1   Budeson-Glycopyrrol-Formoterol (BREZTRI AEROSPHERE) 160-9-4.8 MCG/ACT AERO, Inhale 2 puffs into the lungs in the morning and at bedtime., Disp: 10.7 g, Rfl: 11   carvedilol (COREG) 25 MG tablet, Take 0.5 tablets (12.5 mg total) by mouth in the morning and at bedtime., Disp: 60 tablet, Rfl: 3   dexamethasone (DECADRON) 4 MG tablet, Take 2 tablets (8 mg total) by mouth daily. Start the day after chemotherapy for 2 days., Disp: 30 tablet, Rfl: 1   lamoTRIgine (LAMICTAL) 100 MG tablet, Take 100 mg by mouth 2 (two) times daily., Disp: , Rfl:    lidocaine-prilocaine (EMLA) cream, Apply to affected area once, Disp: 30 g, Rfl: 3   LORazepam (ATIVAN) 0.5 MG tablet, Take 1 tablet (0.5 mg total) by mouth every 6 (six) hours as needed (Nausea or vomiting)., Disp: 30 tablet, Rfl: 0   Multiple Vitamin (MULTI-VITAMIN) tablet, Take 1 tablet by mouth daily., Disp: , Rfl:    ondansetron (ZOFRAN) 8 MG tablet, Take 1 tablet (8 mg total) by mouth 2 (two) times daily as needed for refractory nausea / vomiting. Start on day 3 after chemo., Disp: 30 tablet, Rfl: 1   prochlorperazine (COMPAZINE) 10 MG tablet, Take 1 tablet (10 mg total) by mouth every 6 (six) hours as needed (Nausea or vomiting)., Disp: 30 tablet, Rfl: 1   sacubitril-valsartan (ENTRESTO) 49-51 MG, Take 0.5 tablets by mouth in the morning and at bedtime., Disp: , Rfl:    sucralfate (CARAFATE) 1 g tablet, Take 1 tablet (1 g total) by mouth 3 (three) times daily. Dissolve in 3-4 tbsp warm water, swish and swallow., Disp: 90 tablet, Rfl: 0   tamsulosin (FLOMAX) 0.4 MG CAPS capsule, Take 1 capsule (0.4 mg total) by mouth daily., Disp: 90 capsule, Rfl: 0   aspirin EC 81 MG tablet, Take 81 mg by  mouth daily. Swallow whole., Disp: , Rfl:    furosemide (LASIX) 40 MG tablet, Take 1 tablet (40 mg total) by mouth as needed. (Patient not taking: Reported on 09/16/2021), Disp: 90 tablet, Rfl: 2   Lidocaine HCl-Benzyl Alcohol (SALONPAS LIDOCAINE PLUS EX), Place 1 patch onto the skin daily as needed (pain.). (Patient not taking: Reported on 09/16/2021), Disp: , Rfl:    spironolactone (ALDACTONE) 25 MG tablet, TAKE ONE TABLET BY MOUTH DAILY FOR HYPERTENSION (Patient not taking: Reported on 09/16/2021), Disp: , Rfl:  No current facility-administered medications for this visit.  Facility-Administered Medications Ordered in Other Visits:    0.9 %  sodium chloride infusion, , Intravenous, Continuous, Sindy Guadeloupe, MD, Stopped at 09/16/21 1151  Physical exam:  Vitals:   09/16/21 0947  BP: 93/63  Pulse: 67  Resp: 18  Temp: (!) 97.2 F (36.2 C)  SpO2: 100%  Weight: 191 lb (86.6 kg)   Physical Exam Constitutional:      General: He is not in acute distress. Cardiovascular:     Rate and Rhythm: Normal rate and regular rhythm.     Heart sounds: Normal heart sounds.  Pulmonary:     Effort: Pulmonary effort is normal.     Breath sounds: Normal breath sounds.  Abdominal:     General: Bowel sounds are normal.     Palpations: Abdomen is soft.  Skin:    General: Skin is warm and dry.  Neurological:     Mental Status: He is alert and oriented to person, place, and time.     CMP Latest Ref Rng & Units 09/16/2021  Glucose 70 -  99 mg/dL 102(H)  BUN 8 - 23 mg/dL 40(H)  Creatinine 0.61 - 1.24 mg/dL 1.37(H)  Sodium 135 - 145 mmol/L 132(L)  Potassium 3.5 - 5.1 mmol/L 4.7  Chloride 98 - 111 mmol/L 104  CO2 22 - 32 mmol/L 22  Calcium 8.9 - 10.3 mg/dL 8.6(L)  Total Protein 6.5 - 8.1 g/dL 6.9  Total Bilirubin 0.3 - 1.2 mg/dL 0.6  Alkaline Phos 38 - 126 U/L 89  AST 15 - 41 U/L 19  ALT 0 - 44 U/L 15   CBC Latest Ref Rng & Units 09/16/2021  WBC 4.0 - 10.5 K/uL 1.9(L)  Hemoglobin 13.0 - 17.0 g/dL 8.7(L)   Hematocrit 39.0 - 52.0 % 27.0(L)  Platelets 150 - 400 K/uL 122(L)    No images are attached to the encounter.  PERIPHERAL VASCULAR CATHETERIZATION  Result Date: 08/31/2021 See surgical note for result.    Assessment and plan- Patient is a 71 y.o. male with history of stage I left upper lobe lung cancer and 2021 s/p SBRT now with nodal recurrence.  He is s/p 2 cycles of weekly CarboTaxol chemotherapy with radiation treatment  Patient completes his 2 weeks of palliative radiation treatment tomorrow.  He has only received 2 cycles of weekly CarboTaxol and radiation so far.  I will plan to give him CarboTaxol chemotherapy every 3 weeks for 2 more doses before getting repeat scans and considering maintenance immunotherapy.  However patient had significant hypotension yesterday and we are giving him 1 more liter of IV fluids today.  Moreover his white cell count is 1.9 today with an ANC of 1.4. I will therefore give him a break from chemotherapy for 2 weeks and see him tentatively in 2 weeks time with port labs CBC with differential and CMP for CarboTaxol chemotherapy.  Carboplatin will be given at an AUC of 4 along with Taxol at 150 mg per metered square  Patient does have baseline normocytic anemia likely secondary to kidney disease as well.  However since we are treating his malignancy with a curative intent I am holding off on starting EPO for the same.   Visit Diagnosis 1. Other specified hypotension   2. Recurrent non-small cell lung cancer ([place])      Dr. Randa Evens, MD, MPH [facility] at [facility] [phone] 09/16/2021 1:02 PM

## 2021-09-16 NOTE — Progress Notes (Signed)
Nutrition Assessment: ? ?Referral for poor appetite, weight loss ? ?71 year old male with recurrent lung cancer.  Past medical history of HLD, seizures, HTN, CAD, CKD, COPD, stroke.  Patient receiving radiation (last treatment today) and carbo/taxol.   ? ?Met with patient during fluids, chemo on hold.  Patient reports that his appetite has been poor for the past 4-5 days.  Says that taste is "off".  Patient also having nausea and diarrhea.  Has been taking water, ramen noodles, boost shakes.   ? ?Patient asked RD to call wife.   ? ?Spoke with wife.  She says she feels like diarrhea stemmed from patient taking too much miralax due to his fear of having constipation.   ? ? ? ?Medications: MVI, zofran, compazine, carafate, lasix.  dexamethasone ? ?Labs: Na 132, glucose 117, BUN 46, creatinine 1.66, Ca 8.7 ? ?Anthropometrics:  ? ?Height: 70 inches ?Weight: 191 lb ?196 lb on 1/17 ?BMI: 27 ? ?3% weight loss in the last 1.5 months ? ? ?Estimated Energy Needs ? ?Kcals: 2150-2580 ?Protein: 108-129 g ?Fluid: > 2.1 L ? ?NUTRITION DIAGNOSIS: Inadequate oral intake related to cancer related treatment side effects as evidenced by 3% weight loss in 1.5 months and poor po intake ? ? ?INTERVENTION:  ?Discussed food choices with diarrhea and nausea with patient and wife via phone.  Handout provided.   ?Encouraged trying to sip or take bite q hour ?Encouraged taking nausea medication ?Encouraged patient to call clinic if diarrhea is not better.  ?Contact information provided ? ?  ? ?MONITORING, EVALUATION, GOAL: weight trends, intake ? ? ?NEXT VISIT: Wednesday, March 22 during infusion ? ?Maryuri Warnke B. Zenia Resides, RD, LDN ?Registered Dietitian ?[phone] (mobile) ? ? ?

## 2021-09-17 ENCOUNTER — Ambulatory Visit: Payer: No Typology Code available for payment source

## 2021-09-18 ENCOUNTER — Other Ambulatory Visit: Payer: Self-pay

## 2021-09-18 ENCOUNTER — Ambulatory Visit: Payer: No Typology Code available for payment source

## 2021-09-18 ENCOUNTER — Inpatient Hospital Stay: Payer: No Typology Code available for payment source

## 2021-09-18 ENCOUNTER — Emergency Department
Admission: EM | Admit: 2021-09-18 | Discharge: 2021-09-19 | Disposition: A | Discharge status: Home / Self Care | Payer: No Typology Code available for payment source | Attending: Emergency Medicine | Admitting: Emergency Medicine

## 2021-09-18 ENCOUNTER — Emergency Department: Payer: No Typology Code available for payment source

## 2021-09-18 VITALS — BP 90/53 | HR 71 | Temp 97.3°F | Resp 18

## 2021-09-18 DIAGNOSIS — R197 Diarrhea, unspecified: Secondary | ICD-10-CM | POA: Diagnosis not present

## 2021-09-18 DIAGNOSIS — R509 Fever, unspecified: Secondary | ICD-10-CM | POA: Diagnosis not present

## 2021-09-18 DIAGNOSIS — Z20822 Contact with and (suspected) exposure to covid-19: Secondary | ICD-10-CM | POA: Diagnosis not present

## 2021-09-18 DIAGNOSIS — I130 Hypertensive heart and chronic kidney disease with heart failure and stage 1 through stage 4 chronic kidney disease, or unspecified chronic kidney disease: Secondary | ICD-10-CM | POA: Diagnosis not present

## 2021-09-18 DIAGNOSIS — Z7982 Long term (current) use of aspirin: Secondary | ICD-10-CM | POA: Insufficient documentation

## 2021-09-18 DIAGNOSIS — R112 Nausea with vomiting, unspecified: Secondary | ICD-10-CM

## 2021-09-18 DIAGNOSIS — I7143 Infrarenal abdominal aortic aneurysm, without rupture: Secondary | ICD-10-CM | POA: Insufficient documentation

## 2021-09-18 DIAGNOSIS — N183 Chronic kidney disease, stage 3 unspecified: Secondary | ICD-10-CM | POA: Diagnosis not present

## 2021-09-18 DIAGNOSIS — I2510 Atherosclerotic heart disease of native coronary artery without angina pectoris: Secondary | ICD-10-CM | POA: Diagnosis not present

## 2021-09-18 DIAGNOSIS — J449 Chronic obstructive pulmonary disease, unspecified: Secondary | ICD-10-CM | POA: Insufficient documentation

## 2021-09-18 DIAGNOSIS — I5022 Chronic systolic (congestive) heart failure: Secondary | ICD-10-CM | POA: Diagnosis not present

## 2021-09-18 DIAGNOSIS — C3412 Malignant neoplasm of upper lobe, left bronchus or lung: Secondary | ICD-10-CM | POA: Diagnosis not present

## 2021-09-18 DIAGNOSIS — Z7901 Long term (current) use of anticoagulants: Secondary | ICD-10-CM | POA: Diagnosis not present

## 2021-09-18 DIAGNOSIS — E860 Dehydration: Secondary | ICD-10-CM

## 2021-09-18 LAB — COMPREHENSIVE METABOLIC PANEL
ALT: 17 U/L (ref 0–44)
AST: 25 U/L (ref 15–41)
Albumin: 3 g/dL — ABNORMAL LOW (ref 3.5–5.0)
Alkaline Phosphatase: 76 U/L (ref 38–126)
Anion gap: 6 (ref 5–15)
BUN: 26 mg/dL — ABNORMAL HIGH (ref 8–23)
CO2: 24 mmol/L (ref 22–32)
Calcium: 8.3 mg/dL — ABNORMAL LOW (ref 8.9–10.3)
Chloride: 105 mmol/L (ref 98–111)
Creatinine, Ser: 1.23 mg/dL (ref 0.61–1.24)
GFR, Estimated: >60 mL/min (ref >60)
Glucose, Bld: 121 mg/dL — ABNORMAL HIGH (ref 70–99)
Potassium: 4.4 mmol/L (ref 3.5–5.1)
Sodium: 135 mmol/L (ref 135–145)
Total Bilirubin: 0.4 mg/dL (ref 0.3–1.2)
Total Protein: 6.1 g/dL — ABNORMAL LOW (ref 6.5–8.1)

## 2021-09-18 LAB — CBC WITH DIFFERENTIAL/PLATELET

## 2021-09-18 LAB — LACTIC ACID, PLASMA: Lactic Acid, Venous: 1.8 mmol/L (ref 0.5–1.9)

## 2021-09-18 LAB — RESP PANEL BY RT-PCR (FLU A&B, COVID) ARPGX2
Influenza A by PCR: NEGATIVE
Influenza B by PCR: NEGATIVE
SARS Coronavirus 2 by RT PCR: NEGATIVE

## 2021-09-18 LAB — LIPASE, BLOOD: Lipase: 30 U/L (ref 11–51)

## 2021-09-18 MED ORDER — SODIUM CHLORIDE 0.9% FLUSH
10.0000 mL | Freq: Once | INTRAVENOUS | Status: AC
  Administered 2021-09-18: 10 mL via INTRAVENOUS
  Filled 2021-09-18: qty 10

## 2021-09-18 MED ORDER — HEPARIN SOD (PORK) LOCK FLUSH 100 UNIT/ML IV SOLN
500.0000 [IU] | Freq: Once | INTRAVENOUS | Status: AC
  Administered 2021-09-18: 500 [IU] via INTRAVENOUS
  Filled 2021-09-18: qty 5

## 2021-09-18 MED ORDER — SODIUM CHLORIDE 0.9 % IV BOLUS
1000.0000 mL | Freq: Once | INTRAVENOUS | Status: AC
  Administered 2021-09-18: 1000 mL via INTRAVENOUS

## 2021-09-18 MED ORDER — SODIUM CHLORIDE 0.9 % IV SOLN
Freq: Once | INTRAVENOUS | Status: AC
  Filled 2021-09-18: qty 250

## 2021-09-18 MED ORDER — IOHEXOL 300 MG/ML  SOLN
100.0000 mL | Freq: Once | INTRAMUSCULAR | Status: AC | PRN
  Administered 2021-09-19: 80 mL via INTRAVENOUS

## 2021-09-18 NOTE — ED Triage Notes (Signed)
Pt presents to ER c/o fever, nausea and vomiting that started around 1745.  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  Pt states his temp at home was 100.3.  pt has not taken anything at home for fever.  Pt has also had diarrhea all week and has been getting IVF all week.  Pt took ondansetron 30 minutes pta.  Pt currently A&O x4 at this time in NAD.   ?

## 2021-09-18 NOTE — Progress Notes (Signed)
Pt's BP has been running low. Pt has been receiving IVF's to help. Received 1 L NS over 1 hour. No complaints of nausea or diarrhea. Eating much better. ?

## 2021-09-18 NOTE — ED Provider Notes (Signed)
[facility] Provider Note    Event Date/Time   First MD Initiated Contact with Patient 09/18/21 2303     (approximate)   History   Fever   HPI  Richard Wiggins is a 71 y.o. male who presents to the ED from home with a chief complaint of fever, nausea, vomiting and diarrhea.  Patient is a lung cancer patient who had chemotherapy last week.  Spouse reports patient has had diarrhea all week and has received IV fluids in the oncology office 3 times this week, most recently this afternoon.  Presents tonight due to fever of 100.4 F at home, nausea and dry heaving.  Reports diarrhea has improved and did a stool study earlier in the week which was negative per their report.  Endorses mild cough.  Did not take antipyretic prior to arrival but did take a Zofran 30 minutes prior to arrival.  Denies chest pain, shortness of breath, abdominal pain, dysuria.     Past Medical History   Past Medical History:  Diagnosis Date   AAA (abdominal aortic aneurysm) 2020   a. 2020 U/S: 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 -plan medical managmment with tight bp control; b. 09/2020 Abd U/S: 4.1cm.   Anxiety    Aortic atherosclerosis (HCC)    Benign essential HTN 02/10/2020   BPH (benign prostatic hyperplasia) 02/09/2020   Brain aneurysm    Cancer of upper lobe of left lung ([place]) 04/2020   a.) ENB/EBUS performed; Bx non-diagnostic. b.) presumed stage I NSCLC in the LUL; underwent SBRT (60 Gy over 5 fractions)   Carotid arterial disease ([place])    a. 05/2017 s/p R carotid endarterectomy following CVA.   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 2018 Echo: EF 30-35%; b. 02/2021 Echo: EF 55-60%, nl RV fxn. Triv MR. Asc Ao 25mm.   CKD (chronic kidney disease), stage III (HCC)    Claudication ([place])    a. 09/2020 ABIs: R 0.91. L 0.99.   COPD (chronic obstructive pulmonary disease) (HCC)    Coronary artery disease    a. 06/2017 PCI ([place]): s/p PCI to the RCA. LAD  50, LCX 20.   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 02/2021 Asc Ao 24mm.   Emphysema of lung (HCC)    HLD (hyperlipidemia)    IDA (iron deficiency anemia) 02/09/2020   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   Ischemic cardiomyopathy    Long term current use of anticoagulant    a.) apixaban   Marijuana use, continuous 02/10/2020   Osteoporosis    PAF (paroxysmal atrial fibrillation) ([place])    a. 05/2017 Dx in setting of CVA. CHA2DS2VASc = 6-->Eliquis.   Pre-diabetes    Pulmonary emboli ([place]) 06/07/2017   Right lower lobe pulmonary embolism small segmental, multifocal multifocal pneumonia, mediastinal lymphadenopathy, moderate centrilobular emphysema   Seizures ([place])    Sigmoid diverticulosis    a.) CT 01/27/2021: severe   Stroke [facility])    a. 05/2017 - hospitalized in [place]->prolonged hospitalization in setting of R CEA, PE, and finding of RCA dzs on cath; b. Residual left sided weakness to arm and leg.     Active Problem List   Patient Active Problem List   Diagnosis Date Noted   Recurrent non-small cell lung cancer ([place]) 08/18/2021   Renal artery stenosis ([place]) 08/11/2021   CKD (chronic kidney disease), stage III ([place]) 03/11/2021   Dilation of ascending aorta and  aortic root ([place]) 03/11/2021   Iron deficiency anemia 02/04/2021   Long term (current) use of anticoagulants 11/12/2020   Positive colorectal cancer screening using Cologuard test 05/22/2020   Goals of care, counseling/discussion 05/06/2020   Malignant neoplasm of upper lobe of left lung ([place]) 05/06/2020   Preventative health care 03/26/2020   Atherosclerosis of native arteries of extremity with intermittent claudication ([place]) 03/14/2020   Trigger middle finger of right hand 02/20/2020   Abdominal aortic aneurysm (AAA) without rupture 02/20/2020   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   Benign essential HTN 02/10/2020   Pre-diabetes 02/10/2020   BPH (benign prostatic  hyperplasia) 02/09/2020   Anemia 02/09/2020   Weakness of lower extremity 02/01/2020   COPD (chronic obstructive pulmonary disease) ([place]) 36/64/4034   Chronic systolic CHF (congestive heart failure) ([place]) 02/01/2020   Depression 02/01/2020   A-fib ([place]) 02/01/2020   CVA (cerebral vascular accident) ([place]) 02/01/2020   Coronary artery disease 02/01/2020   Carotid artery stenosis 02/01/2020   HLD (hyperlipidemia) 02/01/2020   Carotid stenosis, asymptomatic, left 02/14/2018   S/P coronary artery stent placement 09/20/2017   Centrilobular emphysema ([place]) 06/10/2017   Chronic back pain 06/10/2017   DOE (dyspnea on exertion) 06/10/2017   Elevated TSH 06/10/2017   Hematoma of groin 06/10/2017   PAD (peripheral artery disease) ([place]) 06/10/2017   Pulmonary embolism ([place] ) 06/10/2017   Tobacco abuse 06/10/2017   Unintended weight loss 06/10/2017   AAA (abdominal aortic aneurysm) 06/10/2017   PAF (paroxysmal atrial fibrillation) ([place]) 74/25/9563   Systolic CHF, chronic ([place]) 06/10/2017   Hypertension 05/31/2017   Other hyperlipidemia 05/31/2017   Hemiparesis affecting left side as late effect of cerebrovascular accident ([place]) 05/29/2017   Carotid artery stenosis, symptomatic, right 87/56/4332   Embolic stroke involving right middle cerebral artery ([place]) 05/28/2017     Past Surgical History   Past Surgical History:  Procedure Laterality Date   brain aneurysm with clip     COLONOSCOPY WITH PROPOFOL N/A 07/23/2020   Procedure: COLONOSCOPY WITH PROPOFOL;  Surgeon: Lin Landsman, MD;  Location: [facility] ENDOSCOPY;  Service: Gastroenterology;  Laterality: N/A;   CORONARY ANGIOPLASTY WITH STENT PLACEMENT  07/08/2017   PORTA CATH INSERTION N/A 08/31/2021   Procedure: PORTA CATH INSERTION;  Surgeon: Algernon Huxley, MD;  Location: [place] CV LAB;  Service: Cardiovascular;  Laterality: N/A;   TOTAL HIP ARTHROPLASTY Right    VIDEO BRONCHOSCOPY WITH ENDOBRONCHIAL NAVIGATION N/A 04/23/2020    Procedure: VIDEO BRONCHOSCOPY WITH ENDOBRONCHIAL NAVIGATION;  Surgeon: Tyler Pita, MD;  Location: [facility] ORS;  Service: Pulmonary;  Laterality: N/A;   VIDEO BRONCHOSCOPY WITH ENDOBRONCHIAL ULTRASOUND Left 08/12/2021   Procedure: VIDEO BRONCHOSCOPY WITH ENDOBRONCHIAL ULTRASOUND;  Surgeon: Tyler Pita, MD;  Location: [facility] ORS;  Service: Cardiopulmonary;  Laterality: Left;     Home Medications   Prior to Admission medications   Medication Sig Start Date End Date Taking? Authorizing Provider  albuterol (PROVENTIL) (2.5 MG/3ML) 0.083% nebulizer solution Take 3 mLs (2.5 mg total) by nebulization every 6 (six) hours as needed for wheezing or shortness of breath. 10/13/20  Yes Tyler Pita, MD  albuterol (VENTOLIN HFA) 108 (90 Base) MCG/ACT inhaler Inhale 2 puffs into the lungs every 6 (six) hours as needed for wheezing or shortness of breath. 07/21/21  Yes Tyler Pita, MD  apixaban (ELIQUIS) 5 MG TABS tablet TAKE 1 TABLET(5 MG) BY MOUTH IN THE MORNING AND AT BEDTIME 08/20/21  Yes Agbor-Etang, Aaron Edelman, MD  aspirin 81 MG chewable tablet CHEW ONE TABLET  BY MOUTH DAILY FOR HEART 09/11/21  Yes [provider]  atorvastatin (LIPITOR) 40 MG tablet TAKE 1 TABLET(40 MG) BY MOUTH AT BEDTIME 11/17/20  Yes Chrismon, Dennis E, PA-C  atorvastatin (LIPITOR) 40 MG tablet TAKE ONE TABLET BY MOUTH AT BEDTIME FOR CHOLESTEROL 09/11/21  Yes [provider]  baclofen (LIORESAL) 10 MG tablet Take 1 tablet (10 mg total) by mouth 3 (three) times daily as needed for muscle spasms. 09/12/21  Yes Sindy Guadeloupe, MD  Budeson-Glycopyrrol-Formoterol (BREZTRI AEROSPHERE) 160-9-4.8 MCG/ACT AERO Inhale 2 puffs into the lungs in the morning and at bedtime. 12/30/20  Yes Tyler Pita, MD  carvedilol (COREG) 25 MG tablet Take 0.5 tablets (12.5 mg total) by mouth in the morning and at bedtime. 09/14/21  Yes Agbor-Etang, Aaron Edelman, MD  dexamethasone (DECADRON) 4 MG tablet Take 2 tablets (8 mg total) by mouth daily.  Start the day after chemotherapy for 2 days. 08/27/21  Yes Sindy Guadeloupe, MD  lamoTRIgine (LAMICTAL) 100 MG tablet Take 100 mg by mouth 2 (two) times daily. 08/22/20  Yes [provider]  lidocaine-prilocaine (EMLA) cream Apply to affected area once 08/27/21  Yes Sindy Guadeloupe, MD  LORazepam (ATIVAN) 0.5 MG tablet Take 1 tablet (0.5 mg total) by mouth every 6 (six) hours as needed (Nausea or vomiting). 08/27/21  Yes Sindy Guadeloupe, MD  Multiple Vitamin (MULTI-VITAMIN) tablet Take 1 tablet by mouth daily.   Yes [provider]  ondansetron (ZOFRAN) 8 MG tablet Take 1 tablet (8 mg total) by mouth 2 (two) times daily as needed for refractory nausea / vomiting. Start on day 3 after chemo. 08/27/21  Yes Sindy Guadeloupe, MD  prochlorperazine (COMPAZINE) 10 MG tablet Take 1 tablet (10 mg total) by mouth every 6 (six) hours as needed (Nausea or vomiting). 08/27/21  Yes Sindy Guadeloupe, MD  sacubitril-valsartan (ENTRESTO) 49-51 MG Take 0.5 tablets by mouth in the morning and at bedtime.   Yes [provider]  sucralfate (CARAFATE) 1 g tablet Take 1 tablet (1 g total) by mouth 3 (three) times daily. Dissolve in 3-4 tbsp warm water, swish and swallow. 09/15/21  Yes Chrystal, Eulas Post, MD  tamsulosin (FLOMAX) 0.4 MG CAPS capsule Take 1 capsule (0.4 mg total) by mouth daily. 08/10/21  Yes Birdie Sons, MD  furosemide (LASIX) 40 MG tablet Take 1 tablet (40 mg total) by mouth as needed. Patient not taking: Reported on 09/16/2021 03/13/21   Kate Sable, MD  Lidocaine HCl-Benzyl Alcohol (SALONPAS LIDOCAINE PLUS EX) Place 1 patch onto the skin daily as needed (pain.). Patient not taking: Reported on 09/16/2021    [provider]  spironolactone (ALDACTONE) 25 MG tablet TAKE ONE TABLET BY MOUTH DAILY FOR HYPERTENSION Patient not taking: Reported on 09/16/2021 09/11/21   [provider]     Allergies  Tape and Wound dressing adhesive   Family History   Family History  Problem  Relation Age of Onset   Diabetes Mother    Heart disease Mother    Stroke Father    Heart disease Father    Heart attack Father    Alcohol abuse Father    Heart disease Sister    Heart attack Sister    Heart disease Maternal Grandmother    Heart attack Maternal Grandmother    Heart attack Paternal Grandmother    Diabetes Brother    Heart disease Brother    Heart attack Brother      Physical Exam  Triage Vital Signs:  ED Triage Vitals  Enc Vitals Group     BP 09/18/21 2235 (!) 112/101     Pulse Rate 09/18/21 2235 92     Resp 09/18/21 2235 18     Temp 09/18/21 2235 99.4 F (37.4 C)     Temp Source 09/18/21 2235 Oral     SpO2 09/18/21 2235 95 %     Weight 09/18/21 2236 195 lb (88.5 kg)     Height 09/18/21 2236 5\' 10"  (1.778 m)     Head Circumference --      Peak Flow --      Pain Score 09/18/21 2235 0     Pain Loc --      Pain Edu? --      Excl. in [place]? --     Updated Vital Signs: BP 131/63    Pulse 78    Temp 98.9 F (37.2 C) (Oral)    Resp 18    Ht 5\' 10"  (1.778 m)    Wt 88.5 kg    SpO2 97%    BMI 27.98 kg/m    General: Awake, no distress.  Mildly dry mucous membranes. CV:  RRR.  Good peripheral perfusion.   Resp:  Normal effort.  CTA B. Abd:  Nontender to light or deep palpation.  No distention.  Other:  Supple neck without meningismus.  No petechiae.   ED Results / Procedures / Treatments  Labs (all labs ordered are listed, but only abnormal results are displayed) Labs Reviewed  CBC WITH DIFFERENTIAL/PLATELET - Abnormal; Notable for the following components:      Result Value   WBC 3.1 (*)    RBC 2.83 (*)    Hemoglobin 8.0 (*)    HCT 25.8 (*)    RDW 16.1 (*)    Platelets 103 (*)    Lymphs Abs 0.1 (*)    All other components within normal limits  COMPREHENSIVE METABOLIC PANEL - Abnormal; Notable for the following components:   Glucose, Bld 121 (*)    BUN 26 (*)    Calcium 8.3 (*)    Total Protein 6.1 (*)    Albumin 3.0 (*)    All other components  within normal limits  URINALYSIS, COMPLETE (UACMP) WITH MICROSCOPIC - Abnormal; Notable for the following components:   Color, Urine YELLOW (*)    APPearance CLEAR (*)    Specific Gravity, Urine 1.036 (*)    All other components within normal limits  RESP PANEL BY RT-PCR (FLU A&B, COVID) ARPGX2  CULTURE, BLOOD (ROUTINE X 2)  CULTURE, BLOOD (ROUTINE X 2)  URINE CULTURE  LIPASE, BLOOD  LACTIC ACID, PLASMA  TROPONIN I (HIGH SENSITIVITY)     EKG  ED ECG REPORT I, Minh Jasper J, the attending physician, personally viewed and interpreted this ECG.   Date: 09/18/2021  EKG Time: 2252  Rate: 88  Rhythm: normal sinus rhythm  Axis: RAD  Intervals:none  ST&T Change: Nonspecific    RADIOLOGY I have independently visualized and reviewed patient's chest x-ray, CT abdomen pelvis as well as noted the radiology interpretation:  Chest x-ray: Stable x-ray  CT abdomen pelvis: No acute intra-abdominal abnormality; 4.7 cm nonruptured infrarenal AAA  Official radiology report(s): DG Chest 2 View  Result Date: 09/19/2021 CLINICAL DATA:  Fever, nausea and vomiting. Indicates history of stage III small cell lung carcinoma. EXAM: CHEST - 2 VIEW COMPARISON:  Portable chest 08/12/2021, chest CT 07/08/2021. FINDINGS: The cardiac size is normal. Mediastinal configuration is stable, with aortic atherosclerosis. A port  infusion device has been implanted in the upper right chest since the prior study with an IJ approach catheter terminating in the SVC just above the azygous confluence. The lungs are emphysematous. Left upper lobe solid nodule is not grossly changed. Surrounding stranding densities and haziness continue to be noted, possibly indicating postradiation pneumonitis but not significantly changed in appearance. Estimated nodule size 2.2 cm is also similar. No pleural effusion is seen. There is eventration and mild elevation of right hemidiaphragm. Osteopenia with thoracic spondylosis. IMPRESSION: Left  upper lobe nodule and adjacent reactive parenchymal changes show no interval improvement or worsening. New port device and right IJ approach catheter noted with the tip in the mid SVC. No pneumothorax. Electronically Signed   By: Telford Nab M.D.   On: 09/19/2021 00:05   CT Abdomen Pelvis W Contrast  Result Date: 09/19/2021 CLINICAL DATA:  Nausea vomiting and diarrhea. EXAM: CT ABDOMEN AND PELVIS WITH CONTRAST TECHNIQUE: Multidetector CT imaging of the abdomen and pelvis was performed using the standard protocol following bolus administration of intravenous contrast. RADIATION DOSE REDUCTION: This exam was performed according to the departmental dose-optimization program which includes automated exposure control, adjustment of the mA and/or kV according to patient size and/or use of iterative reconstruction technique. CONTRAST:  64mL OMNIPAQUE IOHEXOL 300 MG/ML  SOLN COMPARISON:  None. FINDINGS: Lower chest: Bibasilar linear atelectasis/scarring. Three vessel coronary vascular calcification. No intra-abdominal free air or free fluid. Hepatobiliary: No focal liver abnormality is seen. No gallstones, gallbladder wall thickening, or biliary dilatation. Pancreas: Unremarkable. No pancreatic ductal dilatation or surrounding inflammatory changes. Spleen: Normal in size without focal abnormality. Adrenals/Urinary Tract: The adrenal glands are unremarkable. There is no hydronephrosis on either side. There is symmetric enhancement and excretion of contrast by both kidneys. There is a 2 cm left renal upper pole cyst. Additional subcentimeter bilateral renal hypodense lesions are too small to characterize. The visualized ureters and urinary bladder appear unremarkable. Stomach/Bowel: There is sigmoid diverticulosis with muscular hypertrophy no definite active inflammatory changes. There are additional scattered colonic diverticula without active inflammatory changes. There is no bowel obstruction. The appendix is not  visualized with certainty. No inflammatory changes identified in the right lower quadrant. Vascular/Lymphatic: Advanced aortoiliac atherosclerotic disease. There is a 4.7 cm fusiform infrarenal abdominal aortic aneurysm. There is a retroaortic left renal vein anatomy. The IVC is unremarkable. No portal venous gas. There is no adenopathy. Reproductive: The prostate is grossly unremarkable. Other: Small fat containing right inguinal hernia. Musculoskeletal: Osteopenia with degenerative changes of the spine. Total right hip arthroplasty. No acute osseous pathology. A 3.5 cm lucency or cystic change in the right acetabulum superior to the acetabular cup. IMPRESSION: 1. No acute intra-abdominal or pelvic pathology. 2. Sigmoid diverticulosis.  No bowel obstruction. 3. A 4.7 cm fusiform infrarenal abdominal aortic aneurysm. Recommend follow-up CT/MR every 6 months and vascular consultation. This recommendation follows ACR consensus guidelines: White Paper of the ACR Incidental Findings Committee II on Vascular Findings. J Am Coll Radiol 2013; 32:355-732. 4. Aortic Atherosclerosis (ICD10-I70.0). Electronically Signed   By: Anner Crete M.D.   On: 09/19/2021 00:23     PROCEDURES:  Critical Care performed: No  .1-3 Lead EKG Interpretation Performed by: Paulette Blanch, MD Authorized by: Paulette Blanch, MD     Interpretation: normal     ECG rate:  95   ECG rate assessment: normal     Rhythm: sinus rhythm     Ectopy: none     Conduction: normal   Comments:  Patient placed on cardiac monitor to evaluate for arrhythmias   MEDICATIONS ORDERED IN ED: Medications  sodium chloride 0.9 % bolus 1,000 mL (1,000 mLs Intravenous New Bag/Given 09/18/21 2329)  iohexol (OMNIPAQUE) 300 MG/ML solution 100 mL (80 mLs Intravenous Contrast Given 09/19/21 0002)     IMPRESSION / MDM / ASSESSMENT AND PLAN / ED COURSE  I reviewed the triage vital signs and the nursing notes.                             71 year old lung  cancer patient on active chemotherapy who presents with fever, mild cough, nausea/vomiting/diarrhea. Differential diagnosis includes, but is not limited to, acute appendicitis, renal colic, testicular torsion, urinary tract infection/pyelonephritis, prostatitis,  epididymitis, diverticulitis, small bowel obstruction or ileus, colitis, abdominal aortic aneurysm, gastroenteritis, hernia, etc. I have personally reviewed patient's records and see his IV fluid infusion for dehydration today at the oncology office.  I have also noted his oncology office visit from 09/16/2021 where it is noted that he was hypotensive with an SBP in the 70s requiring IV fluids at the office.  His spironolactone dose was reduced by cardiology.  I have noted patient's stool study which was negative for C. difficile, Salmonella, and Shigella.  The patient is on the cardiac monitor to evaluate for evidence of arrhythmia and/or significant heart rate changes.  We will obtain sepsis protocol lab work, chest x-ray, CT abdomen pelvis, respiratory panel.  Patient currently voices no complaints of nausea or pain at this time.  Administer IV fluids.  Will reassess.  Rechecked oral temperature 98.6 F.  Clinical Course as of 09/19/21 0123  Sat Sep 19, 2021  0100 Patient resting in no acute distress.  Updated patient and his wife on all test results.  Pancytopenia noted with improvement of WBC from 3 days ago, stable anemia, slight dip in platelets.  Electrolytes noted for mildly elevated BUN 26, LFTs/lipase unremarkable.  Negative troponin, respiratory panel and UA.  X-ray unchanged.  CT abdomen pelvis unremarkable.  Looking through his records, patient had a visit with Dr. Lucky Cowboy from vascular surgery on 08/11/2021 who plans operative repair for his known AAA; however, surgery is on hold secondary to active chemotherapy treatments.  Lactic acid negative.  Blood and urine cultures are pending.  Patient afebrile without nausea or vomiting.  They had  called the on-call oncologist prior to coming; I will speak with Dr. Grayland Ormond and update him. [JS]  0120 No reply from oncology on-call; I will send Dr. Grayland Ormond a secure chat message updating him on the patient.  Patient has antiemetics at home.  Strict return precautions given.  Patient and spouse verbalized understanding agree with plan of care. [JS]    Clinical Course User Index [JS] Paulette Blanch, MD     FINAL CLINICAL IMPRESSION(S) / ED DIAGNOSES   Final diagnoses:  Nausea vomiting and diarrhea  Fever, unspecified fever cause  Infrarenal abdominal aortic aneurysm (AAA) without rupture     Rx / DC Orders   ED Discharge Orders     None        Note:  This document was prepared using Dragon voice recognition software and may include unintentional dictation errors.   Paulette Blanch, MD 09/19/21 [phone]

## 2021-09-19 LAB — URINALYSIS, COMPLETE (UACMP) WITH MICROSCOPIC
Bacteria, UA: NONE SEEN
Bilirubin Urine: NEGATIVE
Glucose, UA: NEGATIVE mg/dL
Hgb urine dipstick: NEGATIVE
Ketones, ur: NEGATIVE mg/dL
Leukocytes,Ua: NEGATIVE
Nitrite: NEGATIVE
Protein, ur: NEGATIVE mg/dL
Specific Gravity, Urine: 1.036 — ABNORMAL HIGH (ref 1.005–1.030)
pH: 5 (ref 5.0–8.0)

## 2021-09-19 LAB — CBC WITH DIFFERENTIAL/PLATELET
Abs Immature Granulocytes: 0.03 10*3/uL (ref 0.00–0.07)
Basophils Absolute: 0 10*3/uL (ref 0.0–0.1)
Basophils Relative: 0 %
Eosinophils Absolute: 0 10*3/uL (ref 0.0–0.5)
Eosinophils Relative: 0 %
HCT: 25.8 % — ABNORMAL LOW (ref 39.0–52.0)
Hemoglobin: 8 g/dL — ABNORMAL LOW (ref 13.0–17.0)
Immature Granulocytes: 1 %
Lymphocytes Relative: 2 %
Lymphs Abs: 0.1 10*3/uL — ABNORMAL LOW (ref 0.7–4.0)
MCH: 28.3 pg (ref 26.0–34.0)
MCHC: 31 g/dL (ref 30.0–36.0)
MCV: 91.2 fL (ref 80.0–100.0)
Monocytes Absolute: 0.2 10*3/uL (ref 0.1–1.0)
Monocytes Relative: 6 %
Neutro Abs: 2.8 10*3/uL (ref 1.7–7.7)
Neutrophils Relative %: 91 %
Platelets: 103 10*3/uL — ABNORMAL LOW (ref 150–400)
RBC: 2.83 MIL/uL — ABNORMAL LOW (ref 4.22–5.81)
RDW: 16.1 % — ABNORMAL HIGH (ref 11.5–15.5)
Smear Review: NORMAL
WBC: 3.1 10*3/uL — ABNORMAL LOW (ref 4.0–10.5)
nRBC: 0 % (ref 0.0–0.2)

## 2021-09-19 LAB — TROPONIN I (HIGH SENSITIVITY): Troponin I (High Sensitivity): 9 ng/L (ref <18)

## 2021-09-19 NOTE — Discharge Instructions (Signed)
You may take nausea medicine as needed.  Clear liquids x12 hours, then [employer] x3 days, then slowly advance diet as tolerated.  Blood and urine cultures are pending.  You will be notified of any positive results. Return to the ER for worsening symptoms, persistent vomiting, difficulty breathing or other concerns. ?

## 2021-09-20 ENCOUNTER — Emergency Department
Admission: EM | Admit: 2021-09-20 | Discharge: 2021-09-20 | Disposition: A | Discharge status: Home / Self Care | Payer: No Typology Code available for payment source | Attending: Emergency Medicine | Admitting: Emergency Medicine

## 2021-09-20 ENCOUNTER — Emergency Department: Payer: No Typology Code available for payment source

## 2021-09-20 ENCOUNTER — Other Ambulatory Visit: Payer: Self-pay

## 2021-09-20 ENCOUNTER — Encounter: Payer: Self-pay | Admitting: Pulmonary Disease

## 2021-09-20 ENCOUNTER — Encounter: Payer: Self-pay | Admitting: Oncology

## 2021-09-20 DIAGNOSIS — R0602 Shortness of breath: Secondary | ICD-10-CM | POA: Diagnosis present

## 2021-09-20 DIAGNOSIS — N189 Chronic kidney disease, unspecified: Secondary | ICD-10-CM | POA: Diagnosis not present

## 2021-09-20 DIAGNOSIS — I2510 Atherosclerotic heart disease of native coronary artery without angina pectoris: Secondary | ICD-10-CM | POA: Diagnosis not present

## 2021-09-20 DIAGNOSIS — Z85118 Personal history of other malignant neoplasm of bronchus and lung: Secondary | ICD-10-CM | POA: Insufficient documentation

## 2021-09-20 DIAGNOSIS — K4090 Unilateral inguinal hernia, without obstruction or gangrene, not specified as recurrent: Secondary | ICD-10-CM | POA: Insufficient documentation

## 2021-09-20 DIAGNOSIS — Z7901 Long term (current) use of anticoagulants: Secondary | ICD-10-CM | POA: Diagnosis not present

## 2021-09-20 DIAGNOSIS — J441 Chronic obstructive pulmonary disease with (acute) exacerbation: Secondary | ICD-10-CM | POA: Insufficient documentation

## 2021-09-20 LAB — STOOL CULTURE REFLEX - RSASHR

## 2021-09-20 LAB — COMPREHENSIVE METABOLIC PANEL
ALT: 19 U/L (ref 0–44)
AST: 28 U/L (ref 15–41)
Albumin: 3.1 g/dL — ABNORMAL LOW (ref 3.5–5.0)
Alkaline Phosphatase: 94 U/L (ref 38–126)
Anion gap: 7 (ref 5–15)
BUN: 22 mg/dL (ref 8–23)
CO2: 24 mmol/L (ref 22–32)
Calcium: 8.5 mg/dL — ABNORMAL LOW (ref 8.9–10.3)
Chloride: 105 mmol/L (ref 98–111)
Creatinine, Ser: 1.27 mg/dL — ABNORMAL HIGH (ref 0.61–1.24)
GFR, Estimated: >60 mL/min (ref >60)
Glucose, Bld: 136 mg/dL — ABNORMAL HIGH (ref 70–99)
Potassium: 4.2 mmol/L (ref 3.5–5.1)
Sodium: 136 mmol/L (ref 135–145)
Total Bilirubin: 0.6 mg/dL (ref 0.3–1.2)
Total Protein: 6.8 g/dL (ref 6.5–8.1)

## 2021-09-20 LAB — URINALYSIS, COMPLETE (UACMP) WITH MICROSCOPIC
Bacteria, UA: NONE SEEN
Bilirubin Urine: NEGATIVE
Glucose, UA: NEGATIVE mg/dL
Hgb urine dipstick: NEGATIVE
Ketones, ur: 5 mg/dL — AB
Leukocytes,Ua: NEGATIVE
Nitrite: NEGATIVE
Protein, ur: 30 mg/dL — AB
Specific Gravity, Urine: 1.019 (ref 1.005–1.030)
pH: 5 (ref 5.0–8.0)

## 2021-09-20 LAB — CBC
HCT: 27 % — ABNORMAL LOW (ref 39.0–52.0)
Hemoglobin: 8.3 g/dL — ABNORMAL LOW (ref 13.0–17.0)
MCH: 28.1 pg (ref 26.0–34.0)
MCHC: 30.7 g/dL (ref 30.0–36.0)
MCV: 91.5 fL (ref 80.0–100.0)
Platelets: 93 10*3/uL — ABNORMAL LOW (ref 150–400)
RBC: 2.95 MIL/uL — ABNORMAL LOW (ref 4.22–5.81)
RDW: 16.5 % — ABNORMAL HIGH (ref 11.5–15.5)
WBC: 2.2 10*3/uL — ABNORMAL LOW (ref 4.0–10.5)
nRBC: 0 % (ref 0.0–0.2)

## 2021-09-20 LAB — STOOL CULTURE: E coli, Shiga toxin Assay: NEGATIVE

## 2021-09-20 LAB — BRAIN NATRIURETIC PEPTIDE: B Natriuretic Peptide: 566.9 pg/mL — ABNORMAL HIGH (ref 0.0–100.0)

## 2021-09-20 LAB — URINE CULTURE

## 2021-09-20 LAB — LACTIC ACID, PLASMA: Lactic Acid, Venous: 1 mmol/L (ref 0.5–1.9)

## 2021-09-20 LAB — LIPASE, BLOOD: Lipase: 33 U/L (ref 11–51)

## 2021-09-20 LAB — STOOL CULTURE REFLEX - CMPCXR

## 2021-09-20 MED ORDER — ONDANSETRON HCL 4 MG/2ML IJ SOLN
4.0000 mg | Freq: Once | INTRAMUSCULAR | Status: AC
  Administered 2021-09-20: 4 mg via INTRAVENOUS
  Filled 2021-09-20: qty 2

## 2021-09-20 MED ORDER — IPRATROPIUM-ALBUTEROL 0.5-2.5 (3) MG/3ML IN SOLN
3.0000 mL | Freq: Once | RESPIRATORY_TRACT | Status: AC
  Administered 2021-09-20: 3 mL via RESPIRATORY_TRACT
  Filled 2021-09-20: qty 3

## 2021-09-20 MED ORDER — TRAMADOL HCL 50 MG PO TABS: 50.0000 mg | ORAL_TABLET | Freq: Four times a day (QID) | ORAL | 0 refills | Status: DC | PRN

## 2021-09-20 MED ORDER — MORPHINE SULFATE (PF) 4 MG/ML IV SOLN
4.0000 mg | Freq: Once | INTRAVENOUS | Status: AC
  Administered 2021-09-20: 4 mg via INTRAVENOUS
  Filled 2021-09-20: qty 1

## 2021-09-20 MED ORDER — SODIUM CHLORIDE 0.9 % IV BOLUS
500.0000 mL | Freq: Once | INTRAVENOUS | Status: AC
  Administered 2021-09-20: 500 mL via INTRAVENOUS

## 2021-09-20 NOTE — ED Provider Notes (Signed)
? ?[facility] ?Provider Note ? ? ? Event Date/Time  ? First MD Initiated Contact with Patient 09/20/21 [phone]   ?  (approximate) ? ? ?History  ? ?Groin Pain and Shortness of Breath ? ? ?HPI ? ?Richard Wiggins is a 71 y.o. male with past medical history including lung cancer, peripheral vascular disease, COPD CAD, CKD, paroxysmal atrial fibrillation on Eliquis who presents with complaints of shortness of breath and right groin pain.  Patient reports he has had groin pain for couple of days but seems to have worsened overnight significantly to the point that it is painful for him to stand or ambulate.  Denies falls.  He also reports worsening shortness of breath which he attributes to COPD.  Has had intermittent fevers over the last week.  Was seen 2 days ago in the emergency department for diarrhea, fever, that is improved ? ? ?Physical Exam  ? ?Triage Vital Signs: ?ED Triage Vitals  ?Enc Vitals Group  ?   BP 09/20/21 0658 (!) 174/81  ?   Pulse Rate 09/20/21 0658 96  ?   Resp 09/20/21 0658 (!) 32  ?   Temp 09/20/21 0658 99.4 ?F (37.4 ?C)  ?   Temp Source 09/20/21 0658 Oral  ?   SpO2 09/20/21 0657 95 %  ?   Weight --   ?   Height --   ?   Head Circumference --   ?   Peak Flow --   ?   Pain Score --   ?   Pain Loc --   ?   Pain Edu? --   ?   Excl. in [place]? --   ? ? ?Most recent vital signs: ?Vitals:  ? 09/20/21 1000 09/20/21 1030  ?BP: 130/65 (!) 119/48  ?Pulse: 81 84  ?Resp: 10 16  ?Temp:    ?SpO2: 97% 99%  ? ? ? ?General: Awake,  ?CV:  Good peripheral perfusion.  ?Resp:  Increased respiratory effort with mild tachypnea, scattered wheezes, bibasilar Rales ?Abd:  No distention.  ?Other:  No calf pain or swelling. ?Groin:, Small hernia palpable, reducible not tender however that is where the patient describes the pain, scrotum normal, no erythema or evidence of infection ? ? ?ED Results / Procedures / Treatments  ? ?Labs ?(all labs ordered are listed, but only abnormal results are displayed) ?Labs  Reviewed  ?CBC - Abnormal; Notable for the following components:  ?    Result Value  ? WBC 2.2 (*)   ? RBC 2.95 (*)   ? Hemoglobin 8.3 (*)   ? HCT 27.0 (*)   ? RDW 16.5 (*)   ? Platelets 93 (*)   ? All other components within normal limits  ?COMPREHENSIVE METABOLIC PANEL - Abnormal; Notable for the following components:  ? Glucose, Bld 136 (*)   ? Creatinine, Ser 1.27 (*)   ? Calcium 8.5 (*)   ? Albumin 3.1 (*)   ? All other components within normal limits  ?BRAIN NATRIURETIC PEPTIDE - Abnormal; Notable for the following components:  ? B Natriuretic Peptide 566.9 (*)   ? All other components within normal limits  ?URINALYSIS, COMPLETE (UACMP) WITH MICROSCOPIC - Abnormal; Notable for the following components:  ? Color, Urine YELLOW (*)   ? APPearance HAZY (*)   ? Ketones, ur 5 (*)   ? Protein, ur 30 (*)   ? All other components within normal limits  ?LIPASE, BLOOD  ?LACTIC ACID, PLASMA  ?LACTIC ACID, PLASMA  ? ? ? ?  EKG ? ?ED ECG REPORT ?I, Lavonia Drafts, the attending physician, personally viewed and interpreted this ECG. ? ?Date: 09/20/2021 ? ?Rhythm: normal sinus rhythm ?QRS Axis: normal ?Intervals: normal ?ST/T Wave abnormalities: normal ?Narrative Interpretation: no evidence of acute ischemia ? ? ? ?RADIOLOGY ?Chest x-ray reviewed by me, no acute change ? ? ? ?PROCEDURES: ? ?Critical Care performed:  ? ?Procedures ? ? ?MEDICATIONS ORDERED IN ED: ?Medications  ?ipratropium-albuterol (DUONEB) 0.5-2.5 (3) MG/3ML nebulizer solution 3 mL (3 mLs Nebulization Given 09/20/21 0729)  ?ipratropium-albuterol (DUONEB) 0.5-2.5 (3) MG/3ML nebulizer solution 3 mL (3 mLs Nebulization Given 09/20/21 0729)  ?sodium chloride 0.9 % bolus 500 mL (0 mLs Intravenous Stopped 09/20/21 0805)  ?morphine (PF) 4 MG/ML injection 4 mg (4 mg Intravenous Given 09/20/21 0742)  ?ondansetron [facility]) injection 4 mg (4 mg Intravenous Given 09/20/21 0741)  ? ? ? ?IMPRESSION / MDM / ASSESSMENT AND PLAN / ED COURSE  ?I reviewed the triage vital signs and the  nursing notes. ? ?Patient with extensive past medical history as detailed above presents with right groin pain, shortness of breath.  I suspect COPD is the cause of his shortness of breath although family reports that he has had positive sputum as well, and intermittent fevers.  Will obtain chest x-ray to evaluate for pneumonia, will treat with duo nebs ? ?Right groin pain: No falls to suggest traumatic injury.  Had CT scan 2 days ago which demonstrated right fat-containing hernia.  Patient does have peripheral vascular disease but right extremity is warm and well-perfused ? ?We will obtain urinalysis to evaluate for UTI ? ?----------------------------------------- ?8:45 AM on 09/20/2021 ?----------------------------------------- ?Patient reports feeling better, groin pain is improved.  Breathing is much better after treatment.  Still pending urinalysis. ? ? ?Urinalysis is reassuring, ambulated the patient, pulse oximetry stayed above 93%, patient reports his groin is feeling much better.  Reevaluated, warm and well-perfused lower extremity ? ?Considered admission however given improvement in shortness of breath and pain discussed with patient, he would like to go home.  He knows he can return anytime if any change in his symptoms. ? ? ? ? ?  ? ? ?FINAL CLINICAL IMPRESSION(S) / ED DIAGNOSES  ? ?Final diagnoses:  ?COPD exacerbation ([place])  ?Unilateral inguinal hernia without obstruction or gangrene, recurrence not specified  ? ? ? ?Rx / DC Orders  ? ?ED Discharge Orders   ? ?      Ordered  ?  traMADol (ULTRAM) 50 MG tablet  Every 6 hours PRN       ? 09/20/21 1100  ? ?  ?  ? ?  ? ? ? ?Note:  This document was prepared using Dragon voice recognition software and may include unintentional dictation errors. ?  ?Lavonia Drafts, MD ?09/20/21 1105 ? ?

## 2021-09-20 NOTE — ED Triage Notes (Signed)
Shortness of breath with Hx of COPD. Pt also reports right sided froin pain that started today.  ?

## 2021-09-21 ENCOUNTER — Ambulatory Visit: Payer: No Typology Code available for payment source

## 2021-09-21 ENCOUNTER — Encounter: Payer: Self-pay | Admitting: Cardiology

## 2021-09-21 ENCOUNTER — Other Ambulatory Visit: Payer: Self-pay

## 2021-09-21 ENCOUNTER — Other Ambulatory Visit: Payer: Self-pay | Admitting: *Deleted

## 2021-09-21 DIAGNOSIS — K4090 Unilateral inguinal hernia, without obstruction or gangrene, not specified as recurrent: Secondary | ICD-10-CM

## 2021-09-21 DIAGNOSIS — J441 Chronic obstructive pulmonary disease with (acute) exacerbation: Secondary | ICD-10-CM

## 2021-09-21 DIAGNOSIS — C3490 Malignant neoplasm of unspecified part of unspecified bronchus or lung: Secondary | ICD-10-CM

## 2021-09-21 DIAGNOSIS — J449 Chronic obstructive pulmonary disease, unspecified: Secondary | ICD-10-CM

## 2021-09-21 NOTE — Progress Notes (Unsigned)
ORDER PUT IN ?

## 2021-09-21 NOTE — Telephone Encounter (Signed)
Dr. Patsey Berthold, please advise if okay to order new nebulizer machine? ?

## 2021-09-21 NOTE — Telephone Encounter (Signed)
Yes please order new nebulizer and we can make sure that he has albuterol (plain) OR Xopenex (given albuterol shortage) for as needed use. ?

## 2021-09-22 ENCOUNTER — Telehealth: Payer: Self-pay

## 2021-09-22 ENCOUNTER — Ambulatory Visit: Payer: No Typology Code available for payment source

## 2021-09-22 DIAGNOSIS — J449 Chronic obstructive pulmonary disease, unspecified: Secondary | ICD-10-CM | POA: Diagnosis not present

## 2021-09-22 DIAGNOSIS — I5022 Chronic systolic (congestive) heart failure: Secondary | ICD-10-CM | POA: Diagnosis not present

## 2021-09-22 NOTE — Telephone Encounter (Signed)
Called patient and his wife. I informed them that Dr. Garen Lah recommended that the patient weighs every morning and if he has a 2-3 lb weight gain over night or 5 lbs in a week, then he should take his Lasix 40 MG prn dose. If that does not resolve any weight gain then let us know. Patients wife stated that he does not have any swelling in his extremities as he has in the past and she will continue to monitor for swelling. I also informed them that Dr. Garen Lah reviewed patients last Echo from 02/2021 and patient had a normal EF of 55-60%. Patient and his wife both verbalized understanding and agreed with plan. They were very grateful for the follow up. ? ?MyChart message from patient 3/14: ?Brain natriuretic peptide please review results and tell me if this is something of concern.  Merry Proud was in the ER twice this weekend.  First time on Friday due to fever and vomiting, second time pain in groin, fever and other respiratory symptoms.  This result was from yesterday.  The higher this number indicates heart failure? ?

## 2021-09-22 NOTE — Addendum Note (Signed)
Addended by: Altha Harm R on: 09/22/2021 02:27 PM ? ? Modules accepted: Orders ? ?

## 2021-09-23 ENCOUNTER — Observation Stay
Admission: EM | Admit: 2021-09-23 | Discharge: 2021-09-24 | Disposition: A | Discharge status: Home Health | Payer: No Typology Code available for payment source | Attending: Internal Medicine | Admitting: Internal Medicine

## 2021-09-23 ENCOUNTER — Other Ambulatory Visit: Payer: Self-pay

## 2021-09-23 ENCOUNTER — Encounter: Payer: Self-pay | Admitting: *Deleted

## 2021-09-23 ENCOUNTER — Ambulatory Visit: Payer: No Typology Code available for payment source

## 2021-09-23 ENCOUNTER — Telehealth: Payer: Self-pay

## 2021-09-23 ENCOUNTER — Telehealth: Payer: Self-pay | Admitting: *Deleted

## 2021-09-23 ENCOUNTER — Encounter: Payer: Self-pay | Admitting: Intensive Care

## 2021-09-23 DIAGNOSIS — Z87891 Personal history of nicotine dependence: Secondary | ICD-10-CM | POA: Diagnosis not present

## 2021-09-23 DIAGNOSIS — Z20822 Contact with and (suspected) exposure to covid-19: Secondary | ICD-10-CM | POA: Diagnosis not present

## 2021-09-23 DIAGNOSIS — D649 Anemia, unspecified: Secondary | ICD-10-CM | POA: Diagnosis not present

## 2021-09-23 DIAGNOSIS — E860 Dehydration: Secondary | ICD-10-CM

## 2021-09-23 DIAGNOSIS — D62 Acute posthemorrhagic anemia: Principal | ICD-10-CM | POA: Insufficient documentation

## 2021-09-23 DIAGNOSIS — C3412 Malignant neoplasm of upper lobe, left bronchus or lung: Secondary | ICD-10-CM | POA: Diagnosis not present

## 2021-09-23 DIAGNOSIS — R531 Weakness: Secondary | ICD-10-CM | POA: Diagnosis present

## 2021-09-23 DIAGNOSIS — D61818 Other pancytopenia: Secondary | ICD-10-CM

## 2021-09-23 DIAGNOSIS — N1831 Chronic kidney disease, stage 3a: Secondary | ICD-10-CM | POA: Insufficient documentation

## 2021-09-23 DIAGNOSIS — R040 Epistaxis: Secondary | ICD-10-CM

## 2021-09-23 DIAGNOSIS — I130 Hypertensive heart and chronic kidney disease with heart failure and stage 1 through stage 4 chronic kidney disease, or unspecified chronic kidney disease: Secondary | ICD-10-CM | POA: Diagnosis not present

## 2021-09-23 DIAGNOSIS — E440 Moderate protein-calorie malnutrition: Secondary | ICD-10-CM | POA: Insufficient documentation

## 2021-09-23 DIAGNOSIS — I480 Paroxysmal atrial fibrillation: Secondary | ICD-10-CM | POA: Diagnosis present

## 2021-09-23 DIAGNOSIS — Z7901 Long term (current) use of anticoagulants: Secondary | ICD-10-CM | POA: Insufficient documentation

## 2021-09-23 DIAGNOSIS — Z96641 Presence of right artificial hip joint: Secondary | ICD-10-CM | POA: Insufficient documentation

## 2021-09-23 DIAGNOSIS — I5022 Chronic systolic (congestive) heart failure: Secondary | ICD-10-CM | POA: Diagnosis not present

## 2021-09-23 DIAGNOSIS — J449 Chronic obstructive pulmonary disease, unspecified: Secondary | ICD-10-CM | POA: Diagnosis not present

## 2021-09-23 DIAGNOSIS — E778 Other disorders of glycoprotein metabolism: Secondary | ICD-10-CM | POA: Diagnosis not present

## 2021-09-23 DIAGNOSIS — C3490 Malignant neoplasm of unspecified part of unspecified bronchus or lung: Secondary | ICD-10-CM

## 2021-09-23 LAB — BRAIN NATRIURETIC PEPTIDE: B Natriuretic Peptide: 826.9 pg/mL — ABNORMAL HIGH (ref 0.0–100.0)

## 2021-09-23 LAB — COMPREHENSIVE METABOLIC PANEL
ALT: 19 U/L (ref 0–44)
AST: 25 U/L (ref 15–41)
Albumin: 2.7 g/dL — ABNORMAL LOW (ref 3.5–5.0)
Alkaline Phosphatase: 92 U/L (ref 38–126)
Anion gap: 7 (ref 5–15)
BUN: 28 mg/dL — ABNORMAL HIGH (ref 8–23)
CO2: 25 mmol/L (ref 22–32)
Calcium: 8.4 mg/dL — ABNORMAL LOW (ref 8.9–10.3)
Chloride: 102 mmol/L (ref 98–111)
Creatinine, Ser: 1.37 mg/dL — ABNORMAL HIGH (ref 0.61–1.24)
GFR, Estimated: 55 mL/min — ABNORMAL LOW (ref >60)
Glucose, Bld: 101 mg/dL — ABNORMAL HIGH (ref 70–99)
Potassium: 3.8 mmol/L (ref 3.5–5.1)
Sodium: 134 mmol/L — ABNORMAL LOW (ref 135–145)
Total Bilirubin: 0.4 mg/dL (ref 0.3–1.2)
Total Protein: 6.4 g/dL — ABNORMAL LOW (ref 6.5–8.1)

## 2021-09-23 LAB — RETICULOCYTES
Immature Retic Fract: 20.2 % — ABNORMAL HIGH (ref 2.3–15.9)
RBC.: 2.41 MIL/uL — ABNORMAL LOW (ref 4.22–5.81)
Retic Count, Absolute: 26.5 10*3/uL (ref 19.0–186.0)
Retic Ct Pct: 1.1 % (ref 0.4–3.1)

## 2021-09-23 LAB — CBC WITH DIFFERENTIAL/PLATELET
Abs Immature Granulocytes: 0.03 10*3/uL (ref 0.00–0.07)
Basophils Absolute: 0 10*3/uL (ref 0.0–0.1)
Basophils Relative: 1 %
Eosinophils Absolute: 0 10*3/uL (ref 0.0–0.5)
Eosinophils Relative: 0 %
HCT: 22 % — ABNORMAL LOW (ref 39.0–52.0)
Hemoglobin: 6.9 g/dL — ABNORMAL LOW (ref 13.0–17.0)
Immature Granulocytes: 1 %
Lymphocytes Relative: 11 %
Lymphs Abs: 0.3 10*3/uL — ABNORMAL LOW (ref 0.7–4.0)
MCH: 28.2 pg (ref 26.0–34.0)
MCHC: 31.4 g/dL (ref 30.0–36.0)
MCV: 89.8 fL (ref 80.0–100.0)
Monocytes Absolute: 0.2 10*3/uL (ref 0.1–1.0)
Monocytes Relative: 10 %
Neutro Abs: 1.7 10*3/uL (ref 1.7–7.7)
Neutrophils Relative %: 77 %
Platelets: 87 10*3/uL — ABNORMAL LOW (ref 150–400)
RBC: 2.45 MIL/uL — ABNORMAL LOW (ref 4.22–5.81)
RDW: 16.5 % — ABNORMAL HIGH (ref 11.5–15.5)
WBC: 2.2 10*3/uL — ABNORMAL LOW (ref 4.0–10.5)
nRBC: 0 % (ref 0.0–0.2)

## 2021-09-23 LAB — IRON AND TIBC
Iron: 16 ug/dL — ABNORMAL LOW (ref 45–182)
Saturation Ratios: 8 % — ABNORMAL LOW (ref 17.9–39.5)
TIBC: 204 ug/dL — ABNORMAL LOW (ref 250–450)
UIBC: 188 ug/dL

## 2021-09-23 LAB — RESP PANEL BY RT-PCR (FLU A&B, COVID) ARPGX2
Influenza A by PCR: NEGATIVE
Influenza B by PCR: NEGATIVE
SARS Coronavirus 2 by RT PCR: NEGATIVE

## 2021-09-23 LAB — LIPASE, BLOOD: Lipase: 27 U/L (ref 11–51)

## 2021-09-23 LAB — TROPONIN I (HIGH SENSITIVITY)
Troponin I (High Sensitivity): 29 ng/L — ABNORMAL HIGH (ref <18)
Troponin I (High Sensitivity): 28 ng/L — ABNORMAL HIGH (ref <18)

## 2021-09-23 LAB — CULTURE, BLOOD (ROUTINE X 2)
Culture: NO GROWTH
Special Requests: ADEQUATE

## 2021-09-23 LAB — FERRITIN: Ferritin: 394 ng/mL — ABNORMAL HIGH (ref 24–336)

## 2021-09-23 LAB — ABO/RH: ABO/RH(D): B POS

## 2021-09-23 LAB — PREPARE RBC (CROSSMATCH)

## 2021-09-23 LAB — PHOSPHORUS: Phosphorus: 3 mg/dL (ref 2.5–4.6)

## 2021-09-23 LAB — MAGNESIUM: Magnesium: 2.1 mg/dL (ref 1.7–2.4)

## 2021-09-23 MED ORDER — LACTATED RINGERS IV BOLUS
1000.0000 mL | Freq: Once | INTRAVENOUS | Status: AC
  Administered 2021-09-23: 1000 mL via INTRAVENOUS

## 2021-09-23 MED ORDER — SODIUM CHLORIDE 0.9 % IV SOLN: INTRAVENOUS | Status: DC

## 2021-09-23 MED ORDER — PROCHLORPERAZINE MALEATE 10 MG PO TABS
10.0000 mg | ORAL_TABLET | Freq: Four times a day (QID) | ORAL | Status: DC | PRN
  Filled 2021-09-23: qty 1

## 2021-09-23 MED ORDER — CARVEDILOL 6.25 MG PO TABS
12.5000 mg | ORAL_TABLET | Freq: Two times a day (BID) | ORAL | Status: DC
  Administered 2021-09-23 – 2021-09-24 (×2): 12.5 mg via ORAL
  Filled 2021-09-23 (×2): qty 2

## 2021-09-23 MED ORDER — TAMSULOSIN HCL 0.4 MG PO CAPS
0.4000 mg | ORAL_CAPSULE | Freq: Every evening | ORAL | Status: DC
  Administered 2021-09-23: 0.4 mg via ORAL
  Filled 2021-09-23: qty 1

## 2021-09-23 MED ORDER — ONDANSETRON HCL 4 MG PO TABS: 8.0000 mg | ORAL_TABLET | Freq: Two times a day (BID) | ORAL | Status: DC | PRN

## 2021-09-23 MED ORDER — LORAZEPAM 0.5 MG PO TABS: 0.5000 mg | ORAL_TABLET | Freq: Four times a day (QID) | ORAL | Status: DC | PRN

## 2021-09-23 MED ORDER — MOMETASONE FURO-FORMOTEROL FUM 200-5 MCG/ACT IN AERO
2.0000 | INHALATION_SPRAY | Freq: Two times a day (BID) | RESPIRATORY_TRACT | Status: DC
  Filled 2021-09-23 (×2): qty 8.8

## 2021-09-23 MED ORDER — ALBUTEROL SULFATE HFA 108 (90 BASE) MCG/ACT IN AERS: 2.0000 | INHALATION_SPRAY | Freq: Four times a day (QID) | RESPIRATORY_TRACT | Status: DC | PRN

## 2021-09-23 MED ORDER — BUDESON-GLYCOPYRROL-FORMOTEROL 160-9-4.8 MCG/ACT IN AERO: 2.0000 | INHALATION_SPRAY | Freq: Two times a day (BID) | RESPIRATORY_TRACT | Status: DC

## 2021-09-23 MED ORDER — ATORVASTATIN CALCIUM 20 MG PO TABS
40.0000 mg | ORAL_TABLET | Freq: Every day | ORAL | Status: DC
  Administered 2021-09-23: 40 mg via ORAL
  Filled 2021-09-23: qty 2

## 2021-09-23 MED ORDER — ENSURE ENLIVE PO LIQD
237.0000 mL | Freq: Two times a day (BID) | ORAL | Status: DC
  Administered 2021-09-23: 237 mL via ORAL

## 2021-09-23 MED ORDER — SUCRALFATE 1 G PO TABS
1.0000 g | ORAL_TABLET | Freq: Three times a day (TID) | ORAL | Status: DC
  Administered 2021-09-24: 1 g via ORAL
  Filled 2021-09-23 (×2): qty 1

## 2021-09-23 MED ORDER — BACLOFEN 10 MG PO TABS
10.0000 mg | ORAL_TABLET | Freq: Three times a day (TID) | ORAL | Status: DC | PRN
  Filled 2021-09-23: qty 1

## 2021-09-23 MED ORDER — SACUBITRIL-VALSARTAN 49-51 MG PO TABS: 0.5000 | ORAL_TABLET | Freq: Two times a day (BID) | ORAL | Status: DC

## 2021-09-23 MED ORDER — LAMOTRIGINE 100 MG PO TABS
100.0000 mg | ORAL_TABLET | Freq: Two times a day (BID) | ORAL | Status: DC
  Administered 2021-09-23 – 2021-09-24 (×2): 100 mg via ORAL
  Filled 2021-09-23 (×2): qty 1

## 2021-09-23 MED ORDER — ALBUTEROL SULFATE (2.5 MG/3ML) 0.083% IN NEBU: 2.5000 mg | INHALATION_SOLUTION | Freq: Four times a day (QID) | RESPIRATORY_TRACT | Status: DC | PRN

## 2021-09-23 MED ORDER — SACUBITRIL-VALSARTAN 49-51 MG PO TABS
0.5000 | ORAL_TABLET | Freq: Two times a day (BID) | ORAL | Status: DC
  Administered 2021-09-24: 0.5 via ORAL
  Filled 2021-09-23: qty 0.5

## 2021-09-23 MED ORDER — LIDOCAINE 5 % EX PTCH
1.0000 | MEDICATED_PATCH | CUTANEOUS | Status: DC
  Administered 2021-09-23: 1 via TRANSDERMAL
  Filled 2021-09-23 (×2): qty 1

## 2021-09-23 MED ORDER — UMECLIDINIUM BROMIDE 62.5 MCG/ACT IN AEPB
1.0000 | INHALATION_SPRAY | Freq: Every day | RESPIRATORY_TRACT | Status: DC
  Filled 2021-09-23 (×2): qty 7

## 2021-09-23 MED ORDER — TRAMADOL HCL 50 MG PO TABS
50.0000 mg | ORAL_TABLET | Freq: Four times a day (QID) | ORAL | Status: DC | PRN
  Administered 2021-09-23: 50 mg via ORAL
  Filled 2021-09-23: qty 1

## 2021-09-23 MED ORDER — SODIUM CHLORIDE 0.9 % IV SOLN
10.0000 mL/h | Freq: Once | INTRAVENOUS | Status: AC
  Administered 2021-09-23: 10 mL/h via INTRAVENOUS

## 2021-09-23 NOTE — ED Provider Notes (Signed)
? ?[facility] ?Provider Note ? ? Event Date/Time  ? First MD Initiated Contact with Patient 09/23/21 1104   ?  (approximate) ?History  ?Epistaxis ? ?HPI ?Richard Wiggins is a 71 y.o. male with a stated past medical history of lung cancer currently undergoing chemotherapy and radiation who presents for epistaxis as well as worsening generalized weakness.  In reference to the epistaxis, patient states that since it has become cold again he has been having problems with a dry nose and when he woke up this morning he began having some bleeding from the right nostril.  Patient states that he is on Eliquis.  Patient also complains that he has had worsening generalized weakness to the point where he is unable to get himself up from a seated position to standing.  Patient denies any pain in the lower extremities or lower back.  Patient denies any missed or double doses of his anticoagulation ?Physical Exam  ?Triage Vital Signs: ?ED Triage Vitals  ?Enc Vitals Group  ?   BP 09/23/21 0932 138/87  ?   Pulse Rate 09/23/21 0932 81  ?   Resp 09/23/21 0932 18  ?   Temp 09/23/21 0932 98.5 ?F (36.9 ?C)  ?   Temp Source 09/23/21 0932 Oral  ?   SpO2 09/23/21 0932 96 %  ?   Weight 09/23/21 0934 195 lb (88.5 kg)  ?   Height 09/23/21 0934 5\' 10"  (1.778 m)  ?   Head Circumference --   ?   Peak Flow --   ?   Pain Score 09/23/21 0934 0  ?   Pain Loc --   ?   Pain Edu? --   ?   Excl. in [place]? --   ? ?Most recent vital signs: ?Vitals:  ? 09/23/21 1200 09/23/21 1230  ?BP: 138/68 130/73  ?Pulse: 71 67  ?Resp:    ?Temp:    ?SpO2: 100% 99%  ? ?General: Awake, oriented x4. ?CV:  Good peripheral perfusion.  ?Resp:  Normal effort.  ?Abd:  No distention.  ?Other:  Elderly Caucasian male sitting in bed in no distress with a napkin in the right nostril and clip in place to the nose.  When removed there is a small amount of of dried blood in the right nostril without any active bleeding appreciated ?ED Results / Procedures /  Treatments  ?Labs ?(all labs ordered are listed, but only abnormal results are displayed) ?Labs Reviewed  ?BRAIN NATRIURETIC PEPTIDE - Abnormal; Notable for the following components:  ?    Result Value  ? B Natriuretic Peptide 826.9 (*)   ? All other components within normal limits  ?COMPREHENSIVE METABOLIC PANEL - Abnormal; Notable for the following components:  ? Sodium 134 (*)   ? Glucose, Bld 101 (*)   ? BUN 28 (*)   ? Creatinine, Ser 1.37 (*)   ? Calcium 8.4 (*)   ? Total Protein 6.4 (*)   ? Albumin 2.7 (*)   ? GFR, Estimated 55 (*)   ? All other components within normal limits  ?CBC WITH DIFFERENTIAL/PLATELET - Abnormal; Notable for the following components:  ? WBC 2.2 (*)   ? RBC 2.45 (*)   ? Hemoglobin 6.9 (*)   ? HCT 22.0 (*)   ? RDW 16.5 (*)   ? Platelets 87 (*)   ? Lymphs Abs 0.3 (*)   ? All other components within normal limits  ?TROPONIN I (HIGH SENSITIVITY) - Abnormal; Notable for the  following components:  ? Troponin I (High Sensitivity) 29 (*)   ? All other components within normal limits  ?RESP PANEL BY RT-PCR (FLU A&B, COVID) ARPGX2  ?LIPASE, BLOOD  ?PHOSPHORUS  ?MAGNESIUM  ?PREPARE RBC (CROSSMATCH)  ?TYPE AND SCREEN  ?TROPONIN I (HIGH SENSITIVITY)  ? ?PROCEDURES: ?Critical Care performed: Yes, see critical care procedure note(s) ?.1-3 Lead EKG Interpretation ?Performed by: Naaman Plummer, MD ?Authorized by: Naaman Plummer, MD  ? ?  Interpretation: normal   ?  ECG rate:  66 ?  ECG rate assessment: normal   ?  Rhythm: sinus rhythm   ?  Ectopy: none   ?  Conduction: normal   ?CRITICAL CARE ?Performed by: Naaman Plummer ? ?Total critical care time: 31 minutes ? ?Critical care time was exclusive of separately billable procedures and treating other patients. ? ?Critical care was necessary to treat or prevent imminent or life-threatening deterioration. ? ?Critical care was time spent personally by me on the following activities: development of treatment plan with patient and/or surrogate as well as  nursing, discussions with consultants, evaluation of patient's response to treatment, examination of patient, obtaining history from patient or surrogate, ordering and performing treatments and interventions, ordering and review of laboratory studies, ordering and review of radiographic studies, pulse oximetry and re-evaluation of patient's condition. ? ?MEDICATIONS ORDERED IN ED: ?Medications  ?0.9 %  sodium chloride infusion (has no administration in time range)  ?lactated ringers bolus 1,000 mL (1,000 mLs Intravenous New Bag/Given 09/23/21 1301)  ? ?IMPRESSION / MDM / ASSESSMENT AND PLAN / ED COURSE  ?I reviewed the triage vital signs and the nursing notes. ?             ?               ?Differential diagnosis includes, but is not limited to, arrhythmia, ACS, worsening metastatic tumor burden, syndrome, dehydration, anemia ?The patient is on the cardiac monitor to evaluate for evidence of arrhythmia and/or significant heart rate changes. ?Patient is a 71 year old male with the above-stated past medical history who presents for worsening generalized weakness as well as an episode of epistaxis that began this morning.  The gauze was removed from patient's right nostril as well as the nasal clamp with no further bleeding appreciated.  Patient's laboratory evaluation significant for anemia to 6.9, mildly worsening renal function from baseline of 1.1-1.37 here today.  Hypocalcemia to 8.4, hypoproteinemia to 6.4.  Given the need for emergent blood transfusion for symptomatic anemia, multiple laboratory and electrolyte abnormalities concerning for malnutrition, and deconditioning to the point where patient cannot rise from a seated position on his own, he will need admission to the internal medicine service for further evaluation and management. ?Consult: ?Hospitalist-Dr. Roosevelt Locks agrees with plan for admission to the internal medicine service ? ?Dispo: Admit to medicine ? ?  ?FINAL CLINICAL IMPRESSION(S) / ED DIAGNOSES   ? ?Final diagnoses:  ?Right-sided epistaxis  ?Symptomatic anemia  ?Hypocalcemia  ?Hypoproteinemia ([place])  ?Dehydration  ? ?Rx / DC Orders  ? ?ED Discharge Orders   ? ? None  ? ?  ? ?Note:  This document was prepared using Dragon voice recognition software and may include unintentional dictation errors. ?  ?Naaman Plummer, MD ?09/23/21 1332 ? ?

## 2021-09-23 NOTE — ED Triage Notes (Signed)
Patient presents with nose bleed that started around 6:45am. Nose clamp placed on patient in triage. Patient reports taking eliquis. Last nose bleed X3 years ago. Last radiation last week. Hx lung cancer.  ?

## 2021-09-23 NOTE — Progress Notes (Signed)
Admission profile updated. ?

## 2021-09-23 NOTE — ED Notes (Signed)
Patient is back in bed resting with family member at bedside.Call bell is attached to bed. ?

## 2021-09-23 NOTE — Telephone Encounter (Signed)
Contacted PCP office and spoke to call center rep named Ashley--explained the wife is looking for additional help and based on pts insurance the chair lift and home health aide needs to come from the [place]. Caryl Pina stated she is sending a message to the PCP team and the nurses will handle the request. We can reach back out in a couple of days to check on the status.  ?

## 2021-09-23 NOTE — ED Notes (Signed)
Dietary called and will send a house tray for patient. ?

## 2021-09-23 NOTE — Telephone Encounter (Signed)
Richard Wiggins patient wife called reporting that they are on their way back to ER as patient has a nose bleed that will not stop and he is on Eliquis. It has been bleeding for over an hour now. I asked her to keep [place] informed and she stated that she will ?

## 2021-09-23 NOTE — ED Notes (Signed)
Assisted patient to the restroom. Patient will hit call bell when finished. ?

## 2021-09-23 NOTE — H&P (Addendum)
?History and Physical  ? ? ?Richard Wiggins YWV:[id number] DOB: 12/09/1950 DOA: 09/23/2021 ? ?PCP: [facility] (Confirm with patient/family/NH records and if not entered, this has to be entered at [facility] point of entry) ?Patient coming from: Home ? ?I have personally briefly reviewed patient's old medical records in [place] ? ?Chief Complaint: Nose bleeding, feeling weak ? ?HPI: Richard Wiggins is a 71 y.o. 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 ? ?Nosebleed started around 730 this morning, left-sided, severe, patient tried pressing and stopping the left side of the nose with little success.  Nosebleed continue at 930, when patient decided to come to the ED.  Local clamping was placed, and then the bleeding stopped.  2 weeks ago, patient underwent first cycles of chemotherapy including paclitaxel and carboplatin for 2 days, since then, patient has had significant decrease of appetite, frequent feeling of nauseous but no vomiting.  Other new symptoms including frequent hiccups, heartburning, for which patient was prescribed fluids of baclofen, sucralfate and Ativan, despite he continued experience the symptoms as above.  In the ED, patient complained about lightheaded, no chest pain no shortness of breath.  Blood pressure stable.  Patient had about 7-8 pound weight loss since January of this year.  Patient takes Eliquis for PAF, last dose of Eliquis was yesterday evening.  He denies any other significant bleeding, denies any black tarry stool, no abdominal pain. ? ?ED Course: No tachycardia, no hypotension, no hypoxia.  Hemoglobin 6.9, creatinine 1.3, K3.8.  WBC 2.2, platelet 87. ? ?Review of Systems: As  per HPI otherwise 14 point review of systems negative.  ? ?Past Medical History:  ?Diagnosis Date  ? AAA (abdominal aortic aneurysm) 2020  ? a. 2020 U/S: 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 -plan medical managmment with tight bp control; b. 09/2020 Abd U/S: 4.1cm.  ? Anxiety   ? Aortic atherosclerosis ([place])   ? Benign essential HTN 02/10/2020  ? BPH (benign prostatic hyperplasia) 02/09/2020  ? Brain aneurysm   ? Cancer of upper lobe of left lung ([place]) 04/2020  ? a.) ENB/EBUS performed; Bx non-diagnostic. b.) presumed stage I NSCLC in the LUL; underwent SBRT (60 Gy over 5 fractions)  ? Carotid arterial disease ([place])   ? a. 05/2017 s/p R carotid endarterectomy following CVA.  ? Chronic HFimpEF (heart failure with improved ejection fraction) ([place])   ? a. 2018 Echo: EF 30-35%; b. 02/2021 Echo: EF 55-60%, nl RV fxn. Triv MR. Asc Ao 70mm.  ? CKD (chronic kidney disease), stage III ([place])   ? Claudication [facility])   ? a. 09/2020 ABIs: R 0.91. L 0.99.  ? COPD (chronic obstructive pulmonary disease) ([place])   ? Coronary artery disease   ? a. 06/2017 PCI ([place]): s/p PCI to the RCA. LAD 50, LCX 20.  ? Depression   ? 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 a. 02/2021 Asc Ao 73mm.  ? Emphysema of lung ([place])   ? HLD (hyperlipidemia)   ? IDA (iron deficiency anemia) 02/09/2020  ? 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  ? Ischemic cardiomyopathy   ? Long term current use of anticoagulant   ? a.) apixaban  ? Marijuana use, continuous 02/10/2020  ? Osteoporosis   ?  PAF (paroxysmal atrial fibrillation) ([place])   ? a. 05/2017 Dx in setting of CVA. CHA2DS2VASc = 6-->Eliquis.  ? Pre-diabetes   ? Pulmonary emboli ([place]) 06/07/2017  ? Right lower lobe pulmonary embolism small segmental, multifocal multifocal pneumonia, mediastinal lymphadenopathy, moderate centrilobular emphysema  ? Seizures ([place])   ? Sigmoid diverticulosis   ? a.) CT 01/27/2021: severe  ? Stroke [facility])   ? a. 05/2017 - hospitalized in [place]->prolonged hospitalization in setting of  R CEA, PE, and finding of RCA dzs on cath; b. Residual left sided weakness to arm and leg.  ? ? ?Past Surgical History:  ?Procedure Laterality Date  ? brain aneurysm with clip    ? COLONOSCOPY WITH PROPOFOL N/A 07/23/2020  ? Procedure: COLONOSCOPY WITH PROPOFOL;  Surgeon: Lin Landsman, MD;  Location: [facility] ENDOSCOPY;  Service: Gastroenterology;  Laterality: N/A;  ? CORONARY ANGIOPLASTY WITH STENT PLACEMENT  07/08/2017  ? PORTA CATH INSERTION N/A 08/31/2021  ? Procedure: PORTA CATH INSERTION;  Surgeon: Algernon Huxley, MD;  Location: [facility];  Service: Cardiovascular;  Laterality: N/A;  ? TOTAL HIP ARTHROPLASTY Right   ? VIDEO BRONCHOSCOPY WITH ENDOBRONCHIAL NAVIGATION N/A 04/23/2020  ? Procedure: VIDEO BRONCHOSCOPY WITH ENDOBRONCHIAL NAVIGATION;  Surgeon: Tyler Pita, MD;  Location: [facility] ORS;  Service: Pulmonary;  Laterality: N/A;  ? VIDEO BRONCHOSCOPY WITH ENDOBRONCHIAL ULTRASOUND Left 08/12/2021  ? Procedure: VIDEO BRONCHOSCOPY WITH ENDOBRONCHIAL ULTRASOUND;  Surgeon: Tyler Pita, MD;  Location: [facility] ORS;  Service: Cardiopulmonary;  Laterality: Left;  ? ? ? reports that he quit smoking about 4 years ago. His smoking use included cigarettes. He has a 106.00 pack-year smoking history. He has never used smokeless tobacco. He reports current alcohol use. He reports that he does not currently use drugs after having used the following drugs: Marijuana. ? ?Allergies  ?Allergen Reactions  ? Tape Rash  ?  plastic  ? Wound Dressing Adhesive Itching and Rash  ?  Gets stuck to skin, makes wound spread  ? ? ?Family History  ?Problem Relation Age of Onset  ? Diabetes Mother   ? Heart disease Mother   ? Stroke Father   ? Heart disease Father   ? Heart attack Father   ? Alcohol abuse Father   ? Heart disease Sister   ? Heart attack Sister   ? Heart disease Maternal Grandmother   ? Heart attack Maternal Grandmother   ? Heart attack Paternal Grandmother   ? Diabetes Brother   ? Heart disease Brother   ?  Heart attack Brother   ? ? ? ?Prior to Admission medications   ?Medication Sig Start Date End Date Taking? Authorizing Provider  ?albuterol (PROVENTIL) (2.5 MG/3ML) 0.083% nebulizer solution Take 3 mLs (2.5 mg total) by nebulization every 6 (six) hours as needed for wheezing or shortness of breath. 10/13/20   Tyler Pita, MD  ?albuterol (VENTOLIN HFA) 108 (90 Base) MCG/ACT inhaler Inhale 2 puffs into the lungs every 6 (six) hours as needed for wheezing or shortness of breath. 07/21/21   Tyler Pita, MD  ?apixaban (ELIQUIS) 5 MG TABS tablet TAKE 1 TABLET(5 MG) BY MOUTH IN THE MORNING AND AT BEDTIME 08/20/21   Kate Sable, MD  ?aspirin 81 MG chewable tablet CHEW ONE TABLET BY MOUTH DAILY FOR HEART 09/11/21   [provider]  ?atorvastatin (LIPITOR) 40 MG tablet TAKE 1 TABLET(40 MG) BY MOUTH AT BEDTIME 11/17/20   Chrismon, Dennis E, PA-C  ?atorvastatin (LIPITOR) 40 MG tablet TAKE ONE TABLET  BY MOUTH AT BEDTIME FOR CHOLESTEROL 09/11/21   [provider]  ?baclofen (LIORESAL) 10 MG tablet Take 1 tablet (10 mg total) by mouth 3 (three) times daily as needed for muscle spasms. 09/12/21   Sindy Guadeloupe, MD  ?Budeson-Glycopyrrol-Formoterol (BREZTRI AEROSPHERE) 160-9-4.8 MCG/ACT AERO Inhale 2 puffs into the lungs in the morning and at bedtime. 12/30/20   Tyler Pita, MD  ?carvedilol (COREG) 25 MG tablet Take 0.5 tablets (12.5 mg total) by mouth in the morning and at bedtime. 09/14/21   Kate Sable, MD  ?dexamethasone (DECADRON) 4 MG tablet Take 2 tablets (8 mg total) by mouth daily. Start the day after chemotherapy for 2 days. 08/27/21   Sindy Guadeloupe, MD  ?furosemide (LASIX) 40 MG tablet Take 1 tablet (40 mg total) by mouth as needed. ?Patient not taking: Reported on 09/16/2021 03/13/21   Kate Sable, MD  ?lamoTRIgine (LAMICTAL) 100 MG tablet Take 100 mg by mouth 2 (two) times daily. 08/22/20   [provider]  ?Lidocaine HCl-Benzyl Alcohol (SALONPAS LIDOCAINE PLUS EX) Place 1  patch onto the skin daily as needed (pain.). ?Patient not taking: Reported on 09/16/2021    [provider]  ?lidocaine-prilocaine (EMLA) cream Apply to affected area once 08/27/21   Judyann Munson

## 2021-09-23 NOTE — Evaluation (Signed)
Physical Therapy Evaluation ?Patient Details ?Name: Richard Wiggins ?MRN: [id number] ?DOB: [birth year] ?Today's Date: 09/23/2021 ? ?History of Present Illness ? 71 y.o. male here with weakness, admitted with anemia.  Past medical history of lung cancer currently undergoing chemotherapy and radiation who presents for epistaxis as well as worsening generalized weakness, reports newer onset R groin hernia.  ?Clinical Impression ? Pt did well with PT, reports some general weakness and ongoing R groin/hernia pain but was able to ambulate >100 ft relatively well and with stable vitals.  He did require a walker and does not typically need an AD, safe with no LOBs with the AD.  Pt is not at his baseline and will benefit from HHPT, however he should be safe to return home once medically cleared, wife available 24/7 as she works from home.   ?   ? ?Recommendations for follow up therapy are one component of a multi-disciplinary discharge planning process, led by the attending physician.  Recommendations may be updated based on patient status, additional functional criteria and insurance authorization. ? ?Follow Up Recommendations Home health PT (through the [facility]?) ? ?  ?Assistance Recommended at Discharge PRN  ?Patient can return home with the following ?   ? ?  ?Equipment Recommendations BSC/3in1  ?Recommendations for Other Services ?    ?  ?Functional Status Assessment Patient has had a recent decline in their functional status and demonstrates the ability to make significant improvements in function in a reasonable and predictable amount of time.  ? ?  ?Precautions / Restrictions Precautions ?Precautions: Fall ?Restrictions ?Weight Bearing Restrictions: No  ? ?  ? ?Mobility ? Bed Mobility ?Overal bed mobility: Modified Independent ?  ?  ?  ?  ?  ?  ?General bed mobility comments: used UEs to get R LE out of/back into bed ?  ? ?Transfers ?Overall transfer level: Modified independent ?Equipment used: Rolling walker (2 wheels) ?  ?   ?  ?  ?  ?  ?  ?General transfer comment: cues for set up/UE use but able to rise w/o direct assist from standard height ED gurney ?  ? ?Ambulation/Gait ?Ambulation/Gait assistance: Supervision ?Gait Distance (Feet): 150 Feet ?Assistive device: Rolling walker (2 wheels) ?  ?  ?  ?  ?General Gait Details: Pt was able to ambulate with appropriate speed and confidence, no LOBs or excessive fatigue.  He does not typically need AD, however clearly reliant on RW during the effort.  O2 and HR stable t/o. ? ?Stairs ?  ?  ?  ?  ?  ? ?Wheelchair Mobility ?  ? ?Modified Rankin (Stroke Patients Only) ?  ? ?  ? ?Balance Overall balance assessment: Needs assistance ?Sitting-balance support: No upper extremity supported ?Sitting balance-Leahy Scale: Good ?  ?  ?Standing balance support: Bilateral upper extremity supported ?Standing balance-Leahy Scale: Good ?Standing balance comment: attempts at static standing w/o UE support displayed unsteadiness ?  ?  ?  ?  ?  ?  ?  ?  ?  ?  ?  ?   ? ? ? ?Pertinent Vitals/Pain Pain Assessment ?Pain Assessment: 0-10 ?Pain Score: 3  ?Pain Location: R groin  ? ? ?Home Living Family/patient expects to be discharged to:: Private residence ?Living Arrangements: Spouse/significant other ?Available Help at Discharge: Family;Available 24 hours/day ?  ?Home Access: Level entry (small threshold) ?  ?  ?  ?Home Layout: Able to live on main level with bedroom/bathroom ?Home Equipment: Conservation officer, nature (2 wheels) ?   ?  ?  Prior Function Prior Level of Function : Independent/Modified Independent ?  ?  ?  ?  ?  ?  ?Mobility Comments: Pt reports that until this last week or 2 he has not need AD and was able to be relatively active ?  ?  ? ? ?Hand Dominance  ?   ? ?  ?Extremity/Trunk Assessment  ? Upper Extremity Assessment ?Upper Extremity Assessment: Overall WFL for tasks assessed ?  ? ?Lower Extremity Assessment ?Lower Extremity Assessment: RLE deficits/detail;Generalized weakness ?RLE Deficits / Details:  pain/guarding with R hip flexion (unable to SLR), grossly 3+/5 with hip Ab/Ad ?  ? ?   ?Communication  ? Communication: No difficulties  ?Cognition Arousal/Alertness: Awake/alert ?Behavior During Therapy: [facility] for tasks assessed/performed ?Overall Cognitive Status: Within Functional Limits for tasks assessed ?  ?  ?  ?  ?  ?  ?  ?  ?  ?  ?  ?  ?  ?  ?  ?  ?  ?  ?  ? ?  ?General Comments General comments (skin integrity, edema, etc.): Pt's HGB 6.9, to get transfusion post PT exam ? ?  ?Exercises    ? ?Assessment/Plan  ?  ?PT Assessment Patient needs continued PT services  ?PT Problem List Decreased strength;Decreased range of motion;Decreased activity tolerance;Decreased balance;Decreased mobility;Decreased knowledge of use of DME;Decreased safety awareness;Pain ? ?   ?  ?PT Treatment Interventions DME instruction;Gait training;Functional mobility training;Therapeutic activities;Therapeutic exercise;Balance training;Patient/family education   ? ?PT Goals (Current goals can be found in the Care Plan section)  ?Acute Rehab PT Goals ?Patient Stated Goal: go home ?PT Goal Formulation: With patient ?Time For Goal Achievement: 10/07/21 ?Potential to Achieve Goals: Good ? ?  ?Frequency Min 2X/week ?  ? ? ?Co-evaluation   ?  ?  ?  ?  ? ? ?  ?AM-PAC PT "6 Clicks" Mobility  ?Outcome Measure Help needed turning from your back to your side while in a flat bed without using bedrails?: None ?Help needed moving from lying on your back to sitting on the side of a flat bed without using bedrails?: A Little ?Help needed moving to and from a bed to a chair (including a wheelchair)?: None ?Help needed standing up from a chair using your arms (e.g., wheelchair or bedside chair)?: None ?Help needed to walk in hospital room?: A Little ?Help needed climbing 3-5 steps with a railing? : A Little ?6 Click Score: 21 ? ?  ?End of Session Equipment Utilized During Treatment: Gait belt ?Activity Tolerance: Patient limited by fatigue;Patient  tolerated treatment well ?Patient left: in bed;with call bell/phone within reach;with nursing/sitter in room;with family/visitor present ?Nurse Communication: Mobility status ?PT Visit Diagnosis: Muscle weakness (generalized) (M62.81);Difficulty in walking, not elsewhere classified (R26.2);Pain ?Pain - Right/Left: Right ?Pain - part of body: Hip ?  ? ?Time: 3818-2993 ?PT Time Calculation (min) (ACUTE ONLY): 25 min ? ? ?Charges:   PT Evaluation ?$PT Eval Low Complexity: 1 Low ?PT Treatments ?$Gait Training: 8-22 mins ?  ?   ? ? ?Kreg Shropshire, DPT ?09/23/2021, 4:12 PM ? ?

## 2021-09-24 ENCOUNTER — Ambulatory Visit: Payer: No Typology Code available for payment source

## 2021-09-24 ENCOUNTER — Encounter: Payer: Self-pay | Admitting: Oncology

## 2021-09-24 DIAGNOSIS — D649 Anemia, unspecified: Secondary | ICD-10-CM | POA: Diagnosis not present

## 2021-09-24 DIAGNOSIS — I5022 Chronic systolic (congestive) heart failure: Secondary | ICD-10-CM | POA: Diagnosis not present

## 2021-09-24 DIAGNOSIS — E860 Dehydration: Secondary | ICD-10-CM | POA: Diagnosis not present

## 2021-09-24 DIAGNOSIS — J449 Chronic obstructive pulmonary disease, unspecified: Secondary | ICD-10-CM | POA: Diagnosis not present

## 2021-09-24 LAB — CULTURE, BLOOD (ROUTINE X 2): Culture: NO GROWTH

## 2021-09-24 LAB — TYPE AND SCREEN
ABO/RH(D): B POS
Antibody Screen: NEGATIVE
Unit division: 0

## 2021-09-24 LAB — CBC
HCT: 24.5 % — ABNORMAL LOW (ref 39.0–52.0)
Hemoglobin: 7.9 g/dL — ABNORMAL LOW (ref 13.0–17.0)
MCH: 28.2 pg (ref 26.0–34.0)
MCHC: 32.2 g/dL (ref 30.0–36.0)
MCV: 87.5 fL (ref 80.0–100.0)
Platelets: 85 10*3/uL — ABNORMAL LOW (ref 150–400)
RBC: 2.8 MIL/uL — ABNORMAL LOW (ref 4.22–5.81)
RDW: 16.1 % — ABNORMAL HIGH (ref 11.5–15.5)
WBC: 2.7 10*3/uL — ABNORMAL LOW (ref 4.0–10.5)
nRBC: 0 % (ref 0.0–0.2)

## 2021-09-24 LAB — BASIC METABOLIC PANEL
Anion gap: 7 (ref 5–15)
BUN: 20 mg/dL (ref 8–23)
CO2: 24 mmol/L (ref 22–32)
Calcium: 8.1 mg/dL — ABNORMAL LOW (ref 8.9–10.3)
Chloride: 104 mmol/L (ref 98–111)
Creatinine, Ser: 1 mg/dL (ref 0.61–1.24)
GFR, Estimated: >60 mL/min (ref >60)
Glucose, Bld: 104 mg/dL — ABNORMAL HIGH (ref 70–99)
Potassium: 4 mmol/L (ref 3.5–5.1)
Sodium: 135 mmol/L (ref 135–145)

## 2021-09-24 LAB — BPAM RBC
Blood Product Expiration Date: 202304032359
ISSUE DATE / TIME: 202303151456
Unit Type and Rh: 7300

## 2021-09-24 NOTE — Progress Notes (Signed)
[facility]) Hospital Liaison note: ? ?Notified by Dr Max Sane of request for [place] services. Will continue to follow for disposition. ? ?Please call with any outpatient palliative questions or concerns. ? ?Thank you for the opportunity to participate in this patient's care. ? ?Thank you, ?Lorelee Market, LPN ?[facility] Hospital Liaison ?[phone] ?

## 2021-09-24 NOTE — TOC Initial Note (Signed)
Transition of Care (TOC) - Initial/Assessment Note  ? ? ?Patient Details  ?Name: Richard Wiggins ?MRN: [id number] ?Date of Birth: 05-17-1951 ? ?Transition of Care (TOC) CM/SW Contact:    ?Pete Pelt, RN ?Phone Number: ?09/24/2021, 9:42 AM ? ?Clinical Narrative:    patient lives at home with wife, who he states can assist him if needed.  Patient denies concerns with transportation and states he is up to date with PCP.  Denies concerns with medications. ? ?Patient accepts 3 n 1 from [facility], Danielle to deliver to room prior to discharge.  Home Health with [facility], they will accept him back as per [place]. ? ?Patient denies other discharge concerns.             ? ? ?Expected Discharge Plan: [place] ?Barriers to Discharge: Barriers Resolved ? ? ?Patient Goals and CMS Choice ?  ?  ?Choice offered to / list presented to : NA ? ?Expected Discharge Plan and Services ?Expected Discharge Plan: [place] ?  ?Discharge Planning Services: CM Consult ?Post Acute Care Choice: Durable Medical Equipment ?Living arrangements for the past 2 months: [place] ?Expected Discharge Date: 09/24/21               ?DME Arranged: 3-N-1 ?DME Agency: [facility] ?Date DME Agency Contacted: 09/24/21 ?Time DME Agency Contacted: [phone] ?Representative spoke with at DME Agency: danielle ?HH Arranged: PT, OT ?[place] Agency: [place] ([place]) ?Date HH Agency Contacted: 09/24/21 ?Time [place]: [phone] ?Representative spoke with at [place]: Corene Cornea ? ?Prior Living Arrangements/Services ?Living arrangements for the past 2 months: [place] ?Lives with:: Self, Spouse ?Patient language and need for interpreter reviewed:: Yes (No interpreter required) ?Do you feel safe going back to the place where you live?: Yes      ?Need for Family Participation in Patient Care: Yes (Comment) ?Care giver support system in place?: Yes (comment) ?Current home services: DME, Home OT, Home PT ?Criminal  Activity/Legal Involvement Pertinent to Current Situation/Hospitalization: No - Comment as needed ? ?Activities of Daily Living ?Home Assistive Devices/Equipment: [place] Rile (specify type), Dentures (specify type), Eyeglasses ?ADL Screening (condition at time of admission) ?Patient's cognitive ability adequate to safely complete daily activities?: Yes ?Is the patient deaf or have difficulty hearing?: No ?Does the patient have difficulty seeing, even when wearing glasses/contacts?: No ?Does the patient have difficulty concentrating, remembering, or making decisions?: No ?Patient able to express need for assistance with ADLs?: Yes ?Does the patient have difficulty dressing or bathing?: No ?Independently performs ADLs?: Yes (appropriate for developmental age) ?Does the patient have difficulty walking or climbing stairs?: Yes ?Weakness of Legs: Both ?Weakness of Arms/Hands: Both ? ?Permission Sought/Granted ?Permission sought to share information with : Case Manager ?Permission granted to share information with : Yes, Verbal Permission Granted ?   ? Permission granted to share info w AGENCY: DME and Home Health agency ?   ?   ? ?Emotional Assessment ?Appearance:: Appears stated age ?Attitude/Demeanor/Rapport: Gracious, Engaged ?Affect (typically observed): Pleasant, Appropriate ?Orientation: : Oriented to Self, Oriented to Place, Oriented to  Time, Oriented to Situation ?Alcohol / Substance Use: Not Applicable ?Psych Involvement: No (comment) ? ?Admission diagnosis:  Hypocalcemia [E83.51] ?Dehydration [E86.0] ?Hypoproteinemia ([place]) [E77.8] ?Anemia [D64.9] ?Right-sided epistaxis [R04.0] ?Symptomatic anemia [D64.9] ?Patient Active Problem List  ? Diagnosis Date Noted  ? Dehydration   ? Epistaxis 09/23/2021  ? Anemia 09/23/2021  ? Lung cancer ([place]) 08/18/2021  ? Renal artery stenosis ([place]) 08/11/2021  ?  CKD (chronic kidney disease), stage III ([place]) 03/11/2021  ? 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  Iron deficiency anemia 02/04/2021  ? Long term (current) use of anticoagulants 11/12/2020  ? Positive colorectal cancer screening using Cologuard test 05/22/2020  ? Goals of care, counseling/discussion 05/06/2020  ? Malignant neoplasm of upper lobe of left lung ([place]) 05/06/2020  ? Preventative health care 03/26/2020  ? Atherosclerosis of native arteries of extremity with intermittent claudication ([place]) 03/14/2020  ? Trigger middle finger of right hand 02/20/2020  ? Abdominal aortic aneurysm (AAA) without rupture 02/20/2020  ? 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  ? Benign essential HTN 02/10/2020  ? Pre-diabetes 02/10/2020  ? BPH (benign prostatic hyperplasia) 02/09/2020  ? Pancytopenia ([place]) 02/09/2020  ? Weakness of lower extremity 02/01/2020  ? COPD (chronic obstructive pulmonary disease) ([place]) 02/01/2020  ? Chronic systolic CHF (congestive heart failure) ([place]) 02/01/2020  ? Depression 02/01/2020  ? A-fib ([place]) 02/01/2020  ? CVA (cerebral vascular accident) ([place]) 02/01/2020  ? Coronary artery disease 02/01/2020  ? Carotid artery stenosis 02/01/2020  ? HLD (hyperlipidemia) 02/01/2020  ? Carotid stenosis, asymptomatic, left 02/14/2018  ? S/P coronary artery stent placement 09/20/2017  ? Centrilobular emphysema ([place]) 06/10/2017  ? Chronic back pain 06/10/2017  ? DOE (dyspnea on exertion) 06/10/2017  ? Elevated TSH 06/10/2017  ? Hematoma of groin 06/10/2017  ? PAD (peripheral artery disease) ([place]) 06/10/2017  ? Pulmonary embolism ([place]) 06/10/2017  ? Tobacco abuse 06/10/2017  ? Unintended weight loss 06/10/2017  ? AAA (abdominal aortic aneurysm) 06/10/2017  ? PAF (paroxysmal atrial fibrillation) ([place]) 06/10/2017  ? Systolic CHF, chronic ([place]) 06/10/2017  ? Hypertension 05/31/2017  ? Other hyperlipidemia 05/31/2017  ? Hemiparesis affecting left side as late effect of cerebrovascular accident ([place]) 05/29/2017  ? Carotid artery stenosis, symptomatic, right 05/29/2017  ? Embolic stroke involving right middle  cerebral artery ([place]) 05/28/2017  ? ?PCP:  [facility] ?Pharmacy:   ?[facility] [place] ?[place] ?[place] [address] ?Phone: [phone] Fax: [phone] ? ?[facility] [phone] - [place], [place] [address] ?[place] [address] ?Phone: [phone] Fax: [phone] ? ? ? ? ?Social Determinants of Health (SDOH) Interventions ?  ? ?Readmission Risk Interventions ?No flowsheet data found. ? ? ?

## 2021-09-25 ENCOUNTER — Ambulatory Visit: Payer: No Typology Code available for payment source

## 2021-09-25 NOTE — Discharge Summary (Signed)
?Physician Discharge Summary ?  ?Patient: Richard Wiggins MRN: [id number] DOB: [birth year]  ?Admit date:     09/23/2021  ?Discharge date: 09/24/2021  ?Discharge Physician: Max Sane  ? ?PCP: [facility]  ? ?Recommendations at discharge:  ? ?Follow-up with outpatient providers as requested ? ?Discharge Diagnoses: ?Principal Problem: ?  Anemia ?Active Problems: ?  COPD (chronic obstructive pulmonary disease) ([place]) ?  Chronic systolic CHF (congestive heart failure) ([place]) ?  Pancytopenia ([place]) ?  PAF (paroxysmal atrial fibrillation) ([place]) ?  Lung cancer ([place]) ?  Epistaxis ?  Dehydration ? ? ?Assessment and Plan: ?71 y.o. 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 ? ?Acute on chronic symptomatic anemia secondary to epistaxis ?Has baseline chronic normocytic anemia secondary to CKD.  His admission hemoglobin was 6.9 for which he received 1 PRBC transfusion and his hemoglobin was 7.9 on the day of discharge.  Patient was feeling much better and requested to release him home.  He is not interested in any further work-up while in the hospital ? ?Dehydration ?Lasix and Entresto held while in the hospital..  Hydrated with IV fluids and his volume status is euvolemic on the day of discharge ? ?Chronic HFrEF ?-Well compensated at this time ?  ?PAF ?-Eliquis was hold for 48 hours.  With no further epistaxis and stable H&H this can be resumed at discharge.  Patient was instructed to monitor for any bleeding or epistaxis while on Eliquis ?  ?COPD ?-Stable, no acute issue, continue as needed breathing meds and maintenance breathing meds. ?  ?CKD stage IIIa ?-At baseline on the day of discharge with IV hydration ?   ?HTN ?-Controlled ?  ?Moderate protein calorie malnutrition ?-Secondary to chemo therapy ?  ?Pancytopenia ?-WBC and platelet trending down compatible with bone marrow suppression from chemotherapy 2 weeks ago, no symptoms signs of active infection, recommend outpatient follow-up with oncologist for possible Neupogen if indicated. ?  ?Stage I lung cancer s/p radiation, now with recurrence ?-Outpatient follow-up with oncology, expected second round of chemo in 2 weeks. ?  ?GERD ?-Continue H2 blocker and sucralfate ? ?  ? ? ?Disposition: Home health ?Diet recommendation:  ?Discharge Diet Orders (From admission, onward)  ? ?  Start     Ordered  ? 09/24/21 0000  Diet - low sodium heart healthy       ? 09/24/21 2992  ? ?  ?  ? ?  ? ?Carb modified diet ?DISCHARGE MEDICATION: ?Allergies as of 09/24/2021   ? ?   Reactions  ? Tape Rash  ? plastic  ? Wound Dressing Adhesive Itching, Rash  ? Gets stuck to skin, makes wound spread  ? ?  ? ?  ?Medication List  ?  ? ?STOP taking these medications   ? ?spironolactone 25 MG tablet ?Commonly known as: ALDACTONE ?  ? ?  ? ?TAKE these medications   ? ?albuterol (2.5 MG/3ML) 0.083% nebulizer solution ?Commonly known as: PROVENTIL ?Take 3 mLs (2.5 mg total) by nebulization every 6 (six) hours as needed for wheezing or shortness of breath. ?  ?albuterol 108 (90 Base) MCG/ACT inhaler ?Commonly known as: VENTOLIN HFA ?Inhale 2 puffs into the lungs every 6 (six) hours as needed for wheezing or shortness  of breath. ?  ?apixaban 5 MG Tabs tablet ?Commonly known as: Eliquis ?TAKE 1 TABLET(5 MG) BY MOUTH IN THE MORNING AND AT BEDTIME ?  ?aspirin 81 MG chewable tablet ?CHEW ONE TABLET BY MOUTH DAILY FOR HEART ?  ?atorvastatin 40 MG tablet ?Commonly known as: LIPITOR ?TAKE 1 TABLET(40 MG) BY MOUTH AT BEDTIME ?What changed: Another medication with the same name was removed. Continue taking this medication, and follow the directions you see here. ?  ?baclofen 10 MG tablet ?Commonly known as:  LIORESAL ?Take 1 tablet (10 mg total) by mouth 3 (three) times daily as needed for muscle spasms. ?  ?Breztri Aerosphere 160-9-4.8 MCG/ACT Aero ?Generic drug: Budeson-Glycopyrrol-Formoterol ?Inhale 2 puffs into the lungs in the morning and at bedtime. ?  ?carvedilol 25 MG tablet ?Commonly known as: COREG ?Take 0.5 tablets (12.5 mg total) by mouth in the morning and at bedtime. ?  ?dexamethasone 4 MG tablet ?Commonly known as: DECADRON ?Take 2 tablets (8 mg total) by mouth daily. Start the day after chemotherapy for 2 days. ?  ?furosemide 40 MG tablet ?Commonly known as: LASIX ?Take 1 tablet (40 mg total) by mouth as needed. ?  ?lamoTRIgine 100 MG tablet ?Commonly known as: LAMICTAL ?Take 100 mg by mouth 2 (two) times daily. ?  ?lidocaine-prilocaine cream ?Commonly known as: EMLA ?Apply to affected area once ?  ?LORazepam 0.5 MG tablet ?Commonly known as: Ativan ?Take 1 tablet (0.5 mg total) by mouth every 6 (six) hours as needed (Nausea or vomiting). ?  ?Multi-Vitamin tablet ?Take 1 tablet by mouth daily. ?  ?ondansetron 8 MG tablet ?Commonly known as: Zofran ?Take 1 tablet (8 mg total) by mouth 2 (two) times daily as needed for refractory nausea / vomiting. Start on day 3 after chemo. ?  ?prochlorperazine 10 MG tablet ?Commonly known as: COMPAZINE ?Take 1 tablet (10 mg total) by mouth every 6 (six) hours as needed (Nausea or vomiting). ?  ?sacubitril-valsartan 49-51 MG ?Commonly known as: ENTRESTO ?Take 0.5 tablets by mouth in the morning and at bedtime. ?  ?sucralfate 1 g tablet ?Commonly known as: Carafate ?Take 1 tablet (1 g total) by mouth 3 (three) times daily. Dissolve in 3-4 tbsp warm water, swish and swallow. ?  ?tamsulosin 0.4 MG Caps capsule ?Commonly known as: FLOMAX ?Take 1 capsule (0.4 mg total) by mouth daily. ?  ?traMADol 50 MG tablet ?Commonly known as: Ultram ?Take 1 tablet (50 mg total) by mouth every 6 (six) hours as needed. ?  ? ?  ? ? Follow-up Information   ? ? [facility]  Schedule an appointment as soon as possible for a visit in 1 week(s).   ?Why: [facility] Discharge F/UP ?[facility] care team to call patient to set up follow up appointment ?Contact information: ?[address] ?[place] [address] ?[phone] ? ? ?  ?  ? ? Kate Sable, MD. Schedule an appointment as soon as possible for a visit in 3 week(s).   ?Specialties: Cardiology, Radiology ?Why: [facility] Discharge F/UP ?Contact information: ?[place] [address] ?[phone] ? ? ?  ?  ? ? Sindy Guadeloupe, MD. Schedule an appointment as soon as possible for a visit in 1 week(s).   ?Specialty: Oncology ?Why: [facility] Discharge F/UP ?Contact information: ?[place] [address] ?[phone] ? ? ?  ?  ? ? Tyler Pita, MD. Schedule an appointment as soon as possible for a visit in 2 week(s).   ?  Specialty: Pulmonary Disease ?Why: [facility] Discharge F/UP ?Contact information: ?[address] ?[place] [address] ?[phone] ? ? ?  ?  ? ?  ?  ? ?  ? ?Discharge Exam: ?Filed Weights  ? 09/23/21 0934  ?Weight: 88.5 kg  ? ?Eyes: PERRL, lids and conjunctivae normal ?ENMT:  Posterior pharynx clear of any exudate or lesions.Normal dentition.  ?Neck: normal, supple, no masses, no thyromegaly ?Respiratory: clear to auscultation bilaterally, no wheezing, no crackles. Normal respiratory effort. No accessory muscle use.  ?Cardiovascular: Regular rate and rhythm, no murmurs / rubs / gallops. No extremity edema. 2+ pedal pulses. No carotid bruits.  ?Abdomen: no tenderness, no masses palpated. No hepatosplenomegaly. Bowel sounds positive.  ?Musculoskeletal: no clubbing / cyanosis. No joint deformity upper and lower extremities. Good ROM, no contractures. Normal muscle tone.  ?Skin: no rashes, lesions, ulcers. No induration ?Neurologic: CN 2-12 grossly intact. Sensation intact, DTR normal. Strength 5/5 in all 4.  ?Psychiatric: Normal judgment  and insight. Alert and oriented x 3. Normal mood.  ? ?Condition at discharge: good ? ?The results of significant diagnostics from this hospitalization (including imaging, microbiology, ancillary and laboratory) are listed below for ref

## 2021-09-28 ENCOUNTER — Encounter: Payer: Self-pay | Admitting: Cardiology

## 2021-09-28 ENCOUNTER — Ambulatory Visit: Payer: No Typology Code available for payment source

## 2021-09-29 ENCOUNTER — Inpatient Hospital Stay: Payer: No Typology Code available for payment source

## 2021-09-29 ENCOUNTER — Other Ambulatory Visit: Payer: Self-pay

## 2021-09-29 ENCOUNTER — Encounter: Payer: Self-pay | Admitting: Oncology

## 2021-09-29 ENCOUNTER — Inpatient Hospital Stay (HOSPITAL_BASED_OUTPATIENT_CLINIC_OR_DEPARTMENT_OTHER): Payer: No Typology Code available for payment source | Admitting: Oncology

## 2021-09-29 ENCOUNTER — Ambulatory Visit: Payer: No Typology Code available for payment source

## 2021-09-29 ENCOUNTER — Other Ambulatory Visit (INDEPENDENT_AMBULATORY_CARE_PROVIDER_SITE_OTHER): Payer: Self-pay | Admitting: Vascular Surgery

## 2021-09-29 VITALS — BP 146/76 | HR 78 | Temp 98.7°F | Resp 19 | Wt 197.3 lb

## 2021-09-29 DIAGNOSIS — Z95828 Presence of other vascular implants and grafts: Secondary | ICD-10-CM

## 2021-09-29 DIAGNOSIS — C3490 Malignant neoplasm of unspecified part of unspecified bronchus or lung: Secondary | ICD-10-CM

## 2021-09-29 DIAGNOSIS — Z5111 Encounter for antineoplastic chemotherapy: Secondary | ICD-10-CM | POA: Diagnosis not present

## 2021-09-29 DIAGNOSIS — I739 Peripheral vascular disease, unspecified: Secondary | ICD-10-CM

## 2021-09-29 DIAGNOSIS — D649 Anemia, unspecified: Secondary | ICD-10-CM

## 2021-09-29 DIAGNOSIS — C3412 Malignant neoplasm of upper lobe, left bronchus or lung: Secondary | ICD-10-CM | POA: Diagnosis not present

## 2021-09-29 DIAGNOSIS — I714 Abdominal aortic aneurysm, without rupture, unspecified: Secondary | ICD-10-CM

## 2021-09-29 DIAGNOSIS — R5383 Other fatigue: Secondary | ICD-10-CM

## 2021-09-29 LAB — CBC WITH DIFFERENTIAL/PLATELET
Abs Immature Granulocytes: 0.02 10*3/uL (ref 0.00–0.07)
Basophils Absolute: 0 10*3/uL (ref 0.0–0.1)
Basophils Relative: 0 %
Eosinophils Absolute: 0 10*3/uL (ref 0.0–0.5)
Eosinophils Relative: 1 %
HCT: 25.4 % — ABNORMAL LOW (ref 39.0–52.0)
Hemoglobin: 7.9 g/dL — ABNORMAL LOW (ref 13.0–17.0)
Immature Granulocytes: 1 %
Lymphocytes Relative: 12 %
Lymphs Abs: 0.3 10*3/uL — ABNORMAL LOW (ref 0.7–4.0)
MCH: 28.5 pg (ref 26.0–34.0)
MCHC: 31.1 g/dL (ref 30.0–36.0)
MCV: 91.7 fL (ref 80.0–100.0)
Monocytes Absolute: 0.2 10*3/uL (ref 0.1–1.0)
Monocytes Relative: 8 %
Neutro Abs: 1.9 10*3/uL (ref 1.7–7.7)
Neutrophils Relative %: 78 %
Platelets: 115 10*3/uL — ABNORMAL LOW (ref 150–400)
RBC: 2.77 MIL/uL — ABNORMAL LOW (ref 4.22–5.81)
RDW: 16.2 % — ABNORMAL HIGH (ref 11.5–15.5)
WBC: 2.4 10*3/uL — ABNORMAL LOW (ref 4.0–10.5)
nRBC: 0 % (ref 0.0–0.2)

## 2021-09-29 LAB — COMPREHENSIVE METABOLIC PANEL
ALT: 36 U/L (ref 0–44)
AST: 45 U/L — ABNORMAL HIGH (ref 15–41)
Albumin: 2.5 g/dL — ABNORMAL LOW (ref 3.5–5.0)
Alkaline Phosphatase: 98 U/L (ref 38–126)
Anion gap: 9 (ref 5–15)
BUN: 21 mg/dL (ref 8–23)
CO2: 26 mmol/L (ref 22–32)
Calcium: 8.6 mg/dL — ABNORMAL LOW (ref 8.9–10.3)
Chloride: 99 mmol/L (ref 98–111)
Creatinine, Ser: 1.17 mg/dL (ref 0.61–1.24)
GFR, Estimated: >60 mL/min (ref >60)
Glucose, Bld: 139 mg/dL — ABNORMAL HIGH (ref 70–99)
Potassium: 4.4 mmol/L (ref 3.5–5.1)
Sodium: 134 mmol/L — ABNORMAL LOW (ref 135–145)
Total Bilirubin: 0.3 mg/dL (ref 0.3–1.2)
Total Protein: 6.7 g/dL (ref 6.5–8.1)

## 2021-09-29 LAB — LACTATE DEHYDROGENASE: LDH: 135 U/L (ref 98–192)

## 2021-09-29 LAB — FOLATE: Folate: 18.2 ng/mL (ref >5.9)

## 2021-09-29 LAB — VITAMIN B12: Vitamin B-12: 833 pg/mL (ref 180–914)

## 2021-09-29 MED ORDER — HEPARIN SOD (PORK) LOCK FLUSH 100 UNIT/ML IV SOLN
500.0000 [IU] | Freq: Once | INTRAVENOUS | Status: AC
  Administered 2021-09-29: 500 [IU] via INTRAVENOUS
  Filled 2021-09-29: qty 5

## 2021-09-29 MED ORDER — SODIUM CHLORIDE 0.9% FLUSH
10.0000 mL | Freq: Once | INTRAVENOUS | Status: AC
  Administered 2021-09-29: 10 mL via INTRAVENOUS
  Filled 2021-09-29: qty 10

## 2021-09-29 MED FILL — Dexamethasone Sodium Phosphate Inj 100 MG/10ML: INTRAMUSCULAR | Qty: 1 | Status: AC

## 2021-09-29 NOTE — Progress Notes (Signed)
? ? ? ?Hematology/Oncology Consult note ?[facility]  ?[phone] [phone] ? ?Patient Care Team: ?[facility] as PCP - General ?Kate Sable, MD as PCP - Cardiology (Cardiology) ?Telford Nab, RN as Sales executive ?Noreene Filbert, MD as Referring Physician (Radiation Oncology) ?Sindy Guadeloupe, MD as Consulting Physician (Oncology)  ? ?Name of the patient: Richard Wiggins  ?[id number]  ?Feb 08, 1951  ? ?Date of visit: 09/29/21 ? ?Diagnosis- history of stage I lung cancer s/p SBRT now with hilar recurrence ? ?Chief complaint/ Reason for visit-discuss further management of lung cancer ? ?Heme/Onc history: Patient is a 71 year old male who was a former smoker and quit in 2018.  He went to the ER in July 2021 for possible strokelike symptoms and was found to have a left upper lobe lung nodule which was followed up with a CT chest which showed a 1.2 x 1.1 cm left upper lobe spiculated nodule.  This was followed by a PET scan which showed that nodule was hypermetabolic with an SUV of 7.51 patient was also incidentally noted to have a 4.3 cm abdominal aortic aneurysm.  He follows up with Dr. Lucky Cowboy for his aneurysm.  He was seen by Dr. Patsey Berthold and underwent ENB.  Biopsy was nondiagnostic.  However given that the nodule was hypermetabolic it was concerning for malignancy and recommendation per Dr. Patsey Berthold was empiric radiation. Patient underwent SBRT to his left upper lobe by Dr. Baruch Gouty ?  ?Patient also has a positive fit test.  Colonoscopy in January 2022 showed no evidence of colon cancer.  There wereCouple of polyps which were resected and were consistent with tubular adenoma ?  ?Patient noted to have enlarging left hilar lymph node which was PET positiveIn January 2023.  He had EBUS guided bronchoscopy with Dr. Patsey Berthold which was consistent with non-small cell lung cancer ?  ?Due to cost concerns patient received 2 weeks of palliative radiation  treatment and has received 2 cycles of weekly CarboTaxol chemotherapy so far ?  ? ?Interval history-patient received 2 cycles of weekly CarboTaxol chemotherapy and last cycle was given about 3 weeks ago.  Radiation treatment was completed about 2 weeks ago.  Patient still continues to feel poorly.  Reports significant fatigue.  Occasionally feels lightheaded.  Unable to help around the house.  Spends most of his time resting.  He was recently in the hospital for nosebleed and at that time was found to have a hemoglobin of 6.9 and received a unit of blood transfusion. ? ?ECOG PS- 2-3 ?Pain scale- 0 ? ? ?Review of systems- Review of Systems  ?Constitutional:  Positive for malaise/fatigue. Negative for chills, fever and weight loss.  ?HENT:  Negative for congestion, ear discharge and nosebleeds.   ?Eyes:  Negative for blurred vision.  ?Respiratory:  Negative for cough, hemoptysis, sputum production, shortness of breath and wheezing.   ?Cardiovascular:  Negative for chest pain, palpitations, orthopnea and claudication.  ?Gastrointestinal:  Negative for abdominal pain, blood in stool, constipation, diarrhea, heartburn, melena, nausea and vomiting.  ?Genitourinary:  Negative for dysuria, flank pain, frequency, hematuria and urgency.  ?Musculoskeletal:  Negative for back pain, joint pain and myalgias.  ?Skin:  Negative for rash.  ?Neurological:  Negative for dizziness, tingling, focal weakness, seizures, weakness and headaches.  ?Endo/Heme/Allergies:  Does not bruise/bleed easily.  ?Psychiatric/Behavioral:  Negative for depression and suicidal ideas. The patient does not have insomnia.    ? ? ? ?Allergies  ?Allergen Reactions  ? Tape Rash  ?  plastic  ? Wound Dressing Adhesive Itching and Rash  ?  Gets stuck to skin, makes wound spread  ? ? ? ?Past Medical History:  ?Diagnosis Date  ? AAA (abdominal aortic aneurysm) 2020  ? a. 2020 U/S: 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 -plan medical managmment with tight bp control; b. 09/2020  Abd U/S: 4.1cm.  ? Anxiety   ? Aortic atherosclerosis ([place])   ? Benign essential HTN 02/10/2020  ? BPH (benign prostatic hyperplasia) 02/09/2020  ? Brain aneurysm   ? Cancer of upper lobe of left lung ([place]) 04/2020  ? a.) ENB/EBUS performed; Bx non-diagnostic. b.) presumed stage I NSCLC in the LUL; underwent SBRT (60 Gy over 5 fractions)  ? Carotid arterial disease ([place])   ? a. 05/2017 s/p R carotid endarterectomy following CVA.  ? 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 a. 2018 Echo: EF 30-35%; b. 02/2021 Echo: EF 55-60%, nl RV fxn. Triv MR. Asc Ao 52mm.  ? CKD (chronic kidney disease), stage III ([place])   ? Claudication [facility])   ? a. 09/2020 ABIs: R 0.91. L 0.99.  ? COPD (chronic obstructive pulmonary disease) ([place])   ? Coronary artery disease   ? a. 06/2017 PCI ([place]): s/p PCI to the RCA. LAD 50, LCX 20.  ? Depression   ? 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 a. 02/2021 Asc Ao 12mm.  ? Emphysema of lung ([place])   ? HLD (hyperlipidemia)   ? IDA (iron deficiency anemia) 02/09/2020  ? 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  ? Ischemic cardiomyopathy   ? Long term current use of anticoagulant   ? a.) apixaban  ? Marijuana use, continuous 02/10/2020  ? Osteoporosis   ? PAF (paroxysmal atrial fibrillation) ([place])   ? a. 05/2017 Dx in setting of CVA. CHA2DS2VASc = 6-->Eliquis.  ? Pre-diabetes   ? Pulmonary emboli ([place]) 06/07/2017  ? Right lower lobe pulmonary embolism small segmental, multifocal multifocal pneumonia, mediastinal lymphadenopathy, moderate centrilobular emphysema  ? Seizures ([place])   ? Sigmoid diverticulosis   ? a.) CT 01/27/2021: severe  ? Stroke [facility])   ? a. 05/2017 - hospitalized in [place]->prolonged hospitalization in setting of R CEA, PE, and finding of RCA dzs on cath; b. Residual left sided weakness to arm and leg.  ? ? ? ?Past Surgical History:  ?Procedure Laterality Date  ? brain aneurysm with clip    ? COLONOSCOPY WITH PROPOFOL N/A 07/23/2020  ? Procedure:  COLONOSCOPY WITH PROPOFOL;  Surgeon: Lin Landsman, MD;  Location: [facility] ENDOSCOPY;  Service: Gastroenterology;  Laterality: N/A;  ? CORONARY ANGIOPLASTY WITH STENT PLACEMENT  07/08/2017  ? PORTA CATH INSERTION N/A 08/31/2021  ? Procedure: PORTA CATH INSERTION;  Surgeon: Algernon Huxley, MD;  Location: [facility] CV LAB;  Service: Cardiovascular;  Laterality: N/A;  ? TOTAL HIP ARTHROPLASTY Right   ? VIDEO BRONCHOSCOPY WITH ENDOBRONCHIAL NAVIGATION N/A 04/23/2020  ? Procedure: VIDEO BRONCHOSCOPY WITH ENDOBRONCHIAL NAVIGATION;  Surgeon: Tyler Pita, MD;  Location: [facility] ORS;  Service: Pulmonary;  Laterality: N/A;  ? VIDEO BRONCHOSCOPY WITH ENDOBRONCHIAL ULTRASOUND Left 08/12/2021  ? Procedure: VIDEO BRONCHOSCOPY WITH ENDOBRONCHIAL ULTRASOUND;  Surgeon: Tyler Pita, MD;  Location: [facility] ORS;  Service: Cardiopulmonary;  Laterality: Left;  ? ? ?Social History  ? ?Socioeconomic History  ? Marital status: Married  ?  Spouse name: Butch Penny  ? Number of children: Not on file  ? Years of education: Not on file  ? Highest education level:  High school graduate  ?Occupational History  ? Occupation: Retired  ?Tobacco Use  ? Smoking status: Former  ?  Packs/day: 2.00  ?  Years: 53.00  ?  Pack years: 106.00  ?  Types: Cigarettes  ?  Quit date: 05/28/2017  ?  Years since quitting: 4.3  ? Smokeless tobacco: Never  ? Tobacco comments:  ?  Quit in 2018  ?Vaping Use  ? Vaping Use: Never used  ?Substance and Sexual Activity  ? Alcohol use: Yes  ?  Comment: socially drink cocktail  ? Drug use: Not Currently  ?  Types: Marijuana  ?  Comment: last smoke x1 month ago  ? Sexual activity: Not Currently  ?Other Topics Concern  ? Not on file  ?Social History Narrative  ? Lives with wife. 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 Children x2 children and grandchildren grown.   ? ?Social Determinants of Health  ? ?Financial Resource Strain: Low Risk   ? Difficulty of Paying Living Expenses: Not hard at all  ?Food Insecurity: No  Food Insecurity  ? Worried About Charity fundraiser in the Last Year: Never true  ? Ran Out of Food in the Last Year: Never true  ?Transportation Needs: No Transportation Needs  ? Lack of Transportation [employer]

## 2021-09-29 NOTE — Progress Notes (Signed)
Patient wife is concerned about having two more weeks of chemo. What will happen if they stop chemo.  ?

## 2021-09-30 ENCOUNTER — Encounter (INDEPENDENT_AMBULATORY_CARE_PROVIDER_SITE_OTHER): Payer: Self-pay | Admitting: Nurse Practitioner

## 2021-09-30 ENCOUNTER — Encounter: Payer: Self-pay | Admitting: Oncology

## 2021-09-30 ENCOUNTER — Other Ambulatory Visit: Payer: Self-pay | Admitting: *Deleted

## 2021-09-30 ENCOUNTER — Inpatient Hospital Stay: Payer: No Typology Code available for payment source

## 2021-09-30 ENCOUNTER — Ambulatory Visit (INDEPENDENT_AMBULATORY_CARE_PROVIDER_SITE_OTHER): Payer: Medicare Other | Admitting: Nurse Practitioner

## 2021-09-30 ENCOUNTER — Ambulatory Visit (INDEPENDENT_AMBULATORY_CARE_PROVIDER_SITE_OTHER): Payer: Medicare Other

## 2021-09-30 ENCOUNTER — Other Ambulatory Visit: Payer: Self-pay | Admitting: Pulmonary Disease

## 2021-09-30 ENCOUNTER — Ambulatory Visit: Payer: No Typology Code available for payment source

## 2021-09-30 VITALS — BP 170/90 | HR 69 | Resp 16 | Ht 70.0 in | Wt 191.6 lb

## 2021-09-30 DIAGNOSIS — I10 Essential (primary) hypertension: Secondary | ICD-10-CM | POA: Diagnosis not present

## 2021-09-30 DIAGNOSIS — I70213 Atherosclerosis of native arteries of extremities with intermittent claudication, bilateral legs: Secondary | ICD-10-CM | POA: Diagnosis not present

## 2021-09-30 DIAGNOSIS — I714 Abdominal aortic aneurysm, without rupture, unspecified: Secondary | ICD-10-CM | POA: Diagnosis not present

## 2021-09-30 DIAGNOSIS — I739 Peripheral vascular disease, unspecified: Secondary | ICD-10-CM | POA: Diagnosis not present

## 2021-09-30 LAB — HAPTOGLOBIN: Haptoglobin: 408 mg/dL — ABNORMAL HIGH (ref 34–355)

## 2021-09-30 LAB — KAPPA/LAMBDA LIGHT CHAINS
Kappa free light chain: 55.1 mg/L — ABNORMAL HIGH (ref 3.3–19.4)
Kappa, lambda light chain ratio: 1.31 (ref 0.26–1.65)
Lambda free light chains: 42.2 mg/L — ABNORMAL HIGH (ref 5.7–26.3)

## 2021-09-30 NOTE — Progress Notes (Signed)
Nutrition Follow-up: ? ?Patient with recurrent lung cancer.  Patient completed radiation and chemotherapy has been placed on hold due to anemia.  Noted recent hospital admission.  ? ?Spoke with patient and wife via phone.  Wife reports continued poor appetite.  Ate cheerios and salisbury steak yesterday.  Does not like ensure/boost shakes.  Diarrhea has improved.   ? ? ? ?Medications: reviewed ? ?Labs: reviewed ? ?Anthropometrics:  ? ?Weight 197 lb 4.8 3/21 (med onc??) ?191 lb today in MD's office ?191 lb on 3/8 ?196 lb on 1/17 ? ? ?NUTRITION DIAGNOSIS: Inadequate oral intake continues ? ? ?INTERVENTION:  ?Will mail smoothie/shake recipes high in calories and protein as patient does not like ensure/boost shakes.   ?  ? ?MONITORING, EVALUATION, GOAL: weight trends, intake ? ? ?NEXT VISIT: phone, Wed April 12 ? ?Wilena Tyndall B. Zenia Resides, RD, LDN ?Registered Dietitian ?[phone] ? ? ?

## 2021-10-01 ENCOUNTER — Ambulatory Visit (INDEPENDENT_AMBULATORY_CARE_PROVIDER_SITE_OTHER): Payer: No Typology Code available for payment source | Admitting: Pulmonary Disease

## 2021-10-01 ENCOUNTER — Encounter: Payer: Self-pay | Admitting: Oncology

## 2021-10-01 ENCOUNTER — Other Ambulatory Visit: Payer: Self-pay

## 2021-10-01 ENCOUNTER — Encounter: Payer: Self-pay | Admitting: Pulmonary Disease

## 2021-10-01 ENCOUNTER — Ambulatory Visit: Payer: No Typology Code available for payment source

## 2021-10-01 VITALS — BP 140/68 | HR 86 | Temp 98.2°F | Ht 70.0 in | Wt 190.8 lb

## 2021-10-01 DIAGNOSIS — C3402 Malignant neoplasm of left main bronchus: Secondary | ICD-10-CM | POA: Diagnosis not present

## 2021-10-01 DIAGNOSIS — R1013 Epigastric pain: Secondary | ICD-10-CM

## 2021-10-01 DIAGNOSIS — J449 Chronic obstructive pulmonary disease, unspecified: Secondary | ICD-10-CM | POA: Diagnosis not present

## 2021-10-01 DIAGNOSIS — K4090 Unilateral inguinal hernia, without obstruction or gangrene, not specified as recurrent: Secondary | ICD-10-CM

## 2021-10-01 MED ORDER — ESOMEPRAZOLE MAGNESIUM 40 MG PO PACK: 40.0000 mg | PACK | Freq: Every day | ORAL | 2 refills | Status: DC

## 2021-10-01 MED ORDER — TRAMADOL HCL 50 MG PO TABS: 50.0000 mg | ORAL_TABLET | Freq: Four times a day (QID) | ORAL | 0 refills | Status: DC | PRN

## 2021-10-01 NOTE — Patient Instructions (Signed)
We have sent a referral to the surgeon for the hernia. ? ?I have sent a medication to help him with his unsettled stomach.  Its Nexium he will take it in the morning before breakfast. ? ? ?We have called [employer]  see if they can get the nebulizer machine to use somehow. ? ?We will see him in follow-up in 2 months time call sooner should any new problems arise. ?

## 2021-10-01 NOTE — Progress Notes (Incomplete)
? ?Subjective:  ? ? Patient ID: Richard Wiggins, male    DOB: Sep 09, 1950, 71 y.o.   MRN: [id number] ?Patient Care Team: ?[facility] as PCP - General ?Kate Sable, MD as PCP - Cardiology (Cardiology) ?Telford Nab, RN as Sales executive ?Noreene Filbert, MD as Referring Physician (Radiation Oncology) ?Sindy Guadeloupe, MD as Consulting Physician (Oncology) ? ?Chief Complaint  ?Patient presents with  ?? Follow-up  ? ? ?HPI ? ?DATA: ?02/01/2020 2D echo: LVEF 60 to 65%, mild ventricular hypertrophy, left atrial size dilated no valvular abnormalities.  No wall motion abnormalities. ?02/26/2020 PFTs: FEV1 1.85 L or 56% predicted, FVC 3.19 L or 71% predicted, FEV1/FVC 58%, significant bronchodilator response.  There was hyperinflation and air trapping noted on lung volumes.  Diffusion capacity moderately to severely reduced.  Consistent with moderate COPD with chronic bronchitis and emphysema. ?09/29/2020 CT chest: Nodule posterior aspect of the left lower lobe 2.0 x 1.2 x 1.2 cm, right upper lobe pulmonary nodule 1.0 x 0.4 cm other scattered subcentimeter pulmonary nodules.  Bronchial wall thickening with mild centrilobular and paraseptal emphysema.  Significant coronary atherosclerosis noted. ?07/08/2021 CT chest: Continued interval increase size of a left hilar lymph node measuring 3.0 x 1.2 cm.  Post radiation changes left upper lobe. ?07/21/2021 PET/CT: Hypermetabolic left hilar lymph node 3.0 x 1.2 cm SUV max 12.0 no other hypermetabolic lymph nodes or suspicious pulmonary nodules. ? ?Review of Systems ?A 10 point review of systems was performed and it is as noted above otherwise negative. ? ?Patient Active Problem List  ? Diagnosis Date Noted  ?? Dehydration   ?? Epistaxis 09/23/2021  ?? Anemia 09/23/2021  ?? Lung cancer ([place]) 08/18/2021  ?? Renal artery stenosis ([place]) 08/11/2021  ?? CKD (chronic kidney disease), stage III ([place]) 03/11/2021  ?? 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 Iron deficiency anemia 02/04/2021  ?? Long term (current) use of anticoagulants 11/12/2020  ?? Positive colorectal cancer screening using Cologuard test 05/22/2020  ?? Goals of care, counseling/discussion 05/06/2020  ?? Malignant neoplasm of upper lobe of left lung ([place]) 05/06/2020  ?? Preventative health care 03/26/2020  ?? Atherosclerosis of native arteries of extremity with intermittent claudication ([place]) 03/14/2020  ?? Trigger middle finger of right hand 02/20/2020  ?? Abdominal aortic aneurysm (AAA) without rupture 02/20/2020  ?? 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  ?? Benign essential HTN 02/10/2020  ?? Pre-diabetes 02/10/2020  ?? BPH (benign prostatic hyperplasia) 02/09/2020  ?? Pancytopenia ([place]) 02/09/2020  ?? Weakness of lower extremity 02/01/2020  ?? COPD (chronic obstructive pulmonary disease) ([place]) 02/01/2020  ?? Chronic systolic CHF (congestive heart failure) ([place]) 02/01/2020  ?? Depression 02/01/2020  ?? A-fib ([place]) 02/01/2020  ?? CVA (cerebral vascular accident) ([place]) 02/01/2020  ?? Coronary artery disease 02/01/2020  ?? Carotid artery stenosis 02/01/2020  ?? HLD (hyperlipidemia) 02/01/2020  ?? Carotid stenosis, asymptomatic, left 02/14/2018  ?? S/P coronary artery stent placement 09/20/2017  ?? Centrilobular emphysema ([place]) 06/10/2017  ?? Chronic back pain 06/10/2017  ?? DOE (dyspnea on exertion) 06/10/2017  ?? Elevated TSH 06/10/2017  ?? Hematoma of groin 06/10/2017  ?? PAD (peripheral artery disease) ([place]) 06/10/2017  ?? Pulmonary embolism ([place]) 06/10/2017  ?? Tobacco abuse 06/10/2017  ?? Unintended weight loss 06/10/2017  ?? AAA (abdominal aortic aneurysm) 06/10/2017  ?? PAF (paroxysmal atrial fibrillation) ([place]) 06/10/2017  ?? Systolic CHF, chronic ([place]) 06/10/2017  ?? Hypertension 05/31/2017  ?? Other hyperlipidemia 05/31/2017  ?? Hemiparesis affecting left side as late effect of cerebrovascular accident (  [place]) 05/29/2017  ?? Carotid artery stenosis, symptomatic,  right 05/29/2017  ?? Embolic stroke involving right middle cerebral artery ([place]) 05/28/2017  ? ?Social History  ? ?Tobacco Use  ?? Smoking status: Former  ?  Packs/day: 2.00  ?  Years: 53.00  ?  Pack years: 106.00  ?  Types: Cigarettes  ?  Quit date: 05/28/2017  ?  Years since quitting: 4.3  ?? Smokeless tobacco: Never  ?? Tobacco comments:  ?  Quit in 2018  ?Substance Use Topics  ?? Alcohol use: Yes  ?  Comment: socially drink cocktail  ? ?Allergies  ?Allergen Reactions  ?? Tape Rash  ?  plastic  ?? Wound Dressing Adhesive Itching and Rash  ?  Gets stuck to skin, makes wound spread  ? ?Current Meds  ?Medication Sig  ?? albuterol (PROVENTIL) (2.5 MG/3ML) 0.083% nebulizer solution Take 3 mLs (2.5 mg total) by nebulization every 6 (six) hours as needed for wheezing or shortness of breath.  ?? albuterol (VENTOLIN HFA) 108 (90 Base) MCG/ACT inhaler INHALE 2 PUFFS INTO THE LUNGS EVERY 6 HOURS AS NEEDED FOR WHEEZING OR SHORTNESS OF BREATH  ?? apixaban (ELIQUIS) 5 MG TABS tablet TAKE 1 TABLET(5 MG) BY MOUTH IN THE MORNING AND AT BEDTIME  ?? aspirin 81 MG chewable tablet CHEW ONE TABLET BY MOUTH DAILY FOR HEART  ?? atorvastatin (LIPITOR) 40 MG tablet TAKE 1 TABLET(40 MG) BY MOUTH AT BEDTIME  ?? baclofen (LIORESAL) 10 MG tablet Take 1 tablet (10 mg total) by mouth 3 (three) times daily as needed for muscle spasms.  ?? Budeson-Glycopyrrol-Formoterol (BREZTRI AEROSPHERE) 160-9-4.8 MCG/ACT AERO Inhale 2 puffs into the lungs in the morning and at bedtime.  ?? carvedilol (COREG) 25 MG tablet Take 0.5 tablets (12.5 mg total) by mouth in the morning and at bedtime.  ?? dexamethasone (DECADRON) 4 MG tablet Take 2 tablets (8 mg total) by mouth daily. Start the day after chemotherapy for 2 days.  ?? furosemide (LASIX) 40 MG tablet Take 1 tablet (40 mg total) by mouth as needed.  ?? lamoTRIgine (LAMICTAL) 100 MG tablet Take 100 mg by mouth 2 (two) times daily.  ?? lidocaine-prilocaine (EMLA) cream Apply to affected area once  ??  LORazepam (ATIVAN) 0.5 MG tablet Take 1 tablet (0.5 mg total) by mouth every 6 (six) hours as needed (Nausea or vomiting).  ?? Multiple Vitamin (MULTI-VITAMIN) tablet Take 1 tablet by mouth daily.  ?? ondansetron (ZOFRAN) 8 MG tablet Take 1 tablet (8 mg total) by mouth 2 (two) times daily as needed for refractory nausea / vomiting. Start on day 3 after chemo.  ?? prochlorperazine (COMPAZINE) 10 MG tablet Take 1 tablet (10 mg total) by mouth every 6 (six) hours as needed (Nausea or vomiting).  ?? sacubitril-valsartan (ENTRESTO) 49-51 MG Take 0.5 tablets by mouth in the morning and at bedtime.  ?? sucralfate (CARAFATE) 1 g tablet Take 1 tablet (1 g total) by mouth 3 (three) times daily. Dissolve in 3-4 tbsp warm water, swish and swallow.  ?? tamsulosin (FLOMAX) 0.4 MG CAPS capsule Take 1 capsule (0.4 mg total) by mouth daily.  ?? traMADol (ULTRAM) 50 MG tablet Take 1 tablet (50 mg total) by mouth every 6 (six) hours as needed.  ? ?Immunization History  ?Administered Date(s) Administered  ?? Fluad Quad(high Dose 65+) 03/26/2020  ?? Influenza,inj,Quad PF,6+ Mos 06/02/2017  ?? Influenza-Unspecified 07/12/1997, 04/23/2019, 04/11/2021  ?? Moderna Sars-Covid-2 Vaccination 09/12/2019, 10/10/2019, 04/20/2020, 10/09/2020  ?? Pneumococcal Polysaccharide-23 06/02/2017  ?? Tdap 02/09/2018  ??  Zoster Recombinat (Shingrix) 05/21/2019, 05/31/2019, 10/19/2019  ? ? ?   ?Objective:  ? Physical Exam ?BP 140/68 (BP Location: Left Arm, Patient Position: Sitting, Cuff Size: Normal)   Pulse 86   Temp 98.2 ?F (36.8 ?C) (Oral)   Ht 5\' 10"  (1.778 m)   Wt 190 lb 12.8 oz (86.5 kg)   SpO2 97%   BMI 27.38 kg/m?  ?GENERAL: Awake, alert, fully ambulatory.  Flat affect.  No conversational dyspnea.  Mild tachypnea, no distress. ?HEAD: Normocephalic, atraumatic. ?EYES: Pupils equal, round, reactive to light.  No scleral icterus. ?MOUTH: Nose/mouth/throat not examined due to masking requirements for COVID 19. ?NECK: Supple. No thyromegaly. Trachea  midline. No JVD.  No adenopathy. ?PULMONARY: Mild tachypnea.  Good air entry bilaterally.  Coarse breath sounds, no other adventitious sounds.  ?CARDIOVASCULAR: S1 and S2. Regular rate and rhythm.  No rubs, murmurs o

## 2021-10-02 ENCOUNTER — Ambulatory Visit: Payer: No Typology Code available for payment source

## 2021-10-02 ENCOUNTER — Other Ambulatory Visit: Payer: No Typology Code available for payment source

## 2021-10-02 LAB — MULTIPLE MYELOMA PANEL, SERUM
Albumin SerPl Elph-Mcnc: 2.3 g/dL — ABNORMAL LOW (ref 2.9–4.4)
Albumin/Glob SerPl: 0.7 (ref 0.7–1.7)
Alpha 1: 0.5 g/dL — ABNORMAL HIGH (ref 0.0–0.4)
Alpha2 Glob SerPl Elph-Mcnc: 1 g/dL (ref 0.4–1.0)
B-Globulin SerPl Elph-Mcnc: 0.8 g/dL (ref 0.7–1.3)
Gamma Glob SerPl Elph-Mcnc: 1.1 g/dL (ref 0.4–1.8)
Globulin, Total: 3.5 g/dL (ref 2.2–3.9)
IgA: 129 mg/dL (ref 61–437)
IgG (Immunoglobin G), Serum: 1097 mg/dL (ref 603–1613)
IgM (Immunoglobulin M), Srm: 146 mg/dL — ABNORMAL HIGH (ref 15–143)
Total Protein ELP: 5.8 g/dL — ABNORMAL LOW (ref 6.0–8.5)

## 2021-10-05 ENCOUNTER — Encounter (INDEPENDENT_AMBULATORY_CARE_PROVIDER_SITE_OTHER): Payer: Self-pay | Admitting: Nurse Practitioner

## 2021-10-05 ENCOUNTER — Ambulatory Visit: Payer: No Typology Code available for payment source

## 2021-10-05 NOTE — Progress Notes (Signed)
? ?Subjective:  ? ? Patient ID: Richard Wiggins, male    DOB: 1950/10/15, 71 y.o.   MRN: [id number] ?Chief Complaint  ?Patient presents with  ? Follow-up  ?  Ultrasound follow up  ? ? ?Richard Wiggins is a 71 year old male that returns today for follow-up of multiple vascular issues.  Unfortunately since the patient's most recent office visit he was found to have lung cancer and recently had chemotherapy.  The chemotherapy made him severely sick with anemia and recently it.  Due to this he has an upcoming PET scan as well as discussion about the next steps in treatment.  In addition to this he has recently developed an inguinal hernia which was very painful for him and he can barely walk at this time.  The patient does have a known aortic aneurysm as well as iliac level disease.  Initially he had worsening claudication-like symptoms however recently has not been very active and so the claudication symptoms have not been as significant for him. ? ?Today he has an ABI of 1.01 bilaterally with a TBI of 0.61 on the right and 0.59 on the left.  Triphasic tibial artery waveforms with normal toe waveforms bilaterally. ? ?Currently his aneurysm measures 3.9 cm bilaterally.  There is some mild dilatation of the right proximal common iliac artery.  There is also some noted stenosis of the left common iliac artery noted by velocities. ? ? ? ? ?Review of Systems  ?Constitutional:  Positive for fatigue.  ?Musculoskeletal:  Positive for gait problem.  ?All other systems reviewed and are negative. ? ?   ?Objective:  ? Physical Exam ?Vitals reviewed.  ?HENT:  ?   Head: Normocephalic.  ?Cardiovascular:  ?   Rate and Rhythm: Normal rate.  ?   Pulses:     ?     Dorsalis pedis pulses are detected w/ Doppler on the right side and detected w/ Doppler on the left side.  ?     Posterior tibial pulses are detected w/ Doppler on the right side and detected w/ Doppler on the left side.  ?Pulmonary:  ?   Effort: Pulmonary effort is normal.   ?Skin: ?   General: Skin is warm and dry.  ?Neurological:  ?   Mental Status: He is alert and oriented to person, place, and time.  ?   Gait: Gait abnormal.  ?Psychiatric:     ?   Mood and Affect: Mood normal.     ?   Behavior: Behavior normal.     ?   Thought Content: Thought content normal.     ?   Judgment: Judgment normal.  ? ? ?BP (!) 170/90 (BP Location: Left Arm)   Pulse 69   Resp 16   Ht 5\' 10"  (1.778 m)   Wt 191 lb 9.6 oz (86.9 kg)   BMI 27.49 kg/m?  ? ?Past Medical History:  ?Diagnosis Date  ? AAA (abdominal aortic aneurysm) 2020  ? a. 2020 U/S: 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 -plan medical managmment with tight bp control; b. 09/2020 Abd U/S: 4.1cm.  ? Anxiety   ? Aortic atherosclerosis ([place])   ? Benign essential HTN 02/10/2020  ? BPH (benign prostatic hyperplasia) 02/09/2020  ? Brain aneurysm   ? Cancer of upper lobe of left lung ([place]) 04/2020  ? a.) ENB/EBUS performed; Bx non-diagnostic. b.) presumed stage I NSCLC in the LUL; underwent SBRT (60 Gy over 5 fractions)  ? Carotid arterial disease ([place])   ? a. 05/2017  s/p R carotid endarterectomy following CVA.  ? 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 a. 2018 Echo: EF 30-35%; b. 02/2021 Echo: EF 55-60%, nl RV fxn. Triv MR. Asc Ao 22mm.  ? CKD (chronic kidney disease), stage III ([place])   ? Claudication [facility])   ? a. 09/2020 ABIs: R 0.91. L 0.99.  ? COPD (chronic obstructive pulmonary disease) ([place])   ? Coronary artery disease   ? a. 06/2017 PCI ([place]): s/p PCI to the RCA. LAD 50, LCX 20.  ? Depression   ? 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 a. 02/2021 Asc Ao 23mm.  ? Emphysema of lung ([place])   ? HLD (hyperlipidemia)   ? IDA (iron deficiency anemia) 02/09/2020  ? 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  ? Ischemic cardiomyopathy   ? Long term current use of anticoagulant   ? a.) apixaban  ? Marijuana use, continuous 02/10/2020  ? Osteoporosis   ? PAF (paroxysmal atrial fibrillation) ([place])   ? a. 05/2017 Dx in  setting of CVA. CHA2DS2VASc = 6-->Eliquis.  ? Pre-diabetes   ? Pulmonary emboli ([place]) 06/07/2017  ? Right lower lobe pulmonary embolism small segmental, multifocal multifocal pneumonia, mediastinal lymphadenopathy, moderate centrilobular emphysema  ? Seizures ([place])   ? Sigmoid diverticulosis   ? a.) CT 01/27/2021: severe  ? Stroke [facility])   ? a. 05/2017 - [place]->prolonged hospitalization in setting of R CEA, PE, and finding of RCA dzs on cath; b. Residual left sided weakness to arm and leg.  ? ? ?Social History  ? ?Socioeconomic History  ? Marital status: Married  ?  Spouse name: Butch Penny  ? Number of children: Not on file  ? Years of education: Not on file  ? Highest education level: High school graduate  ?Occupational History  ? Occupation: Retired  ?Tobacco Use  ? Smoking status: Former  ?  Packs/day: 2.00  ?  Years: 53.00  ?  Pack years: 106.00  ?  Types: Cigarettes  ?  Quit date: 05/28/2017  ?  Years since quitting: 4.3  ? Smokeless tobacco: Never  ? Tobacco comments:  ?  Quit in 2018  ?Vaping Use  ? Vaping Use: Never used  ?Substance and Sexual Activity  ? Alcohol use: Yes  ?  Comment: socially drink cocktail  ? Drug use: Not Currently  ?  Types: Marijuana  ?  Comment: last smoke x1 month ago  ? Sexual activity: Not Currently  ?Other Topics Concern  ? Not on file  ?Social History Narrative  ? Lives with wife. 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 Children x2 children and grandchildren grown.   ? ?Social Determinants of Health  ? ?Financial Resource Strain: Low Risk   ? Difficulty of Paying Living Expenses: Not hard at all  ?Food Insecurity: No Food Insecurity  ? Worried About Charity fundraiser in the Last Year: Never true  ? Ran Out of Food in the Last Year: Never true  ?Transportation Needs: No Transportation Needs  ? Lack of Transportation (Medical): No  ? Lack of Transportation (Non-Medical): No  ?Physical Activity: Inactive  ? Days of Exercise per Week: 0 days  ? Minutes of Exercise per  Session: 0 min  ?Stress: Stress Concern Present  ? Feeling of Stress : Rather much  ?Social Connections: Moderately Isolated  ? Frequency of Communication with Friends and Family: Never  ? Frequency of Social Gatherings with Friends and Family: Never  ?  Attends Religious Services: Never  ? Active Member of Clubs or Organizations: Yes  ? Attends Archivist Meetings: Never  ? Marital Status: Married  ?Intimate Partner Violence: Not At Risk  ? Fear of Current or Ex-Partner: No  ? Emotionally Abused: No  ? Physically Abused: No  ? Sexually Abused: No  ? ? ?Past Surgical History:  ?Procedure Laterality Date  ? brain aneurysm with clip    ? COLONOSCOPY WITH PROPOFOL N/A 07/23/2020  ? Procedure: COLONOSCOPY WITH PROPOFOL;  Surgeon: Lin Landsman, MD;  [facility] ENDOSCOPY;  Service: Gastroenterology;  Laterality: N/A;  ? CORONARY ANGIOPLASTY WITH STENT PLACEMENT  07/08/2017  ? PORTA CATH INSERTION N/A 08/31/2021  ? Procedure: PORTA CATH INSERTION;  Surgeon: Algernon Huxley, MD;  Location: [place] CV LAB;  Service: Cardiovascular;  Laterality: N/A;  ? TOTAL HIP ARTHROPLASTY Right   ? VIDEO BRONCHOSCOPY WITH ENDOBRONCHIAL NAVIGATION N/A 04/23/2020  ? Procedure: VIDEO BRONCHOSCOPY WITH ENDOBRONCHIAL NAVIGATION;  Surgeon: Tyler Pita, MD;  Location: [facility] ORS;  Service: Pulmonary;  Laterality: N/A;  ? VIDEO BRONCHOSCOPY WITH ENDOBRONCHIAL ULTRASOUND Left 08/12/2021  ? Procedure: VIDEO BRONCHOSCOPY WITH ENDOBRONCHIAL ULTRASOUND;  Surgeon: Tyler Pita, MD;  Location: [facility] ORS;  Service: Cardiopulmonary;  Laterality: Left;  ? ? ?Family History  ?Problem Relation Age of Onset  ? Diabetes Mother   ? Heart disease Mother   ? Stroke Father   ? Heart disease Father   ? Heart attack Father   ? Alcohol abuse Father   ? Heart disease Sister   ? Heart attack Sister   ? Heart disease Maternal Grandmother   ? Heart attack Maternal Grandmother   ? Heart attack Paternal Grandmother   ? Diabetes Brother   ?  Heart disease Brother   ? Heart attack Brother   ? ? ?Allergies  ?Allergen Reactions  ? Tape Rash  ?  plastic  ? Wound Dressing Adhesive Itching and Rash  ?  Gets stuck to skin, makes wound spread  ? ? ? ?  Late

## 2021-10-06 ENCOUNTER — Encounter: Payer: Self-pay | Admitting: Pulmonary Disease

## 2021-10-06 ENCOUNTER — Telehealth: Payer: Self-pay | Admitting: *Deleted

## 2021-10-06 ENCOUNTER — Encounter: Payer: Self-pay | Admitting: Oncology

## 2021-10-06 ENCOUNTER — Ambulatory Visit: Payer: No Typology Code available for payment source

## 2021-10-06 NOTE — Telephone Encounter (Signed)
I called about 15 min ago and left message on wife cell phone and I also responded through my chart about what amount of blood and if it is more blood than sputum them he needs to be seen. Asked them to send message back to see if he needs appts ?

## 2021-10-06 NOTE — Telephone Encounter (Addendum)
Call from occupational therapy reporting that patient has coughed up blood for the second time in 3 weeks Also reports that back pain is now at 3/10 using [place] regularly. Please advise re hemoptysis. She requests we call patient back and not her ?

## 2021-10-06 NOTE — Telephone Encounter (Signed)
Are you seeing him soon? Scant hemoptysis is not uncommon after radiation. He has scans next week. Need to see how his cbc looks and how severe his hemoptysis is at visit

## 2021-10-06 NOTE — Telephone Encounter (Signed)
I will reach back out and let you know what was decided. Thank you! ?

## 2021-10-06 NOTE — Telephone Encounter (Signed)
Patient's occupational therapist, Sherlynn Stalls [phone]), called to explain the same thing and would like [place] to call the patient to discuss. ? ?Please advise. ? ? ?

## 2021-10-06 NOTE — Telephone Encounter (Signed)
If the pain in the groin is that severe he really needs to come to the emergency room.  Sometimes times bowel can get trapped in the hernia.  They will also have to scan him to check about the blood in the sputum. ?

## 2021-10-06 NOTE — Telephone Encounter (Signed)
Dr Patsey Berthold please advise:  ? ? ?This is the second time in 3 weeks that there has been blood in the sputum.  Merry Proud is experiencing a lot of pain in his lower back and of course in his groin.  He says the  pain level is at a 9 today.  I gave him a tramadol with the zofron at the same time around 9:30 am.  He says his groin is about an 8 now while his back is about a 9.  Any suggestions? ?

## 2021-10-06 NOTE — Telephone Encounter (Signed)
Called and LVM for wie in regards to Dr Domingo Dimes recommendation. Will try again later.  ?

## 2021-10-06 NOTE — Telephone Encounter (Signed)
Reached out to pt and no answer. LVM for pt to return call to discuss symptoms and pain.  ?

## 2021-10-06 NOTE — Telephone Encounter (Signed)
Called and spoke to wife. She had a clear understanding. Nothing further needed.  ?

## 2021-10-06 NOTE — Telephone Encounter (Signed)
Called and left message about how much blood that he is coughing up. Sometimes radiation can have some blood in sputum. But is more bright red blood and just little of sputum then he needs to be seen. Please send a my chart message and we can get him an appt ?

## 2021-10-07 ENCOUNTER — Inpatient Hospital Stay: Payer: No Typology Code available for payment source

## 2021-10-07 ENCOUNTER — Ambulatory Visit
Admission: RE | Admit: 2021-10-07 | Discharge: 2021-10-07 | Disposition: A | Discharge status: Home / Self Care | Payer: No Typology Code available for payment source | Source: Ambulatory Visit | Attending: Radiation Oncology | Admitting: Radiation Oncology

## 2021-10-07 ENCOUNTER — Encounter: Payer: Self-pay | Admitting: Oncology

## 2021-10-07 ENCOUNTER — Other Ambulatory Visit: Payer: Self-pay

## 2021-10-07 ENCOUNTER — Encounter: Payer: Self-pay | Admitting: Radiation Oncology

## 2021-10-07 ENCOUNTER — Other Ambulatory Visit: Payer: Self-pay | Admitting: *Deleted

## 2021-10-07 ENCOUNTER — Encounter: Payer: Self-pay | Admitting: Hospice and Palliative Medicine

## 2021-10-07 ENCOUNTER — Ambulatory Visit: Payer: No Typology Code available for payment source

## 2021-10-07 ENCOUNTER — Inpatient Hospital Stay (HOSPITAL_BASED_OUTPATIENT_CLINIC_OR_DEPARTMENT_OTHER): Payer: No Typology Code available for payment source | Admitting: Hospice and Palliative Medicine

## 2021-10-07 ENCOUNTER — Ambulatory Visit: Payer: No Typology Code available for payment source | Admitting: Oncology

## 2021-10-07 VITALS — BP 116/62 | HR 88 | Temp 99.7°F | Resp 16

## 2021-10-07 VITALS — BP 113/68 | HR 96 | Temp 99.4°F | Ht 70.0 in | Wt 184.5 lb

## 2021-10-07 DIAGNOSIS — R042 Hemoptysis: Secondary | ICD-10-CM

## 2021-10-07 DIAGNOSIS — Z923 Personal history of irradiation: Secondary | ICD-10-CM | POA: Insufficient documentation

## 2021-10-07 DIAGNOSIS — C3412 Malignant neoplasm of upper lobe, left bronchus or lung: Secondary | ICD-10-CM | POA: Insufficient documentation

## 2021-10-07 DIAGNOSIS — D649 Anemia, unspecified: Secondary | ICD-10-CM

## 2021-10-07 DIAGNOSIS — C3490 Malignant neoplasm of unspecified part of unspecified bronchus or lung: Secondary | ICD-10-CM

## 2021-10-07 DIAGNOSIS — Z7901 Long term (current) use of anticoagulants: Secondary | ICD-10-CM | POA: Diagnosis not present

## 2021-10-07 DIAGNOSIS — Z9221 Personal history of antineoplastic chemotherapy: Secondary | ICD-10-CM | POA: Diagnosis not present

## 2021-10-07 DIAGNOSIS — Z95828 Presence of other vascular implants and grafts: Secondary | ICD-10-CM

## 2021-10-07 LAB — CBC WITH DIFFERENTIAL/PLATELET
Abs Immature Granulocytes: 0.03 10*3/uL (ref 0.00–0.07)
Basophils Absolute: 0 10*3/uL (ref 0.0–0.1)
Basophils Relative: 0 %
Eosinophils Absolute: 0 10*3/uL (ref 0.0–0.5)
Eosinophils Relative: 1 %
HCT: 22.1 % — ABNORMAL LOW (ref 39.0–52.0)
Hemoglobin: 6.9 g/dL — ABNORMAL LOW (ref 13.0–17.0)
Immature Granulocytes: 1 %
Lymphocytes Relative: 15 %
Lymphs Abs: 0.3 10*3/uL — ABNORMAL LOW (ref 0.7–4.0)
MCH: 28.3 pg (ref 26.0–34.0)
MCHC: 31.2 g/dL (ref 30.0–36.0)
MCV: 90.6 fL (ref 80.0–100.0)
Monocytes Absolute: 0.3 10*3/uL (ref 0.1–1.0)
Monocytes Relative: 15 %
Neutro Abs: 1.6 10*3/uL — ABNORMAL LOW (ref 1.7–7.7)
Neutrophils Relative %: 68 %
Platelets: 348 10*3/uL (ref 150–400)
RBC: 2.44 MIL/uL — ABNORMAL LOW (ref 4.22–5.81)
RDW: 15.9 % — ABNORMAL HIGH (ref 11.5–15.5)
WBC: 2.3 10*3/uL — ABNORMAL LOW (ref 4.0–10.5)
nRBC: 0 % (ref 0.0–0.2)

## 2021-10-07 LAB — SAMPLE TO BLOOD BANK

## 2021-10-07 LAB — PREPARE RBC (CROSSMATCH)

## 2021-10-07 MED ORDER — HEPARIN SOD (PORK) LOCK FLUSH 100 UNIT/ML IV SOLN
500.0000 [IU] | Freq: Once | INTRAVENOUS | Status: AC
  Administered 2021-10-07: 500 [IU] via INTRAVENOUS
  Filled 2021-10-07: qty 5

## 2021-10-07 MED ORDER — SODIUM CHLORIDE 0.9% FLUSH
10.0000 mL | Freq: Once | INTRAVENOUS | Status: AC
  Administered 2021-10-07: 10 mL via INTRAVENOUS
  Filled 2021-10-07: qty 10

## 2021-10-07 NOTE — Addendum Note (Signed)
Addended by: Irean Hong on: 10/07/2021 05:23 PM ? ? Modules accepted: Orders ? ?

## 2021-10-07 NOTE — Progress Notes (Addendum)
? ?Sympt[facility] Management [facility] ?[place] at [facility] ?[phone] [phone] ? ?Patient Care Team: ?Clinic, [facility] as PCP - General ?Kate Sable, MD as PCP - Cardiology (Cardiology) ?Telford Nab, RN as Sales executive ?Noreene Filbert, MD as Referring Physician (Radiation Oncology) ?Sindy Guadeloupe, MD as Consulting Physician (Oncology)  ? ?Name of the patient: Richard Wiggins  ?[id number]  ?01-03-1951  ? ?Date of visit: 10/07/21 ? ?Reason for Consult: ?Richard Wiggins is a 71 y.o. male with multiple medical problems including stage I lung cancer status post SBRT now with hilar recurrence on CarboTaxol chemotherapy.  ? ?Patient received cycle 1 carbo Taxol chemotherapy on 09/02/2021 and then return for cycle 2 of on 09/09/2021 and cycle 3 on 10/01/2018. ? ?Patient was recently hospitalized 09/23/2021-09/24/2021 with symptomatic anemia secondary to epistaxis.  He received transfusion.  Eliquis was temporarily held but then restarted after resolution of epistaxis. ? ?Patient presents to [facility] today for evaluation of hemoptysis.  Patient reports an episode of dime sized hemoptysis about 3 weeks ago.  Yesterday, patient had another episode of about nickel sized hemoptysis.  He has not had any since.  No other bleeding reported.  Patient remains fatigued and weak but is working with PT OT at home.  He denies other symptomatic complaints. ? ?Denies any neurologic complaints. Denies recent fevers or illnesses. Reports fair appetite and denies weight loss. Denies chest pain. Denies any nausea, vomiting.  Has constipation constipation. Denies urinary complaints. Patient offers no further specific complaints today. ? ?PAST MEDICAL HISTORY: ?Past Medical History:  ?Diagnosis Date  ? AAA (abdominal aortic aneurysm) 2020  ? a. 2020 U/S: 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 -plan medical managmment with tight bp control; b. 09/2020 Abd U/S: 4.1cm.  ? Anxiety   ? Aortic  atherosclerosis ([place])   ? Benign essential HTN 02/10/2020  ? BPH (benign prostatic hyperplasia) 02/09/2020  ? Brain aneurysm   ? Cancer of upper lobe of left lung ([place]) 04/2020  ? a.) ENB/EBUS performed; Bx non-diagnostic. b.) presumed stage I NSCLC in the LUL; underwent SBRT (60 Gy over 5 fractions)  ? Carotid arterial disease ([place])   ? a. 05/2017 s/p R carotid endarterectomy following CVA.  ? 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 a. 2018 Echo: EF 30-35%; b. 02/2021 Echo: EF 55-60%, nl RV fxn. Triv MR. Asc Ao 48mm.  ? CKD (chronic kidney disease), stage III ([place])   ? Claudication [facility])   ? a. 09/2020 ABIs: R 0.91. L 0.99.  ? COPD (chronic obstructive pulmonary disease) ([place])   ? Coronary artery disease   ? a. 06/2017 PCI ([place]): s/p PCI to the RCA. LAD 50, LCX 20.  ? Depression   ? 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 a. 02/2021 Asc Ao 82mm.  ? Emphysema of lung ([place])   ? HLD (hyperlipidemia)   ? IDA (iron deficiency anemia) 02/09/2020  ? 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  ? Ischemic cardiomyopathy   ? Long term current use of anticoagulant   ? a.) apixaban  ? Marijuana use, continuous 02/10/2020  ? Osteoporosis   ? PAF (paroxysmal atrial fibrillation) ([place])   ? a. 05/2017 Dx in setting of CVA. CHA2DS2VASc = 6-->Eliquis.  ? Pre-diabetes   ? Pulmonary emboli ([place]) 06/07/2017  ? Right lower lobe pulmonary embolism small segmental, multifocal multifocal pneumonia, mediastinal lymphadenopathy, moderate centrilobular emphysema  ? Seizures ([place])   ? Sigmoid diverticulosis   ?  a.) CT 01/27/2021: severe  ? Stroke [facility])   ? a. 05/2017 - [place]->prolonged hospitalization in setting of R CEA, PE, and finding of RCA dzs on cath; b. Residual left sided weakness to arm and leg.  ? ? ?PAST SURGICAL HISTORY:  ?Past Surgical History:  ?Procedure Laterality Date  ? brain aneurysm with clip    ? COLONOSCOPY WITH PROPOFOL N/A 07/23/2020  ? Procedure: COLONOSCOPY WITH  PROPOFOL;  Surgeon: Lin Landsman, MD;  Location: [facility] ENDOSCOPY;  Service: Gastroenterology;  Laterality: N/A;  ? CORONARY ANGIOPLASTY WITH STENT PLACEMENT  07/08/2017  ? PORTA CATH INSERTION N/A 08/31/2021  ? Procedure: PORTA CATH INSERTION;  Surgeon: Algernon Huxley, MD;  Location: [place] CV LAB;  Service: Cardiovascular;  Laterality: N/A;  ? TOTAL HIP ARTHROPLASTY Right   ? VIDEO BRONCHOSCOPY WITH ENDOBRONCHIAL NAVIGATION N/A 04/23/2020  ? Procedure: VIDEO BRONCHOSCOPY WITH ENDOBRONCHIAL NAVIGATION;  Surgeon: Tyler Pita, MD;  Location: [facility] ORS;  Service: Pulmonary;  Laterality: N/A;  ? VIDEO BRONCHOSCOPY WITH ENDOBRONCHIAL ULTRASOUND Left 08/12/2021  ? Procedure: VIDEO BRONCHOSCOPY WITH ENDOBRONCHIAL ULTRASOUND;  Surgeon: Tyler Pita, MD;  Location: [facility] ORS;  Service: Cardiopulmonary;  Laterality: Left;  ? ? ?HEMATOLOGY/ONCOLOGY HISTORY:  ?Oncology History  ?Lung cancer ([place])  ?08/18/2021 Initial Diagnosis  ? Recurrent non-small cell lung cancer ([place]) ?  ?08/18/2021 Cancer Staging  ? Staging form: Lung, AJCC 8th Edition ?- Clinical stage from 08/18/2021: Stage Unknown (cTX, cN1, cM0) - Signed by Sindy Guadeloupe, MD on 08/18/2021 ?  ?09/02/2021 -  Chemotherapy  ? Patient is on Treatment Plan : LUNG Carboplatin / Paclitaxel + XRT q7d  ?   ? ? ?ALLERGIES:  is allergic to tape and wound dressing adhesive. ? ?MEDICATIONS:  ?Current Outpatient Medications  ?Medication Sig Dispense Refill  ? albuterol (PROVENTIL) (2.5 MG/3ML) 0.083% nebulizer solution Take 3 mLs (2.5 mg total) by nebulization every 6 (six) hours as needed for wheezing or shortness of breath. 75 mL 6  ? albuterol (VENTOLIN HFA) 108 (90 Base) MCG/ACT inhaler INHALE 2 PUFFS INTO THE LUNGS EVERY 6 HOURS AS NEEDED FOR WHEEZING OR SHORTNESS OF BREATH 18 g 2  ? apixaban (ELIQUIS) 5 MG TABS tablet TAKE 1 TABLET(5 MG) BY MOUTH IN THE MORNING AND AT BEDTIME 60 tablet 3  ? aspirin 81 MG chewable tablet CHEW ONE TABLET BY MOUTH DAILY FOR HEART    ?  atorvastatin (LIPITOR) 40 MG tablet TAKE 1 TABLET(40 MG) BY MOUTH AT BEDTIME 90 tablet 3  ? baclofen (LIORESAL) 10 MG tablet Take 1 tablet (10 mg total) by mouth 3 (three) times daily as needed for muscle spasms. 30 each 1  ? Budeson-Glycopyrrol-Formoterol (BREZTRI AEROSPHERE) 160-9-4.8 MCG/ACT AERO Inhale 2 puffs into the lungs in the morning and at bedtime. 10.7 g 11  ? carvedilol (COREG) 25 MG tablet Take 0.5 tablets (12.5 mg total) by mouth in the morning and at bedtime. 60 tablet 3  ? dexamethasone (DECADRON) 4 MG tablet Take 2 tablets (8 mg total) by mouth daily. Start the day after chemotherapy for 2 days. 30 tablet 1  ? esomeprazole (NEXIUM) 40 MG packet Take 40 mg by mouth daily before breakfast. 30 each 2  ? furosemide (LASIX) 40 MG tablet Take 1 tablet (40 mg total) by mouth as needed. 90 tablet 2  ? lamoTRIgine (LAMICTAL) 100 MG tablet Take 100 mg by mouth 2 (two) times daily.    ? lidocaine-prilocaine (EMLA) cream Apply to affected area once 30 g 3  ?  LORazepam (ATIVAN) 0.5 MG tablet Take 1 tablet (0.5 mg total) by mouth every 6 (six) hours as needed (Nausea or vomiting). 30 tablet 0  ? Multiple Vitamin (MULTI-VITAMIN) tablet Take 1 tablet by mouth daily.    ? ondansetron (ZOFRAN) 8 MG tablet Take 1 tablet (8 mg total) by mouth 2 (two) times daily as needed for refractory nausea / vomiting. Start on day 3 after chemo. 30 tablet 1  ? prochlorperazine (COMPAZINE) 10 MG tablet Take 1 tablet (10 mg total) by mouth every 6 (six) hours as needed (Nausea or vomiting). 30 tablet 1  ? sacubitril-valsartan (ENTRESTO) 49-51 MG Take 0.5 tablets by mouth in the morning and at bedtime.    ? sucralfate (CARAFATE) 1 g tablet Take 1 tablet (1 g total) by mouth 3 (three) times daily. Dissolve in 3-4 tbsp warm water, swish and swallow. 90 tablet 0  ? tamsulosin (FLOMAX) 0.4 MG CAPS capsule Take 1 capsule (0.4 mg total) by mouth daily. 90 capsule 0  ? traMADol (ULTRAM) 50 MG tablet Take 1 tablet (50 mg total) by mouth  every 6 (six) hours as needed. 60 tablet 0  ? ?No current facility-administered medications for this visit.  ? ? ?VITAL SIGNS: ?There were no vitals taken for this visit. ?There were no vitals filed for this vis

## 2021-10-07 NOTE — Addendum Note (Signed)
Addended by: Altha Harm R on: 10/07/2021 02:10 PM ? ? Modules accepted: Orders ? ?

## 2021-10-07 NOTE — Progress Notes (Signed)
Radiation Oncology ?Follow up Note ? ?Name: Richard Wiggins   ?Date:   10/07/2021 ?MRN:  [id number] ?DOB: 1951/06/29  ? ? ?This 71 y.o. male presents to the clinic today for 1 month follow-up status post concurrent chemoradiation therapy to his left lung for stage III non-small cell lung cancer and patient previously treated with SBRT 15 months prior. ? ?REFERRING PROVIDER: Chrismon, Richard Muff, PA-C ? ?HPI: Patient is a 71 year old male now at 1 month having completed concurrent chemoradiation therapy for stage IIIa non-small cell lung cancer.  Patient opted for a 10 fraction hypofractionated course of treatment along with 2 cycles of weekly CarboTaxol.  He has been having some trace hemoptysis only 2 episodes and a nosebleed.  He is on Eliquis.  He has a CT scan scheduled in the next several weeks.  Specifically denies any change in pulmonary status or significant productive cough. ? ?COMPLICATIONS OF TREATMENT: none ? ?FOLLOW UP COMPLIANCE: keeps appointments  ? ?PHYSICAL EXAM:  ?BP 113/68   Pulse 96   Temp 99.4 ?F (37.4 ?C)   Ht 5\' 10"  (1.778 m)   Wt 184 lb 8 oz (83.7 kg)   BMI 26.47 kg/m?  ?Richard Wiggins male wheelchair-bound on nasal oxygen in NAD.  Well-developed well-nourished patient in NAD. HEENT reveals PERLA, EOMI, discs not visualized.  Oral cavity is clear. No oral mucosal lesions are identified. Neck is clear without evidence of cervical or supraclavicular adenopathy. Lungs are clear to A&P. Cardiac examination is essentially unremarkable with regular rate and rhythm without murmur rub or thrill. Abdomen is benign with no organomegaly or masses noted. Motor sensory and DTR levels are equal and symmetric in the upper and lower extremities. Cranial nerves II through XII are grossly intact. Proprioception is intact. No peripheral adenopathy or edema is identified. No motor or sensory levels are noted. Crude visual fields are within normal range. ? ?RADIOLOGY RESULTS: No current films for  review ? ?PLAN: Present time patient will follow-up with medical oncology.  Most likely the trace mopped assist will not affect his overall counts.  Also Eliquis is certainly responsible for some of this problem.  I have asked to see him back in 3 to 4 months for follow-up.  Would review any CT scans that are performed between now and then.  Patient is to call with any concerns. ? ?I would like to take this opportunity to thank you for allowing me to participate in the care of your patient.. ?  ? Richard Filbert, MD ? ?

## 2021-10-08 ENCOUNTER — Inpatient Hospital Stay: Payer: No Typology Code available for payment source

## 2021-10-08 ENCOUNTER — Ambulatory Visit: Payer: No Typology Code available for payment source

## 2021-10-08 VITALS — BP 130/73 | HR 68 | Temp 97.9°F | Resp 18

## 2021-10-08 DIAGNOSIS — D649 Anemia, unspecified: Secondary | ICD-10-CM

## 2021-10-08 DIAGNOSIS — Z95828 Presence of other vascular implants and grafts: Secondary | ICD-10-CM

## 2021-10-08 DIAGNOSIS — C3490 Malignant neoplasm of unspecified part of unspecified bronchus or lung: Secondary | ICD-10-CM

## 2021-10-08 DIAGNOSIS — C3412 Malignant neoplasm of upper lobe, left bronchus or lung: Secondary | ICD-10-CM | POA: Diagnosis not present

## 2021-10-08 MED ORDER — DIPHENHYDRAMINE HCL 25 MG PO CAPS
25.0000 mg | ORAL_CAPSULE | Freq: Once | ORAL | Status: AC
  Administered 2021-10-08: 25 mg via ORAL
  Filled 2021-10-08: qty 1

## 2021-10-08 MED ORDER — ACETAMINOPHEN 325 MG PO TABS
650.0000 mg | ORAL_TABLET | Freq: Once | ORAL | Status: AC
  Administered 2021-10-08: 650 mg via ORAL
  Filled 2021-10-08: qty 2

## 2021-10-08 MED ORDER — SODIUM CHLORIDE 0.9% IV SOLUTION
250.0000 mL | Freq: Once | INTRAVENOUS | Status: AC
  Administered 2021-10-08: 250 mL via INTRAVENOUS
  Filled 2021-10-08: qty 250

## 2021-10-08 MED ORDER — HEPARIN SOD (PORK) LOCK FLUSH 100 UNIT/ML IV SOLN
500.0000 [IU] | Freq: Once | INTRAVENOUS | Status: AC
  Administered 2021-10-08: 500 [IU] via INTRAVENOUS
  Filled 2021-10-08: qty 5

## 2021-10-08 MED ORDER — SODIUM CHLORIDE 0.9% FLUSH
10.0000 mL | INTRAVENOUS | Status: DC | PRN
  Administered 2021-10-08: 10 mL via INTRAVENOUS
  Filled 2021-10-08: qty 10

## 2021-10-09 ENCOUNTER — Ambulatory Visit: Payer: No Typology Code available for payment source

## 2021-10-09 LAB — TYPE AND SCREEN
ABO/RH(D): B POS
Antibody Screen: NEGATIVE
Unit division: 0

## 2021-10-09 LAB — BPAM RBC
Blood Product Expiration Date: 202304242359
ISSUE DATE / TIME: 202303300915
Unit Type and Rh: 7300

## 2021-10-11 ENCOUNTER — Emergency Department: Payer: No Typology Code available for payment source

## 2021-10-11 ENCOUNTER — Emergency Department
Admission: EM | Admit: 2021-10-11 | Discharge: 2021-10-11 | Disposition: A | Discharge status: Home / Self Care | Payer: No Typology Code available for payment source | Attending: Emergency Medicine | Admitting: Emergency Medicine

## 2021-10-11 ENCOUNTER — Other Ambulatory Visit: Payer: Self-pay

## 2021-10-11 ENCOUNTER — Encounter: Payer: Self-pay | Admitting: Oncology

## 2021-10-11 DIAGNOSIS — J449 Chronic obstructive pulmonary disease, unspecified: Secondary | ICD-10-CM | POA: Diagnosis not present

## 2021-10-11 DIAGNOSIS — R509 Fever, unspecified: Secondary | ICD-10-CM | POA: Insufficient documentation

## 2021-10-11 DIAGNOSIS — R7401 Elevation of levels of liver transaminase levels: Secondary | ICD-10-CM | POA: Diagnosis not present

## 2021-10-11 DIAGNOSIS — Z20822 Contact with and (suspected) exposure to covid-19: Secondary | ICD-10-CM | POA: Insufficient documentation

## 2021-10-11 DIAGNOSIS — R531 Weakness: Secondary | ICD-10-CM | POA: Diagnosis not present

## 2021-10-11 LAB — CBC WITH DIFFERENTIAL/PLATELET
Abs Immature Granulocytes: 0.04 10*3/uL (ref 0.00–0.07)
Basophils Absolute: 0 10*3/uL (ref 0.0–0.1)
Basophils Relative: 0 %
Eosinophils Absolute: 0 10*3/uL (ref 0.0–0.5)
Eosinophils Relative: 1 %
HCT: 24 % — ABNORMAL LOW (ref 39.0–52.0)
Hemoglobin: 7.4 g/dL — ABNORMAL LOW (ref 13.0–17.0)
Immature Granulocytes: 1 %
Lymphocytes Relative: 8 %
Lymphs Abs: 0.3 10*3/uL — ABNORMAL LOW (ref 0.7–4.0)
MCH: 27.3 pg (ref 26.0–34.0)
MCHC: 30.8 g/dL (ref 30.0–36.0)
MCV: 88.6 fL (ref 80.0–100.0)
Monocytes Absolute: 0.4 10*3/uL (ref 0.1–1.0)
Monocytes Relative: 11 %
Neutro Abs: 3.1 10*3/uL (ref 1.7–7.7)
Neutrophils Relative %: 79 %
Platelets: 352 10*3/uL (ref 150–400)
RBC: 2.71 MIL/uL — ABNORMAL LOW (ref 4.22–5.81)
RDW: 15.9 % — ABNORMAL HIGH (ref 11.5–15.5)
WBC: 3.9 10*3/uL — ABNORMAL LOW (ref 4.0–10.5)
nRBC: 0 % (ref 0.0–0.2)

## 2021-10-11 LAB — URINALYSIS, COMPLETE (UACMP) WITH MICROSCOPIC
Bacteria, UA: NONE SEEN
Bilirubin Urine: NEGATIVE
Glucose, UA: NEGATIVE mg/dL
Hgb urine dipstick: NEGATIVE
Ketones, ur: NEGATIVE mg/dL
Leukocytes,Ua: NEGATIVE
Nitrite: NEGATIVE
Protein, ur: NEGATIVE mg/dL
Specific Gravity, Urine: 1.005 (ref 1.005–1.030)
Squamous Epithelial / HPF: NONE SEEN (ref 0–5)
pH: 7 (ref 5.0–8.0)

## 2021-10-11 LAB — COMPREHENSIVE METABOLIC PANEL
ALT: 60 U/L — ABNORMAL HIGH (ref 0–44)
AST: 59 U/L — ABNORMAL HIGH (ref 15–41)
Albumin: 2.2 g/dL — ABNORMAL LOW (ref 3.5–5.0)
Alkaline Phosphatase: 97 U/L (ref 38–126)
Anion gap: 8 (ref 5–15)
BUN: 13 mg/dL (ref 8–23)
CO2: 26 mmol/L (ref 22–32)
Calcium: 8.3 mg/dL — ABNORMAL LOW (ref 8.9–10.3)
Chloride: 99 mmol/L (ref 98–111)
Creatinine, Ser: 1.16 mg/dL (ref 0.61–1.24)
GFR, Estimated: >60 mL/min (ref >60)
Glucose, Bld: 112 mg/dL — ABNORMAL HIGH (ref 70–99)
Potassium: 3.9 mmol/L (ref 3.5–5.1)
Sodium: 133 mmol/L — ABNORMAL LOW (ref 135–145)
Total Bilirubin: 1 mg/dL (ref 0.3–1.2)
Total Protein: 6.8 g/dL (ref 6.5–8.1)

## 2021-10-11 LAB — RESP PANEL BY RT-PCR (FLU A&B, COVID) ARPGX2
Influenza A by PCR: NEGATIVE
Influenza B by PCR: NEGATIVE
SARS Coronavirus 2 by RT PCR: NEGATIVE

## 2021-10-11 LAB — PROTIME-INR
INR: 1.7 — ABNORMAL HIGH (ref 0.8–1.2)
Prothrombin Time: 20 seconds — ABNORMAL HIGH (ref 11.4–15.2)

## 2021-10-11 LAB — LACTIC ACID, PLASMA: Lactic Acid, Venous: 0.7 mmol/L (ref 0.5–1.9)

## 2021-10-11 LAB — APTT: aPTT: 56 seconds — ABNORMAL HIGH (ref 24–36)

## 2021-10-11 MED ORDER — METRONIDAZOLE 500 MG/100ML IV SOLN
500.0000 mg | Freq: Once | INTRAVENOUS | Status: AC
  Administered 2021-10-11: 500 mg via INTRAVENOUS
  Filled 2021-10-11: qty 100

## 2021-10-11 MED ORDER — CEPHALEXIN 500 MG PO CAPS: 500.0000 mg | ORAL_CAPSULE | Freq: Three times a day (TID) | ORAL | 0 refills | Status: DC

## 2021-10-11 MED ORDER — LACTATED RINGERS IV BOLUS (SEPSIS)
1000.0000 mL | Freq: Once | INTRAVENOUS | Status: AC
  Administered 2021-10-11: 1000 mL via INTRAVENOUS

## 2021-10-11 MED ORDER — SODIUM CHLORIDE 0.9 % IV SOLN
2.0000 g | Freq: Once | INTRAVENOUS | Status: AC
  Administered 2021-10-11: 2 g via INTRAVENOUS
  Filled 2021-10-11: qty 2

## 2021-10-11 MED ORDER — SODIUM CHLORIDE 0.9 % IV BOLUS
1000.0000 mL | Freq: Once | INTRAVENOUS | Status: AC
Start: 2021-10-11 — End: 2021-10-11
  Administered 2021-10-11: 1000 mL via INTRAVENOUS

## 2021-10-11 MED ORDER — VANCOMYCIN HCL IN DEXTROSE 1-5 GM/200ML-% IV SOLN: 1000.0000 mg | Freq: Once | INTRAVENOUS | Status: DC

## 2021-10-11 MED ORDER — VANCOMYCIN HCL IN DEXTROSE 1-5 GM/200ML-% IV SOLN
1000.0000 mg | Freq: Once | INTRAVENOUS | Status: AC
  Administered 2021-10-11: 1000 mg via INTRAVENOUS
  Filled 2021-10-11: qty 200

## 2021-10-11 MED ORDER — AZITHROMYCIN 250 MG PO TABS: ORAL_TABLET | ORAL | 0 refills | Status: DC

## 2021-10-11 NOTE — ED Triage Notes (Signed)
Pt c/o non productive cough and fever today. Currently taking chemo for lung cancer. fall out of bed this evening. Denies any injury.  ?

## 2021-10-11 NOTE — ED Provider Notes (Signed)
? ?[facility] ?Provider Note ? ? ? Event Date/Time  ? First MD Initiated Contact with Patient 10/11/21 1926   ?  (approximate) ? ?History  ? ?Chief Complaint: No chief complaint on file. ? ?HPI ? ?Richard Wiggins is a 71 y.o. male with a past medical history of lung cancer currently on chemotherapy, COPD, presents to the emergency department for generalized weakness and a fever.  According to the patient for the past 2 days he has been coughing and has been feeling chills.  Earlier tonight patient was very weak and slumped out of bed, wife took his temperature and he was 101.6 so they came to the emergency department for evaluation.  Patient did receive Tylenol prior to coming to the emergency department.  Upon arrival patient is afebrile 98.4.  ? ?Physical Exam  ? ?Triage Vital Signs: ?ED Triage Vitals  ?Enc Vitals Group  ?   BP 10/11/21 1915 134/72  ?   Pulse Rate 10/11/21 1915 91  ?   Resp 10/11/21 1915 18  ?   Temp 10/11/21 1915 98.4 ?F (36.9 ?C)  ?   Temp Source 10/11/21 1915 Oral  ?   SpO2 10/11/21 1915 96 %  ?   Weight 10/11/21 1916 184 lb (83.5 kg)  ?   Height 10/11/21 1916 5\' 10"  (1.778 m)  ?   Head Circumference --   ?   Peak Flow --   ?   Pain Score 10/11/21 1916 0  ?   Pain Loc --   ?   Pain Edu? --   ?   Excl. in [place]? --   ? ? ?Most recent vital signs: ?Vitals:  ? 10/11/21 1915  ?BP: 134/72  ?Pulse: 91  ?Resp: 18  ?Temp: 98.4 ?F (36.9 ?C)  ?SpO2: 96%  ? ? ?General: Awake, no distress.  ?CV:  Good peripheral perfusion.  Regular rate and rhythm  ?Resp:  Normal effort.  Equal breath sounds bilaterally.  ?Abd:  No distention.  Soft, nontender.  No rebound or guarding. ? ? ?ED Results / Procedures / Treatments  ? ?EKG ? ?EKG viewed and interpreted by myself shows a normal sinus rhythm 87 bpm with a narrow QRS, normal axis, normal intervals, no concerning ST changes ? ?RADIOLOGY ? ?I personally reviewed the chest x-ray images, no acute finding on my evaluation, port present. ?Radiology  is read the chest x-ray is negative for acute finding.  Resolved focal opacity in left upper lobe. ? ? ?MEDICATIONS ORDERED IN ED: ?Medications  ?lactated ringers bolus 1,000 mL (has no administration in time range)  ?ceFEPIme (MAXIPIME) 2 g in sodium chloride 0.9 % 100 mL IVPB (has no administration in time range)  ?metroNIDAZOLE (FLAGYL) IVPB 500 mg (has no administration in time range)  ?vancomycin (VANCOCIN) IVPB 1000 mg/200 mL premix (has no administration in time range)  ? ? ? ?IMPRESSION / MDM / ASSESSMENT AND PLAN / ED COURSE  ?I reviewed the triage vital signs and the nursing notes. ? ?Patient presents to the emergency department for cough congestion chills febrile to 101.6 at home per wife.  Patient is currently on chemotherapy, recently was told that his blood counts were too low and had a skipped their last chemotherapy session.  Given the patient's reported fever of 101.6 at home along with cough weakness and being on chemotherapy we will check labs, cultures, chest x-ray, COVID/flu.  We will start on broad-spectrum antibiotics.  If the patient is neutropenic I anticipate he will  need to be admitted to the hospitalist service for ongoing treatment until his cultures result.  I discussed this plan of care with the patient who is agreeable. ? ?Chest x-ray shows no significant findings.  Patient's lab work shows a white blood cell count of 3.9 with a neutrophil count of 3.1.  Chemistry shows slight LFT elevation otherwise largely nonrevealing.  Lactic acid of 0.7.  COVID/flu pending.  Urine pending. ? ?COVID/flu is negative.  Urinalysis is normal.  I spoke to Dr. Grayland Ormond of oncology, given the patient's reassuring work-up he suggests discharging the patient on oral antibiotics Dr. Janese Banks will follow-up on the blood cultures tomorrow.  I spoke to the patient regarding this plan of care he is agreeable as well and would prefer to go home.  We will discharge on Keflex to be started tomorrow patient received IV  antibiotics tonight.  He will follow-up with oncology tomorrow. ? ? ? ?FINAL CLINICAL IMPRESSION(S) / ED DIAGNOSES  ? ?Fever ? ?Rx / DC Orders  ? ?Keflex ?Oncology follow-up ? ?Note:  This document was prepared using Dragon voice recognition software and may include unintentional dictation errors. ?  Harvest Dark, MD ?10/11/21 2327 ? ?

## 2021-10-11 NOTE — Progress Notes (Signed)
CODE SEPSIS - PHARMACY COMMUNICATION ? ?**Broad Spectrum Antibiotics should be administered within 1 hour of Sepsis diagnosis** ? ?Time Code Sepsis Called/Page Received: 1934 ? ?Antibiotics Ordered: vancomycin, cefepime, metronidazole ? ?Time of 1st antibiotic administration: 1948 ? ? ? ?Sherilyn Banker ,PharmD ?Clinical Pharmacist  ?10/11/2021  7:36 PM ? ?

## 2021-10-11 NOTE — ED Notes (Signed)
Pt discharge information reviewed. Pt understands need for follow up care and when to return if symptoms worsen. All questions answered. Pt is alert and oriented with even and regular respirations. Pt is seen ambulating out of department with string steady gait.   

## 2021-10-11 NOTE — Sepsis Progress Note (Signed)
Elink following code sepsis °

## 2021-10-11 NOTE — Progress Notes (Signed)
PHARMACY -  BRIEF ANTIBIOTIC NOTE  ? ?Pharmacy has received consult(s) for cefepime and vancomycin from an ED provider.  The patient's profile has been reviewed for ht/wt/allergies/indication/available labs.   ? ?One time order(s) placed for: ?Cefepime 2 g IV ?Vancomycin 2 g IV (1 g followed by 1 g) ? ?Further antibiotics/pharmacy consults should be ordered by admitting physician if indicated.       ?                ?Thank you, ?Jenifer Struve O Chirsty Armistead ?10/11/2021  7:38 PM ? ?

## 2021-10-12 ENCOUNTER — Encounter: Payer: Self-pay | Admitting: Oncology

## 2021-10-12 ENCOUNTER — Ambulatory Visit: Payer: No Typology Code available for payment source

## 2021-10-13 ENCOUNTER — Emergency Department: Payer: No Typology Code available for payment source

## 2021-10-13 ENCOUNTER — Emergency Department
Admission: EM | Admit: 2021-10-13 | Discharge: 2021-10-14 | Disposition: A | Discharge status: Home / Self Care | Payer: No Typology Code available for payment source | Attending: Emergency Medicine | Admitting: Emergency Medicine

## 2021-10-13 ENCOUNTER — Other Ambulatory Visit: Payer: Self-pay

## 2021-10-13 ENCOUNTER — Telehealth: Payer: Self-pay | Admitting: *Deleted

## 2021-10-13 ENCOUNTER — Ambulatory Visit: Payer: No Typology Code available for payment source

## 2021-10-13 ENCOUNTER — Ambulatory Visit
Admission: RE | Admit: 2021-10-13 | Discharge: 2021-10-13 | Disposition: A | Discharge status: Home / Self Care | Payer: No Typology Code available for payment source | Source: Ambulatory Visit | Attending: Oncology | Admitting: Oncology

## 2021-10-13 DIAGNOSIS — C3490 Malignant neoplasm of unspecified part of unspecified bronchus or lung: Secondary | ICD-10-CM | POA: Diagnosis present

## 2021-10-13 DIAGNOSIS — Z7982 Long term (current) use of aspirin: Secondary | ICD-10-CM | POA: Insufficient documentation

## 2021-10-13 DIAGNOSIS — R5383 Other fatigue: Secondary | ICD-10-CM | POA: Insufficient documentation

## 2021-10-13 DIAGNOSIS — M25551 Pain in right hip: Secondary | ICD-10-CM | POA: Diagnosis present

## 2021-10-13 DIAGNOSIS — J449 Chronic obstructive pulmonary disease, unspecified: Secondary | ICD-10-CM | POA: Diagnosis not present

## 2021-10-13 DIAGNOSIS — N183 Chronic kidney disease, stage 3 unspecified: Secondary | ICD-10-CM | POA: Insufficient documentation

## 2021-10-13 DIAGNOSIS — Z85118 Personal history of other malignant neoplasm of bronchus and lung: Secondary | ICD-10-CM | POA: Insufficient documentation

## 2021-10-13 DIAGNOSIS — I130 Hypertensive heart and chronic kidney disease with heart failure and stage 1 through stage 4 chronic kidney disease, or unspecified chronic kidney disease: Secondary | ICD-10-CM | POA: Diagnosis not present

## 2021-10-13 DIAGNOSIS — M009 Pyogenic arthritis, unspecified: Secondary | ICD-10-CM | POA: Insufficient documentation

## 2021-10-13 DIAGNOSIS — I5022 Chronic systolic (congestive) heart failure: Secondary | ICD-10-CM | POA: Insufficient documentation

## 2021-10-13 DIAGNOSIS — I480 Paroxysmal atrial fibrillation: Secondary | ICD-10-CM | POA: Insufficient documentation

## 2021-10-13 DIAGNOSIS — I2510 Atherosclerotic heart disease of native coronary artery without angina pectoris: Secondary | ICD-10-CM | POA: Insufficient documentation

## 2021-10-13 LAB — COMPREHENSIVE METABOLIC PANEL
ALT: 48 U/L — ABNORMAL HIGH (ref 0–44)
AST: 48 U/L — ABNORMAL HIGH (ref 15–41)
Albumin: 2.4 g/dL — ABNORMAL LOW (ref 3.5–5.0)
Alkaline Phosphatase: 102 U/L (ref 38–126)
Anion gap: 10 (ref 5–15)
BUN: 14 mg/dL (ref 8–23)
CO2: 24 mmol/L (ref 22–32)
Calcium: 8.4 mg/dL — ABNORMAL LOW (ref 8.9–10.3)
Chloride: 98 mmol/L (ref 98–111)
Creatinine, Ser: 1.09 mg/dL (ref 0.61–1.24)
GFR, Estimated: >60 mL/min (ref >60)
Glucose, Bld: 97 mg/dL (ref 70–99)
Potassium: 3.9 mmol/L (ref 3.5–5.1)
Sodium: 132 mmol/L — ABNORMAL LOW (ref 135–145)
Total Bilirubin: 0.6 mg/dL (ref 0.3–1.2)
Total Protein: 7.2 g/dL (ref 6.5–8.1)

## 2021-10-13 LAB — CBC WITH DIFFERENTIAL/PLATELET
Abs Immature Granulocytes: 0.06 10*3/uL (ref 0.00–0.07)
Basophils Absolute: 0 10*3/uL (ref 0.0–0.1)
Basophils Relative: 0 %
Eosinophils Absolute: 0 10*3/uL (ref 0.0–0.5)
Eosinophils Relative: 1 %
HCT: 26.6 % — ABNORMAL LOW (ref 39.0–52.0)
Hemoglobin: 8.2 g/dL — ABNORMAL LOW (ref 13.0–17.0)
Immature Granulocytes: 1 %
Lymphocytes Relative: 11 %
Lymphs Abs: 0.5 10*3/uL — ABNORMAL LOW (ref 0.7–4.0)
MCH: 27.6 pg (ref 26.0–34.0)
MCHC: 30.8 g/dL (ref 30.0–36.0)
MCV: 89.6 fL (ref 80.0–100.0)
Monocytes Absolute: 0.4 10*3/uL (ref 0.1–1.0)
Monocytes Relative: 10 %
Neutro Abs: 3.2 10*3/uL (ref 1.7–7.7)
Neutrophils Relative %: 77 %
Platelets: 356 10*3/uL (ref 150–400)
RBC: 2.97 MIL/uL — ABNORMAL LOW (ref 4.22–5.81)
RDW: 16 % — ABNORMAL HIGH (ref 11.5–15.5)
WBC: 4.2 10*3/uL (ref 4.0–10.5)
nRBC: 0 % (ref 0.0–0.2)

## 2021-10-13 LAB — URINALYSIS, COMPLETE (UACMP) WITH MICROSCOPIC
Bacteria, UA: NONE SEEN
Bilirubin Urine: NEGATIVE
Glucose, UA: NEGATIVE mg/dL
Hgb urine dipstick: NEGATIVE
Ketones, ur: 5 mg/dL — AB
Leukocytes,Ua: NEGATIVE
Nitrite: NEGATIVE
Protein, ur: 30 mg/dL — AB
Specific Gravity, Urine: 1.036 — ABNORMAL HIGH (ref 1.005–1.030)
pH: 5 (ref 5.0–8.0)

## 2021-10-13 LAB — URINE CULTURE: Culture: NO GROWTH

## 2021-10-13 LAB — LACTIC ACID, PLASMA: Lactic Acid, Venous: 0.9 mmol/L (ref 0.5–1.9)

## 2021-10-13 LAB — SEDIMENTATION RATE: Sed Rate: 128 mm/hr — ABNORMAL HIGH (ref 0–20)

## 2021-10-13 MED ORDER — IOHEXOL 300 MG/ML  SOLN
100.0000 mL | Freq: Once | INTRAMUSCULAR | Status: AC | PRN
  Administered 2021-10-13: 100 mL via INTRAVENOUS

## 2021-10-13 MED ORDER — POLYETHYLENE GLYCOL 3350 17 G PO PACK: 17.0000 g | PACK | Freq: Every day | ORAL | 0 refills | Status: DC

## 2021-10-13 MED ORDER — OXYCODONE HCL 5 MG PO TABS: 5.0000 mg | ORAL_TABLET | Freq: Three times a day (TID) | ORAL | 0 refills | Status: DC | PRN

## 2021-10-13 NOTE — Discharge Instructions (Signed)
He stopped taking the Keflex.  You should receive a phone call from Dr. Theodore Demark office tomorrow to schedule an appointment for an aspiration of the hip.  If you do not hear from them by tomorrow afternoon, please call the number above.  You can take the oxycodone as needed for pain.  Please start taking the MiraLAX as well as your Colace to avoid constipation. ?

## 2021-10-13 NOTE — ED Provider Notes (Signed)
? ?[facility] ?Provider Note ? ? ? Event Date/Time  ? First MD Initiated Contact with Patient 10/13/21 2029   ?  (approximate) ? ? ?History  ? ?Hip Pain ? ? ?HPI ? ?Richard Wiggins is a 71 y.o. male   with past medical history of hypertension, COPD, lung cancer on chemotherapy, history of right hip replacement back in 2008 in [place] presents with concern for right hip septic joint.  Patient has had on and off fevers for about a week.  Was seen in the ED 2 days ago with ultimately reassuring work-up and was discharged on Keflex.  Patient patient followed up with his oncologist Dr. Janese Banks who ordered a CT chest abdomen pelvis which shows a large right hip effusion.  After this result patient does admit to some right hip pain but is able to bear weight.  Denies shortness of breath nausea vomiting or abdominal pain. ? ?  ? ?Past Medical History:  ?Diagnosis Date  ? AAA (abdominal aortic aneurysm) ([place]) 2020  ? a. 2020 U/S: 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 -plan medical managmment with tight bp control; b. 09/2020 Abd U/S: 4.1cm.  ? Anxiety   ? Aortic atherosclerosis ([place])   ? Benign essential HTN 02/10/2020  ? BPH (benign prostatic hyperplasia) 02/09/2020  ? Brain aneurysm   ? Cancer of upper lobe of left lung ([place]) 04/2020  ? a.) ENB/EBUS performed; Bx non-diagnostic. b.) presumed stage I NSCLC in the LUL; underwent SBRT (60 Gy over 5 fractions)  ? Carotid arterial disease ([place])   ? a. 05/2017 s/p R carotid endarterectomy following CVA.  ? 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)   ? a. 2018 Echo: EF 30-35%; b. 02/2021 Echo: EF 55-60%, nl RV fxn. Triv MR. Asc Ao 70mm.  ? CKD (chronic kidney disease), stage III ([place])   ? Claudication [facility])   ? a. 09/2020 ABIs: R 0.91. L 0.99.  ? COPD (chronic obstructive pulmonary disease) ([place])   ? Coronary artery disease   ? a. 06/2017 PCI ([place]): s/p PCI to the RCA. LAD 50, LCX 20.  ? Depression   ? 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 a. 02/2021 Asc Ao 53mm.  ? Emphysema of lung ([place])   ? HLD (hyperlipidemia)   ? IDA (iron deficiency anemia) 02/09/2020  ? 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  ? Ischemic cardiomyopathy   ? Long term current use of anticoagulant   ? a.) apixaban  ? Marijuana use, continuous 02/10/2020  ? Osteoporosis   ? PAF (paroxysmal atrial fibrillation) ([place])   ? a. 05/2017 Dx in setting of CVA. CHA2DS2VASc = 6-->Eliquis.  ? Pre-diabetes   ? Pulmonary emboli ([place]) 06/07/2017  ? Right lower lobe pulmonary embolism small segmental, multifocal multifocal pneumonia, mediastinal lymphadenopathy, moderate centrilobular emphysema  ? Seizures ([place])   ? Sigmoid diverticulosis   ? a.) CT 01/27/2021: severe  ? Stroke [facility])   ? a. 05/2017 - hospitalized in [place]->prolonged hospitalization in setting of R CEA, PE, and finding of RCA dzs on cath; b. Residual left sided weakness to arm and leg.  ? ? ?Patient Active Problem List  ? Diagnosis Date Noted  ? Dehydration   ? Epistaxis 09/23/2021  ? Anemia 09/23/2021  ? Lung cancer ([place]) 08/18/2021  ? Renal artery stenosis ([place]) 08/11/2021  ? CKD (chronic kidney disease), stage III ([place]) 03/11/2021  ? 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 Iron deficiency anemia  02/04/2021  ? Long term (current) use of anticoagulants 11/12/2020  ? Positive colorectal cancer screening using Cologuard test 05/22/2020  ? Goals of care, counseling/discussion 05/06/2020  ? Malignant neoplasm of upper lobe of left lung ([place]) 05/06/2020  ? Preventative health care 03/26/2020  ? Atherosclerosis of native arteries of extremity with intermittent claudication ([place]) 03/14/2020  ? Trigger middle finger of right hand 02/20/2020  ? Abdominal aortic aneurysm (AAA) without rupture ([place]) 02/20/2020  ? 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  ? Benign essential HTN 02/10/2020  ? Pre-diabetes 02/10/2020  ? BPH (benign prostatic hyperplasia) 02/09/2020  ? Pancytopenia ([place]) 02/09/2020  ? Weakness  of lower extremity 02/01/2020  ? COPD (chronic obstructive pulmonary disease) ([place]) 02/01/2020  ? Chronic systolic CHF (congestive heart failure) ([place]) 02/01/2020  ? Depression 02/01/2020  ? A-fib ([place]) 02/01/2020  ? CVA (cerebral vascular accident) ([place]) 02/01/2020  ? Coronary artery disease 02/01/2020  ? Carotid artery stenosis 02/01/2020  ? HLD (hyperlipidemia) 02/01/2020  ? Carotid stenosis, asymptomatic, left 02/14/2018  ? S/P coronary artery stent placement 09/20/2017  ? Centrilobular emphysema ([place]) 06/10/2017  ? Chronic back pain 06/10/2017  ? DOE (dyspnea on exertion) 06/10/2017  ? Elevated TSH 06/10/2017  ? Hematoma of groin 06/10/2017  ? PAD (peripheral artery disease) ([place]) 06/10/2017  ? Pulmonary embolism ([place]) 06/10/2017  ? Tobacco abuse 06/10/2017  ? Unintended weight loss 06/10/2017  ? AAA (abdominal aortic aneurysm) ([place]) 06/10/2017  ? PAF (paroxysmal atrial fibrillation) ([place]) 06/10/2017  ? Systolic CHF, chronic ([place]) 06/10/2017  ? Hypertension 05/31/2017  ? Other hyperlipidemia 05/31/2017  ? Hemiparesis affecting left side as late effect of cerebrovascular accident ([place]) 05/29/2017  ? Carotid artery stenosis, symptomatic, right 05/29/2017  ? Embolic stroke involving right middle cerebral artery ([place]) 05/28/2017  ? ? ? ?Physical Exam  ?Triage Vital Signs: ?ED Triage Vitals  ?Enc Vitals Group  ?   BP 10/13/21 1958 (!) 164/84  ?   Pulse Rate 10/13/21 1958 88  ?   Resp 10/13/21 1958 18  ?   Temp 10/13/21 1958 98.5 ?F (36.9 ?C)  ?   Temp Source 10/13/21 1958 Oral  ?   SpO2 10/13/21 1958 93 %  ?   Weight 10/13/21 2006 184 lb (83.5 kg)  ?   Height 10/13/21 2006 5\' 10"  (1.778 m)  ?   Head Circumference --   ?   Peak Flow --   ?   Pain Score 10/13/21 2006 6  ?   Pain Loc --   ?   Pain Edu? --   ?   Excl. in [place]? --   ? ? ?Most recent vital signs: ?Vitals:  ? 10/13/21 2130 10/13/21 2327  ?BP: (!) 161/81 (!) 163/87  ?Pulse: 80 84  ?Resp: (!) 23 20  ?Temp:    ?SpO2: 100% 99%  ? ? ? ?General: Awake, no  distress.  ?CV:  Good peripheral perfusion.  ?Resp:  Normal effort.  ?Abd:  No distention.  No tenderness to palpation ?Neuro:             Awake, Alert, Oriented x 3  ?Other:  Right hip is nontender to palpation, he is able to flex, pain with internal/external rotation ? ? ?ED Results / Procedures / Treatments  ?Labs ?(all labs ordered are listed, but only abnormal results are displayed) ?Labs Reviewed  ?COMPREHENSIVE METABOLIC PANEL - Abnormal; Notable for the following components:  ?    Result Value  ? Sodium 132 (*)   ? Calcium 8.4 (*)   ?  Albumin 2.4 (*)   ? AST 48 (*)   ? ALT 48 (*)   ? All other components within normal limits  ?CBC WITH DIFFERENTIAL/PLATELET - Abnormal; Notable for the following components:  ? RBC 2.97 (*)   ? Hemoglobin 8.2 (*)   ? HCT 26.6 (*)   ? RDW 16.0 (*)   ? Lymphs Abs 0.5 (*)   ? All other components within normal limits  ?SEDIMENTATION RATE - Abnormal; Notable for the following components:  ? Sed Rate 128 (*)   ? All other components within normal limits  ?URINALYSIS, COMPLETE (UACMP) WITH MICROSCOPIC - Abnormal; Notable for the following components:  ? Color, Urine YELLOW (*)   ? APPearance CLEAR (*)   ? Specific Gravity, Urine 1.036 (*)   ? Ketones, ur 5 (*)   ? Protein, ur 30 (*)   ? All other components within normal limits  ?CULTURE, BLOOD (ROUTINE X 2)  ?CULTURE, BLOOD (ROUTINE X 2)  ?LACTIC ACID, PLASMA  ?LACTIC ACID, PLASMA  ?C-REACTIVE PROTEIN  ? ? ? ?EKG ? ? ? ? ?RADIOLOGY ? ?I reviewed the CXR which does not show any acute cardiopulmonary process; agree with radiology report  ? ? ?PROCEDURES: ? ?Critical Care performed: No ? ?Procedures ? ?The patient is on the cardiac monitor to evaluate for evidence of arrhythmia and/or significant heart rate changes. ? ? ?MEDICATIONS ORDERED IN ED: ?Medications - No data to display ? ? ?IMPRESSION / MDM / ASSESSMENT AND PLAN / ED COURSE  ?I reviewed the triage vital signs and the nursing notes. ?             ?                ? ?Differential diagnosis includes, but is not limited to, septic prosthetic hip ? ?The patient is a 71 year old male on chemotherapy for lung cancer who presents with concern for septic right hip.  Has had intermitten

## 2021-10-13 NOTE — Telephone Encounter (Addendum)
Called at 5:35 pm and got wife and got voicemail. Explained that pt. Had CT scan today and the radiologist called Dr. Janese Banks with the results. From lung cancer standpoint it looked good but the right hip :Right hip prosthesis with new large right hip effusion thought to be continuous with right iliopsoas bursitis, and with associated gas ?density in the iliopsoas bursa, concerning for septic right hip ?joint. Urgent referral for orthopedic management recommended. ?The radiologist thought that right hip had hip effusion and that was concerning for septic hip and needs to go to ER so they can get urgent ref to ortho. Left my number to call me . ?I called again at 7:09 pm and got message again and left message to see if they got my first call wanting pt. To go tto the ER to see if he has possible septic right hip based on results on ct scan ?Wife called back and said she was right with the cell phone and it was not ringing to let her know that she had calls. She is getting pt. Up from a nap and sending him to ER which was told to do from radiologist that read the results as well as dr Janese Banks rec: ER also ?

## 2021-10-13 NOTE — ED Triage Notes (Signed)
Had CT this morning for post chemo and radiation eval. 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 Oncologist advised pt to come to ED for concerns of septic hip. Pt c/o pain to right hip and groin area. Hip replacement in 2008.  ?

## 2021-10-14 ENCOUNTER — Encounter: Payer: Self-pay | Admitting: Oncology

## 2021-10-14 ENCOUNTER — Ambulatory Visit: Payer: No Typology Code available for payment source

## 2021-10-14 ENCOUNTER — Other Ambulatory Visit
Admission: RE | Admit: 2021-10-14 | Discharge: 2021-10-14 | Disposition: A | Discharge status: Home / Self Care | Payer: Medicare Other | Source: Ambulatory Visit | Attending: Sports Medicine | Admitting: Sports Medicine

## 2021-10-14 DIAGNOSIS — M7071 Other bursitis of hip, right hip: Secondary | ICD-10-CM | POA: Insufficient documentation

## 2021-10-14 DIAGNOSIS — Z96641 Presence of right artificial hip joint: Secondary | ICD-10-CM | POA: Diagnosis not present

## 2021-10-14 DIAGNOSIS — R0683 Snoring: Secondary | ICD-10-CM | POA: Diagnosis not present

## 2021-10-14 DIAGNOSIS — G4730 Sleep apnea, unspecified: Secondary | ICD-10-CM | POA: Diagnosis not present

## 2021-10-14 DIAGNOSIS — Z96649 Presence of unspecified artificial hip joint: Secondary | ICD-10-CM | POA: Diagnosis not present

## 2021-10-14 DIAGNOSIS — T8459XA Infection and inflammatory reaction due to other internal joint prosthesis, initial encounter: Secondary | ICD-10-CM | POA: Diagnosis not present

## 2021-10-14 DIAGNOSIS — M25451 Effusion, right hip: Secondary | ICD-10-CM | POA: Diagnosis not present

## 2021-10-14 LAB — SYNOVIAL CELL COUNT + DIFF, W/ CRYSTALS
Crystals, Fluid: NONE SEEN
Eosinophils-Synovial: 0 %
Lymphocytes-Synovial Fld: 8 %
Monocyte-Macrophage-Synovial Fluid: 1 %
Neutrophil, Synovial: 91 %
WBC, Synovial: 87333 /mm3 — ABNORMAL HIGH (ref 0–200)

## 2021-10-14 LAB — C-REACTIVE PROTEIN: CRP: 11.4 mg/dL — ABNORMAL HIGH (ref <1.0)

## 2021-10-14 LAB — LACTIC ACID, PLASMA: Lactic Acid, Venous: 0.8 mmol/L (ref 0.5–1.9)

## 2021-10-15 ENCOUNTER — Other Ambulatory Visit: Payer: Self-pay | Admitting: Orthopedic Surgery

## 2021-10-15 ENCOUNTER — Other Ambulatory Visit: Payer: Self-pay

## 2021-10-15 ENCOUNTER — Encounter: Payer: Self-pay | Admitting: Oncology

## 2021-10-15 ENCOUNTER — Encounter
Admission: RE | Admit: 2021-10-15 | Discharge: 2021-10-15 | Disposition: A | Discharge status: Home / Self Care | Payer: Medicare Other | Source: Ambulatory Visit | Attending: Orthopedic Surgery | Admitting: Orthopedic Surgery

## 2021-10-15 ENCOUNTER — Ambulatory Visit: Payer: No Typology Code available for payment source

## 2021-10-15 HISTORY — DX: Personal history of other medical treatment: Z92.89

## 2021-10-15 HISTORY — DX: Epistaxis: R04.0

## 2021-10-15 HISTORY — DX: Unilateral inguinal hernia, without obstruction or gangrene, not specified as recurrent: K40.90

## 2021-10-15 NOTE — Patient Instructions (Addendum)
Your procedure is scheduled on:10-16-21 Friday ?Report to the Registration Desk on the 1st floor of the [facility]. Then proceed to the 2nd floor Surgery Desk in the [facility] at 10:00 AM ? ?REMEMBER: ?Instructions that are not followed completely may result in serious medical risk, up to and including death; or upon the discretion of your surgeon and anesthesiologist your surgery may need to be rescheduled. ? ?Do not eat food after midnight the night before surgery.  ?No gum chewing, lozengers or hard candies. ? ?You may however, drink CLEAR liquids up to 2 hours before you are scheduled to arrive for your surgery. Do not drink anything within 2 hours of your scheduled arrival time. ? ?Clear liquids include: ?- water  ?- apple juice without pulp ?- gatorade (not RED colors) ?- black coffee or tea (Do NOT add milk or creamers to the coffee or tea) ?Do NOT drink anything that is not on this list. ? ?TAKE THESE MEDICATIONS THE MORNING OF SURGERY WITH A SIP OF WATER: ?-carvedilol (COREG) ?-lamoTRIgine (LAMICTAL) ?-oxyCODONE (ROXICODONE)  ?-You may take LORazepam (ATIVAN) the day of surgery for anxiety if needed ? ?Use your albuterol (PROVENTIL) Nebulizer and your BREZTRI  Inhaler the day of surgery and bring your albuterol (VENTOLIN HFA) inhaler the day of surgery ? ?Last dose of apixaban (ELIQUIS) was on 10-14-21 as instructed by Dr Rudene Christians ? ?Last dose of aspirin 81 MG was today on 10-15-21-Do NOT take Aspirin the day of surgery ? ?One week prior to surgery: ?Stop Anti-inflammatories (NSAIDS) such as Advil, Aleve, Ibuprofen, Motrin, Naproxen, Naprosyn and Aspirin based products such as Excedrin, Goodys Powder, BC Powder.You may however, take Tylenol if needed for pain up until the day of surgery ? ?No Alcohol for 24 hours before or after surgery. ? ?No Smoking including e-cigarettes for 24 hours prior to surgery.  ?No chewable tobacco products for at least 6 hours prior to surgery.  ?No nicotine patches on the day  of surgery. ? ?Do not use any "recreational" drugs for at least a week prior to your surgery.  ?Please be advised that the combination of cocaine and anesthesia may have negative outcomes, up to and including death. ?If you test positive for cocaine, your surgery will be cancelled. ? ?On the morning of surgery brush your teeth with toothpaste and water, you may rinse your mouth with mouthwash if you wish. ?Do not swallow any toothpaste or mouthwash. ? ?Do not wear jewelry, make-up, hairpins, clips or nail polish. ? ?Do not wear lotions, powders, or perfumes.  ? ?Do not shave body from the neck down 48 hours prior to surgery just in case you cut yourself which could leave a site for infection.  ?Also, freshly shaved skin may become irritated if using the CHG soap. ? ?Contact lenses, hearing aids and dentures may not be worn into surgery. ? ?Do not bring valuables to the hospital. [facility] is not responsible for any missing/lost belongings or valuables. .  ? ?Notify your doctor if there is any change in your medical condition (cold, fever, infection). ? ?Wear comfortable clothing (specific to your surgery type) to the hospital. ? ?After surgery, you can help prevent lung complications by doing breathing exercises.  ?Take deep breaths and cough every 1-2 hours. Your doctor may order a device called an Incentive Spirometer to help you take deep breaths. ?When coughing or sneezing, hold a pillow firmly against your incision with both hands. This is called ?splinting.? Doing this helps protect your  incision. It also decreases belly discomfort. ? ?If you are being admitted to the hospital overnight, leave your suitcase in the car. ?After surgery it may be brought to your room. ? ?If you are being discharged the day of surgery, you will not be allowed to drive home. ?You will need a responsible adult (18 years or older) to drive you home and stay with you that night.  ? ?If you are taking public transportation, you will  need to have a responsible adult (18 years or older) with you. ?Please confirm with your physician that it is acceptable to use public transportation.  ? ?Please call the [place] Dept. at [phone] if you have any questions about these instructions. ? ?Surgery Visitation Policy: ? ?Patients undergoing a surgery or procedure may have two family members or support persons with them as long as the person is not COVID-19 positive or experiencing its symptoms.  ? ?Inpatient Visitation:   ? ?Visiting hours are 7 a.m. to 8 p.m. ?Up to four visitors are allowed at one time in a patient room, including children. The visitors may rotate out with other people during the day. One designated support person (adult) may remain overnight.  ?

## 2021-10-16 ENCOUNTER — Other Ambulatory Visit: Payer: Self-pay

## 2021-10-16 ENCOUNTER — Inpatient Hospital Stay: Payer: No Typology Code available for payment source | Admitting: Oncology

## 2021-10-16 ENCOUNTER — Ambulatory Visit: Payer: No Typology Code available for payment source | Admitting: Registered Nurse

## 2021-10-16 ENCOUNTER — Inpatient Hospital Stay
Admission: AD | Admit: 2021-10-16 | Discharge: 2021-10-20 | DRG: 464 | Disposition: A | Discharge status: Home Health | Payer: No Typology Code available for payment source | Attending: Orthopedic Surgery | Admitting: Orthopedic Surgery

## 2021-10-16 ENCOUNTER — Encounter
Admission: AD | Disposition: A | Discharge status: Home Health | Payer: Self-pay | Source: Home / Self Care | Attending: Orthopedic Surgery

## 2021-10-16 ENCOUNTER — Ambulatory Visit: Payer: No Typology Code available for payment source

## 2021-10-16 ENCOUNTER — Encounter: Payer: Self-pay | Admitting: Orthopedic Surgery

## 2021-10-16 ENCOUNTER — Inpatient Hospital Stay: Payer: No Typology Code available for payment source

## 2021-10-16 DIAGNOSIS — I255 Ischemic cardiomyopathy: Secondary | ICD-10-CM | POA: Diagnosis present

## 2021-10-16 DIAGNOSIS — F32A Depression, unspecified: Secondary | ICD-10-CM | POA: Diagnosis present

## 2021-10-16 DIAGNOSIS — Z823 Family history of stroke: Secondary | ICD-10-CM

## 2021-10-16 DIAGNOSIS — T8459XA Infection and inflammatory reaction due to other internal joint prosthesis, initial encounter: Secondary | ICD-10-CM

## 2021-10-16 DIAGNOSIS — E785 Hyperlipidemia, unspecified: Secondary | ICD-10-CM | POA: Diagnosis present

## 2021-10-16 DIAGNOSIS — C3412 Malignant neoplasm of upper lobe, left bronchus or lung: Secondary | ICD-10-CM | POA: Diagnosis present

## 2021-10-16 DIAGNOSIS — I69354 Hemiplegia and hemiparesis following cerebral infarction affecting left non-dominant side: Secondary | ICD-10-CM | POA: Diagnosis not present

## 2021-10-16 DIAGNOSIS — Y831 Surgical operation with implant of artificial internal device as the cause of abnormal reaction of the patient, or of later complication, without mention of misadventure at the time of the procedure: Secondary | ICD-10-CM | POA: Diagnosis present

## 2021-10-16 DIAGNOSIS — N400 Enlarged prostate without lower urinary tract symptoms: Secondary | ICD-10-CM | POA: Diagnosis present

## 2021-10-16 DIAGNOSIS — Z86711 Personal history of pulmonary embolism: Secondary | ICD-10-CM

## 2021-10-16 DIAGNOSIS — Z20822 Contact with and (suspected) exposure to covid-19: Secondary | ICD-10-CM | POA: Diagnosis present

## 2021-10-16 DIAGNOSIS — Z955 Presence of coronary angioplasty implant and graft: Secondary | ICD-10-CM

## 2021-10-16 DIAGNOSIS — D509 Iron deficiency anemia, unspecified: Secondary | ICD-10-CM | POA: Diagnosis present

## 2021-10-16 DIAGNOSIS — Z833 Family history of diabetes mellitus: Secondary | ICD-10-CM

## 2021-10-16 DIAGNOSIS — I5022 Chronic systolic (congestive) heart failure: Secondary | ICD-10-CM | POA: Diagnosis present

## 2021-10-16 DIAGNOSIS — R54 Age-related physical debility: Secondary | ICD-10-CM | POA: Diagnosis present

## 2021-10-16 DIAGNOSIS — D62 Acute posthemorrhagic anemia: Secondary | ICD-10-CM | POA: Diagnosis not present

## 2021-10-16 DIAGNOSIS — I110 Hypertensive heart disease with heart failure: Secondary | ICD-10-CM | POA: Diagnosis present

## 2021-10-16 DIAGNOSIS — Z96641 Presence of right artificial hip joint: Secondary | ICD-10-CM | POA: Diagnosis not present

## 2021-10-16 DIAGNOSIS — J432 Centrilobular emphysema: Secondary | ICD-10-CM | POA: Diagnosis present

## 2021-10-16 DIAGNOSIS — I739 Peripheral vascular disease, unspecified: Secondary | ICD-10-CM | POA: Diagnosis present

## 2021-10-16 DIAGNOSIS — Z79899 Other long term (current) drug therapy: Secondary | ICD-10-CM

## 2021-10-16 DIAGNOSIS — Z7901 Long term (current) use of anticoagulants: Secondary | ICD-10-CM

## 2021-10-16 DIAGNOSIS — I7143 Infrarenal abdominal aortic aneurysm, without rupture: Secondary | ICD-10-CM | POA: Diagnosis present

## 2021-10-16 DIAGNOSIS — I2510 Atherosclerotic heart disease of native coronary artery without angina pectoris: Secondary | ICD-10-CM | POA: Diagnosis present

## 2021-10-16 DIAGNOSIS — C3490 Malignant neoplasm of unspecified part of unspecified bronchus or lung: Secondary | ICD-10-CM

## 2021-10-16 DIAGNOSIS — D7589 Other specified diseases of blood and blood-forming organs: Secondary | ICD-10-CM | POA: Diagnosis present

## 2021-10-16 DIAGNOSIS — I700 Atherosclerosis of aorta: Secondary | ICD-10-CM | POA: Diagnosis present

## 2021-10-16 DIAGNOSIS — I480 Paroxysmal atrial fibrillation: Secondary | ICD-10-CM | POA: Diagnosis present

## 2021-10-16 DIAGNOSIS — T8451XA Infection and inflammatory reaction due to internal right hip prosthesis, initial encounter: Secondary | ICD-10-CM | POA: Diagnosis not present

## 2021-10-16 DIAGNOSIS — D649 Anemia, unspecified: Secondary | ICD-10-CM

## 2021-10-16 DIAGNOSIS — Z923 Personal history of irradiation: Secondary | ICD-10-CM

## 2021-10-16 DIAGNOSIS — F419 Anxiety disorder, unspecified: Secondary | ICD-10-CM | POA: Diagnosis present

## 2021-10-16 DIAGNOSIS — R251 Tremor, unspecified: Secondary | ICD-10-CM | POA: Diagnosis not present

## 2021-10-16 DIAGNOSIS — Z7982 Long term (current) use of aspirin: Secondary | ICD-10-CM

## 2021-10-16 DIAGNOSIS — T451X5A Adverse effect of antineoplastic and immunosuppressive drugs, initial encounter: Secondary | ICD-10-CM | POA: Diagnosis present

## 2021-10-16 DIAGNOSIS — M810 Age-related osteoporosis without current pathological fracture: Secondary | ICD-10-CM | POA: Diagnosis present

## 2021-10-16 DIAGNOSIS — Z8249 Family history of ischemic heart disease and other diseases of the circulatory system: Secondary | ICD-10-CM

## 2021-10-16 DIAGNOSIS — Z8679 Personal history of other diseases of the circulatory system: Secondary | ICD-10-CM

## 2021-10-16 DIAGNOSIS — Z96649 Presence of unspecified artificial hip joint: Secondary | ICD-10-CM | POA: Diagnosis not present

## 2021-10-16 HISTORY — PX: INCISION AND DRAINAGE HIP: SHX1801

## 2021-10-16 LAB — CULTURE, BLOOD (ROUTINE X 2)
Culture: NO GROWTH
Culture: NO GROWTH
Special Requests: ADEQUATE
Special Requests: ADEQUATE

## 2021-10-16 SURGERY — IRRIGATION AND DEBRIDEMENT HIP
Anesthesia: General | Site: Hip | Laterality: Right

## 2021-10-16 MED ORDER — PROPOFOL 10 MG/ML IV BOLUS
INTRAVENOUS | Status: DC | PRN
  Administered 2021-10-16: 20 mg via INTRAVENOUS
  Administered 2021-10-16: 60 mg via INTRAVENOUS

## 2021-10-16 MED ORDER — HYDROMORPHONE HCL 2 MG PO TABS
1.0000 mg | ORAL_TABLET | ORAL | Status: DC | PRN
  Administered 2021-10-17 – 2021-10-19 (×3): 2 mg via ORAL
  Filled 2021-10-16 (×3): qty 1

## 2021-10-16 MED ORDER — APIXABAN 5 MG PO TABS
5.0000 mg | ORAL_TABLET | Freq: Two times a day (BID) | ORAL | Status: DC
  Administered 2021-10-17 – 2021-10-20 (×7): 5 mg via ORAL
  Filled 2021-10-16 (×7): qty 1

## 2021-10-16 MED ORDER — LIDOCAINE HCL (CARDIAC) PF 100 MG/5ML IV SOSY
PREFILLED_SYRINGE | INTRAVENOUS | Status: DC | PRN
  Administered 2021-10-16: 50 mg via INTRAVENOUS

## 2021-10-16 MED ORDER — METOCLOPRAMIDE HCL 10 MG PO TABS: 5.0000 mg | ORAL_TABLET | Freq: Three times a day (TID) | ORAL | Status: DC | PRN

## 2021-10-16 MED ORDER — FAMOTIDINE 20 MG PO TABS
ORAL_TABLET | ORAL | Status: AC
  Administered 2021-10-16: 20 mg via ORAL
  Filled 2021-10-16: qty 1

## 2021-10-16 MED ORDER — FAMOTIDINE 20 MG PO TABS
20.0000 mg | ORAL_TABLET | Freq: Once | ORAL | Status: AC
Start: 2021-10-16 — End: 2021-10-16

## 2021-10-16 MED ORDER — HYDROMORPHONE HCL 1 MG/ML IJ SOLN
0.5000 mg | INTRAMUSCULAR | Status: DC | PRN
  Administered 2021-10-18: 1 mg via INTRAVENOUS
  Filled 2021-10-16: qty 1

## 2021-10-16 MED ORDER — ONDANSETRON HCL 4 MG/2ML IJ SOLN
4.0000 mg | Freq: Four times a day (QID) | INTRAMUSCULAR | Status: DC | PRN
  Administered 2021-10-18: 4 mg via INTRAVENOUS
  Filled 2021-10-16: qty 2

## 2021-10-16 MED ORDER — MIDAZOLAM HCL 2 MG/2ML IJ SOLN
INTRAMUSCULAR | Status: DC | PRN
  Administered 2021-10-16 (×2): 1 mg via INTRAVENOUS

## 2021-10-16 MED ORDER — ALBUTEROL SULFATE HFA 108 (90 BASE) MCG/ACT IN AERS: 2.0000 | INHALATION_SPRAY | Freq: Four times a day (QID) | RESPIRATORY_TRACT | Status: DC | PRN

## 2021-10-16 MED ORDER — ALBUTEROL SULFATE (2.5 MG/3ML) 0.083% IN NEBU: 2.5000 mg | INHALATION_SOLUTION | Freq: Four times a day (QID) | RESPIRATORY_TRACT | Status: DC | PRN

## 2021-10-16 MED ORDER — SODIUM CHLORIDE 0.9 % IV SOLN
2.0000 g | Freq: Three times a day (TID) | INTRAVENOUS | Status: DC
  Administered 2021-10-16 – 2021-10-19 (×9): 2 g via INTRAVENOUS
  Filled 2021-10-16 (×5): qty 12.5
  Filled 2021-10-16: qty 2
  Filled 2021-10-16 (×5): qty 12.5

## 2021-10-16 MED ORDER — METHOCARBAMOL 1000 MG/10ML IJ SOLN
500.0000 mg | Freq: Four times a day (QID) | INTRAVENOUS | Status: DC | PRN
  Filled 2021-10-16: qty 5

## 2021-10-16 MED ORDER — LAMOTRIGINE 25 MG PO TABS
100.0000 mg | ORAL_TABLET | Freq: Two times a day (BID) | ORAL | Status: DC
Start: 2021-10-16 — End: 2021-10-21
  Administered 2021-10-16 – 2021-10-20 (×8): 100 mg via ORAL
  Filled 2021-10-16 (×8): qty 4

## 2021-10-16 MED ORDER — BACLOFEN 10 MG PO TABS
10.0000 mg | ORAL_TABLET | Freq: Three times a day (TID) | ORAL | Status: DC | PRN
  Administered 2021-10-19 – 2021-10-20 (×3): 10 mg via ORAL
  Filled 2021-10-16 (×3): qty 1

## 2021-10-16 MED ORDER — ACETAMINOPHEN 325 MG PO TABS
325.0000 mg | ORAL_TABLET | Freq: Four times a day (QID) | ORAL | Status: DC | PRN
  Administered 2021-10-19: 325 mg via ORAL
  Filled 2021-10-16: qty 1

## 2021-10-16 MED ORDER — METHOCARBAMOL 500 MG PO TABS
500.0000 mg | ORAL_TABLET | Freq: Four times a day (QID) | ORAL | Status: DC | PRN
  Administered 2021-10-17 – 2021-10-19 (×5): 500 mg via ORAL
  Filled 2021-10-16 (×5): qty 1

## 2021-10-16 MED ORDER — EPHEDRINE SULFATE (PRESSORS) 50 MG/ML IJ SOLN
INTRAMUSCULAR | Status: DC | PRN
Start: 2021-10-16 — End: 2021-10-16
  Administered 2021-10-16: 10 mg via INTRAVENOUS
  Administered 2021-10-16: 5 mg via INTRAVENOUS

## 2021-10-16 MED ORDER — CHLORHEXIDINE GLUCONATE 0.12 % MT SOLN: 15.0000 mL | Freq: Once | OROMUCOSAL | Status: AC

## 2021-10-16 MED ORDER — OXYCODONE HCL 5 MG PO TABS
5.0000 mg | ORAL_TABLET | ORAL | Status: DC | PRN
  Administered 2021-10-20: 10 mg via ORAL
  Administered 2021-10-20: 5 mg via ORAL
  Filled 2021-10-16: qty 1
  Filled 2021-10-16: qty 2

## 2021-10-16 MED ORDER — MIDAZOLAM HCL 2 MG/2ML IJ SOLN
INTRAMUSCULAR | Status: AC
Start: 2021-10-16 — End: ?
  Filled 2021-10-16: qty 2

## 2021-10-16 MED ORDER — ADULT MULTIVITAMIN W/MINERALS CH
1.0000 | ORAL_TABLET | Freq: Every day | ORAL | Status: DC
  Administered 2021-10-17 – 2021-10-20 (×4): 1 via ORAL
  Filled 2021-10-16 (×4): qty 1

## 2021-10-16 MED ORDER — ZOLPIDEM TARTRATE 5 MG PO TABS
5.0000 mg | ORAL_TABLET | Freq: Every evening | ORAL | Status: DC | PRN
  Administered 2021-10-17 – 2021-10-18 (×2): 5 mg via ORAL
  Filled 2021-10-16 (×2): qty 1

## 2021-10-16 MED ORDER — VANCOMYCIN HCL 1000 MG IV SOLR
INTRAVENOUS | Status: AC
  Filled 2021-10-16: qty 40

## 2021-10-16 MED ORDER — BUDESON-GLYCOPYRROL-FORMOTEROL 160-9-4.8 MCG/ACT IN AERO: 2.0000 | INHALATION_SPRAY | Freq: Two times a day (BID) | RESPIRATORY_TRACT | Status: DC

## 2021-10-16 MED ORDER — CEFAZOLIN SODIUM-DEXTROSE 2-4 GM/100ML-% IV SOLN
INTRAVENOUS | Status: AC
  Filled 2021-10-16: qty 100

## 2021-10-16 MED ORDER — PANTOPRAZOLE SODIUM 40 MG PO TBEC
40.0000 mg | DELAYED_RELEASE_TABLET | Freq: Every day | ORAL | Status: DC
  Administered 2021-10-17 – 2021-10-20 (×4): 40 mg via ORAL
  Filled 2021-10-16 (×4): qty 1

## 2021-10-16 MED ORDER — ONDANSETRON HCL 4 MG PO TABS: 4.0000 mg | ORAL_TABLET | Freq: Four times a day (QID) | ORAL | Status: DC | PRN

## 2021-10-16 MED ORDER — DEXAMETHASONE SODIUM PHOSPHATE 10 MG/ML IJ SOLN
INTRAMUSCULAR | Status: DC | PRN
  Administered 2021-10-16: 10 mg via INTRAVENOUS

## 2021-10-16 MED ORDER — ONDANSETRON HCL 4 MG PO TABS: 8.0000 mg | ORAL_TABLET | Freq: Two times a day (BID) | ORAL | Status: DC | PRN

## 2021-10-16 MED ORDER — VANCOMYCIN HCL IN DEXTROSE 1-5 GM/200ML-% IV SOLN
1000.0000 mg | INTRAVENOUS | Status: DC
Start: 2021-10-16 — End: 2021-10-16

## 2021-10-16 MED ORDER — FENTANYL CITRATE (PF) 100 MCG/2ML IJ SOLN: 25.0000 ug | INTRAMUSCULAR | Status: DC | PRN

## 2021-10-16 MED ORDER — SACUBITRIL-VALSARTAN 49-51 MG PO TABS
1.0000 | ORAL_TABLET | Freq: Two times a day (BID) | ORAL | Status: DC
  Administered 2021-10-16 – 2021-10-20 (×7): 1 via ORAL
  Filled 2021-10-16 (×10): qty 1

## 2021-10-16 MED ORDER — CEFAZOLIN SODIUM-DEXTROSE 2-4 GM/100ML-% IV SOLN
2.0000 g | INTRAVENOUS | Status: AC
  Administered 2021-10-16: 2 g via INTRAVENOUS

## 2021-10-16 MED ORDER — EPHEDRINE 5 MG/ML INJ
INTRAVENOUS | Status: AC
  Filled 2021-10-16: qty 5

## 2021-10-16 MED ORDER — NEOMYCIN-POLYMYXIN B GU 40-200000 IR SOLN
Status: AC
  Filled 2021-10-16: qty 4

## 2021-10-16 MED ORDER — PROCHLORPERAZINE MALEATE 10 MG PO TABS
10.0000 mg | ORAL_TABLET | Freq: Four times a day (QID) | ORAL | Status: DC | PRN
  Filled 2021-10-16: qty 1

## 2021-10-16 MED ORDER — ATORVASTATIN CALCIUM 20 MG PO TABS
40.0000 mg | ORAL_TABLET | Freq: Every day | ORAL | Status: DC
  Administered 2021-10-16 – 2021-10-19 (×4): 40 mg via ORAL
  Filled 2021-10-16 (×4): qty 2

## 2021-10-16 MED ORDER — LORAZEPAM 0.5 MG PO TABS: 0.5000 mg | ORAL_TABLET | Freq: Four times a day (QID) | ORAL | Status: DC | PRN

## 2021-10-16 MED ORDER — LIDOCAINE 5 % EX PTCH
1.0000 | MEDICATED_PATCH | CUTANEOUS | Status: DC
  Administered 2021-10-16 – 2021-10-20 (×4): 1 via TRANSDERMAL
  Filled 2021-10-16 (×5): qty 1

## 2021-10-16 MED ORDER — SODIUM CHLORIDE 0.9 % IR SOLN
Status: DC | PRN
  Administered 2021-10-16: 1000 mL
  Administered 2021-10-16: 250 mL
  Administered 2021-10-16: 3000 mL

## 2021-10-16 MED ORDER — FUROSEMIDE 40 MG PO TABS: 40.0000 mg | ORAL_TABLET | Freq: Every day | ORAL | Status: DC | PRN

## 2021-10-16 MED ORDER — UMECLIDINIUM BROMIDE 62.5 MCG/ACT IN AEPB
1.0000 | INHALATION_SPRAY | Freq: Every day | RESPIRATORY_TRACT | Status: DC
  Administered 2021-10-17 – 2021-10-20 (×4): 1 via RESPIRATORY_TRACT
  Filled 2021-10-16: qty 7

## 2021-10-16 MED ORDER — CARVEDILOL 12.5 MG PO TABS
12.5000 mg | ORAL_TABLET | Freq: Two times a day (BID) | ORAL | Status: DC
  Administered 2021-10-16 – 2021-10-20 (×9): 12.5 mg via ORAL
  Filled 2021-10-16 (×9): qty 1

## 2021-10-16 MED ORDER — ACETAMINOPHEN 10 MG/ML IV SOLN
INTRAVENOUS | Status: AC
  Filled 2021-10-16: qty 100

## 2021-10-16 MED ORDER — PHENYLEPHRINE HCL (PRESSORS) 10 MG/ML IV SOLN
INTRAVENOUS | Status: DC | PRN
Start: 2021-10-16 — End: 2021-10-16
  Administered 2021-10-16: 120 ug via INTRAVENOUS
  Administered 2021-10-16: 10 ug via INTRAVENOUS

## 2021-10-16 MED ORDER — NEOMYCIN-POLYMYXIN B GU 40-200000 IR SOLN: Status: DC | PRN

## 2021-10-16 MED ORDER — ASPIRIN 81 MG PO CHEW
81.0000 mg | CHEWABLE_TABLET | Freq: Every day | ORAL | Status: DC
  Administered 2021-10-17 – 2021-10-20 (×4): 81 mg via ORAL
  Filled 2021-10-16 (×4): qty 1

## 2021-10-16 MED ORDER — DOCUSATE SODIUM 100 MG PO CAPS
100.0000 mg | ORAL_CAPSULE | Freq: Two times a day (BID) | ORAL | Status: DC
  Administered 2021-10-16 – 2021-10-20 (×7): 100 mg via ORAL
  Filled 2021-10-16 (×8): qty 1

## 2021-10-16 MED ORDER — VANCOMYCIN HCL 1000 MG IV SOLR
INTRAVENOUS | Status: DC | PRN
  Administered 2021-10-16: 1000 mg

## 2021-10-16 MED ORDER — HYDROMORPHONE HCL 2 MG PO TABS
2.0000 mg | ORAL_TABLET | ORAL | Status: DC | PRN
  Administered 2021-10-17: 3 mg via ORAL
  Administered 2021-10-19: 2 mg via ORAL
  Filled 2021-10-16: qty 2
  Filled 2021-10-16: qty 1

## 2021-10-16 MED ORDER — POLYETHYLENE GLYCOL 3350 17 G PO PACK
17.0000 g | PACK | Freq: Every day | ORAL | Status: DC
  Administered 2021-10-19 – 2021-10-20 (×2): 17 g via ORAL
  Filled 2021-10-16 (×4): qty 1

## 2021-10-16 MED ORDER — ACETAMINOPHEN 10 MG/ML IV SOLN
INTRAVENOUS | Status: DC | PRN
  Administered 2021-10-16: 1000 mg via INTRAVENOUS

## 2021-10-16 MED ORDER — LIDOCAINE-PRILOCAINE 2.5-2.5 % EX CREA
TOPICAL_CREAM | Freq: Every day | CUTANEOUS | Status: DC | PRN
  Filled 2021-10-16: qty 5

## 2021-10-16 MED ORDER — MOMETASONE FURO-FORMOTEROL FUM 200-5 MCG/ACT IN AERO
2.0000 | INHALATION_SPRAY | Freq: Two times a day (BID) | RESPIRATORY_TRACT | Status: DC
  Administered 2021-10-16 – 2021-10-20 (×8): 2 via RESPIRATORY_TRACT
  Filled 2021-10-16: qty 8.8

## 2021-10-16 MED ORDER — FENTANYL CITRATE (PF) 100 MCG/2ML IJ SOLN
INTRAMUSCULAR | Status: DC | PRN
  Administered 2021-10-16 (×4): 25 ug via INTRAVENOUS

## 2021-10-16 MED ORDER — CHLORHEXIDINE GLUCONATE 0.12 % MT SOLN
OROMUCOSAL | Status: AC
Start: 2021-10-16 — End: 2021-10-16
  Administered 2021-10-16: 15 mL via OROMUCOSAL
  Filled 2021-10-16: qty 15

## 2021-10-16 MED ORDER — LACTATED RINGERS IV SOLN: INTRAVENOUS | Status: DC

## 2021-10-16 MED ORDER — METOCLOPRAMIDE HCL 5 MG/ML IJ SOLN: 5.0000 mg | Freq: Three times a day (TID) | INTRAMUSCULAR | Status: DC | PRN

## 2021-10-16 MED ORDER — FENTANYL CITRATE (PF) 100 MCG/2ML IJ SOLN
INTRAMUSCULAR | Status: AC
  Filled 2021-10-16: qty 2

## 2021-10-16 MED ORDER — VANCOMYCIN HCL 1750 MG/350ML IV SOLN
1750.0000 mg | Freq: Once | INTRAVENOUS | Status: AC
  Administered 2021-10-16: 1750 mg via INTRAVENOUS
  Filled 2021-10-16: qty 350

## 2021-10-16 MED ORDER — BISACODYL 5 MG PO TBEC: 5.0000 mg | DELAYED_RELEASE_TABLET | Freq: Every day | ORAL | Status: DC | PRN

## 2021-10-16 MED ORDER — VANCOMYCIN HCL 1750 MG/350ML IV SOLN
1750.0000 mg | INTRAVENOUS | Status: DC
  Administered 2021-10-17 – 2021-10-18 (×2): 1750 mg via INTRAVENOUS
  Filled 2021-10-16 (×3): qty 350

## 2021-10-16 MED ORDER — OXYCODONE HCL ER 10 MG PO T12A
10.0000 mg | EXTENDED_RELEASE_TABLET | Freq: Two times a day (BID) | ORAL | Status: DC
  Administered 2021-10-16 – 2021-10-20 (×8): 10 mg via ORAL
  Filled 2021-10-16 (×8): qty 1

## 2021-10-16 MED ORDER — PROPOFOL 10 MG/ML IV BOLUS
INTRAVENOUS | Status: AC
  Filled 2021-10-16: qty 20

## 2021-10-16 MED ORDER — ACETAMINOPHEN 500 MG PO TABS
1000.0000 mg | ORAL_TABLET | Freq: Four times a day (QID) | ORAL | Status: AC
  Administered 2021-10-16 – 2021-10-17 (×3): 1000 mg via ORAL
  Filled 2021-10-16 (×4): qty 2

## 2021-10-16 MED ORDER — SURGIPHOR WOUND IRRIGATION SYSTEM - OPTIME
TOPICAL | Status: DC | PRN
Start: 2021-10-16 — End: 2021-10-16

## 2021-10-16 MED ORDER — FLEET ENEMA 7-19 GM/118ML RE ENEM: 1.0000 | ENEMA | Freq: Once | RECTAL | Status: DC | PRN

## 2021-10-16 MED ORDER — DIPHENHYDRAMINE HCL 12.5 MG/5ML PO ELIX: 12.5000 mg | ORAL_SOLUTION | ORAL | Status: DC | PRN

## 2021-10-16 MED ORDER — PHENYLEPHRINE 40 MCG/ML (10ML) SYRINGE FOR IV PUSH (FOR BLOOD PRESSURE SUPPORT)
PREFILLED_SYRINGE | INTRAVENOUS | Status: AC
  Filled 2021-10-16: qty 10

## 2021-10-16 MED ORDER — ORAL CARE MOUTH RINSE: 15.0000 mL | Freq: Once | OROMUCOSAL | Status: AC

## 2021-10-16 MED ORDER — ONDANSETRON HCL 4 MG/2ML IJ SOLN
INTRAMUSCULAR | Status: DC | PRN
  Administered 2021-10-16: 4 mg via INTRAVENOUS

## 2021-10-16 MED ORDER — OXYCODONE HCL 5 MG PO TABS
10.0000 mg | ORAL_TABLET | ORAL | Status: DC | PRN
  Administered 2021-10-18 – 2021-10-19 (×2): 15 mg via ORAL
  Filled 2021-10-16 (×2): qty 3

## 2021-10-16 MED ORDER — SENNOSIDES-DOCUSATE SODIUM 8.6-50 MG PO TABS: 1.0000 | ORAL_TABLET | Freq: Every evening | ORAL | Status: DC | PRN

## 2021-10-16 MED ORDER — SODIUM CHLORIDE 0.9 % IV SOLN: INTRAVENOUS | Status: DC

## 2021-10-16 SURGICAL SUPPLY — 69 items
APL PRP STRL LF DISP 70% ISPRP (MISCELLANEOUS) ×1
BLADE SAGITTAL AGGR TOOTH XLG (BLADE) ×1 IMPLANT
BNDG COHESIVE 6X5 TAN ST LF (GAUZE/BANDAGES/DRESSINGS) ×4 IMPLANT
CANISTER WOUND CARE 500ML ATS (WOUND CARE) ×2 IMPLANT
CHLORAPREP W/TINT 26 (MISCELLANEOUS) ×2 IMPLANT
CNTNR SPEC 2.5X3XGRAD LEK (MISCELLANEOUS) ×2
CONT SPEC 4OZ STER OR WHT (MISCELLANEOUS) ×2
CONT SPEC 4OZ STRL OR WHT (MISCELLANEOUS) ×2
CONTAINER SPEC 2.5X3XGRAD LEK (MISCELLANEOUS) IMPLANT
COVER BACK TABLE REUSABLE LG (DRAPES) ×1 IMPLANT
DRAPE 3/4 80X56 (DRAPES) ×4 IMPLANT
DRAPE C-ARM XRAY 36X54 (DRAPES) ×1 IMPLANT
DRAPE INCISE IOBAN 66X60 STRL (DRAPES) IMPLANT
DRAPE POUCH INSTRU U-SHP 10X18 (DRAPES) ×2 IMPLANT
DRAPE STERI IOBAN 125X83 (DRAPES) ×1 IMPLANT
DRESSING SURGICEL FIBRLLR 1X2 (HEMOSTASIS) ×2 IMPLANT
DRSG MEPILEX SACRM 8.7X9.8 (GAUZE/BANDAGES/DRESSINGS) ×1 IMPLANT
DRSG SURGICEL FIBRILLAR 1X2 (HEMOSTASIS)
ELECT BLADE 6.5 EXT (BLADE) ×2 IMPLANT
ELECT REM PT RETURN 9FT ADLT (ELECTROSURGICAL) ×2
ELECTRODE REM PT RTRN 9FT ADLT (ELECTROSURGICAL) ×1 IMPLANT
GLOVE SURG SYN 9.0  PF PI (GLOVE) ×2
GLOVE SURG SYN 9.0 PF PI (GLOVE) ×2 IMPLANT
GLOVE SURG UNDER POLY LF SZ9 (GLOVE) ×2 IMPLANT
GOWN SRG 2XL LVL 4 RGLN SLV (GOWNS) ×1 IMPLANT
GOWN STRL NON-REIN 2XL LVL4 (GOWNS) ×2
GOWN STRL REUS W/ TWL LRG LVL3 (GOWN DISPOSABLE) ×1 IMPLANT
GOWN STRL REUS W/TWL LRG LVL3 (GOWN DISPOSABLE) ×2
HANDPIECE VERSAJET DEBRIDEMENT (MISCELLANEOUS) ×1 IMPLANT
HOLDER FOLEY CATH W/STRAP (MISCELLANEOUS) ×1 IMPLANT
HOOD PEEL AWAY FLYTE STAYCOOL (MISCELLANEOUS) ×3 IMPLANT
IV SODIUM CHL 0.9% 250ML (IV SOLUTION) ×1 IMPLANT
KIT PREVENA INCISION MGT 13 (CANNISTER) ×3 IMPLANT
KIT STIMULAN RAPID CURE 5CC (Orthopedic Implant) ×1 IMPLANT
MANIFOLD NEPTUNE II (INSTRUMENTS) ×2 IMPLANT
MAT ABSORB  FLUID 56X50 GRAY (MISCELLANEOUS) ×1
MAT ABSORB FLUID 56X50 GRAY (MISCELLANEOUS) ×1 IMPLANT
NDL SPNL 20GX3.5 QUINCKE YW (NEEDLE) ×2 IMPLANT
NEEDLE SPNL 20GX3.5 QUINCKE YW (NEEDLE) IMPLANT
NS IRRIG 1000ML POUR BTL (IV SOLUTION) ×2 IMPLANT
PACK HIP COMPR (MISCELLANEOUS) ×2 IMPLANT
PENCIL ELECTRO HAND CTR (MISCELLANEOUS) ×1 IMPLANT
PULSAVAC PLUS IRRIG FAN TIP (DISPOSABLE) ×2
SCALPEL PROTECTED #10 DISP (BLADE) ×4 IMPLANT
SOL .9 NS 3000ML IRR  AL (IV SOLUTION) ×1
SOL .9 NS 3000ML IRR AL (IV SOLUTION) ×1
SOL .9 NS 3000ML IRR UROMATIC (IV SOLUTION) IMPLANT
SOLUTION IRRIG SURGIPHOR (IV SOLUTION) ×1 IMPLANT
STAPLER SKIN PROX 35W (STAPLE) ×2 IMPLANT
STRAP SAFETY 5IN WIDE (MISCELLANEOUS) ×2 IMPLANT
SUT DVC 2 QUILL PDO  T11 36X36 (SUTURE)
SUT DVC 2 QUILL PDO T11 36X36 (SUTURE) ×1 IMPLANT
SUT SILK 0 (SUTURE)
SUT SILK 0 30XBRD TIE 6 (SUTURE) ×1 IMPLANT
SUT V-LOC 90 ABS 3-0 VLT  V-20 (SUTURE) ×1
SUT V-LOC 90 ABS 3-0 VLT V-20 (SUTURE) IMPLANT
SUT V-LOC 90 ABS DVC 3-0 CL (SUTURE) ×1 IMPLANT
SUT VIC AB 1 CT1 36 (SUTURE) ×1 IMPLANT
SWAB CULTURE AMIES ANAERIB BLU (MISCELLANEOUS) ×2 IMPLANT
SYR 20ML LL LF (SYRINGE) ×1 IMPLANT
SYR 30ML LL (SYRINGE) ×1 IMPLANT
SYR 50ML LL SCALE MARK (SYRINGE) ×2 IMPLANT
SYR BULB IRRIG 60ML STRL (SYRINGE) ×2 IMPLANT
TAPE MICROFOAM 4IN (TAPE) ×1 IMPLANT
TIP FAN IRRIG PULSAVAC PLUS (DISPOSABLE) IMPLANT
TOWEL OR 17X26 4PK STRL BLUE (TOWEL DISPOSABLE) IMPLANT
TRAY FOLEY MTR SLVR 16FR STAT (SET/KITS/TRAYS/PACK) ×1 IMPLANT
WAND WEREWOLF FASTSEAL 6.0 (MISCELLANEOUS) ×1 IMPLANT
WATER STERILE IRR 1000ML POUR (IV SOLUTION) ×1 IMPLANT

## 2021-10-16 NOTE — Progress Notes (Signed)
Pharmacy Antibiotic Note ? ?Richard Wiggins is a 71 y.o. male admitted on 10/16/2021 with  prosthetic hip infection .  Pharmacy has been consulted for vancomycin and cefepime dosing. Patient hip replacement in 2008.  Worsening hip pain w/ CT 4/4 revealing large hip effusion.  He is on chemotherapy for lung cancer.  He underwent I&D 4/7 (per notes, not candidate for two-stage revision due to cancer/chemo).  Operation report notes purulent synovial fluid.  Pharmacy asked to dose vancomycin and cefepime pending OR cultures.  A[[ears cefazolin given in OR after cultures taken. Vancomycin 1gm IVPB ordered but no documentation that it was given (vancomycin powder used for Stimulan beads) ? ?Today, 10/16/2021 ?Renal: Scr 1.09 on 4/4 ?WBC 4.2  ?Afebrile ?4/7 OR cultures pending ? ?Plan: ?Vancomycin 1750mg  IV q24h.  For calculated AUC 509 using SCr 1.09 ?Goal AUC 400-600 ?Cefepime 2gm IV q8h ?Monitor renal function  ?Check trough Monday ?Follow-up likely OPAT ? ?Height: 5\' 10"  (177.8 cm) ?Weight: 82.8 kg (182 lb 8 oz) ?IBW/kg (Calculated) : 73 ? ?Temp (24hrs), Avg:97.8 ?F (36.6 ?C), Min:97.2 ?F (36.2 ?C), Max:98.7 ?F (37.1 ?C) ? ?Recent Labs  ?Lab 10/11/21 ?1945 10/13/21 ?2128 10/13/21 ?2327  ?WBC 3.9* 4.2  --   ?CREATININE 1.16 1.09  --   ?LATICACIDVEN 0.7 0.9 0.8  ?  ?Estimated Creatinine Clearance: 64.2 mL/min (by C-G formula based on SCr of 1.09 mg/dL).   ? ?Allergies  ?Allergen Reactions  ? Tape Rash  ?  plastic  ? Wound Dressing Adhesive Itching and Rash  ?  Gets stuck to skin, makes wound spread  ? ? ?Antimicrobials this admission: ?Vancomycin 4/7 >> ?Cefepime 4/7 >> ? ?Dose adjustments this admission: ? ?Microbiology results: ?4/7 OR hip cx: ? ?Thank you for allowing pharmacy to be a part of this patient?s care. ? ?Doreene Eland, PharmD, BCPS, BCIDP ?Work Cell: [phone] ?10/16/2021 4:26 PM ? ? ? ?

## 2021-10-16 NOTE — Transfer of Care (Signed)
Immediate Anesthesia Transfer of Care Note ? ?Patient: Richard Wiggins ? ?Procedure(s) Performed: IRRIGATION AND DEBRIDEMENT OF INFECTED TOTAL HIP (Right: Hip) ? ?Patient Location: PACU ? ?Anesthesia Type:General ? ?Level of Consciousness: awake and drowsy ? ?Airway & Oxygen Therapy: Patient Spontanous Breathing and Patient connected to face mask oxygen ? ?Post-op Assessment: Report given to RN and Post -op Vital signs reviewed and stable ? ?Post vital signs: Reviewed and stable ? ?Last Vitals:  ?Vitals Value Taken Time  ?BP 146/79 10/16/21 1356  ?Temp    ?Pulse 73 10/16/21 1401  ?Resp 17 10/16/21 1401  ?SpO2 100 % 10/16/21 1401  ?Vitals shown include unvalidated device data. ? ?Last Pain:  ?Vitals:  ? 10/16/21 1026  ?TempSrc: Temporal  ?PainSc: 5   ?   ? ?Patients Stated Pain Goal: 0 (10/16/21 1026) ? ?Complications: No notable events documented. ?

## 2021-10-16 NOTE — Anesthesia Postprocedure Evaluation (Signed)
Anesthesia Post Note ? ?Patient: Richard Wiggins ? ?Procedure(s) Performed: IRRIGATION AND DEBRIDEMENT OF INFECTED TOTAL HIP (Right: Hip) ? ?Patient location during evaluation: PACU ?Anesthesia Type: General ?Level of consciousness: awake and alert ?Pain management: pain level controlled ?Vital Signs Assessment: post-procedure vital signs reviewed and stable ?Respiratory status: spontaneous breathing, nonlabored ventilation, respiratory function stable and patient connected to nasal cannula oxygen ?Cardiovascular status: blood pressure returned to baseline and stable ?Postop Assessment: no apparent nausea or vomiting ?Anesthetic complications: no ? ? ?No notable events documented. ? ? ?Last Vitals:  ?Vitals:  ? 10/16/21 1445 10/16/21 1500  ?BP: 116/72 118/68  ?Pulse: 76 73  ?Resp: (!) 27 20  ?Temp:  37.1 ?C  ?SpO2: 92% 93%  ?  ?Last Pain:  ?Vitals:  ? 10/16/21 1500  ?TempSrc:   ?PainSc: 0-No pain  ? ? ?  ?  ?  ?  ?  ?  ? ?Martha Clan ? ? ? ? ?

## 2021-10-16 NOTE — Consult Note (Signed)
NAME: Richard Wiggins  ?DOB: 08-19-1950  ?MRN: [id number]  ?Date/Time: 10/16/2021 10:43 AM ? ?REQUESTING PROVIDER: Dr.Menz ?  ?REASON FOR CONSULT: Prosthetic joint infeciton ?? ?Richard Wiggins is a 71 y.o. male with a history of hypertension, aneurysm brain status post clipping,  COPD, coronary artery disease status post stent, ? hyperlipidemia, non-small cell CA lung on chemo, anemia status post transfusions, paroxysmal atrial fibrillation, CKD ,peripheral right hip arthroplasty is admitted for right hip prosthetic joint infection and for washout and poly exchange by Dr. Rudene Christians. ?Patient was in the ED on 10/11/2021 with fever and cough and also falling out of bed.[name]  He underwent CT of the chest abdomen and pelvis and incidentally was noted to have a new large right hip effusion continues with a iliopsoas bursitis.  There was gas in the iliopsoas bursa which is probably and continued with the hip joint raising concern for a septic joint.  He was referred to Ortho and saw Dr. Rudene Christians on 10/14/2021 when the hip joint was aspirated. ?The cell count was more than 80,000 with 90% neutrophils.  Culture was sent.  Patient was brought in today to undergo washout and replacement of the poly liner.  I am asked to see the patient for antibiotic management. ?Patient has been getting chemotherapy for non-small cell CA lung.  His last one was on March 1.  He was hospitalized 3/15 to 09/24/2021 with new onset nosebleed.  His hemoglobin was 6.9 and he received blood transfusion.  His WBC and platelet also was trending down compatible with bone marrow suppression from chemotherapy.  Wife states the chemotherapy had made him feel very weak.  He also had severe nausea and vomiting and cough and developed groin and had a CT abdomen and pelvis on 09/18/2021 which showed a 4.7 cm fusiform infrarenal abdominal aortic aneurysm and a small fat-containing right inguinal hernia.  The musculoskeletal assessment of the right hip did not show any effusion  on that film. ? ? ? ?Past Medical History:  ?Diagnosis Date  ? AAA (abdominal aortic aneurysm) ([place]) 2020  ? a. 2020 U/S: 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 -plan medical managmment with tight bp control; b. 09/2020 Abd U/S: 4.1cm.  ? Anxiety   ? Aortic atherosclerosis ([place])   ? Benign essential HTN 02/10/2020  ? BPH (benign prostatic hyperplasia) 02/09/2020  ? Brain aneurysm   ? Cancer of upper lobe of left lung ([place]) 04/2020  ? a.) ENB/EBUS performed; Bx non-diagnostic. b.) presumed stage I NSCLC in the LUL; underwent SBRT (60 Gy over 5 fractions)  ? Carotid arterial disease ([place])   ? a. 05/2017 s/p R carotid endarterectomy following CVA.  ? 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 a. 2018 Echo: EF 30-35%; b. 02/2021 Echo: EF 55-60%, nl RV fxn. Triv MR. Asc Ao 58mm.  ? CKD (chronic kidney disease), stage III ([place])   ? Claudication [facility])   ? a. 09/2020 ABIs: R 0.91. L 0.99.  ? COPD (chronic obstructive pulmonary disease) ([place])   ? Coronary artery disease   ? a. 06/2017 PCI ([place]): s/p PCI to the RCA. LAD 50, LCX 20.  ? Depression   ? 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 a. 02/2021 Asc Ao 95mm.  ? Emphysema of lung ([place])   ? History of blood transfusion   ? HLD (hyperlipidemia)   ? IDA (iron deficiency anemia) 02/09/2020  ? 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  ? Inguinal hernia   ?  right  ? Ischemic cardiomyopathy   ? Long term current use of anticoagulant   ? a.) apixaban  ? Marijuana use, continuous 02/10/2020  ? Nosebleed   ? Osteoporosis   ? PAF (paroxysmal atrial fibrillation) ([place])   ? a. 05/2017 Dx in setting of CVA. CHA2DS2VASc = 6-->Eliquis.  ? Pre-diabetes   ? Pulmonary emboli ([place]) 06/07/2017  ? Right lower lobe pulmonary embolism small segmental, multifocal multifocal pneumonia, mediastinal lymphadenopathy, moderate centrilobular emphysema  ? Seizures ([place])   ? Sigmoid diverticulosis   ? a.) CT 01/27/2021: severe  ? Stroke [facility])   ? a. 05/2017 - [place]prolonged hospitalization in setting of R CEA, PE, and finding of RCA dzs on cath; b. Residual left sided weakness to arm and leg.  ?  ?Past Surgical History:  ?Procedure Laterality Date  ? brain aneurysm with clip    ? COLONOSCOPY WITH PROPOFOL N/A 07/23/2020  ? Procedure: COLONOSCOPY WITH PROPOFOL;  Surgeon: Lin Landsman, MD;  Location: [facility] ENDOSCOPY;  Service: Gastroenterology;  Laterality: N/A;  ? CORONARY ANGIOPLASTY WITH STENT PLACEMENT  07/08/2017  ? PORTA CATH INSERTION N/A 08/31/2021  ? Procedure: PORTA CATH INSERTION;  Surgeon: Algernon Huxley, MD;  Location: [facility] CV LAB;  Service: Cardiovascular;  Laterality: N/A;  ? TOTAL HIP ARTHROPLASTY Right   ? VIDEO BRONCHOSCOPY WITH ENDOBRONCHIAL NAVIGATION N/A 04/23/2020  ? Procedure: VIDEO BRONCHOSCOPY WITH ENDOBRONCHIAL NAVIGATION;  Surgeon: Tyler Pita, MD;  Location: [facility] ORS;  Service: Pulmonary;  Laterality: N/A;  ? VIDEO BRONCHOSCOPY WITH ENDOBRONCHIAL ULTRASOUND Left 08/12/2021  ? Procedure: VIDEO BRONCHOSCOPY WITH ENDOBRONCHIAL ULTRASOUND;  Surgeon: Tyler Pita, MD;  Location: [facility] ORS;  Service: Cardiopulmonary;  Laterality: Left;  ?  ?Social History  ? ?Socioeconomic History  ? Marital status: Married  ?  Spouse name: Butch Penny  ? Number of children: Not on file  ? Years of education: Not on file  ? Highest education level: High school graduate  ?Occupational History  ? Occupation: Retired  ?Tobacco Use  ? Smoking status: Former  ?  Packs/day: 2.00  ?  Years: 53.00  ?  Pack years: 106.00  ?  Types: Cigarettes  ?  Quit date: 05/28/2017  ?  Years since quitting: 4.3  ? Smokeless tobacco: Never  ? Tobacco comments:  ?  Quit in 2018  ?Vaping Use  ? Vaping Use: Never used  ?Substance and Sexual Activity  ? Alcohol use: Yes  ?  Comment: socially drink cocktail  ? Drug use: Not Currently  ?  Types: Marijuana  ?  Comment: last smoke x1 month ago  ? Sexual activity: Not Currently  ?Other Topics Concern  ? Not on file  ?Social History  Narrative  ? Lives with wife. 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 Children x2 children and grandchildren grown.   ? ?Social Determinants of Health  ? ?Financial Resource Strain: Low Risk   ? Difficulty of Paying Living Expenses: Not hard at all  ?Food Insecurity: No Food Insecurity  ? Worried About Charity fundraiser in the Last Year: Never true  ? Ran Out of Food in the Last Year: Never true  ?Transportation Needs: No Transportation Needs  ? Lack of Transportation (Medical): No  ? Lack of Transportation (Non-Medical): No  ?Physical Activity: Inactive  ? Days of Exercise per Week: 0 days  ? Minutes of Exercise per Session: 0 min  ?Stress: Stress Concern Present  ? Feeling of Stress : Rather much  ?Social  Connections: Moderately Isolated  ? Frequency of Communication with Friends and Family: Never  ? Frequency of Social Gatherings with Friends and Family: Never  ? Attends Religious Services: Never  ? Active Member of Clubs or Organizations: Yes  ? Attends Archivist Meetings: Never  ? Marital Status: Married  ?Intimate Partner Violence: Not At Risk  ? Fear of Current or Ex-Partner: No  ? Emotionally Abused: No  ? Physically Abused: No  ? Sexually Abused: No  ?  ?Family History  ?Problem Relation Age of Onset  ? Diabetes Mother   ? Heart disease Mother   ? Stroke Father   ? Heart disease Father   ? Heart attack Father   ? Alcohol abuse Father   ? Heart disease Sister   ? Heart attack Sister   ? Heart disease Maternal Grandmother   ? Heart attack Maternal Grandmother   ? Heart attack Paternal Grandmother   ? Diabetes Brother   ? Heart disease Brother   ? Heart attack Brother   ? ?Allergies  ?Allergen Reactions  ? Tape Rash  ?  plastic  ? Wound Dressing Adhesive Itching and Rash  ?  Gets stuck to skin, makes wound spread  ? ?I? ?Current Facility-Administered Medications  ?Medication Dose Route Frequency Provider Last Rate Last Admin  ? ceFAZolin (ANCEF) 2-4 GM/100ML-% IVPB           ? ceFAZolin  (ANCEF) IVPB 2g/100 mL premix  2 g Intravenous On Call to OR Hessie Knows, MD      ? chlorhexidine (PERIDEX) 0.12 % solution 15 mL  15 mL Mouth/Throat Once Darrin Nipper, MD      ? Or  ? MEDLINE mouth ri

## 2021-10-16 NOTE — Op Note (Signed)
10/16/2021 ? ?2:00 PM ? ?PATIENT:  Richard Wiggins  71 y.o. male ? ?PRE-OPERATIVE DIAGNOSIS:  Status post right hip replacement  Z96.641 ?Infection of prosthetic total hip joint, initial encounter  T84.59XA ? ?POST-OPERATIVE DIAGNOSIS:  INFECTED RIGHT TOTAL HIP ? ?PROCEDURE:  Procedure(s): ?IRRIGATION AND DEBRIDEMENT OF INFECTED TOTAL HIP (Right) ? ?SURGEON: Laurene Footman, MD ? ?ASSISTANTS: None ? ?ANESTHESIA:   general ? ?EBL:  Total I/O ?In: 600 [I.V.:500; IV Piggyback:100] ?Out: -  ? ?BLOOD ADMINISTERED:none ? ?DRAINS:  Incisional wound VAC   ? ?LOCAL MEDICATIONS USED:  NONE ? ?SPECIMEN:  Excision ? ?DISPOSITION OF SPECIMEN:   Multiple cultures tissue and synovial fluid ? ?COUNTS:  YES ? ?TOURNIQUET:  * No tourniquets in log * ? ?IMPLANTS: Absorbable stimulan beads with vancomycin ? ?DICTATION: .Dragon Dictation patient was brought to the operating room and after adequate general anesthesia was obtained he was placed onto the operative table with the Medacta attachment right foot in the [place] attachment left leg on a well-padded table.  After prepping and draping in the usual sterile fashion appropriate patient identification timeout procedures were completed.  Direct anterior approach was utilized center of the tensor muscle with the skin and subcutaneous tissue incised tensor fascia incised and the tensor muscle retracted laterally and deep retractor placed.  Anterior circumflex vessels were then cauterized.  The anterior capsule was exposed and with the leg externally rotated this was opened and purulent synovial fluid was identified and cultured x2.  When a flap was created and the hip joint opened further deep retractor was placed and synovium was also debrided with use of a rondure for additional tissue culture x2.  The debridement was then carried out using a rondure as well as sharp dissection using scalpel and cautery.  After initial debridement of the grossly infected tissue the hip was irrigated and  then with some traction applied to open up the joint space versa jet was used to clean off the metal surfaces as best as I could.  The plastic surfaces which were accessible were also cleaned with the Versajet at a lower level of pressure.  When this had been completed Betadine was placed into the joint and soaked for 10 minutes following this that was irrigated out with pulsatile lavage and when the pulsatile lavage was completed stimulant beads were placed and the deep fascia repaired using a heavy Quill suture 3 oh V-Loc subcutaneously and skin staples followed by incisional wound VAC. ? ?PLAN OF CARE: Admit to inpatient  ? ?PATIENT DISPOSITION:  PACU - hemodynamically stable. ?  ? ?

## 2021-10-16 NOTE — Anesthesia Preprocedure Evaluation (Signed)
Anesthesia Evaluation  ?Patient identified by MRN, date of birth, ID band ?Patient awake ? ? ? ?Reviewed: ?Allergy & Precautions, H&P , NPO status , Patient's Chart, lab work & pertinent test results, reviewed documented beta blocker date and time  ? ?History of Anesthesia Complications ?Negative for: history of anesthetic complications ? ?Airway ?Mallampati: III ? ?TM Distance: >3 FB ?Neck ROM: full ? ? ? Dental ? ?(+) Upper Dentures, Lower Dentures, Dental Advidsory Given ?  ?Pulmonary ?shortness of breath and with exertion, neg sleep apnea, COPD, neg recent URI, former smoker,  ?  ?Pulmonary exam normal ? ? ? ? ? ? ? Cardiovascular ?Exercise Tolerance: Poor ?hypertension, On Medications ?(-) angina+ CAD, + Cardiac Stents, + Peripheral Vascular Disease, +CHF and + DOE  ?(-) Past MI and (-) Orthopnea Normal cardiovascular exam(-) dysrhythmias (-) Valvular Problems/Murmurs ?Rhythm:regular Rate:Normal ? ? ?  ?Neuro/Psych ?Seizures -, Well Controlled,  PSYCHIATRIC DISORDERS Anxiety Depression CVA, Residual Symptoms   ? GI/Hepatic ?negative GI ROS, Neg liver ROS,   ?Endo/Other  ?negative endocrine ROS ? Renal/GU ?Renal disease  ?negative genitourinary ?  ?Musculoskeletal ? ? Abdominal ?  ?Peds ? Hematology ? ?(+) Blood dyscrasia, anemia ,   ?Anesthesia Other Findings ?Past Medical History: ?2020: AAA (abdominal aortic aneurysm) ?    Comment:  a. 2020 U/S: 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 -plan medical  ?             managmment with tight bp control; b. 09/2020 Abd U/S:  ?             4.1cm. ?No date: Anxiety ?No date: Aortic atherosclerosis ([place]) ?02/10/2020: Benign essential HTN ?02/09/2020: BPH (benign prostatic hyperplasia) ?No date: Brain aneurysm ?04/2020: Cancer of upper lobe of left lung ([place]) ?    Comment:  a.) ENB/EBUS performed; Bx non-diagnostic. b.) presumed  ?             stage I NSCLC in the LUL; underwent SBRT (60 Gy over 5  ?             fractions) ?No date: Carotid arterial  disease ([place]) ?    Comment:  a. 05/2017 s/p R carotid endarterectomy following CVA. ?No date: Chronic HFimpEF (heart failure with improved ejection  ?fraction) ([place]) ?    Comment:  a. 2018 Echo: EF 30-35%; b. 02/2021 Echo: EF 55-60%, nl  ?             RV fxn. Triv MR. Asc Ao 76mm. ?No date: CKD (chronic kidney disease), stage III ([place]) ?No date: Claudication [facility]) ?    Comment:  a. 09/2020 ABIs: R 0.91. L 0.99. ?No date: COPD (chronic obstructive pulmonary disease) ([place]) ?No date: Coronary artery disease ?    Comment:  a. 06/2017 PCI ([place]): s/p PCI to the RCA. LAD 50, LCX 20. 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    Comment:  a. 02/2021 Asc Ao 67mm. ?No date: Emphysema of lung ([place]) ?No date: HLD (hyperlipidemia) ?02/09/2020: IDA (iron deficiency anemia) ?02/10/2020: Indeterminate pulmonary nodules ?No date: Ischemic cardiomyopathy ?No date: Long term current use of anticoagulant ?    Comment:  a.) apixaban ?02/10/2020: Marijuana use, continuous ?No date: Osteoporosis ?No date: PAF (paroxysmal atrial fibrillation) ([place]) ?    Comment:  a. 05/2017 Dx in setting of CVA. CHA2DS2VASc =  ?             6-->Eliquis. ?No date: Pre-diabetes ?06/07/2017: Pulmonary emboli ([place]) ?  Comment:  Right lower lobe pulmonary embolism small segmental,  ?             multifocal multifocal pneumonia, mediastinal  ?             lymphadenopathy, moderate centrilobular emphysema ?No date: Seizures ([place]) ?No date: Sigmoid diverticulosis ?    Comment:  a.) CT 01/27/2021: severe ?No date: Stroke [facility]) ?    Comment:  a. 05/2017 - [place]prolonged  ?             hospitalization in setting of R CEA, PE, and finding of  ?             RCA dzs on cath; b. Residual left sided weakness to arm  ?             and leg. ?Past Surgical History: ?No date: brain aneurysm with clip ?07/23/2020: COLONOSCOPY WITH PROPOFOL; N/A ?    Comment:  Procedure: COLONOSCOPY WITH PROPOFOL;  Surgeon: Marius Ditch,  ?              Tally Due, MD;  Location: [facility] ENDOSCOPY;  Service:  ?             Gastroenterology;  Laterality: N/A; ?07/08/2017: CORONARY ANGIOPLASTY WITH STENT PLACEMENT ?No date: TOTAL HIP ARTHROPLASTY; Right ?04/23/2020: VIDEO BRONCHOSCOPY WITH ENDOBRONCHIAL NAVIGATION; N/A ?    Comment:  Procedure: VIDEO BRONCHOSCOPY WITH ENDOBRONCHIAL  ?             NAVIGATION;  Surgeon: Tyler Pita, MD;  Location:  ?             [facility] ORS;  Service: Pulmonary;  Laterality: N/A; ? ? Reproductive/Obstetrics ?negative OB ROS ? ?  ? ? ? ? ? ? ? ? ? ? ? ? ? ?  ?  ? ? ? ? ? ? ? ? ?Anesthesia Physical ? ?Anesthesia Plan ? ?ASA: 3 ? ?Anesthesia Plan: General  ? ?Post-op Pain Management:   ? ?Induction: Intravenous ? ?PONV Risk Score and Plan: 2 and Ondansetron, Dexamethasone, Treatment may vary due to age or medical condition and Midazolam ? ?Airway Management Planned: LMA ? ?Additional Equipment:  ? ?Intra-op Plan:  ? ?Post-operative Plan: Extubation in OR ? ?Informed Consent: I have reviewed the patients History and Physical, chart, labs and discussed the procedure including the risks, benefits and alternatives for the proposed anesthesia with the patient or authorized representative who has indicated his/her understanding and acceptance.  ? ? ? ?Dental Advisory Given ? ?Plan Discussed with: CRNA ? ?Anesthesia Plan Comments:   ? ? ? ? ? ? ?Anesthesia Quick Evaluation ? ?

## 2021-10-16 NOTE — H&P (Signed)
?Chief Complaint  ?Patient presents with  ? Right Hip - Pain  ? ? ?History of the Present Illness: ?Richard Wiggins is a 71 y.o. male here today.  ? 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 The patient has a history of prior right total hip arthroplasty performed in 02/2007 in [facility] in [place]. We do not have any record of that. He went to the emergency room last night and had x-rays and a CT that showed effusion. His sedimentation rate was 128. 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 He also has a complicated history of recurrent stage 3 lung cancer. I talked to Dr. Janese Wiggins, who states he is in frail health and would not be able to undergo 2-stage revision. I talked to Richard Wiggins infected total joint program, and they felt that if we could take care of him here with a polyethylene exchange and I and D that might work as well. ? ?The patient presents with a male companion who states on 09/20/2021, the patient had pain that kept him frozen in the bathroom, and he was diagnosed with a hernia. He locates his pain to the lateral aspect of his right hip. He experiences mild pain when he puts weight on his right hip. His right hip pain started a few days ago. ? ?The patient has thromboembolic disease. He has had a stroke in the past. He is currently on cancer treatment. He has peripheral vascular disease. ? ?I have reviewed past medical, surgical, social and family history, and allergies as documented in the EMR. ? ?Past Medical History: ?Past Medical History:  ?Diagnosis Date  ? AAA (abdominal aortic aneurysm) (CMS-HCC)  ? Anemia  ? Anxiety  ? Aortic atherosclerosis (CMS-HCC)  ? Atrial fibrillation (CMS-HCC)  ? Brain aneurysm  ? Chronic kidney disease  ? COPD (chronic obstructive pulmonary disease) (CMS-HCC)  ? Depression  ? Hemoptysis  ? Hiatal hernia  ? Hyperlipidemia  ? Hypertension  ? Lung cancer (CMS-HCC)  ? Osteoporosis   ? Seizures (CMS-HCC)  ? ?Past Surgical History: ?Past Surgical History:  ?Procedure Laterality Date  ? Brain Aneurysm  ?with clip  ? BRONCHOSCOPY  ? COLONOSCOPY  ? PERCUTANEOUS PORTAL VEIN CATHETERIZATION  ?Dr Richard Wiggins  ? REPLACEMENT TOTAL HIP W/ RESURFACING IMPLANTS Right  ? ?Past Family History: ?History reviewed. No pertinent family history. ? ?Medications: ?Current Outpatient Medications Ordered in Epic  ?Medication Sig Dispense Refill  ? albuterol 90 mcg/actuation inhaler Inhale into the lungs Inhale 2 puffs into the lungs every 4 (four) hours as needed.  ? apixaban (ELIQUIS) 5 mg tablet Take by mouth Take 5 mg by mouth in the morning and at bedtime  ? aspirin 81 MG EC tablet Take by mouth Take 81 mg by mouth daily. Swallow whole.  ? atorvastatin (LIPITOR) 40 MG tablet TAKE 1 TABLET(40 MG) BY MOUTH AT BEDTIME  ? budesonide-formoteroL (SYMBICORT) 160-4.5 mcg/actuation inhaler Inhale into the lungs Inhale 2 puffs into the lungs 2 (two) times daily.  ? carvediloL (COREG) 25 MG tablet Take by mouth Take 12.5 mg by mouth in the morning and at bedtime.  ? esomeprazole (NEXIUM) 40 mg packet Take 1 each by mouth once daily  ? FUROsemide (LASIX) 40 MG tablet Take 40 mg by mouth as needed  ? LORazepam (ATIVAN) 0.5 MG tablet Take 1 tablet by mouth every 6 (six) hours as needed  ? multivitamin tablet Take 1 tablet by mouth once daily  ? oxyCODONE (  ROXICODONE) 5 MG immediate release tablet Take 1 tablet by mouth every 8 (eight) hours as needed  ? sacubitriL-valsartan (ENTRESTO) 49-51 mg tablet Take by mouth Take 0.5 tablets by mouth in the morning and at bedtime  ? spironolactone (ALDACTONE) 25 MG tablet Take by mouth Take 12.5 mg by mouth at bedtime  ? sucralfate (CARAFATE) 1 gram tablet Take 1 tablet by mouth 3 (three) times daily as needed  ? tamsulosin (FLOMAX) 0.4 mg capsule TAKE 1 CAPSULE(0.4 MG) BY MOUTH DAILY  ? traMADoL (ULTRAM) 50 mg tablet Take 1 tablet by mouth every 6 (six) hours as needed  ? lamoTRIgine (LAMICTAL)  100 MG tablet Take 1 tablet (100 mg total) by mouth 2 (two) times daily 180 tablet 1  ? lamoTRIgine (LAMICTAL) 100 MG tablet Take 1 tablet (100 mg total) by mouth 2 (two) times daily for 90 days 180 tablet 3  ? ?No current Epic-ordered facility-administered medications on file.  ? ?Allergies: ?Allergies  ?Allergen Reactions  ? Adhesive Tape-Silicones Itching  ? ? ?Body mass index is 30.13 kg/m?. ? ?Review of Systems: ?A comprehensive 14 point ROS was performed, reviewed, and the pertinent orthopaedic findings are documented in the HPI. ? ?Vitals:  ?10/14/21 1256  ?BP: 136/74  ? ? ?General Physical Examination:  ? ?General/Constitutional: No apparent distress: well-nourished and well developed. ?Eyes: Pupils equal, round with synchronous movement. ?Lungs: Clear to auscultation ?HEENT: Normal ?Vascular: No edema, swelling or tenderness, except as noted in detailed exam. ?Cardiac: Heart rate and rhythm is regular. ?Integumentary: No impressive skin lesions present, except as noted in detailed exam. ?Neuro/Psych: Normal mood and affect, oriented to person, place and time. ? ?On exam, tenderness to the lateral aspect of the right hip. Edema to the right hip. ? ?Radiographs: ? ?No new imaging studies were obtained today. ? ?Assessment: ?ICD-10-CM  ?1. Status post right hip replacement Z96.641  ?2. Infection of prosthetic total hip joint, initial encounter (CMS-HCC) T84.59XA  ?W40.973  ? ?Plan: ? ?The patient has clinical findings of infected right total hip. ? ?We discussed the patient's prior x-ray findings. I explained I am concerned that he has an infection in his right hip. I recommended an ultrasound-guided aspiration with Rosalia Hammers, DO, MS. ? ?Hip aspirate returned cloudy fluid. We requested his operative note from 02/23/2007 from [facility] in [place] as soon as possible for identification of the implants that were used. If it is infected, we need to go in there and do debridement. ? ?The patient  will follow up as needed. ? ?Document Attestation: ?I, Richard Wiggins/Sowjanya Panditi, have reviewed and updated documentation for [facility], MD, utilizing [employer] DAX.  ? ?Electronically signed by Lauris Poag, MD at 10/16/2021 11:35 AM EDT  ? ? ?Reviewed  H+P. ?No changes noted. ? ?

## 2021-10-17 LAB — CBC
HCT: 17.4 % — ABNORMAL LOW (ref 39.0–52.0)
Hemoglobin: 5.5 g/dL — ABNORMAL LOW (ref 13.0–17.0)
MCH: 27.5 pg (ref 26.0–34.0)
MCHC: 31.6 g/dL (ref 30.0–36.0)
MCV: 87 fL (ref 80.0–100.0)
Platelets: 224 10*3/uL (ref 150–400)
RBC: 2 MIL/uL — ABNORMAL LOW (ref 4.22–5.81)
RDW: 15.9 % — ABNORMAL HIGH (ref 11.5–15.5)
WBC: 6.3 10*3/uL (ref 4.0–10.5)
nRBC: 0 % (ref 0.0–0.2)

## 2021-10-17 LAB — HEMOGLOBIN AND HEMATOCRIT, BLOOD
HCT: 23.1 % — ABNORMAL LOW (ref 39.0–52.0)
Hemoglobin: 7.6 g/dL — ABNORMAL LOW (ref 13.0–17.0)

## 2021-10-17 LAB — BASIC METABOLIC PANEL
Anion gap: 7 (ref 5–15)
BUN: 18 mg/dL (ref 8–23)
CO2: 26 mmol/L (ref 22–32)
Calcium: 8.2 mg/dL — ABNORMAL LOW (ref 8.9–10.3)
Chloride: 99 mmol/L (ref 98–111)
Creatinine, Ser: 1.03 mg/dL (ref 0.61–1.24)
GFR, Estimated: >60 mL/min (ref >60)
Glucose, Bld: 172 mg/dL — ABNORMAL HIGH (ref 70–99)
Potassium: 4.4 mmol/L (ref 3.5–5.1)
Sodium: 132 mmol/L — ABNORMAL LOW (ref 135–145)

## 2021-10-17 LAB — PREPARE RBC (CROSSMATCH)

## 2021-10-17 MED ORDER — SODIUM CHLORIDE 0.9% IV SOLUTION: Freq: Once | INTRAVENOUS | Status: AC

## 2021-10-17 NOTE — Plan of Care (Signed)
?Problem: Education: ?Goal: Knowledge of General Education information will improve ?Description: Including pain rating scale, medication(s)/side effects and non-pharmacologic comfort measures ?10/17/2021 0612 by Charisse March, LPN ?Outcome: Progressing ?10/17/2021 0612 by Charisse March, LPN ?Outcome: Progressing ?  ?Problem: Health Behavior/Discharge Planning: ?Goal: Ability to manage health-related needs will improve ?10/17/2021 0612 by Charisse March, LPN ?Outcome: Progressing ?10/17/2021 0612 by Charisse March, LPN ?Outcome: Progressing ?  ?Problem: Clinical Measurements: ?Goal: Ability to maintain clinical measurements within normal limits will improve ?10/17/2021 0612 by Charisse March, LPN ?Outcome: Progressing ?10/17/2021 0612 by Charisse March, LPN ?Outcome: Progressing ?Goal: Will remain free from infection ?10/17/2021 0612 by Charisse March, LPN ?Outcome: Progressing ?10/17/2021 0612 by Charisse March, LPN ?Outcome: Progressing ?Goal: Diagnostic test results will improve ?10/17/2021 0612 by Charisse March, LPN ?Outcome: Progressing ?10/17/2021 0612 by Charisse March, LPN ?Outcome: Progressing ?Goal: Respiratory complications will improve ?10/17/2021 0612 by Charisse March, LPN ?Outcome: Progressing ?10/17/2021 0612 by Charisse March, LPN ?Outcome: Progressing ?Goal: Cardiovascular complication will be avoided ?10/17/2021 0612 by Charisse March, LPN ?Outcome: Progressing ?10/17/2021 0612 by Charisse March, LPN ?Outcome: Progressing ?  ?Problem: Activity: ?Goal: Risk for activity intolerance will decrease ?10/17/2021 0612 by Charisse March, LPN ?Outcome: Progressing ?10/17/2021 0612 by Charisse March, LPN ?Outcome: Progressing ?  ?Problem: Nutrition: ?Goal: Adequate nutrition will be maintained ?10/17/2021 0612 by Charisse March, LPN ?Outcome: Progressing ?10/17/2021 0612 by Charisse March, LPN ?Outcome: Progressing ?  ?Problem: Coping: ?Goal: Level of anxiety will decrease ?10/17/2021 0612 by Charisse March, LPN ?Outcome: Progressing ?10/17/2021 0612 by Charisse March, LPN ?Outcome: Progressing ?  ?Problem: Elimination: ?Goal: Will not experience complications related to bowel motility ?10/17/2021 0612 by Charisse March, LPN ?Outcome: Progressing ?10/17/2021 0612 by Charisse March, LPN ?Outcome: Progressing ?Goal: Will not experience complications related to urinary retention ?10/17/2021 0612 by Charisse March, LPN ?Outcome: Progressing ?10/17/2021 0612 by Charisse March, LPN ?Outcome: Progressing ?  ?Problem: Pain Managment: ?Goal: General experience of comfort will improve ?10/17/2021 0612 by Charisse March, LPN ?Outcome: Progressing ?10/17/2021 0612 by Charisse March, LPN ?Outcome: Progressing ?  ?Problem: Safety: ?Goal: Ability to remain free from injury will improve ?10/17/2021 0612 by Charisse March, LPN ?Outcome: Progressing ?10/17/2021 0612 by Charisse March, LPN ?Outcome: Progressing ?  ?Problem: Skin Integrity: ?Goal: Risk for impaired skin integrity will decrease ?10/17/2021 0612 by Charisse March, LPN ?Outcome: Progressing ?10/17/2021 0612 by Charisse March, LPN ?Outcome: Progressing ?  ?Problem: Education: ?Goal: Knowledge of the prescribed therapeutic regimen will improve ?10/17/2021 0612 by Charisse March, LPN ?Outcome: Progressing ?10/17/2021 0612 by Charisse March, LPN ?Outcome: Progressing ?Goal: Understanding of discharge needs will improve ?10/17/2021 0612 by Charisse March, LPN ?Outcome: Progressing ?10/17/2021 0612 by Charisse March, LPN ?Outcome: Progressing ?Goal: Individualized Educational Video(s) ?10/17/2021 0612 by Charisse March, LPN ?Outcome: Progressing ?10/17/2021 0612 by Charisse March, LPN ?Outcome: Progressing ?  ?Problem: Activity: ?Goal: Ability to avoid complications of mobility impairment will improve ?10/17/2021 0612 by Charisse March, LPN ?Outcome: Progressing ?10/17/2021 0612 by Charisse March, LPN ?Outcome: Progressing ?Goal: Ability to tolerate increased activity  will improve ?10/17/2021 0612 by Charisse March, LPN ?Outcome: Progressing ?10/17/2021 0612 by Charisse March, LPN ?Outcome: Progressing ?  ?Problem: Clinical Measurements: ?Goal: Postoperative complications will be avoided or minimized ?10/17/2021 0612 by Charisse March, LPN ?Outcome: Progressing ?10/17/2021 0612 by Tarri Abernethy  E, LPN ?Outcome: Progressing ?  ?Problem: Pain Management: ?Goal: Pain level will decrease with appropriate interventions ?10/17/2021 0612 by Charisse March, LPN ?Outcome: Progressing ?10/17/2021 0612 by Charisse March, LPN ?Outcome: Progressing ?  ?Problem: Skin Integrity: ?Goal: Will show signs of wound healing ?10/17/2021 0612 by Charisse March, LPN ?Outcome: Progressing ?10/17/2021 0612 by Charisse March, LPN ?Outcome: Progressing ?  ?

## 2021-10-17 NOTE — Progress Notes (Signed)
?  Subjective: ?1 Day Post-Op Procedure(s) (LRB): ?IRRIGATION AND DEBRIDEMENT OF INFECTED TOTAL HIP (Right) ?Patient reports pain as well-controlled.  Main complaint is burning along the anterior thigh near the incision. ?Negative for chest pain and shortness of breath ?Fever: no ?Gastrointestinal: negative for nausea and vomiting.  Patient has not had a bowel movement. ? ?Objective: ?Vital signs in last 24 hours: ?Temp:  [97.4 ?F (36.3 ?C)-98.7 ?F (37.1 ?C)] 97.6 ?F (36.4 ?C) (04/08 0825) ?Pulse Rate:  [65-78] 65 (04/08 0825) ?Resp:  [16-30] 18 (04/08 0825) ?BP: (108-147)/(60-79) 110/60 (04/08 0825) ?SpO2:  [92 %-100 %] 98 % (04/08 0825) ? ?Intake/Output from previous day: ? ?Intake/Output Summary (Last 24 hours) at 10/17/2021 1201 ?Last data filed at 10/17/2021 1032 ?Gross per 24 hour  ?Intake 1613.48 ml  ?Output 350 ml  ?Net 1263.48 ml  ?  ?Intake/Output this shift: ?Total I/O ?In: 360 [P.O.:360] ?Out: -  ? ?Labs: ?Recent Labs  ?  10/17/21 ?0633  ?HGB 5.5*  ? ?Recent Labs  ?  10/17/21 ?0633  ?WBC 6.3  ?RBC 2.00*  ?HCT 17.4*  ?PLT 224  ? ?Recent Labs  ?  10/17/21 ?0633  ?NA 132*  ?K 4.4  ?CL 99  ?CO2 26  ?BUN 18  ?CREATININE 1.03  ?GLUCOSE 172*  ?CALCIUM 8.2*  ? ?No results for input(s): LABPT, INR in the last 72 hours. ? ? ?EXAM ?General - Patient is Alert, Appropriate, and Oriented ?Extremity - Neurovascular intact ?Dorsiflexion/Plantar flexion intact ?Compartment soft ?Dressing/Incision -Prevena in place, but sponge is inflated. WoundVac not on. ?Motor Function - intact, moving foot and toes well on exam.  ?Cardiovascular- Regular rate and rhythm, no murmurs/rubs/gallops ?Respiratory- Lungs clear to auscultation bilaterally ?Gastrointestinal- soft, nontender, and active bowel sounds ? ? ?Assessment/Plan: ?1 Day Post-Op Procedure(s) (LRB): ?IRRIGATION AND DEBRIDEMENT OF INFECTED TOTAL HIP (Right) ?Principal Problem: ?  Infection of prosthetic total hip joint, initial encounter ([place]) ? ?Estimated body mass index is  26.19 kg/m? as calculated from the following: ?  Height as of this encounter: 5\' 10"  (1.778 m). ?  Weight as of this encounter: 82.8 kg. ? ?Labs show Hg of 5.5. Transfusion pending.  ? ?Fresh Prevena dressing applied. Instructed RN that if seal is lost for more than 2 hours, dressing has to be removed.  ? ?DVT Prophylaxis - Eliquis, ASA 81  ?Weight-Bearing as tolerated to right leg ? ?Cassell Smiles, PA-C ?[facility] ?10/17/2021, 12:01 PM ? ?

## 2021-10-17 NOTE — Progress Notes (Addendum)
1101 ?Consent for 2 unit of PRBC to be infused. Signed by pt, witnessed by nurse  and place in chart. ? ?1234 ?1 unit of PRBC infusing at this time. Nurse will remain at bedside for first 29mins to ensure no transfusion reaction. Wife in room as well. ? ?1612 ?2nd unit of PRBC started at this time nurse will remain in room with pt for first 41mins. Pt declines having any SOB, chest pain or itching. Nurse will continue to monitor.   ?

## 2021-10-17 NOTE — Progress Notes (Signed)
PT Cancellation Note ? ?Patient Details ?Name: Richard Wiggins ?MRN: [id number] ?DOB: 27-Mar-1951 ? ? ?Cancelled Treatment:    Reason Eval/Treat Not Completed: Patient not medically ready PT orders received, chart reviewed. Pt noted to have critically low Hgb of 5.5. Per protocol, will hold PT evaluation at this time & f/u once pt is medically appropriate. ? ?Lavone Nian, PT, DPT ?10/17/21, 8:07 AM ? ? ?Waunita Schooner ?10/17/2021, 8:06 AM ?

## 2021-10-18 LAB — TYPE AND SCREEN
ABO/RH(D): B POS
Antibody Screen: NEGATIVE
Unit division: 0
Unit division: 0

## 2021-10-18 LAB — CBC
HCT: 23.1 % — ABNORMAL LOW (ref 39.0–52.0)
Hemoglobin: 7.7 g/dL — ABNORMAL LOW (ref 13.0–17.0)
MCH: 28.7 pg (ref 26.0–34.0)
MCHC: 33.3 g/dL (ref 30.0–36.0)
MCV: 86.2 fL (ref 80.0–100.0)
Platelets: 237 10*3/uL (ref 150–400)
RBC: 2.68 MIL/uL — ABNORMAL LOW (ref 4.22–5.81)
RDW: 15.4 % (ref 11.5–15.5)
WBC: 6.3 10*3/uL (ref 4.0–10.5)
nRBC: 0 % (ref 0.0–0.2)

## 2021-10-18 LAB — CULTURE, BLOOD (ROUTINE X 2)
Culture: NO GROWTH
Culture: NO GROWTH
Special Requests: ADEQUATE

## 2021-10-18 LAB — BPAM RBC
Blood Product Expiration Date: 202305032359
Blood Product Expiration Date: 202305062359
ISSUE DATE / TIME: 202304081219
ISSUE DATE / TIME: 202304081602
Unit Type and Rh: 7300
Unit Type and Rh: 7300

## 2021-10-18 NOTE — Progress Notes (Signed)
?  Subjective: ?2 Days Post-Op Procedure(s) (LRB): ?IRRIGATION AND DEBRIDEMENT OF INFECTED TOTAL HIP (Right) ?Patient reports pain as  well controlled . Wife at bedside.    ?Patient is well, and has had no acute complaints or problems ?Plan is to go Home after hospital stay. ?Negative for chest pain and shortness of breath ?Fever: no ?Gastrointestinal: negative for nausea and vomiting.  Patient has not had a bowel movement. ? ?Objective: ?Vital signs in last 24 hours: ?Temp:  [97 ?F (36.1 ?C)-98 ?F (36.7 ?C)] 97.8 ?F (36.6 ?C) (04/09 0810) ?Pulse Rate:  [60-70] 69 (04/09 0810) ?Resp:  [15-18] 18 (04/09 0810) ?BP: (97-152)/(55-89) 148/76 (04/09 0810) ?SpO2:  [95 %-100 %] 96 % (04/09 0810) ? ?Intake/Output from previous day: ? ?Intake/Output Summary (Last 24 hours) at 10/18/2021 1100 ?Last data filed at 10/18/2021 0327 ?Gross per 24 hour  ?Intake 2033.75 ml  ?Output 1155 ml  ?Net 878.75 ml  ?  ?Intake/Output this shift: ?No intake/output data recorded. ? ?Labs: ?Recent Labs  ?  10/17/21 ?0633 10/17/21 ?2059 10/18/21 ?0427  ?HGB 5.5* 7.6* 7.7*  ? ?Recent Labs  ?  10/17/21 ?7062 10/17/21 ?2059 10/18/21 ?0427  ?WBC 6.3  --  6.3  ?RBC 2.00*  --  2.68*  ?HCT 17.4* 23.1* 23.1*  ?PLT 224  --  237  ? ?Recent Labs  ?  10/17/21 ?0633  ?NA 132*  ?K 4.4  ?CL 99  ?CO2 26  ?BUN 18  ?CREATININE 1.03  ?GLUCOSE 172*  ?CALCIUM 8.2*  ? ?No results for input(s): LABPT, INR in the last 72 hours. ? ? ?EXAM ?General - Patient is Alert, Appropriate, and Oriented ?Extremity - Neurovascular intact ?Compartment soft ?Dressing/Incision -Prevena in place and working, no drainage in cannister  ?Motor Function - intact, moving foot and toes well on exam.    ?Cardiovascular- Regular rate and rhythm, no murmurs/rubs/gallops ?Respiratory- Lungs clear to auscultation bilaterally ?Gastrointestinal- soft, nontender, and active bowel sounds ? ? ?Assessment/Plan: ?2 Days Post-Op Procedure(s) (LRB): ?IRRIGATION AND DEBRIDEMENT OF INFECTED TOTAL HIP  (Right) ?Principal Problem: ?  Infection of prosthetic total hip joint, initial encounter ([place]) ? ?Estimated body mass index is 26.19 kg/m? as calculated from the following: ?  Height as of this encounter: 5\' 10"  (1.778 m). ?  Weight as of this encounter: 82.8 kg. ?Advance diet ?Up with therapy ? ?Labs reviewed, Hg is 7.7 ? ?  ? ?DVT Prophylaxis - Eliquis, ASA 81  ?Weight-Bearing as tolerated to right leg ? ?Cassell Smiles, PA-C ?[facility]opa[facility] Surgery ?10/18/2021, 11:00 AM ? ?

## 2021-10-18 NOTE — Plan of Care (Signed)

## 2021-10-18 NOTE — Evaluation (Signed)
Physical Therapy Evaluation ?Patient Details ?Name: Richard Wiggins ?MRN: [id number] ?DOB: [birth year] ?Today's Date: 10/18/2021 ? ?History of Present Illness ? Patient is a 71 year old male admitted with Right THA Infection- Now Irrigation and drainage of right Hip. Patient has past medical history significant for Stage 3 Lung Cancer and previous Right THA in 2008 while living in [place]. Patient was most recently admitted to hospital on 09/20/2021 due to dx: Hernia  ?Clinical Impression ? Patient was alert and agreeable to PT Evaluation today. He reported 4/10 Right hip pain at rest. He presents today with right LE weakness, Pain limited right LE with hip flex/heel slides. He performed well with transfers requiring CGA and able to walk maintaining Right LE WBAT using RW today without report of any increased pain- only limited by fatigue. He presents with functional impairments including Right LE weakness, Right anterior leg wound- wound vac, pain limited mobility and would benefit from skilled PT services to improve his overall functional strength, safety, and mobility. Recommendation at this time is to discharge when appropriate with Home health Services. ?   ? ?Recommendations for follow up therapy are one component of a multi-disciplinary discharge planning process, led by the attending physician.  Recommendations may be updated based on patient status, additional functional criteria and insurance authorization. ? ?Follow Up Recommendations Home health PT ? ?  ?Assistance Recommended at Discharge Intermittent Supervision/Assistance  ?Patient can return home with the following ? A little help with walking and/or transfers;A little help with bathing/dressing/bathroom;Assistance with cooking/housework;Direct supervision/assist for medications management;Direct supervision/assist for financial management;Assist for transportation;Help with stairs or ramp for entrance ? ?  ?Equipment Recommendations    ?Recommendations  for Other Services ?    ?  ?Functional Status Assessment Patient has had a recent decline in their functional status and demonstrates the ability to make significant improvements in function in a reasonable and predictable amount of time.  ? ?  ?Precautions / Restrictions Precautions ?Precautions: Fall ?Restrictions ?Weight Bearing Restrictions: Yes ?RLE Weight Bearing: Weight bearing as tolerated  ? ?  ? ?Mobility ? Bed Mobility ?  ?  ?  ?  ?  ?  ?  ?General bed mobility comments: Patient was up in chair upon arrival and requested to go back to chair ?  ? ?Transfers ?Overall transfer level: Needs assistance ?Equipment used: Rolling walker (2 wheels) ?Transfers: Sit to/from Stand ?Sit to Stand: Min guard ?  ?  ?  ?  ?  ?General transfer comment: Patient required CGA to stand from chair and VC for hand placement ?  ? ?Ambulation/Gait ?Ambulation/Gait assistance: Min guard ?Gait Distance (Feet): 60 Feet ?Assistive device: Rolling walker (2 wheels) ?Gait Pattern/deviations: Step-through pattern, Decreased step length - right, Decreased step length - left ?Gait velocity: decreased ?Gait velocity interpretation: <1.31 ft/sec, indicative of household ambulator ?  ?General Gait Details: Short reciprocal steps with decreased heel to toe sequencing on right LE. ? ?Stairs ?  ?  ?  ?  ?  ? ?Wheelchair Mobility ?  ? ?Modified Rankin (Stroke Patients Only) ?  ? ?  ? ?Balance Overall balance assessment: Mild deficits observed, not formally tested ?  ?  ?  ?  ?  ?  ?  ?  ?  ?  ?  ?  ?  ?  ?  ?  ?  ?  ?   ? ? ? ?Pertinent Vitals/Pain Pain Assessment ?Pain Assessment: 0-10 ?Pain Score: 4  ?Pain Descriptors / Indicators: Aching, Sore ?Pain  Intervention(s): Limited activity within patient's tolerance, Monitored during session, Premedicated before session, Repositioned  ? ? ?Home Living Family/patient expects to be discharged to:: Private residence ?Living Arrangements: Spouse/significant other ?Available Help at Discharge:  Family;Available 24 hours/day ?Type of Home: House ?Home Access: Level entry ?  ?  ?  ?Home Layout: Able to live on main level with bedroom/bathroom ?Home Equipment: Conservation officer, nature (2 wheels) ?   ?  ?Prior Function Prior Level of Function : Needs assist ?  ?  ?  ?Physical Assist : ADLs (physical) ?  ?ADLs (physical): Dressing;IADLs ?Mobility Comments: Patient reports he has been using his walker since the increase in hip pain ?  ?  ? ? ?Hand Dominance  ? Dominant Hand: Right ? ?  ?Extremity/Trunk Assessment  ? Upper Extremity Assessment ?Upper Extremity Assessment: Overall WFL for tasks assessed ?  ? ?Lower Extremity Assessment ?Lower Extremity Assessment: RLE deficits/detail ?RLE Deficits / Details: Pain limited hip flex/knee bend ?RLE: Unable to fully assess due to pain ?RLE Sensation: WNL ?RLE Coordination: WNL ?  ? ?Cervical / Trunk Assessment ?Cervical / Trunk Assessment: Normal  ?Communication  ? Communication: No difficulties  ?Cognition Arousal/Alertness: Awake/alert ?Behavior During Therapy: [facility] for tasks assessed/performed ?Overall Cognitive Status: Within Functional Limits for tasks assessed ?  ?  ?  ?  ?  ?  ?  ?  ?  ?  ?  ?  ?  ?  ?  ?  ?  ?  ?  ? ?  ?General Comments General comments (skin integrity, edema, etc.): No increased swelling in B LEs ? ?  ?Exercises General Exercises - Lower Extremity ?Quad Sets: AROM, Both, 10 reps, Other (comment) (Reclined) ?Gluteal Sets: AROM, 10 reps, Both, Other (comment) (Reclined) ?Long Arc Quad: AROM, Both, 10 reps, Seated ?Hip ABduction/ADduction: AAROM, 10 reps, Both, Other (comment) (reclined) ?Toe Raises: AROM, 10 reps, Both, Seated ?Heel Raises: AROM, Both, 10 reps, Seated  ? ?Assessment/Plan  ?  ?PT Assessment Patient needs continued PT services  ?PT Problem List Decreased strength;Decreased range of motion;Decreased activity tolerance;Decreased balance;Decreased mobility;Decreased coordination;Decreased knowledge of precautions;Decreased safety  awareness;Pain;Decreased skin integrity ? ?   ?  ?PT Treatment Interventions DME instruction;Gait training;Stair training;Functional mobility training;Therapeutic activities;Therapeutic exercise;Balance training;Neuromuscular re-education;Patient/family education;Modalities   ? ?PT Goals (Current goals can be found in the Care Plan section)  ?Acute Rehab PT Goals ?Patient Stated Goal: To feel better, heal infection, gain strength and return home ?PT Goal Formulation: With patient/family ?Time For Goal Achievement: 11/01/21 ?Potential to Achieve Goals: Good ? ?  ?Frequency 7X/week ?  ? ? ?Co-evaluation   ?  ?  ?  ?  ? ? ?  ?AM-PAC PT "6 Clicks" Mobility  ?Outcome Measure Help needed turning from your back to your side while in a flat bed without using bedrails?: A Little ?Help needed moving from lying on your back to sitting on the side of a flat bed without using bedrails?: A Little ?Help needed moving to and from a bed to a chair (including a wheelchair)?: A Little ?Help needed standing up from a chair using your arms (e.g., wheelchair or bedside chair)?: A Little ?Help needed to walk in hospital room?: A Little ?Help needed climbing 3-5 steps with a railing? : A Lot ?6 Click Score: 17 ? ?  ?End of Session Equipment Utilized During Treatment: Gait belt ?Activity Tolerance: Patient tolerated treatment well;No increased pain ?Patient left: in chair;with call bell/phone within reach;with chair alarm set;with family/visitor present ?Nurse Communication: Mobility  status;Weight bearing status;Precautions ?PT Visit Diagnosis: Unsteadiness on feet (R26.81);Other abnormalities of gait and mobility (R26.89);Muscle weakness (generalized) (M62.81);Difficulty in walking, not elsewhere classified (R26.2);Pain ?Pain - Right/Left: Right ?Pain - part of body: Hip ?  ? ?Time: 9470-7615 ?PT Time Calculation (min) (ACUTE ONLY): 45 min ? ? ?Charges:   PT Evaluation ?$PT Eval Moderate Complexity: 1 Mod ?PT Treatments ?$Gait Training:  8-22 mins ?$Therapeutic Exercise: 8-22 mins ?  ?   ? ? ?Lewis Moccasin, PT ?10/18/2021, 1:26 PM ? ?

## 2021-10-19 ENCOUNTER — Ambulatory Visit: Payer: No Typology Code available for payment source

## 2021-10-19 ENCOUNTER — Encounter: Payer: Self-pay | Admitting: Oncology

## 2021-10-19 ENCOUNTER — Encounter: Payer: Self-pay | Admitting: Orthopedic Surgery

## 2021-10-19 ENCOUNTER — Inpatient Hospital Stay: Payer: Self-pay

## 2021-10-19 DIAGNOSIS — Z96649 Presence of unspecified artificial hip joint: Secondary | ICD-10-CM | POA: Diagnosis not present

## 2021-10-19 DIAGNOSIS — T8459XA Infection and inflammatory reaction due to other internal joint prosthesis, initial encounter: Secondary | ICD-10-CM | POA: Diagnosis not present

## 2021-10-19 LAB — CBC
HCT: 25.9 % — ABNORMAL LOW (ref 39.0–52.0)
Hemoglobin: 8.3 g/dL — ABNORMAL LOW (ref 13.0–17.0)
MCH: 28.3 pg (ref 26.0–34.0)
MCHC: 32 g/dL (ref 30.0–36.0)
MCV: 88.4 fL (ref 80.0–100.0)
Platelets: 254 10*3/uL (ref 150–400)
RBC: 2.93 MIL/uL — ABNORMAL LOW (ref 4.22–5.81)
RDW: 15.7 % — ABNORMAL HIGH (ref 11.5–15.5)
WBC: 6.1 10*3/uL (ref 4.0–10.5)
nRBC: 0.3 % — ABNORMAL HIGH (ref 0.0–0.2)

## 2021-10-19 LAB — CREATININE, SERUM
Creatinine, Ser: 1.04 mg/dL (ref 0.61–1.24)
GFR, Estimated: >60 mL/min (ref >60)

## 2021-10-19 LAB — VANCOMYCIN, TROUGH: Vancomycin Tr: 23 ug/mL (ref 15–20)

## 2021-10-19 LAB — CK: Total CK: 63 U/L (ref 49–397)

## 2021-10-19 MED ORDER — VANCOMYCIN HCL 1250 MG/250ML IV SOLN
1250.0000 mg | INTRAVENOUS | Status: DC
  Administered 2021-10-19: 1250 mg via INTRAVENOUS
  Filled 2021-10-19: qty 250

## 2021-10-19 MED ORDER — SODIUM CHLORIDE 0.9 % IV SOLN
2.0000 g | Freq: Two times a day (BID) | INTRAVENOUS | Status: DC
  Administered 2021-10-19 – 2021-10-20 (×2): 2 g via INTRAVENOUS
  Filled 2021-10-19 (×2): qty 2
  Filled 2021-10-19: qty 12.5

## 2021-10-19 MED ORDER — OXYCODONE HCL ER 10 MG PO T12A: 10.0000 mg | EXTENDED_RELEASE_TABLET | Freq: Two times a day (BID) | ORAL | 0 refills | Status: AC

## 2021-10-19 MED ORDER — HYDROMORPHONE HCL 2 MG PO TABS: 1.0000 mg | ORAL_TABLET | ORAL | 0 refills | Status: DC | PRN

## 2021-10-19 NOTE — Progress Notes (Signed)
Pharmacy Antibiotic Note ? ?Richard Wiggins is a 71 y.o. male admitted on 10/16/2021 with  prosthetic hip infection .  Pharmacy has been consulted for vancomycin and cefepime dosing. Patient hip replacement in 2008.  Worsening hip pain w/ CT 4/4 revealing large hip effusion.  He is on chemotherapy for lung cancer.  He underwent I&D 4/7 (per notes, not candidate for two-stage revision due to cancer/chemo).  Operation report notes purulent synovial fluid.  Pharmacy asked to dose vancomycin and cefepime pending OR cultures.  A[[ears cefazolin given in OR after cultures taken. Vancomycin 1gm IVPB ordered but no documentation that it was given (vancomycin powder used for Stimulan beads) ? ?Today, 10/19/2021 ?Renal: Scr 1.04 ?WBC WNL ?Afebrile ?4/5 Ortho office cx: NGTD ?4/7 OR cultures: NGTD ?CK 4/10 = 63 ?Vancomycin trough prior to 4th dose on 4/10 at 11:16 = 23 mcg/ml on vancomycin 1750mg  IV q24h.  Previous dose given 4/9 at 11:48 ? ?Plan: ?Based on vancomycin trough this morning, adjust vancomycin to 1250mg  IV q24h for new trough of 16.4 mcg/mL ?Goal for home is vancomycin trough 15-20 mcg/mL ?Cefepime 2gm IV q8h ?Monitor renal function  ?Check trough Monday ?Follow-up likely OPAT - final antibiotic selection pending [facility] insurance coverage ? ?Height: 5\' 10"  (177.8 cm) ?Weight: 82.8 kg (182 lb 8 oz) ?IBW/kg (Calculated) : 73 ? ?Temp (24hrs), Avg:97.8 ?F (36.6 ?C), Min:97.4 ?F (36.3 ?C), Max:98.3 ?F (36.8 ?C) ? ?Recent Labs  ?Lab 10/13/21 ?2128 10/13/21 ?2327 10/17/21 ?2671 10/18/21 ?2458 10/19/21 ?0998 10/19/21 ?1116  ?WBC 4.2  --  6.3 6.3 6.1  --   ?CREATININE 1.09  --  1.03  --  1.04  --   ?LATICACIDVEN 0.9 0.8  --   --   --   --   ?[place]  --   --   --   --   --  23*  ? ?  ?Estimated Creatinine Clearance: 67.3 mL/min (by C-G formula based on SCr of 1.04 mg/dL).   ? ?Allergies  ?Allergen Reactions  ? Tape Rash  ?  plastic  ? Wound Dressing Adhesive Itching and Rash  ?  Gets stuck to skin, makes wound spread   ? ? ?Antimicrobials this admission: ?Vancomycin 4/7 >> ?Cefepime 4/7 >> ? ?Dose adjustments this admission: ? ?Microbiology results: ?4/7 OR hip cx: ? ?Thank you for allowing pharmacy to be a part of this patient?s care. ? ?Doreene Eland, PharmD, BCPS, BCIDP ?Work Cell: [phone] ?10/19/2021 11:58 AM ? ? ? ?

## 2021-10-19 NOTE — Discharge Instructions (Signed)
?ANTERIOR APPROACH  HIP I&D POSTOPERATIVE DIRECTIONS ? ? ?Hip Rehabilitation, Guidelines Following Surgery  ?The results of a hip operation are greatly improved after range of motion and muscle strengthening exercises. Follow all safety measures which are given to protect your hip. If any of these exercises cause increased pain or swelling in your joint, decrease the amount until you are comfortable again. Then slowly increase the exercises. Call your caregiver if you have problems or questions.  ? ?HOME CARE INSTRUCTIONS  ?Remove items at home which could result in a fall. This includes throw rugs or furniture in walking pathways.  ?ICE to the affected hip every three hours for 30 minutes at a time and then as needed for pain and swelling.  Continue to use ice on the hip for pain and swelling from surgery. You may notice swelling that will progress down to the foot and ankle.  This is normal after surgery.  Elevate the leg when you are not up walking on it.   ?Continue to use the breathing machine which will help keep your temperature down.  It is common for your temperature to cycle up and down following surgery, especially at night when you are not up moving around and exerting yourself.  The breathing machine keeps your lungs expanded and your temperature down. ?Do not place pillow under knee, focus on keeping the knee straight while resting ? ?DIET ?You may resume your previous home diet once your are discharged from the hospital. ? ?DRESSING / WOUND CARE / SHOWERING ?Please remove provena negative pressure dressing on 10/26/2021 and apply honey comb dressing. Keep dressing clean and dry at all times.  ? ?ACTIVITY ?Walk with your walker as instructed. ?Use walker as long as suggested by your caregivers. ?Avoid periods of inactivity such as sitting longer than an hour when not asleep. This helps prevent blood clots.  ?You may resume a sexual relationship in one month or when given the OK by your doctor.  ?You  may return to work once you are cleared by your doctor.  ?Do not drive a car for 6 weeks or until released by you surgeon.  ?Do not drive while taking narcotics. ? ?WEIGHT BEARING ?Weight bearing as tolerated. Use walker/cane as needed for at least 4 weeks post op. ? ?POSTOPERATIVE CONSTIPATION PROTOCOL ?Constipation - defined medically as fewer than three stools per week and severe constipation as less than one stool per week. ? ?One of the most common issues patients have following surgery is constipation.  Even if you have a regular bowel pattern at home, your normal regimen is likely to be disrupted due to multiple reasons following surgery.  Combination of anesthesia, postoperative narcotics, change in appetite and fluid intake all can affect your bowels.  In order to avoid complications following surgery, here are some recommendations in order to help you during your recovery period. ? ?Colace (docusate) - Pick up an over-the-counter form of Colace or another stool softener and take twice a day as long as you are requiring postoperative pain medications.  Take with a full glass of water daily.  If you experience loose stools or diarrhea, hold the colace until you stool forms back up.  If your symptoms do not get better within 1 week or if they get worse, check with your doctor. ? ?Dulcolax (bisacodyl) - Pick up over-the-counter and take as directed by the product packaging as needed to assist with the movement of your bowels.  Take with a full glass of  water.  Use this product as needed if not relieved by Colace only.  ? ?MiraLax (polyethylene glycol) - Pick up over-the-counter to have on hand.  MiraLax is a solution that will increase the amount of water in your bowels to assist with bowel movements.  Take as directed and can mix with a glass of water, juice, soda, coffee, or tea.  Take if you go more than two days without a movement. ?Do not use MiraLax more than once per day. Call your doctor if you are  still constipated or irregular after using this medication for 7 days in a row. ? ?If you continue to have problems with postoperative constipation, please contact the office for further assistance and recommendations.  If you experience "the worst abdominal pain ever" or develop nausea or vomiting, please contact the office immediatly for further recommendations for treatment. ? ?ITCHING ? If you experience itching with your medications, try taking only a single pain pill, or even half a pain pill at a time.  You can also use Benadryl over the counter for itching or also to help with sleep.  ? ?TED HOSE STOCKINGS ?Wear the elastic stockings on both legs for six weeks following surgery during the day but you may remove then at night for sleeping. ? ?MEDICATIONS ?See your medication summary on the ?After Visit Summary? that the nursing staff will review with you prior to discharge.  You may have some home medications which will be placed on hold until you complete the course of blood thinner medication.  It is important for you to complete the blood thinner medication as prescribed by your surgeon.  Continue your approved medications as instructed at time of discharge. ? ?PRECAUTIONS ?If you experience chest pain or shortness of breath - call 911 immediately for transfer to the hospital emergency department.  ?If you develop a fever greater that 101 F, purulent drainage from wound, increased redness or drainage from wound, foul odor from the wound/dressing, or calf pain - CONTACT YOUR SURGEON.   ?                                                ?FOLLOW-UP APPOINTMENTS ?Make sure you keep all of your appointments after your operation with your surgeon and caregivers. You should call the office at the above phone number and make an appointment for approximately two weeks after the date of your surgery or on the date instructed by your surgeon outlined in the "After Visit Summary". ? ?RANGE OF MOTION AND STRENGTHENING  EXERCISES  ?These exercises are designed to help you keep full movement of your hip joint. Follow your caregiver's or physical therapist's instructions. Perform all exercises about fifteen times, three times per day or as directed. Exercise both hips, even if you have had only one joint replacement. These exercises can be done on a training (exercise) mat, on the floor, on a table or on a bed. Use whatever works the best and is most comfortable for you. Use music or television while you are exercising so that the exercises are a pleasant break in your day. This will make your life better with the exercises acting as a break in routine you can look forward to.  ?Lying on your back, slowly slide your foot toward your buttocks, raising your knee up off the floor. Then slowly slide your  foot back down until your leg is straight again.  ?Lying on your back spread your legs as far apart as you can without causing discomfort.  ?Lying on your side, raise your upper leg and foot straight up from the floor as far as is comfortable. Slowly lower the leg and repeat.  ?Lying on your back, tighten up the muscle in the front of your thigh (quadriceps muscles). You can do this by keeping your leg straight and trying to raise your heel off the floor. This helps strengthen the largest muscle supporting your knee.  ?Lying on your back, tighten up the muscles of your buttocks both with the legs straight and with the knee bent at a comfortable angle while keeping your heel on the floor.  ? ?IF YOU ARE TRANSFERRED TO A SKILLED REHAB FACILITY ?If the patient is transferred to a skilled rehab facility following release from the hospital, a list of the current medications will be sent to the facility for the patient to continue.  When discharged from the skilled rehab facility, please have the facility set up the patient's [place] prior to being released. Also, the skilled facility will be responsible for providing the  patient with their medications at time of release from the facility to include their pain medication, the muscle relaxants, and their blood thinner medication. If the patient is still at the rehab facility a

## 2021-10-19 NOTE — Consult Note (Signed)
[facility] CM Inpatient Consult ? ? ?10/19/2021 ? ?Richard Wiggins ?Jun 08, 1951 ?[id number] ? ?[place] [facility] CM) ?  ?Patient was reviewed for less than 30 days  unplanned readmission and noted high unplanned readmission risk score. Assessed for post hospital chronic disease management care needs.  ? ?Per review, patient has primary provider at [facility]. Primary MD is outside of [facility] network of physicians.  ? ?Of note, [facility] Care Management services does not replace or interfere with any services that are arranged by inpatient case management or social work.  ? ?Netta Cedars, MSN, RN ?[facility] Liaison ?Phone [phone] ?Toll free office [phone]  ?

## 2021-10-19 NOTE — Plan of Care (Signed)

## 2021-10-19 NOTE — Progress Notes (Signed)
Spoke with Bet LPN regarding PICC placement order since patient already has an Implanted Port (port-a-cath) in place. ?

## 2021-10-19 NOTE — Progress Notes (Signed)
? ?Subjective: ?3 Days Post-Op Procedure(s) (LRB): ?IRRIGATION AND DEBRIDEMENT OF INFECTED TOTAL HIP (Right) ?Patient reports pain as mild.   ?Patient is well, and has had no acute complaints or problems ?Denies any CP, SOB, ABD pain. ?We will continue therapy today.  ?Plan is to go Home after hospital stay. ? ?Objective: ?Vital signs in last 24 hours: ?Temp:  [97.4 ?F (36.3 ?C)-98.3 ?F (36.8 ?C)] 97.7 ?F (36.5 ?C) (04/10 0730) ?Pulse Rate:  [70-75] 75 (04/10 0730) ?Resp:  [16-18] 16 (04/10 0730) ?BP: (127-156)/(72-93) 150/72 (04/10 0730) ?SpO2:  [95 %-97 %] 95 % (04/10 0730) ? ?Intake/Output from previous day: ?04/09 0701 - 04/10 0700 ?In: 360 [P.O.:360] ?Out: 800 [Urine:800] ?Intake/Output this shift: ?No intake/output data recorded. ? ?Recent Labs  ?  10/17/21 ?0633 10/17/21 ?2059 10/18/21 ?4315 10/19/21 ?0358  ?HGB 5.5* 7.6* 7.7* 8.3*  ? ?Recent Labs  ?  10/18/21 ?0427 10/19/21 ?0358  ?WBC 6.3 6.1  ?RBC 2.68* 2.93*  ?HCT 23.1* 25.9*  ?PLT 237 254  ? ?Recent Labs  ?  10/17/21 ?0633  ?NA 132*  ?K 4.4  ?CL 99  ?CO2 26  ?BUN 18  ?CREATININE 1.03  ?GLUCOSE 172*  ?CALCIUM 8.2*  ? ?No results for input(s): LABPT, INR in the last 72 hours. ? ?EXAM ?General - Patient is Alert, Appropriate, and Oriented ?Extremity - Neurovascular intact ?Sensation intact distally ?Intact pulses distally ?Dorsiflexion/Plantar flexion intact ?No cellulitis present ?Compartment soft ?Dressing - dressing C/D/I and no drainage, provena intact with out drainage ?Motor Function - intact, moving foot and toes well on exam.  ? ?Past Medical History:  ?Diagnosis Date  ? AAA (abdominal aortic aneurysm) ([place]) 2020  ? a. 2020 U/S: 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 -plan medical managmment with tight bp control; b. 09/2020 Abd U/S: 4.1cm.  ? Anxiety   ? Aortic atherosclerosis ([place])   ? Benign essential HTN 02/10/2020  ? BPH (benign prostatic hyperplasia) 02/09/2020  ? Brain aneurysm   ? Cancer of upper lobe of left lung ([place]) 04/2020  ? a.) ENB/EBUS  performed; Bx non-diagnostic. b.) presumed stage I NSCLC in the LUL; underwent SBRT (60 Gy over 5 fractions)  ? Carotid arterial disease ([place])   ? a. 05/2017 s/p R carotid endarterectomy following CVA.  ? 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 a. 2018 Echo: EF 30-35%; b. 02/2021 Echo: EF 55-60%, nl RV fxn. Triv MR. Asc Ao 82mm.  ? CKD (chronic kidney disease), stage III ([place])   ? Claudication [facility])   ? a. 09/2020 ABIs: R 0.91. L 0.99.  ? COPD (chronic obstructive pulmonary disease) ([place])   ? Coronary artery disease   ? a. 06/2017 PCI ([place]): s/p PCI to the RCA. LAD 50, LCX 20.  ? Depression   ? 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 a. 02/2021 Asc Ao 37mm.  ? Emphysema of lung ([place])   ? History of blood transfusion   ? HLD (hyperlipidemia)   ? IDA (iron deficiency anemia) 02/09/2020  ? 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  ? Inguinal hernia   ? right  ? Ischemic cardiomyopathy   ? Long term current use of anticoagulant   ? a.) apixaban  ? Marijuana use, continuous 02/10/2020  ? Nosebleed   ? Osteoporosis   ? PAF (paroxysmal atrial fibrillation) ([place])   ? a. 05/2017 Dx in setting of CVA. CHA2DS2VASc = 6-->Eliquis.  ? Pre-diabetes   ? Pulmonary emboli ([place]) 06/07/2017  ? Right  lower lobe pulmonary embolism small segmental, multifocal multifocal pneumonia, mediastinal lymphadenopathy, moderate centrilobular emphysema  ? Seizures ([place])   ? Sigmoid diverticulosis   ? a.) CT 01/27/2021: severe  ? Stroke [facility])   ? a. 05/2017 - hospitalized in [place]->prolonged hospitalization in setting of R CEA, PE, and finding of RCA dzs on cath; b. Residual left sided weakness to arm and leg.  ? ? ?Assessment/Plan:   ?3 Days Post-Op Procedure(s) (LRB): ?IRRIGATION AND DEBRIDEMENT OF INFECTED TOTAL HIP (Right) ?Principal Problem: ?  Infection of prosthetic total hip joint, initial encounter ([place]) ? ?Estimated body mass index is 26.19 kg/m? as calculated from the following: ?  Height as of  this encounter: 5\' 10"  (1.778 m). ?  Weight as of this encounter: 82.8 kg. ?Advance diet ?Up with therapy, pain and mobility much improved post I&D ?Acute post op blood loss anemia  - Hgb 8.3, trending up. S/p 1 unit PRBC 4/9 ?Continue with IV abs ?Appreciate ID input ?CM to assist with discharge ? ? ?DVT Prophylaxis - TED hose and Eliquis , aspirin ?Weight-Bearing as tolerated to right leg ? ? ?T. Rachelle Hora, PA-C ?[place] ?10/19/2021, 8:14 AM ?  ?

## 2021-10-19 NOTE — Progress Notes (Signed)
Met with the patient and his spouse in the room, He has a Port a cath for [employer], [facility] will use that, I spoke with Pam and she will arrange teaching ?He is set up with [facility] for PT and OT, they will add Nursing,  ?He has a rolling walker and raised toilet but needs a shower seat, [employer] to deliver to the home ?His wife provides transportation ?

## 2021-10-19 NOTE — Progress Notes (Signed)
Physical Therapy Treatment ?Patient Details ?Name: Richard Wiggins ?MRN: [id number] ?DOB: 1951/01/21 ?Today's Date: 10/19/2021 ? ? ?History of Present Illness Patient is a 71 year old male admitted with Right THA Infection- Now Irrigation and drainage of right Hip. Patient has past medical history significant for Stage 3 Lung Cancer and previous Right THA in 2008 while living in [place]. Patient was most recently admitted to hospital on 09/20/2021 due to dx: Hernia ? ?  ?PT Comments  ? ? Pt was pleasant and motivated to participate during the session and put forth good effort throughout. Pt increased max amb distance to 120 feet this session with a RW with good stability throughout.  Pt performed well during dynamic balance training with only min instability in semi-tandem that the pt was able to self-correct.  Pt will benefit from HHPT upon discharge to safely address deficits listed in patient problem list for decreased caregiver assistance and eventual return to PLOF. ? ?   ?Recommendations for follow up therapy are one component of a multi-disciplinary discharge planning process, led by the attending physician.  Recommendations may be updated based on patient status, additional functional criteria and insurance authorization. ? ?Follow Up Recommendations ? Home health PT ?  ?  ?Assistance Recommended at Discharge Intermittent Supervision/Assistance  ?Patient can return home with the following A little help with walking and/or transfers;A little help with bathing/dressing/bathroom;Assistance with cooking/housework;Direct supervision/assist for medications management;Assist for transportation;Help with stairs or ramp for entrance ?  ?Equipment Recommendations ? None recommended by PT  ?  ?Recommendations for Other Services   ? ? ?  ?Precautions / Restrictions Precautions ?Precautions: Fall ?Restrictions ?Weight Bearing Restrictions: Yes ?RLE Weight Bearing: Weight bearing as tolerated  ?  ? ?Mobility ? Bed Mobility ?   ?  ?  ?  ?  ?  ?  ?General bed mobility comments: NT, in recliner ?  ? ?Transfers ?Overall transfer level: Needs assistance ?Equipment used: Rolling walker (2 wheels) ?Transfers: Sit to/from Stand ?Sit to Stand: Supervision ?  ?  ?  ?  ?  ?General transfer comment: BUE assist required along with extra time and effort ?  ? ?Ambulation/Gait ?Ambulation/Gait assistance: Min guard ?Gait Distance (Feet): 120 Feet ?Assistive device: Rolling walker (2 wheels) ?Gait Pattern/deviations: Step-through pattern, Decreased step length - right, Decreased step length - left, Step-to pattern ?Gait velocity: decreased ?  ?  ?General Gait Details: Step-to pattern initially that gradually progressed to step-through with good stabiltiy throughout ? ? ?Stairs ?  ?  ?  ?  ?  ? ? ?Wheelchair Mobility ?  ? ?Modified Rankin (Stroke Patients Only) ?  ? ? ?  ?Balance Overall balance assessment: Needs assistance ?  ?Sitting balance-Leahy Scale: Normal ?  ?  ?Standing balance support: Bilateral upper extremity supported, During functional activity ?Standing balance-Leahy Scale: Good ?  ?  ?  ?  ?  ?  ?  ?  ?  ?  ?  ?  ?  ? ?  ?Cognition Arousal/Alertness: Awake/alert ?Behavior During Therapy: [facility] for tasks assessed/performed ?Overall Cognitive Status: Within Functional Limits for tasks assessed ?  ?  ?  ?  ?  ?  ?  ?  ?  ?  ?  ?  ?  ?  ?  ?  ?  ?  ?  ? ?  ?Exercises Other Exercises ?Other Exercises: Dynamic standing balance training with various foot positions and reaching outside BOS ? ?  ?General Comments   ?  ?  ? ?  Pertinent Vitals/Pain Pain Assessment ?Pain Assessment: 0-10 ?Pain Score: 5  ?Pain Location: R hip/thigh ?Pain Descriptors / Indicators: Aching, Sore ?Pain Intervention(s): Repositioned, Premedicated before session, Monitored during session, Ice applied  ? ? ?Home Living   ?  ?  ?  ?  ?  ?  ?  ?  ?  ?   ?  ?Prior Function    ?  ?  ?   ? ?PT Goals (current goals can now be found in the care plan section) Progress towards PT goals:  Progressing toward goals ? ?  ?Frequency ? ? ? 7X/week ? ? ? ?  ?PT Plan Current plan remains appropriate  ? ? ?Co-evaluation   ?  ?  ?  ?  ? ?  ?AM-PAC PT "6 Clicks" Mobility   ?Outcome Measure ? Help needed turning from your back to your side while in a flat bed without using bedrails?: A Little ?Help needed moving from lying on your back to sitting on the side of a flat bed without using bedrails?: A Little ?Help needed moving to and from a bed to a chair (including a wheelchair)?: A Little ?Help needed standing up from a chair using your arms (e.g., wheelchair or bedside chair)?: A Little ?Help needed to walk in hospital room?: A Little ?Help needed climbing 3-5 steps with a railing? : A Little ?6 Click Score: 18 ? ?  ?End of Session Equipment Utilized During Treatment: Gait belt ?Activity Tolerance: Patient tolerated treatment well ?Patient left: in chair;with call bell/phone within reach;with family/visitor present ?Nurse Communication: Mobility status;Weight bearing status ?PT Visit Diagnosis: Unsteadiness on feet (R26.81);Other abnormalities of gait and mobility (R26.89);Muscle weakness (generalized) (M62.81);Difficulty in walking, not elsewhere classified (R26.2);Pain ?Pain - Right/Left: Right ?Pain - part of body: Hip ?  ? ? ?Time: 8638-1771 ?PT Time Calculation (min) (ACUTE ONLY): 23 min ? ?Charges:  $Gait Training: 8-22 mins ?$Therapeutic Exercise: 8-22 mins          ?          ? ?D. Royetta Asal PT, DPT ?10/19/21, 1:35 PM ? ? ?

## 2021-10-19 NOTE — Discharge Summary (Signed)
Physician Discharge Summary  ?Patient ID: ?Richard Wiggins ?MRN: [id number] ?DOB/AGE: 12/01/1950 71 y.o. ? ?Admit date: 10/16/2021 ?Discharge date: 10/20/2021 ? ?Admission Diagnoses:  ?Infection of prosthetic total hip joint, initial encounter ([place]) [T84.59XA, [place] ? ? ?Discharge Diagnoses: ?Patient Active Problem List  ? Diagnosis Date Noted  ? Infection of prosthetic total hip joint, initial encounter ([place]) 10/16/2021  ? Dehydration   ? Epistaxis 09/23/2021  ? Anemia 09/23/2021  ? Lung cancer ([place]) 08/18/2021  ? Renal artery stenosis ([place]) 08/11/2021  ? CKD (chronic kidney disease), stage III ([place]) 03/11/2021  ? 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 Iron deficiency anemia 02/04/2021  ? Long term (current) use of anticoagulants 11/12/2020  ? Positive colorectal cancer screening using Cologuard test 05/22/2020  ? Goals of care, counseling/discussion 05/06/2020  ? Malignant neoplasm of upper lobe of left lung ([place]) 05/06/2020  ? Preventative health care 03/26/2020  ? Atherosclerosis of native arteries of extremity with intermittent claudication ([place]) 03/14/2020  ? Trigger middle finger of right hand 02/20/2020  ? Abdominal aortic aneurysm (AAA) without rupture ([place]) 02/20/2020  ? 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  ? Benign essential HTN 02/10/2020  ? Pre-diabetes 02/10/2020  ? BPH (benign prostatic hyperplasia) 02/09/2020  ? Pancytopenia ([place]) 02/09/2020  ? Weakness of lower extremity 02/01/2020  ? COPD (chronic obstructive pulmonary disease) ([place]) 02/01/2020  ? Chronic systolic CHF (congestive heart failure) ([place]) 02/01/2020  ? Depression 02/01/2020  ? A-fib ([place]) 02/01/2020  ? CVA (cerebral vascular accident) ([place]) 02/01/2020  ? Coronary artery disease 02/01/2020  ? Carotid artery stenosis 02/01/2020  ? HLD (hyperlipidemia) 02/01/2020  ? Carotid stenosis, asymptomatic, left 02/14/2018  ? S/P coronary artery stent placement 09/20/2017  ? Centrilobular emphysema ([place]) 06/10/2017  ?  Chronic back pain 06/10/2017  ? DOE (dyspnea on exertion) 06/10/2017  ? Elevated TSH 06/10/2017  ? Hematoma of groin 06/10/2017  ? PAD (peripheral artery disease) ([place]) 06/10/2017  ? Pulmonary embolism ([place]) 06/10/2017  ? Tobacco abuse 06/10/2017  ? Unintended weight loss 06/10/2017  ? AAA (abdominal aortic aneurysm) ([place]) 06/10/2017  ? PAF (paroxysmal atrial fibrillation) ([place]) 06/10/2017  ? Systolic CHF, chronic ([place]) 06/10/2017  ? Hypertension 05/31/2017  ? Other hyperlipidemia 05/31/2017  ? Hemiparesis affecting left side as late effect of cerebrovascular accident ([place]) 05/29/2017  ? Carotid artery stenosis, symptomatic, right 05/29/2017  ? Embolic stroke involving right middle cerebral artery ([place]) 05/28/2017  ? ? ?Past Medical History:  ?Diagnosis Date  ? AAA (abdominal aortic aneurysm) ([place]) 2020  ? a. 2020 U/S: 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 -plan medical managmment with tight bp control; b. 09/2020 Abd U/S: 4.1cm.  ? Anxiety   ? Aortic atherosclerosis ([place])   ? Benign essential HTN 02/10/2020  ? BPH (benign prostatic hyperplasia) 02/09/2020  ? Brain aneurysm   ? Cancer of upper lobe of left lung ([place]) 04/2020  ? a.) ENB/EBUS performed; Bx non-diagnostic. b.) presumed stage I NSCLC in the LUL; underwent SBRT (60 Gy over 5 fractions)  ? Carotid arterial disease ([place])   ? a. 05/2017 s/p R carotid endarterectomy following CVA.  ? 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 a. 2018 Echo: EF 30-35%; b. 02/2021 Echo: EF 55-60%, nl RV fxn. Triv MR. Asc Ao 34mm.  ? CKD (chronic kidney disease), stage III ([place])   ? Claudication [facility])   ? a. 09/2020 ABIs: R 0.91. L 0.99.  ? COPD (chronic obstructive pulmonary disease) ([place])   ? Coronary artery disease   ?  a. 06/2017 PCI ([place]): s/p PCI to the RCA. LAD 50, LCX 20.  ? Depression   ? 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 a. 02/2021 Asc Ao 92mm.  ? Emphysema of lung ([place])   ? History of blood transfusion   ? HLD (hyperlipidemia)   ?  IDA (iron deficiency anemia) 02/09/2020  ? 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  ? Inguinal hernia   ? right  ? Ischemic cardiomyopathy   ? Long term current use of anticoagulant   ? a.) apixaban  ? Marijuana use, continuous 02/10/2020  ? Nosebleed   ? Osteoporosis   ? PAF (paroxysmal atrial fibrillation) ([place])   ? a. 05/2017 Dx in setting of CVA. CHA2DS2VASc = 6-->Eliquis.  ? Pre-diabetes   ? Pulmonary emboli ([place]) 06/07/2017  ? Right lower lobe pulmonary embolism small segmental, multifocal multifocal pneumonia, mediastinal lymphadenopathy, moderate centrilobular emphysema  ? Seizures ([place])   ? Sigmoid diverticulosis   ? a.) CT 01/27/2021: severe  ? Stroke [facility])   ? a. 05/2017 - hospitalized in [place]->prolonged hospitalization in setting of R CEA, PE, and finding of RCA dzs on cath; b. Residual left sided weakness to arm and leg.  ? ?  ?Transfusion: 1 unit PRBC 10/18/2021 ?  ?Consultants (if any):  ? ?Discharged Condition: Improved ? ?Hospital Course: [name] is an 71 y.o. male who was admitted 10/16/2021 with a diagnosis of Infection of prosthetic total hip joint, initial encounter ([place]) and went to the operating room on 10/16/2021 and underwent the above named procedures.  ?  ?Surgeries: Procedure(s): ?IRRIGATION AND DEBRIDEMENT OF INFECTED TOTAL HIP on 10/16/2021 ?Patient tolerated the surgery well. Taken to PACU where she was stabilized and then transferred to the orthopedic floor. ? ?Started on Eliquis Foot pumps applied bilaterally at 80 mm. Heels elevated on bed with rolled towels. No evidence of DVT. Negative Homan. ?Physical therapy started on day #1 for gait training and transfer. OT started day #1 for ADL and assisted devices. ? ?Patient's foley was d/c on day #1. Patient was transfused 2 units of PRBC for acute post op blood loss anemia. Hgb stable post transfusion.  ID consulted and IV antibiotic recommendations were given.  ? ?On post op day #4 patient was stable and ready for discharge to home  with HHPT with 6 weeks of IV abx. ? ? ?He was given perioperative antibiotics:  ?Anti-infectives (From admission, onward)  ? ? Start     Dose/Rate Route Frequency Ordered Stop  ? 10/20/21 1400  DAPTOmycin (CUBICIN) 650 mg in sodium chloride 0.9 % IVPB       ? 650 mg ?126 mL/hr over 30 Minutes Intravenous Daily 10/20/21 0926    ? 10/20/21 0000  ceFEPIme 2 g in sodium chloride 0.9 % 100 mL  Status:  Discontinued       ? 2 g Intravenous Every 12 hours 10/20/21 0905 10/20/21   ? 10/20/21 0000  daptomycin (CUBICIN) IVPB  Status:  Discontinued       ? 675 mg Intravenous Every 24 hours 10/20/21 0905 10/20/21   ? 10/20/21 0000  daptomycin (CUBICIN) IVPB       ? 650 mg Intravenous Every 24 hours 10/20/21 1225 11/26/21 2359  ? 10/20/21 0000  ceFEPime (MAXIPIME) IVPB       ? 2 g Intravenous Every 12 hours 10/20/21 1225 11/26/21 2359  ? 10/19/21 2200  vancomycin (VANCOREADY) IVPB 1250 mg/250 mL  Status:  Discontinued       ?  1,250 mg ?166.7 mL/hr over 90 Minutes Intravenous Every 24 hours 10/19/21 1206 10/20/21 0933  ? 10/19/21 2200  ceFEPIme (MAXIPIME) 2 g in sodium chloride 0.9 % 100 mL IVPB       ? 2 g ?200 mL/hr over 30 Minutes Intravenous Every 12 hours 10/19/21 1552    ? 10/17/21 1000  vancomycin (VANCOREADY) IVPB 1750 mg/350 mL  Status:  Discontinued       ? 1,750 mg ?175 mL/hr over 120 Minutes Intravenous Every 24 hours 10/16/21 1630 10/19/21 1155  ? 10/16/21 2000  ceFEPIme (MAXIPIME) 2 g in sodium chloride 0.9 % 100 mL IVPB  Status:  Discontinued       ? 2 g ?200 mL/hr over 30 Minutes Intravenous Every 8 hours 10/16/21 1630 10/19/21 1552  ? 10/16/21 1800  vancomycin (VANCOREADY) IVPB 1750 mg/350 mL       ? 1,750 mg ?175 mL/hr over 120 Minutes Intravenous  Once 10/16/21 1630 10/17/21 0310  ? 10/16/21 1421  vancomycin (VANCOCIN) IVPB 1000 mg/200 mL premix  Status:  Discontinued       ? 1,000 mg ?200 mL/hr over 60 Minutes Intravenous 30 min pre-op 10/16/21 1421 10/16/21 1630  ? 10/16/21 1315  vancomycin (VANCOCIN) powder   Status:  Discontinued       ?   As needed 10/16/21 1250 10/16/21 1353  ? 10/16/21 1013  ceFAZolin (ANCEF) 2-4 GM/100ML-% IVPB       ?Note to Pharmacy: Doreen Salvage J: cabinet override  ?    10/16/21 10

## 2021-10-19 NOTE — Progress Notes (Addendum)
Per Dr Delaine Lame, the PICC is not needed at this time due to the patient having an implanted port. ?

## 2021-10-20 DIAGNOSIS — Z96649 Presence of unspecified artificial hip joint: Secondary | ICD-10-CM | POA: Diagnosis not present

## 2021-10-20 DIAGNOSIS — T8459XA Infection and inflammatory reaction due to other internal joint prosthesis, initial encounter: Secondary | ICD-10-CM | POA: Diagnosis not present

## 2021-10-20 LAB — C-REACTIVE PROTEIN: CRP: 16.4 mg/dL — ABNORMAL HIGH (ref <1.0)

## 2021-10-20 LAB — SEDIMENTATION RATE: Sed Rate: 128 mm/hr — ABNORMAL HIGH (ref 0–20)

## 2021-10-20 MED ORDER — ATORVASTATIN CALCIUM 20 MG PO TABS: 20.0000 mg | ORAL_TABLET | Freq: Every day | ORAL | Status: DC

## 2021-10-20 MED ORDER — SODIUM CHLORIDE 0.9 % IV SOLN: 2.0000 g | Freq: Two times a day (BID) | INTRAVENOUS | 0 refills | Status: DC

## 2021-10-20 MED ORDER — HEPARIN SOD (PORK) LOCK FLUSH 100 UNIT/ML IV SOLN: 500.0000 [IU] | Freq: Every day | INTRAVENOUS | Status: DC | PRN

## 2021-10-20 MED ORDER — CHLORHEXIDINE GLUCONATE CLOTH 2 % EX PADS
6.0000 | MEDICATED_PAD | Freq: Every day | CUTANEOUS | Status: DC
  Administered 2021-10-20: 6 via TOPICAL

## 2021-10-20 MED ORDER — SODIUM CHLORIDE 0.9% FLUSH: 3.0000 mL | INTRAVENOUS | Status: DC | PRN

## 2021-10-20 MED ORDER — CHLORHEXIDINE GLUCONATE CLOTH 2 % EX PADS: 6.0000 | MEDICATED_PAD | Freq: Every day | CUTANEOUS | Status: DC

## 2021-10-20 MED ORDER — CEFEPIME IV (FOR PTA / DISCHARGE USE ONLY): 2.0000 g | Freq: Two times a day (BID) | INTRAVENOUS | 0 refills | Status: DC

## 2021-10-20 MED ORDER — SODIUM CHLORIDE 0.9% FLUSH: 10.0000 mL | INTRAVENOUS | Status: DC | PRN

## 2021-10-20 MED ORDER — DAPTOMYCIN IV (FOR PTA / DISCHARGE USE ONLY): 650.0000 mg | INTRAVENOUS | 0 refills | Status: DC

## 2021-10-20 MED ORDER — SODIUM CHLORIDE 0.9 % IV SOLN
650.0000 mg | Freq: Every day | INTRAVENOUS | Status: DC
  Administered 2021-10-20: 650 mg via INTRAVENOUS
  Filled 2021-10-20: qty 13

## 2021-10-20 MED ORDER — HEPARIN SOD (PORK) LOCK FLUSH 100 UNIT/ML IV SOLN
250.0000 [IU] | INTRAVENOUS | Status: DC | PRN
  Filled 2021-10-20: qty 5

## 2021-10-20 MED ORDER — DAPTOMYCIN IV (FOR PTA / DISCHARGE USE ONLY): 675.0000 mg | INTRAVENOUS | 0 refills | Status: DC

## 2021-10-20 MED ORDER — HEPARIN SOD (PORK) LOCK FLUSH 100 UNIT/ML IV SOLN
500.0000 [IU] | Freq: Every day | INTRAVENOUS | Status: DC | PRN
  Filled 2021-10-20: qty 5

## 2021-10-20 MED ORDER — HEPARIN SOD (PORK) LOCK FLUSH 100 UNIT/ML IV SOLN: 250.0000 [IU] | INTRAVENOUS | Status: DC | PRN

## 2021-10-20 NOTE — Progress Notes (Signed)
Physical Therapy Treatment ?Patient Details ?Name: Richard Wiggins ?MRN: [id number] ?DOB: 1950-09-13 ?Today's Date: 10/20/2021 ? ? ?History of Present Illness Patient is a 71 year old male admitted with Right THA Infection- Now Irrigation and drainage of right Hip. Patient has past medical history significant for Stage 3 Lung Cancer and previous Right THA in 2008 while living in [place]. Patient was most recently admitted to hospital on 09/20/2021 due to dx: Hernia ? ?  ?PT Comments  ? ? Pt was pleasant and motivated to participate during the session and put forth good effort throughout. Pt made good progress towards goals this session.  Amb tolerance improved with pt able to amb 200 feet with slow cadence but without LOB and with SpO2 and HR WNL on room air.  Good stability during dynamic standing balance training including reaching outside BOS in near tandem position.  Pt will benefit from HHPT upon discharge to safely address deficits listed in patient problem list for decreased caregiver assistance and eventual return to PLOF. ?  ?   ?Recommendations for follow up therapy are one component of a multi-disciplinary discharge planning process, led by the attending physician.  Recommendations may be updated based on patient status, additional functional criteria and insurance authorization. ? ?Follow Up Recommendations ? Home health PT ?  ?  ?Assistance Recommended at Discharge Intermittent Supervision/Assistance  ?Patient can return home with the following A little help with walking and/or transfers;A little help with bathing/dressing/bathroom;Assistance with cooking/housework;Direct supervision/assist for medications management;Assist for transportation;Help with stairs or ramp for entrance ?  ?Equipment Recommendations ? None recommended by PT  ?  ?Recommendations for Other Services   ? ? ?  ?Precautions / Restrictions Precautions ?Precautions: Fall ?Restrictions ?Weight Bearing Restrictions: Yes ?RLE Weight  Bearing: Weight bearing as tolerated  ?  ? ?Mobility ? Bed Mobility ?  ?  ?  ?  ?  ?  ?  ?General bed mobility comments: NT, in recliner ?  ? ?Transfers ?Overall transfer level: Needs assistance ?Equipment used: Rolling walker (2 wheels) ?Transfers: Sit to/from Stand ?Sit to Stand: Supervision ?  ?  ?  ?  ?  ?General transfer comment: BUE assist required along with min extra time and effort but good stability throughout ?  ? ?Ambulation/Gait ?Ambulation/Gait assistance: Supervision ?Gait Distance (Feet): 200 Feet ?Assistive device: Rolling walker (2 wheels) ?Gait Pattern/deviations: Step-through pattern, Decreased step length - right, Decreased step length - left ?Gait velocity: decreased ?  ?  ?General Gait Details: Slow cadence but steady with improved activity tolerance this session; SpO2 and HR WNL on room air ? ? ?Stairs ?  ?  ?  ?  ?  ? ? ?Wheelchair Mobility ?  ? ?Modified Rankin (Stroke Patients Only) ?  ? ? ?  ?Balance Overall balance assessment: Needs assistance ?  ?Sitting balance-Leahy Scale: Normal ?  ?  ?Standing balance support: Bilateral upper extremity supported, During functional activity ?Standing balance-Leahy Scale: Good ?  ?  ?  ?  ?  ?  ?  ?  ?  ?  ?  ?  ?  ? ?  ?Cognition Arousal/Alertness: Awake/alert ?Behavior During Therapy: [facility] for tasks assessed/performed ?Overall Cognitive Status: Within Functional Limits for tasks assessed ?  ?  ?  ?  ?  ?  ?  ?  ?  ?  ?  ?  ?  ?  ?  ?  ?  ?  ?  ? ?  ?Exercises Total Joint Exercises ?Ankle Circles/Pumps: AROM,  Strengthening, Both, 10 reps ?Quad Sets: Strengthening, Both, 10 reps ?Gluteal Sets: Strengthening, Both, 10 reps ?Long [employer]: Strengthening, Both, 10 reps ?Other Exercises ?Other Exercises: Dynamic standing balance training with various foot positions and reaching outside BOS ? ?  ?General Comments   ?  ?  ? ?Pertinent Vitals/Pain Pain Assessment ?Pain Assessment: 0-10 ?Pain Score: 5  ?Pain Location: R hip/thigh ?Pain Descriptors /  Indicators: Aching, Sore ?Pain Intervention(s): Repositioned, Premedicated before session, Monitored during session  ? ? ?Home Living   ?  ?  ?  ?  ?  ?  ?  ?  ?  ?   ?  ?Prior Function    ?  ?  ?   ? ?PT Goals (current goals can now be found in the care plan section) Progress towards PT goals: Progressing toward goals ? ?  ?Frequency ? ? ? 7X/week ? ? ? ?  ?PT Plan Current plan remains appropriate  ? ? ?Co-evaluation   ?  ?  ?  ?  ? ?  ?AM-PAC PT "6 Clicks" Mobility   ?Outcome Measure ? Help needed turning from your back to your side while in a flat bed without using bedrails?: A Little ?Help needed moving from lying on your back to sitting on the side of a flat bed without using bedrails?: A Little ?Help needed moving to and from a bed to a chair (including a wheelchair)?: A Little ?Help needed standing up from a chair using your arms (e.g., wheelchair or bedside chair)?: A Little ?Help needed to walk in hospital room?: A Little ?Help needed climbing 3-5 steps with a railing? : A Little ?6 Click Score: 18 ? ?  ?End of Session Equipment Utilized During Treatment: Gait belt ?Activity Tolerance: Patient tolerated treatment well ?Patient left: in chair;with call bell/phone within reach ?Nurse Communication: Mobility status;Weight bearing status ?PT Visit Diagnosis: Unsteadiness on feet (R26.81);Other abnormalities of gait and mobility (R26.89);Muscle weakness (generalized) (M62.81);Difficulty in walking, not elsewhere classified (R26.2);Pain ?Pain - Right/Left: Right ?Pain - part of body: Hip ?  ? ? ?Time: 1610-9604 ?PT Time Calculation (min) (ACUTE ONLY): 25 min ? ?Charges:  $Gait Training: 8-22 mins ?$Therapeutic Exercise: 8-22 mins          ?          ? ?D. Royetta Asal PT, DPT ?10/20/21, 1:22 PM ? ? ?

## 2021-10-20 NOTE — Progress Notes (Signed)
? ?Date of Admission:  10/16/2021    ? ?ID: Richard Wiggins is a 71 y.o. male  ?Principal Problem: ?  Infection of prosthetic total hip joint, initial encounter ([place]) ? ? ? ?Subjective: ?Pt feeling better ?Says he has more tremors left habd ?No fever ?Pain better ? ?Medications:  ? apixaban  5 mg Oral BID  ? aspirin  81 mg Oral Daily  ? atorvastatin  40 mg Oral QHS  ? carvedilol  12.5 mg Oral BID WC  ? docusate sodium  100 mg Oral BID  ? lamoTRIgine  100 mg Oral BID  ? lidocaine  1 patch Transdermal Q24H  ? mometasone-formoterol  2 puff Inhalation BID  ? multivitamin with minerals  1 tablet Oral Daily  ? oxyCODONE  10 mg Oral Q12H  ? pantoprazole  40 mg Oral Daily  ? polyethylene glycol  17 g Oral Daily  ? sacubitril-valsartan  1 tablet Oral BID  ? umeclidinium bromide  1 puff Inhalation Daily  ? ? ?Objective: ?Vital signs in last 24 hours: ?Temp:  [97.7 ?F (36.5 ?C)-98.3 ?F (36.8 ?C)] 97.7 ?F (36.5 ?C) (04/10 2032) ?Pulse Rate:  [70-76] 70 (04/10 2032) ?Resp:  [16-17] 17 (04/10 2032) ?BP: (130-156)/(65-79) 143/74 (04/10 2032) ?SpO2:  [95 %-100 %] 100 % (04/10 2032) ? ?PHYSICAL EXAM:  ?General: Alert, cooperative, no distress, appears stated age. pale ?Head: Normocephalic, without obvious abnormality, atraumatic. ?Eyes: Conjunctivae clear, anicteric sclerae. Pupils are equal ?ENT Nares normal. No drainage or sinus tenderness. ?Lips, mucosa, and tongue normal. No Thrush ?Neck: Supple,. ?Lungs: Clear to auscultation bilaterally. No Wheezing or Rhonchi. No rales. ?Heart: Regular rate and rhythm, no murmur, rub or gallop. ?Abdomen: Soft, non-tender,not distended. Bowel sounds normal. No masses ?Extremities: atraumatic, no cyanosis. No edema. No clubbing ?Skin: No rashes or lesions. Or bruising ?Lymph: Cervical, supraclavicular normal. ?Neurologic:rt hemparesis ?PORT in place ? ?Lab Results ?Recent Labs  ?  10/17/21 ?0354 10/17/21 ?2059 10/18/21 ?6568 10/19/21 ?0358  ?WBC 6.3  --  6.3 6.1  ?HGB 5.5*   < > 7.7* 8.3*  ?HCT  17.4*   < > 23.1* 25.9*  ?NA 132*  --   --   --   ?K 4.4  --   --   --   ?CL 99  --   --   --   ?CO2 26  --   --   --   ?BUN 18  --   --   --   ?CREATININE 1.03  --   --  1.04  ? < > = values in this interval not displayed.  ? ? ?Microbiology: ?Synovial fluid culture NG ?Studies/Results: ?[place] EKG SITE RITE ? ?Result Date: 10/19/2021 ?If [employer] not attached, placement could not be confirmed due to current cardiac rhythm.  ? ? ?Assessment/Plan: ?71 year old male presenting with pain left hip with the anterior side aspect for the past 3 weeks. ?CT recent showed right hip joint effusion ? ?Right prosthetic hip joint ?Infection of the prosthetic joint ?Underwent incision and washout of the hip ?Multiple cultures have been negative ?Currently on Vanco and cefepime ?We will change vancomycin to daptomycin for ease of administration. ?Cefepime dose will be reduced to every 12 as he has history of CKD with creatinine clearance being less than 60 in the past ?Patient will need 6 weeks of IV antibiotics.  Discussed the management with the patient and his wife. ?We will do daptomycin and cefepime.  The Dapto was inserted of vancomycin in order to  avoid close drug monitoring. ?While on daptomycin we will check CPK weekly along with CMP and CBC.  Following IV antibiotic may need p.o. ? ?Non-small cell CA lung.  Status post radiation.  Received 2 doses of chemo ? ?Anemia ? ?CKD ? ?History of cerebral aneurysm with clipping ? ?Hyperlipidemia on atorvastatin.  While on daptomycin he will reduce atorvastatin to 20 mg from the current dose of 40 mg. ? ?CAD status post stent ?CHF on furosemide Coreg and Entresto ? ?COPD ? ?Discussed the management with the patient and his wife. ? ? ?OPAT ORDERS ?Diagnosis: ?Rt PKI of the rt hip ? ?Baseline Creatinine 1.15 ? ?Culture Result: Neg ? ?Allergies  ?Allergen Reactions  ? Tape Rash  ?  plastic  ? Wound Dressing Adhesive Itching and Rash  ?  Gets stuck to skin, makes wound spread   ? ? ?OPAT Orders ?Discharge antibiotics: ?Daptomycin 675 mg IVPB Every 24 hrs ?Cefepime 2 grams IV Q 12 ?Duration: ?6 weeks ?End Date: ?11/26/21 ? ?PORTCare Per Protocol: ? ?Labs weekly while on IV antibiotics: ?_X_ CBC with differential ?_X_ CMP ?_X_ ESR ?_X_ CPK ? ?_ ?Fax weekly labs to [phone] ? ?Clinic Follow Up Appt: 11/10/21 at 11.30 Am ? ? ?Call [phone] with any questions or critical values ?  ?  ?

## 2021-10-20 NOTE — Telephone Encounter (Signed)
I can do it virtual but he needs to be discharged by tomorrow. Otherwise I can do it on thursday

## 2021-10-20 NOTE — Progress Notes (Addendum)
[facility]) Hospital Liaison note: ? ?This is a pending outpatient-based Palliative Care patient. Will continue to follow for disposition. ? ?Please call with any outpatient palliative questions or concerns. ? ?Thank you, ?Lorelee Market, LPN ?[facility] Hospital Liaison ?[phone] ?

## 2021-10-20 NOTE — Progress Notes (Signed)
? ?Date of Admission:  10/16/2021    ? ?ID: Richard Wiggins is a 71 y.o. male  ?Principal Problem: ?  Infection of prosthetic total hip joint, initial encounter ([place]) ? ? ? ?Subjective: ?Doing better ?Waiting to be educated on IV antibiotic infusion ?Wife at bed side ? ?Medications:  ? apixaban  5 mg Oral BID  ? aspirin  81 mg Oral Daily  ? atorvastatin  20 mg Oral QHS  ? carvedilol  12.5 mg Oral BID WC  ? docusate sodium  100 mg Oral BID  ? lamoTRIgine  100 mg Oral BID  ? lidocaine  1 patch Transdermal Q24H  ? mometasone-formoterol  2 puff Inhalation BID  ? multivitamin with minerals  1 tablet Oral Daily  ? oxyCODONE  10 mg Oral Q12H  ? pantoprazole  40 mg Oral Daily  ? polyethylene glycol  17 g Oral Daily  ? sacubitril-valsartan  1 tablet Oral BID  ? umeclidinium bromide  1 puff Inhalation Daily  ? ? ?Objective: ?Vital signs in last 24 hours: ?Temp:  [97.7 ?F (36.5 ?C)-98.3 ?F (36.8 ?C)] 98.1 ?F (36.7 ?C) (04/11 1155) ?Pulse Rate:  [70-82] 82 (04/11 1155) ?Resp:  [15-18] 15 (04/11 1155) ?BP: (110-162)/(60-99) 110/60 (04/11 1155) ?SpO2:  [90 %-100 %] 95 % (04/11 1155) ? ?PHYSICAL EXAM:  ?General: Alert, cooperative, no distress, appears stated age.  ?Lungs: Clear to auscultation bilaterally. No Wheezing or Rhonchi. No rales. ?Heart: Regular rate and rhythm, no murmur, rub or gallop. ?Abdomen: Soft, non-tender,not distended. Bowel sounds normal. No masses ?Extremities: rt groin- vac dressing ? ? ?Lab Results ?Recent Labs  ?  10/18/21 ?0427 10/19/21 ?0358  ?WBC 6.3 6.1  ?HGB 7.7* 8.3*  ?HCT 23.1* 25.9*  ?CREATININE  --  1.04  ? ?Liver Panel ?No results for input(s): PROT, ALBUMIN, AST, ALT, ALKPHOS, BILITOT, BILIDIR, IBILI in the last 72 hours. ?Sedimentation Rate ?Recent Labs  ?  10/20/21 ?0600  ?ESRSEDRATE 128*  ? ?C-Reactive Protein ?Recent Labs  ?  10/20/21 ?0600  ?CRP 16.4*  ? ? ?Microbiology: ?Synovial fluid culture neg ?Studies/Results: ?[place] EKG SITE RITE ? ?Result Date: 10/19/2021 ?If [employer] not  attached, placement could not be confirmed due to current cardiac rhythm.  ? ? ?Assessment/Plan: ? ?71 year old male presenting with pain left hip with the anterior side aspect for the past 3 weeks. ?CT recent showed right hip joint effusion ? ?Right prosthetic hip joint ?Infection of the prosthetic joint ?Underwent incision and washout of the hip ?Multiple cultures have been negative ?on dapto and cefepime ?Patient will need 6 weeks of IV antibiotics.  Discussed the management with the patient and his wife. ?While on daptomycin we will check CPK weekly along with CMP and CBC.  Following IV antibiotic may need p.o. ? ?Non-small cell CA lung.  Status post radiation.  Received 2 doses of chemo ? ?Anemia ? ?CKD ? ?History of cerebral aneurysm with clipping ? ?Hyperlipidemia on atorvastatin.  While on daptomycin he will reduce atorvastatin to 20 mg from the current dose of 40 mg. ? ?CAD status post stent ?CHF on furosemide Coreg and Entresto ? ?COPD ?  ?Discussed the management with the patient and his wife. ?  ?  ?OPAT ORDERS ?Diagnosis: ?Rt PKI of the rt hip ?  ?Baseline Creatinine 1.15 ?  ?Culture Result: Neg ?  ?     ?Allergies  ?Allergen Reactions  ? Tape Rash  ?    plastic  ? Wound Dressing Adhesive Itching and Rash  ?  Gets stuck to skin, makes wound spread  ?  ?  ?OPAT Orders ?Discharge antibiotics: ?Daptomycin 650 mg IVPB Every 24 hrs ?Cefepime 2 grams IV Q 12 ?Duration: ?6 weeks ?End Date: ?11/26/21 ?  ?PORTCare Per Protocol: ?  ?Labs weekly while on IV antibiotics: ?_X_ CBC with differential ?_X_ CMP ?_X_ ESR ?_X_ CPK ?  ?_ ?Fax weekly labs to [phone] ?  ?Clinic Follow Up Appt: 11/10/21 at 11.30 Am ? ?  ?

## 2021-10-20 NOTE — Plan of Care (Signed)
DISCHARGE NOTE:  ? ?Pt Left PIV removed, pt going home with port. Pt and wife were educated on medication administration via port and daily care. Pt will have a home nurse starting tomorrow morning to help out with medications/care, as well. Patients belongings sent home with him. Nurse wheeled pt out, Wife taking pt home. ?

## 2021-10-20 NOTE — Progress Notes (Signed)
PHARMACY CONSULT NOTE FOR: ? ?OUTPATIENT  PARENTERAL ANTIBIOTIC THERAPY (OPAT) ? ?Indication: septic total joint ?Regimen: Daptomycin 650 mg IV q24h ?Cefepime 2g IV q12h ?End date: 11/26/21 ? ?IV antibiotic discharge orders are pended. ?To discharging provider:  please sign these orders via discharge navigator,  ?Select New Orders & click on the button choice - Manage This Unsigned Work.  ?  ? ?Thank you for allowing pharmacy to be a part of this patient's care. ? ?Lestine Box, PharmD ?PGY2 Infectious Diseases Pharmacy Resident ?  ?Please check [email or web address] for unit-specific pharmacy phone numbers  ?

## 2021-10-20 NOTE — Progress Notes (Signed)
? ?Subjective: ?4 Days Post-Op Procedure(s) (LRB): ?IRRIGATION AND DEBRIDEMENT OF INFECTED TOTAL HIP (Right) ?Patient reports pain as mild.   ?Patient is well, and has had no acute complaints or problems ?Denies any CP, SOB, ABD pain. ?We will continue therapy today.  ?Plan is to go Home after hospital stay. ? ?Objective: ?Vital signs in last 24 hours: ?Temp:  [97.7 ?F (36.5 ?C)-98.3 ?F (36.8 ?C)] 98.3 ?F (36.8 ?C) (04/11 0802) ?Pulse Rate:  [70-76] 71 (04/11 0802) ?Resp:  [16-18] 18 (04/11 0802) ?BP: (130-162)/(65-99) 153/67 (04/11 0802) ?SpO2:  [90 %-100 %] 96 % (04/11 0802) ? ?Intake/Output from previous day: ?04/10 0701 - 04/11 0700 ?In: 69 [P.O.:480; IV Piggyback:350] ?Out: 900 [Urine:900] ?Intake/Output this shift: ?No intake/output data recorded. ? ?Recent Labs  ?  10/17/21 ?2059 10/18/21 ?5102 10/19/21 ?0358  ?HGB 7.6* 7.7* 8.3*  ? ?Recent Labs  ?  10/18/21 ?0427 10/19/21 ?0358  ?WBC 6.3 6.1  ?RBC 2.68* 2.93*  ?HCT 23.1* 25.9*  ?PLT 237 254  ? ?Recent Labs  ?  10/19/21 ?0358  ?CREATININE 1.04  ? ?No results for input(s): LABPT, INR in the last 72 hours. ? ?EXAM ?General - Patient is Alert, Appropriate, and Oriented ?Extremity - Neurovascular intact ?Sensation intact distally ?Intact pulses distally ?Dorsiflexion/Plantar flexion intact ?No cellulitis present ?Compartment soft ?Dressing - dressing C/D/I and no drainage, provena intact with out drainage ?Motor Function - intact, moving foot and toes well on exam.  ? ?Past Medical History:  ?Diagnosis Date  ? AAA (abdominal aortic aneurysm) ([place]) 2020  ? a. 2020 U/S: 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 -plan medical managmment with tight bp control; b. 09/2020 Abd U/S: 4.1cm.  ? Anxiety   ? Aortic atherosclerosis ([place])   ? Benign essential HTN 02/10/2020  ? BPH (benign prostatic hyperplasia) 02/09/2020  ? Brain aneurysm   ? Cancer of upper lobe of left lung ([place]) 04/2020  ? a.) ENB/EBUS performed; Bx non-diagnostic. b.) presumed stage I NSCLC in the LUL; underwent SBRT  (60 Gy over 5 fractions)  ? Carotid arterial disease ([place])   ? a. 05/2017 s/p R carotid endarterectomy following CVA.  ? 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 a. 2018 Echo: EF 30-35%; b. 02/2021 Echo: EF 55-60%, nl RV fxn. Triv MR. Asc Ao 26mm.  ? CKD (chronic kidney disease), stage III ([place])   ? Claudication [facility])   ? a. 09/2020 ABIs: R 0.91. L 0.99.  ? COPD (chronic obstructive pulmonary disease) ([place])   ? Coronary artery disease   ? a. 06/2017 PCI ([place]): s/p PCI to the RCA. LAD 50, LCX 20.  ? Depression   ? 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 a. 02/2021 Asc Ao 62mm.  ? Emphysema of lung ([place])   ? History of blood transfusion   ? HLD (hyperlipidemia)   ? IDA (iron deficiency anemia) 02/09/2020  ? 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  ? Inguinal hernia   ? right  ? Ischemic cardiomyopathy   ? Long term current use of anticoagulant   ? a.) apixaban  ? Marijuana use, continuous 02/10/2020  ? Nosebleed   ? Osteoporosis   ? PAF (paroxysmal atrial fibrillation) ([place])   ? a. 05/2017 Dx in setting of CVA. CHA2DS2VASc = 6-->Eliquis.  ? Pre-diabetes   ? Pulmonary emboli ([place]) 06/07/2017  ? Right lower lobe pulmonary embolism small segmental, multifocal multifocal pneumonia, mediastinal lymphadenopathy, moderate centrilobular emphysema  ? Seizures ([place])   ? Sigmoid  diverticulosis   ? a.) CT 01/27/2021: severe  ? Stroke [facility])   ? a. 05/2017 - hospitalized in [place]->prolonged hospitalization in setting of R CEA, PE, and finding of RCA dzs on cath; b. Residual left sided weakness to arm and leg.  ? ? ?Assessment/Plan:   ?4 Days Post-Op Procedure(s) (LRB): ?IRRIGATION AND DEBRIDEMENT OF INFECTED TOTAL HIP (Right) ?Principal Problem: ?  Infection of prosthetic total hip joint, initial encounter ([place]) ? ?Estimated body mass index is 26.19 kg/m? as calculated from the following: ?  Height as of this encounter: 5\' 10"  (1.778 m). ?  Weight as of this encounter: 82.8 kg. ?Advance  diet ?Up with therapy, pain and mobility much improved post I&D ?Acute post op blood loss anemia  - Hgb 8.3, trending up. S/p 1 unit PRBC 4/9 ?Continue with IV abx ?Appreciate ID input, 6 weeks of IV abx ?CM to assist with discharge to home with HHPT today. ? ?Follow up wit [place] ortho in 2 weeks ? ? ?DVT Prophylaxis - TED hose and Eliquis , aspirin ?Weight-Bearing as tolerated to right leg ? ? ?T. Rachelle Hora, PA-C ?[place] ?10/20/2021, 8:56 AM ?  ?

## 2021-10-21 ENCOUNTER — Emergency Department
Admission: EM | Admit: 2021-10-21 | Discharge: 2021-10-21 | Disposition: A | Discharge status: Home / Self Care | Payer: No Typology Code available for payment source | Attending: Emergency Medicine | Admitting: Emergency Medicine

## 2021-10-21 ENCOUNTER — Inpatient Hospital Stay: Payer: No Typology Code available for payment source | Admitting: Oncology

## 2021-10-21 ENCOUNTER — Other Ambulatory Visit: Payer: Self-pay

## 2021-10-21 ENCOUNTER — Emergency Department: Payer: No Typology Code available for payment source

## 2021-10-21 ENCOUNTER — Encounter: Payer: Self-pay | Admitting: Orthopedic Surgery

## 2021-10-21 ENCOUNTER — Other Ambulatory Visit: Payer: Self-pay | Admitting: Radiation Oncology

## 2021-10-21 ENCOUNTER — Inpatient Hospital Stay: Payer: No Typology Code available for payment source

## 2021-10-21 DIAGNOSIS — Z20822 Contact with and (suspected) exposure to covid-19: Secondary | ICD-10-CM | POA: Insufficient documentation

## 2021-10-21 DIAGNOSIS — I509 Heart failure, unspecified: Secondary | ICD-10-CM | POA: Insufficient documentation

## 2021-10-21 DIAGNOSIS — R531 Weakness: Secondary | ICD-10-CM | POA: Diagnosis not present

## 2021-10-21 DIAGNOSIS — I130 Hypertensive heart and chronic kidney disease with heart failure and stage 1 through stage 4 chronic kidney disease, or unspecified chronic kidney disease: Secondary | ICD-10-CM | POA: Insufficient documentation

## 2021-10-21 DIAGNOSIS — I2510 Atherosclerotic heart disease of native coronary artery without angina pectoris: Secondary | ICD-10-CM | POA: Insufficient documentation

## 2021-10-21 DIAGNOSIS — Z7901 Long term (current) use of anticoagulants: Secondary | ICD-10-CM | POA: Diagnosis not present

## 2021-10-21 DIAGNOSIS — J449 Chronic obstructive pulmonary disease, unspecified: Secondary | ICD-10-CM | POA: Diagnosis not present

## 2021-10-21 DIAGNOSIS — R110 Nausea: Secondary | ICD-10-CM | POA: Insufficient documentation

## 2021-10-21 DIAGNOSIS — Z86711 Personal history of pulmonary embolism: Secondary | ICD-10-CM | POA: Insufficient documentation

## 2021-10-21 DIAGNOSIS — K5900 Constipation, unspecified: Secondary | ICD-10-CM | POA: Insufficient documentation

## 2021-10-21 DIAGNOSIS — Z743 Need for continuous supervision: Secondary | ICD-10-CM | POA: Diagnosis not present

## 2021-10-21 DIAGNOSIS — N189 Chronic kidney disease, unspecified: Secondary | ICD-10-CM | POA: Diagnosis not present

## 2021-10-21 LAB — AEROBIC/ANAEROBIC CULTURE W GRAM STAIN (SURGICAL/DEEP WOUND)
Culture: NO GROWTH
Culture: NO GROWTH
Culture: NO GROWTH
Culture: NO GROWTH
Gram Stain: NONE SEEN
Gram Stain: NONE SEEN
Gram Stain: NONE SEEN
Gram Stain: NONE SEEN

## 2021-10-21 LAB — RESP PANEL BY RT-PCR (FLU A&B, COVID) ARPGX2
Influenza A by PCR: NEGATIVE
Influenza B by PCR: NEGATIVE
SARS Coronavirus 2 by RT PCR: NEGATIVE

## 2021-10-21 LAB — COMPREHENSIVE METABOLIC PANEL
ALT: 27 U/L (ref 0–44)
AST: 36 U/L (ref 15–41)
Albumin: 2.1 g/dL — ABNORMAL LOW (ref 3.5–5.0)
Alkaline Phosphatase: 87 U/L (ref 38–126)
Anion gap: 10 (ref 5–15)
BUN: 20 mg/dL (ref 8–23)
CO2: 25 mmol/L (ref 22–32)
Calcium: 8.6 mg/dL — ABNORMAL LOW (ref 8.9–10.3)
Chloride: 97 mmol/L — ABNORMAL LOW (ref 98–111)
Creatinine, Ser: 1.22 mg/dL (ref 0.61–1.24)
GFR, Estimated: >60 mL/min (ref >60)
Glucose, Bld: 118 mg/dL — ABNORMAL HIGH (ref 70–99)
Potassium: 4.1 mmol/L (ref 3.5–5.1)
Sodium: 132 mmol/L — ABNORMAL LOW (ref 135–145)
Total Bilirubin: 0.9 mg/dL (ref 0.3–1.2)
Total Protein: 6.7 g/dL (ref 6.5–8.1)

## 2021-10-21 LAB — CBC WITH DIFFERENTIAL/PLATELET
Abs Immature Granulocytes: 0.14 10*3/uL — ABNORMAL HIGH (ref 0.00–0.07)
Basophils Absolute: 0 10*3/uL (ref 0.0–0.1)
Basophils Relative: 0 %
Eosinophils Absolute: 0 10*3/uL (ref 0.0–0.5)
Eosinophils Relative: 1 %
HCT: 22.9 % — ABNORMAL LOW (ref 39.0–52.0)
Hemoglobin: 7.2 g/dL — ABNORMAL LOW (ref 13.0–17.0)
Immature Granulocytes: 2 %
Lymphocytes Relative: 9 %
Lymphs Abs: 0.6 10*3/uL — ABNORMAL LOW (ref 0.7–4.0)
MCH: 27.9 pg (ref 26.0–34.0)
MCHC: 31.4 g/dL (ref 30.0–36.0)
MCV: 88.8 fL (ref 80.0–100.0)
Monocytes Absolute: 0.5 10*3/uL (ref 0.1–1.0)
Monocytes Relative: 8 %
Neutro Abs: 5.3 10*3/uL (ref 1.7–7.7)
Neutrophils Relative %: 80 %
Platelets: 215 10*3/uL (ref 150–400)
RBC: 2.58 MIL/uL — ABNORMAL LOW (ref 4.22–5.81)
RDW: 15.8 % — ABNORMAL HIGH (ref 11.5–15.5)
WBC: 6.6 10*3/uL (ref 4.0–10.5)
nRBC: 0 % (ref 0.0–0.2)

## 2021-10-21 LAB — BRAIN NATRIURETIC PEPTIDE: B Natriuretic Peptide: 175.5 pg/mL — ABNORMAL HIGH (ref 0.0–100.0)

## 2021-10-21 LAB — TROPONIN I (HIGH SENSITIVITY): Troponin I (High Sensitivity): 12 ng/L (ref <18)

## 2021-10-21 MED ORDER — POLYETHYLENE GLYCOL 3350 17 G PO PACK: 17.0000 g | PACK | Freq: Every day | ORAL | Status: DC

## 2021-10-21 MED ORDER — SACUBITRIL-VALSARTAN 49-51 MG PO TABS: 0.5000 | ORAL_TABLET | Freq: Two times a day (BID) | ORAL | Status: DC

## 2021-10-21 MED ORDER — APIXABAN 5 MG PO TABS
5.0000 mg | ORAL_TABLET | Freq: Two times a day (BID) | ORAL | Status: DC
Start: 2021-10-21 — End: 2021-10-21

## 2021-10-21 MED ORDER — OXYCODONE HCL ER 10 MG PO T12A
10.0000 mg | EXTENDED_RELEASE_TABLET | Freq: Two times a day (BID) | ORAL | Status: DC
Start: 2021-10-21 — End: 2021-10-21

## 2021-10-21 MED ORDER — ALBUTEROL SULFATE (2.5 MG/3ML) 0.083% IN NEBU: 2.5000 mg | INHALATION_SOLUTION | Freq: Four times a day (QID) | RESPIRATORY_TRACT | Status: DC | PRN

## 2021-10-21 MED ORDER — DAPTOMYCIN IV (FOR PTA / DISCHARGE USE ONLY): 650.0000 mg | INTRAVENOUS | Status: DC

## 2021-10-21 MED ORDER — ATORVASTATIN CALCIUM 20 MG PO TABS: 20.0000 mg | ORAL_TABLET | Freq: Every day | ORAL | Status: DC

## 2021-10-21 MED ORDER — CEFEPIME IV (FOR PTA / DISCHARGE USE ONLY): 2.0000 g | Freq: Two times a day (BID) | INTRAVENOUS | Status: DC

## 2021-10-21 MED ORDER — LAMOTRIGINE 100 MG PO TABS: 100.0000 mg | ORAL_TABLET | Freq: Two times a day (BID) | ORAL | Status: DC

## 2021-10-21 NOTE — TOC Initial Note (Signed)
Transition of Care (TOC) - Initial/Assessment Note  ? ? ?Patient Details  ?Name: Richard Wiggins ?MRN: [id number] ?Date of Birth: Jul 21, 1950 ? ?Transition of Care (TOC) CM/SW Contact:    ?Shelbie Hutching, RN ?Phone Number: ?10/21/2021, 4:28 PM ? ?Clinical Narrative:                 ?Patient was set up with [facility] and [place] [facility] RN, PT, and OT.  Floydene Flock with [facility] is aware that patient was seen in the ED and is discharging home.   ? ?No further TOC needs identified. ? ?  ?  ? ? ?Patient Goals and CMS Choice ?  ?  ?  ? ?Expected Discharge Plan and Services ?  ?  ?  ?  ?  ?                ?  ?  ?  ?  ?  ?  ?  ?  ?  ?  ? ?Prior Living Arrangements/Services ?  ?  ?  ?       ?  ?  ?  ?  ? ?Activities of Daily Living ?  ?  ? ?Permission Sought/Granted ?  ?  ?   ?   ?   ?   ? ?Emotional Assessment ?  ?  ?  ?  ?  ?  ? ?Admission diagnosis:  SOB, EMS ?Patient Active Problem List  ? Diagnosis Date Noted  ? Infection of prosthetic total hip joint, initial encounter ([place]) 10/16/2021  ? Dehydration   ? Epistaxis 09/23/2021  ? Anemia 09/23/2021  ? Lung cancer ([place]) 08/18/2021  ? Renal artery stenosis ([place]) 08/11/2021  ? CKD (chronic kidney disease), stage III ([place]) 03/11/2021  ? 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 Iron deficiency anemia 02/04/2021  ? Long term (current) use of anticoagulants 11/12/2020  ? Positive colorectal cancer screening using Cologuard test 05/22/2020  ? Goals of care, counseling/discussion 05/06/2020  ? Malignant neoplasm of upper lobe of left lung ([place]) 05/06/2020  ? Preventative health care 03/26/2020  ? Atherosclerosis of native arteries of extremity with intermittent claudication ([place]) 03/14/2020  ? Trigger middle finger of right hand 02/20/2020  ? Abdominal aortic aneurysm (AAA) without rupture ([place]) 02/20/2020  ? 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  ? Benign essential HTN 02/10/2020  ? Pre-diabetes 02/10/2020  ? BPH  (benign prostatic hyperplasia) 02/09/2020  ? Pancytopenia ([place]) 02/09/2020  ? Weakness of lower extremity 02/01/2020  ? COPD (chronic obstructive pulmonary disease) ([place]) 02/01/2020  ? Chronic systolic CHF (congestive heart failure) ([place]) 02/01/2020  ? Depression 02/01/2020  ? A-fib ([place]) 02/01/2020  ? CVA (cerebral vascular accident) ([place]) 02/01/2020  ? Coronary artery disease 02/01/2020  ? Carotid artery stenosis 02/01/2020  ? HLD (hyperlipidemia) 02/01/2020  ? Carotid stenosis, asymptomatic, left 02/14/2018  ? S/P coronary artery stent placement 09/20/2017  ? Centrilobular emphysema ([place]) 06/10/2017  ? Chronic back pain 06/10/2017  ? DOE (dyspnea on exertion) 06/10/2017  ? Elevated TSH 06/10/2017  ? Hematoma of groin 06/10/2017  ? PAD (peripheral artery disease) ([place]) 06/10/2017  ? Pulmonary embolism ([place]) 06/10/2017  ? Tobacco abuse 06/10/2017  ? Unintended weight loss 06/10/2017  ? AAA (abdominal aortic aneurysm) ([place]) 06/10/2017  ? PAF (paroxysmal atrial fibrillation) ([place]) 06/10/2017  ? Systolic CHF, chronic ([place]) 06/10/2017  ? Hypertension 05/31/2017  ? Other hyperlipidemia 05/31/2017  ? Hemiparesis affecting left side as late effect of cerebrovascular  accident [facility]) 05/29/2017  ? Carotid artery stenosis, symptomatic, right 05/29/2017  ? 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 ?PCP:  [facility] ?Pharmacy:   ?[facility] [place] ?[place] ?[place] [address] ?Phone: [phone] Fax: [phone] ? ?[facility] [phone] - [address] ?[place] ?[place] [address] ?Phone: [phone] Fax: [phone] ? ? ? ? ?Social Determinants of Health (SDOH) Interventions ?  ? ?Readmission Risk Interventions ?   ? View : No data to display.  ?  ?  ?  ? ? ? ?

## 2021-10-21 NOTE — ED Triage Notes (Signed)
Pt to ED [facility] ?Pt discharged from [facility] last night after being treated for sepsis.   ? ?Hip surgery was a few months ago ? ?Pt states he became hypoxic at home, told by home health nurse that SPO2 was "low" (no number given) and that BP was 100/50, but EMS got SPO2 96-98% on RA and EMS BP was 130/90. ? ?Pt called EMS this AM for weakness and SOB. Pt ambulated to EMS stretcher and per wife, pt had shuffling gait which is not normal for him. ? ?LBM was Thursday. ?

## 2021-10-21 NOTE — ED Provider Notes (Signed)
? ?[facility] ?Provider Note ? ? ? Event Date/Time  ? First MD Initiated Contact with Patient 10/21/21 1322   ?  (approximate) ? ? ?History  ? ?Shortness of Breath and Weakness ? ? ?HPI ? ?Richard Wiggins is a 71 y.o. male with a past medical history of CKD, anemia, atherosclerosis, AAA, BPH, COPD, A-fib, CVA, CAD, HPL, CHF, PE on Eliquis, HTN and recent admission for infection of a right prosthetic hip discharged on 4/10 [place] seen in follow-up yesterday by orthopedic group with no acute concerns at that time as well as had not been seen by ID currently on Dapto and cefepime who presents via EMS from home after home health nurse who came out to his house to assist with antibiotic administration through his port told him his oxygen was low and that his heart rate was irregular.  He states he was feeling fine other than some nausea last night, the patient states leaving hospital he has not had any new complaints.  He specifically denies any new chest pain, change in his chronic cough, abdominal pain, back pain, rash or extremity pain.  He states he has slight weakness of tremor in his left hand from prior stroke but no other residual weakness.  He has been taking his Eliquis since he left the hospital.  He also has been taking some MiraLAX and took oral Zofran for his nausea and constipation.  He is on opioids for his pain.  He denies any other acute concerns.  ?  ? ? ?Physical Exam  ?Triage Vital Signs: ?ED Triage Vitals  ?Enc Vitals Group  ?   BP   ?   Pulse   ?   Resp   ?   Temp   ?   Temp src   ?   SpO2   ?   Weight   ?   Height   ?   Head Circumference   ?   Peak Flow   ?   Pain Score   ?   Pain Loc   ?   Pain Edu?   ?   Excl. in [place]?   ? ? ?Most recent vital signs: ?Vitals:  ? 10/21/21 1338 10/21/21 1611  ?BP: 130/63 135/77  ?Pulse: 73 77  ?Resp: (!) 21 17  ?Temp: 98.8 ?F (37.1 ?C) 98.8 ?F (37.1 ?C)  ?SpO2: 92% 98%  ? ? ?General: Awake, no distress.  ?CV:  Good peripheral perfusion.  2+  radial pulses.  Slight tremor in the left hand.  No significant murmur. ?Resp:  Normal effort.  Clear bilaterally. ?Abd:  No distention.  Soft. ?Other:  No pronator drift.  No finger dysmetria.  Patient is oriented x4.  He has full strength in his left lower extremity and right upper extremity.  He has a slight tremor in his left arm which seems slightly weaker on handgrip strength cart to the right.  He is slightly weaker in the right hip which he states is baseline after his most recent surgery but is able to move his toes on the right foot. ? ? ?ED Results / Procedures / Treatments  ?Labs ?(all labs ordered are listed, but only abnormal results are displayed) ?Labs Reviewed  ?CBC WITH DIFFERENTIAL/PLATELET - Abnormal; Notable for the following components:  ?    Result Value  ? RBC 2.58 (*)   ? Hemoglobin 7.2 (*)   ? HCT 22.9 (*)   ? RDW 15.8 (*)   ? Lymphs Abs  0.6 (*)   ? Abs Immature Granulocytes 0.14 (*)   ? All other components within normal limits  ?BRAIN NATRIURETIC PEPTIDE - Abnormal; Notable for the following components:  ? B Natriuretic Peptide 175.5 (*)   ? All other components within normal limits  ?COMPREHENSIVE METABOLIC PANEL - Abnormal; Notable for the following components:  ? Sodium 132 (*)   ? Chloride 97 (*)   ? Glucose, Bld 118 (*)   ? Calcium 8.6 (*)   ? Albumin 2.1 (*)   ? All other components within normal limits  ?RESP PANEL BY RT-PCR (FLU A&B, COVID) ARPGX2  ?TROPONIN I (HIGH SENSITIVITY)  ? ? ? ?EKG ? ?ECG shows sinus rhythm with a ventricular rate of 74, borderline right axis deviation, unremarkable intervals without evidence of acute ischemia or significant arrhythmia. ? ? ?RADIOLOGY ?Chest reviewed by myself shows no focal consoidation, effusion, edema, pneumothorax or other clear acute thoracic process. I also reviewed radiology interpretation and agree with findings described. ? ? ? ?PROCEDURES: ? ?Critical Care performed: No ? ?.1-3 Lead EKG Interpretation ?Performed by: Lucrezia Starch, MD ?Authorized by: Lucrezia Starch, MD  ? ?  Interpretation: normal   ?  ECG rate assessment: normal   ?  Rhythm: sinus rhythm   ?  Ectopy: none   ?  Conduction: normal   ? ?The patient is on the cardiac monitor to evaluate for evidence of arrhythmia and/or significant heart rate changes. ? ? ?MEDICATIONS ORDERED IN ED: ?Medications  ?albuterol (PROVENTIL) (2.5 MG/3ML) 0.083% nebulizer solution 2.5 mg (has no administration in time range)  ?apixaban (ELIQUIS) tablet 5 mg (has no administration in time range)  ?atorvastatin (LIPITOR) tablet 20 mg (has no administration in time range)  ?oxyCODONE (OXYCONTIN) 12 hr tablet 10 mg (has no administration in time range)  ?polyethylene glycol (MIRALAX / GLYCOLAX) packet 17 g (has no administration in time range)  ?sacubitril-valsartan (ENTRESTO) 49-51 mg per tablet (has no administration in time range)  ?lamoTRIgine (LAMICTAL) tablet 100 mg (has no administration in time range)  ?daptomycin (CUBICIN) IVPB (has no administration in time range)  ?ceFEPime (MAXIPIME) IVPB (has no administration in time range)  ? ? ? ?IMPRESSION / MDM / ASSESSMENT AND PLAN / ED COURSE  ?I reviewed the triage vital signs and the nursing notes. ?             ?               ? ?Differential diagnosis includes, but is not limited to possible paroxysmal arrhythmia as ECG here shows sinus rhythm, pneumonia, CHF, anemia, metabolic derangements and possible atypical angina.  I have a lower suspicion for PE as patient reports compliant with his Eliquis.  He denies any new chest or back pain have a very low suspicion for dissection.  He does not seem to have any acute neurological deficits to suggest a CVA. ? ?Chest reviewed by myself shows no focal consoidation, effusion, edema, pneumothorax or other clear acute thoracic process. I also reviewed radiology interpretation and agree with findings described. ? ?CMP shows no significant acute electrolyte or metabolic derangements.  COVID influenza  PCR is negative.  EKG and negative troponin are not suggestive of ACS and there is no significant arrhythmia today. ?BNP is the best it has been sometime at 175 compared to 826 4 weeks ago.  CBC without any leukocytosis and stable anemia with hemoglobin 7.2.  Seems patient ranges between 5.5 and 8.3.  Normal platelets today. ? ?  Patient was ambulated and had SPO2 maintained greater than 92%.  Overall unclear etiology for reported low oxygen and her heart earlier today certainly possible this was related to poor waveform but at this time I feel suspicion for immediate life-threatening process.  Discussed bowel cleanout with increased MiraLAX and some Colace given patient reports constipation with opioids.  Written instructions provided.  I offered to make patient a border so that he could be assessed by social worker for additional home health services although on reassessment they are declining this and wished to be discharged.  I think this is reasonable and he will follow-up with his outpatient providers.  Discharged in stable condition. ?  ? ? ?FINAL CLINICAL IMPRESSION(S) / ED DIAGNOSES  ? ?Final diagnoses:  ?Weakness  ?Constipation, unspecified constipation type  ? ? ? ?Rx / DC Orders  ? ?ED Discharge Orders   ? ? None  ? ?  ? ? ? ?Note:  This document was prepared using Dragon voice recognition software and may include unintentional dictation errors. ?  ?Lucrezia Starch, MD ?10/21/21 1616 ? ?

## 2021-10-21 NOTE — Progress Notes (Signed)
[facility]) Hospital Liaison note: ? ?This is a pending outpatient-based Palliative Care patient. Will continue to follow for disposition. ? ?Please call with any outpatient palliative questions or concerns. ? ?Thank you, ?Lorelee Market, LPN ?[facility] Hospital Liaison ?[phone] ?

## 2021-10-21 NOTE — Discharge Instructions (Addendum)
When you wake up: ?Begin a liquid diet. (See list below for suggestions.) ?Take 2 Dulcolax (bisacodyl) tablets. (DO NOT CHEW.) ?Begin MiraLAX on a daily basis per instructions ?1 hour after waking up: ?Mix the entire 238-gram bottle of MiraLAX with 64 ounces of a sports drink. ?Drink all of the mixture over the next few hours until gone. (Suggestion: An 8-ounce glass every 15-30 minutes equals 2-4 hours.) ?It is very important to drink plenty of water and other liquids in order to avoid dehydration and to flush the bowel. (Although alcohol is a liquid, it can make you dehydrated. You should NOT drink alcohol while doing the cleanout.) ?NOTE: Please stay home once you have started your cleanout. Also, the use of moist towelettes or wipes may help to minimize discomfort during the cleanout. A nonprescription 1% hydrocortisone cream may also be soothing when applied to the rectal area after each bowel movement. ?It is common during the cleanout to experience some nausea, bloating, and/or abdominal distention. If you chilled the mixture prior to drinking it, you could experience chills from consuming so much cold liquid in a short time period. If you develop nausea or vomiting, slow down the rate at which you drink the solution. Please attempt to drink all of the laxative solution even if it takes you longer. ?Once stooling slows down, you may resume eating solid food. ?

## 2021-10-22 ENCOUNTER — Telehealth: Payer: Self-pay

## 2021-10-22 NOTE — Telephone Encounter (Signed)
Spoke with patient's spouse Butch Penny and scheduled a Mychart Palliative Consult for 10/27/21 @ 2 PM. ? ?Consent obtained; updated Netsmart, Team List and Epic.  ? ?

## 2021-10-23 ENCOUNTER — Other Ambulatory Visit: Payer: Self-pay | Admitting: *Deleted

## 2021-10-23 DIAGNOSIS — G4734 Idiopathic sleep related nonobstructive alveolar hypoventilation: Secondary | ICD-10-CM

## 2021-10-23 NOTE — Progress Notes (Signed)
Called and spoke with patient's wife, Butch Penny regarding ONO results per Dr. Patsey Berthold.  I advised her that he needs to wear 2L at HS.  She said it could be set up through [place].  I advised her it would probably be Monday before they could get it set up.  She verbalized understanding.  Nothing further needed. ?

## 2021-10-26 ENCOUNTER — Ambulatory Visit: Payer: Medicare Other | Admitting: Cardiology

## 2021-10-26 ENCOUNTER — Encounter: Payer: Self-pay | Admitting: Cardiology

## 2021-10-26 ENCOUNTER — Encounter: Payer: Self-pay | Admitting: Infectious Diseases

## 2021-10-26 ENCOUNTER — Encounter: Payer: Self-pay | Admitting: Oncology

## 2021-10-26 VITALS — BP 120/64 | HR 72 | Ht 70.0 in | Wt 182.0 lb

## 2021-10-26 DIAGNOSIS — I10 Essential (primary) hypertension: Secondary | ICD-10-CM

## 2021-10-26 DIAGNOSIS — T8144XA Sepsis following a procedure, initial encounter: Secondary | ICD-10-CM | POA: Diagnosis not present

## 2021-10-26 DIAGNOSIS — I2510 Atherosclerotic heart disease of native coronary artery without angina pectoris: Secondary | ICD-10-CM

## 2021-10-26 DIAGNOSIS — T8451XA Infection and inflammatory reaction due to internal right hip prosthesis, initial encounter: Secondary | ICD-10-CM | POA: Diagnosis not present

## 2021-10-26 DIAGNOSIS — I480 Paroxysmal atrial fibrillation: Secondary | ICD-10-CM

## 2021-10-26 DIAGNOSIS — I5020 Unspecified systolic (congestive) heart failure: Secondary | ICD-10-CM

## 2021-10-26 NOTE — Patient Instructions (Signed)
Medication Instructions:  ?Your physician recommends that you continue on your current medications as directed. Please refer to the Current Medication list given to you today. ? ?*If you need a refill on your cardiac medications before your next appointment, please call your pharmacy* ? ? ?Lab Work: ?None ordered ?If you have labs (blood work) drawn today and your tests are completely normal, you will receive your results only by: ?MyChart Message (if you have MyChart) OR ?A paper copy in the mail ?If you have any lab test that is abnormal or we need to change your treatment, we will call you to review the results. ? ? ?Testing/Procedures: ?None ordered ? ? ?Follow-Up: ?At [facility], you and your health needs are our priority.  As part of our continuing mission to provide you with exceptional heart care, we have created designated Provider Care Teams.  These Care Teams include your primary Cardiologist (physician) and Advanced Practice Providers (APPs -  Physician Assistants and Nurse Practitioners) who all work together to provide you with the care you need, when you need it. ? ?We recommend signing up for the patient portal called "MyChart".  Sign up information is provided on this After Visit Summary.  MyChart is used to connect with patients for Virtual Visits (Telemedicine).  Patients are able to view lab/test results, encounter notes, upcoming appointments, etc.  Non-urgent messages can be sent to your provider as well.   ?To learn more about what you can do with MyChart, go to [email or web address].   ? ?Your next appointment:   ?1 year(s) ? ?The format for your next appointment:   ?In Person ? ?Provider:   ?You may see Kate Sable, MD or one of the following Advanced Practice Providers on your designated Care Team:   ?Murray Hodgkins, NP ?Christell Faith, PA-C ?Cadence Kathlen Mody, PA-C  ? ? ?Other Instructions ? ? ?Important Information About Sugar ? ? ? ? ?  ?

## 2021-10-26 NOTE — Progress Notes (Signed)
?Cardiology Office Note:   ? ?Date:  10/26/2021  ? ?ID:  Richard Wiggins, DOB [birth year], MRN [id number] ? ?PCP:  [facility]  ?[facility] Cardiologist:  Kate Sable, MD  ?[facility] Electrophysiologist:  None  ? ?Referring MD: No ref. provider found  ? ?Chief Complaint  ?Patient presents with  ? Other  ?  6 week follow up -- Patient c.o swelling in both ankles. Meds reviewed verbally with patient.   ? ? ?History of Present Illness:   ? ?Richard Wiggins is a 71 y.o. 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 fib on Eliquis, CAD s/p PCI to RCA 2018 (LAD 50%, LCx 20%) in [place], former smoker x40+ years, COPD, neoplasm left upper lobe on chemo, Who presents for follow-up. ? ?Being seen for CAD, cardiomyopathy.  Blood pressure was previously low, Aldactone was stopped with normalization of BPs.  He underwent 2 rounds of chemo, developed anemia, diarrhea, overall lethargy.  Patient and wife do not plan on pursuing additional chemotherapeutic treatment.  Plan to see palliative care tomorrow.  States having horrible quality of life with chemo.  Had a recent right hip infection, underwent surgery, has a drain to right hip in place.  Has developed some swelling in the ankles. ? ? ?Prior notes ?Patient's her prior echocardiogram in 06/08/2017 in [place] noted with EF 30 to 35%.  Underwent a left heart catheter coronary in 2018 showing severe stenosis in the 85%.  He underwent PCI to the RCA with drug-eluting stent.  During that hospitalization, EKG/telemetry monitoring showed paroxysmal A. fib.  Also had a stroke during that hospitalization and CEA was performed.  He was subsequently placed on Eliquis.   ?Echocardiogram on 02/01/2020 showed normal systolic function, EF 60 to 37%, normal diastolic function. ? ?Past Medical History:  ?Diagnosis Date  ? AAA (abdominal aortic aneurysm) ([place]) 2020  ? a.  2020 U/S: 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 -plan medical managmment with tight bp control; b. 09/2020 Abd U/S: 4.1cm.  ? Anxiety   ? Aortic atherosclerosis ([place])   ? Benign essential HTN 02/10/2020  ? BPH (benign prostatic hyperplasia) 02/09/2020  ? Brain aneurysm   ? Cancer of upper lobe of left lung ([place]) 04/2020  ? a.) ENB/EBUS performed; Bx non-diagnostic. b.) presumed stage I NSCLC in the LUL; underwent SBRT (60 Gy over 5 fractions)  ? Carotid arterial disease ([place])   ? a. 05/2017 s/p R carotid endarterectomy following CVA.  ? 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 a. 2018 Echo: EF 30-35%; b. 02/2021 Echo: EF 55-60%, nl RV fxn. Triv MR. Asc Ao 49m.  ? CKD (chronic kidney disease), stage III ([facility]   ? Claudication ([facility]   ? a. 09/2020 ABIs: R 0.91. L 0.99.  ? COPD (chronic obstructive pulmonary disease) ([facility]   ? Coronary artery disease   ? a. 06/2017 PCI ([place]): s/p PCI to the RCA. LAD 50, LCX 20.  ? Depression   ? 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 a. 02/2021 Asc Ao 335m  ? Emphysema of lung ([facility]  ? History of blood transfusion   ? HLD (hyperlipidemia)   ? IDA (iron deficiency anemia) 02/09/2020  ? 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  ? Inguinal hernia   ? right  ? Ischemic cardiomyopathy   ? Long term current use of anticoagulant   ?  a.) apixaban  ? Marijuana use, continuous 02/10/2020  ? Nosebleed   ? Osteoporosis   ? PAF (paroxysmal atrial fibrillation) ([place])   ? a. 05/2017 Dx in setting of CVA. CHA2DS2VASc = 6-->Eliquis.  ? Pre-diabetes   ? Pulmonary emboli ([place]) 06/07/2017  ? Right lower lobe pulmonary embolism small segmental, multifocal multifocal pneumonia, mediastinal lymphadenopathy, moderate centrilobular emphysema  ? Seizures ([place])   ? Sigmoid diverticulosis   ? a.) CT 01/27/2021: severe  ? Stroke [facility])   ? a. 05/2017 - hospitalized in [place]->prolonged hospitalization in setting of R CEA, PE, and finding of RCA dzs on cath; b. Residual left sided  weakness to arm and leg.  ? ? ?Past Surgical History:  ?Procedure Laterality Date  ? brain aneurysm with clip    ? COLONOSCOPY WITH PROPOFOL N/A 07/23/2020  ? Procedure: COLONOSCOPY WITH PROPOFOL;  Surgeon: Lin Landsman, MD;  Location: [facility] ENDOSCOPY;  Service: Gastroenterology;  Laterality: N/A;  ? CORONARY ANGIOPLASTY WITH STENT PLACEMENT  07/08/2017  ? INCISION AND DRAINAGE HIP Right 10/16/2021  ? Procedure: IRRIGATION AND DEBRIDEMENT OF INFECTED TOTAL HIP;  Surgeon: Hessie Knows, MD;  Location: [facility] ORS;  Service: Orthopedics;  Laterality: Right;  ? PORTA CATH INSERTION N/A 08/31/2021  ? Procedure: PORTA CATH INSERTION;  Surgeon: Algernon Huxley, MD;  Location: [facility] LAB;  Service: Cardiovascular;  Laterality: N/A;  ? TOTAL HIP ARTHROPLASTY Right   ? VIDEO BRONCHOSCOPY WITH ENDOBRONCHIAL NAVIGATION N/A 04/23/2020  ? Procedure: VIDEO BRONCHOSCOPY WITH ENDOBRONCHIAL NAVIGATION;  Surgeon: Tyler Pita, MD;  Location: [facility] ORS;  Service: Pulmonary;  Laterality: N/A;  ? VIDEO BRONCHOSCOPY WITH ENDOBRONCHIAL ULTRASOUND Left 08/12/2021  ? Procedure: VIDEO BRONCHOSCOPY WITH ENDOBRONCHIAL ULTRASOUND;  Surgeon: Tyler Pita, MD;  Location: [facility] ORS;  Service: Cardiopulmonary;  Laterality: Left;  ? ? ?Current Medications: ?Current Meds  ?Medication Sig  ? albuterol (PROVENTIL) (2.5 MG/3ML) 0.083% nebulizer solution Take 3 mLs (2.5 mg total) by nebulization every 6 (six) hours as needed for wheezing or shortness of breath.  ? albuterol (VENTOLIN HFA) 108 (90 Base) MCG/ACT inhaler INHALE 2 PUFFS INTO THE LUNGS EVERY 6 HOURS AS NEEDED FOR WHEEZING OR SHORTNESS OF BREATH  ? apixaban (ELIQUIS) 5 MG TABS tablet TAKE 1 TABLET(5 MG) BY MOUTH IN THE MORNING AND AT BEDTIME  ? aspirin 81 MG chewable tablet CHEW ONE TABLET BY MOUTH DAILY FOR HEART  ? atorvastatin (LIPITOR) 40 MG tablet TAKE 1 TABLET(40 MG) BY MOUTH AT BEDTIME (Patient taking differently: 20 mg.)  ? baclofen (LIORESAL) 10 MG tablet Take 1 tablet  (10 mg total) by mouth 3 (three) times daily as needed for muscle spasms.  ? Budeson-Glycopyrrol-Formoterol (BREZTRI AEROSPHERE) 160-9-4.8 MCG/ACT AERO Inhale 2 puffs into the lungs in the morning and at bedtime.  ? carvedilol (COREG) 25 MG tablet Take 0.5 tablets (12.5 mg total) by mouth in the morning and at bedtime.  ? ceFEPime (MAXIPIME) IVPB Inject 2 g into the vein every 12 (twelve) hours. Indication:  septic total joint ?First Dose: Yes ?Last Day of Therapy:  5/18/223 ?Labs - Once weekly:  CBC/D, CMP, CPK, ESR ?Fax weekly labs to [phone] ?Method of administration: IV Push ?Method of administration may be changed at the discretion of home infusion pharmacist based upon assessment of the patient and/or caregiver's ability to self-administer the medication ordered.  ? Chlorhexidine Gluconate Cloth 2 % PADS Apply 6 each topically daily.  ? daptomycin (CUBICIN) IVPB Inject 650 mg into the vein daily. Indication:  septic  total joint ?First Dose: Yes ?Last Day of Therapy:  11/26/21 ?Labs - Once weekly:  CBC/D, CMP, CPK, ESR ?Fax weekly labs to [phone] ?Method of administration: IV Push ?Method of administration may be changed at the discretion of home infusion pharmacist based upon assessment of the patient and/or caregiver's ability to self-administer the medication ordered.  ? dexamethasone (DECADRON) 4 MG tablet Take 2 tablets (8 mg total) by mouth daily. Start the day after chemotherapy for 2 days.  ? furosemide (LASIX) 40 MG tablet Take 1 tablet (40 mg total) by mouth as needed.  ? heparin lock flush 100 UNIT/ML SOLN injection 5 mLs (500 Units total) by Intracatheter route daily as needed [facility]).  ? HYDROmorphone (DILAUDID) 2 MG tablet Take 0.5-1 tablets (1-2 mg total) by mouth every 4 (four) hours as needed for moderate pain (pain score 4-6).  ? lamoTRIgine (LAMICTAL) 100 MG tablet Take 100 mg by mouth 2 (two) times daily.  ? lidocaine-prilocaine (EMLA) cream Apply to affected area once  ?  LORazepam (ATIVAN) 0.5 MG tablet Take 1 tablet (0.5 mg total) by mouth every 6 (six) hours as needed (Nausea or vomiting).  ? Multiple Vitamin (MULTI-VITAMIN) tablet Take 1 tablet by mouth daily.  ? onda

## 2021-10-27 ENCOUNTER — Telehealth: Payer: Medicare Other | Admitting: Student

## 2021-10-27 DIAGNOSIS — R531 Weakness: Secondary | ICD-10-CM

## 2021-10-27 DIAGNOSIS — C3402 Malignant neoplasm of left main bronchus: Secondary | ICD-10-CM

## 2021-10-27 DIAGNOSIS — K5900 Constipation, unspecified: Secondary | ICD-10-CM

## 2021-10-27 DIAGNOSIS — Z515 Encounter for palliative care: Secondary | ICD-10-CM

## 2021-10-27 DIAGNOSIS — R0602 Shortness of breath: Secondary | ICD-10-CM

## 2021-10-27 DIAGNOSIS — R52 Pain, unspecified: Secondary | ICD-10-CM | POA: Diagnosis not present

## 2021-10-27 DIAGNOSIS — T8459XA Infection and inflammatory reaction due to other internal joint prosthesis, initial encounter: Secondary | ICD-10-CM

## 2021-10-27 DIAGNOSIS — Z96649 Presence of unspecified artificial hip joint: Secondary | ICD-10-CM

## 2021-10-27 NOTE — Progress Notes (Signed)
? ? ?Manufacturing engineer ?Community Palliative Care Consult Note ?Telephone: [phone]  ?Fax: [phone]  ? ?Date of encounter: 10/27/21 ?2:00 PM ?PATIENT NAME: Richard Wiggins ?[address] ?[place] [address]   ?[phone] (home)  ?DOB: 08-30-1950 ?MRN: [id number] ?PRIMARY CARE PROVIDER:    ?Clinic, Richard Wiggins,  ?[facility] ?[place] [address] ?[phone] ? ?REFERRING PROVIDER:   ?Clinic, Richard Wiggins ?[place] ?[place],  Richard Wiggins [address] ?[phone] ? ?RESPONSIBLE PARTY:    ?Contact Information   ? ? Name Relation Home Work Mobile  ? Richard, Wiggins   [phone]  ? ?  ? ? ?Due to the COVID-19 crisis, this visit was done via telemedicine from my office and it was initiated and consent by this patient and or family. ? ?I connected with  Richard Wiggins OR PROXY on 10/27/21 by a video enabled telemedicine application and verified that I am speaking with the correct person using two identifiers. ?  ?I discussed the limitations of evaluation and management by telemedicine. The patient expressed understanding and agreed to proceed.  ? ?                                 ASSESSMENT AND PLAN / RECOMMENDATIONS:  ? ?Advance Care Planning/Goals of Care: Goals include to maximize quality of life and symptom management. Patient/health care surrogate gave his/her permission to discuss.Our advance care planning conversation included a discussion about:    ?The value and importance of advance care planning  ?Experiences with loved ones who have been seriously ill or have died  ?Exploration of personal, cultural or spiritual beliefs that might influence medical decisions  ?Exploration of goals of care in the event of a sudden injury or illness  ?Identification  of a healthcare agent  ?Reviewed MOST form; will update on next visit.  ?CODE STATUS: DNR ? ?Education on Palliative Medicine vs. Hospice services. Patient would like to limit ED  visits, hospitalizations, complete therapy. Palliative Medicine will continue to monitor for changes and declines, provide ongoing support and symptom management.  ? ?Symptom Management/Plan: ? ?Stage 3 NSCLC with recurrence-s/p SBRT. patient to follow up with oncology regarding anemia, recent abnormal labs; he states that he would not continue with chemotherapy due to his decline with most recent treatment.  ? ?Pain-right hip pain, chronic back pain. Pain anaged with current regimen. Continue OxyContin 10 mg every 12 hours and dilaudid every 4 hours PRN. Continue miralax 17 gm daily for constipation.  ? ?Generalized weakness-continue HH therapy as directed; evaluation last week. Use walker for ambulation. [facility]; awaiting follow-up. Will check with palliative SW to follow up.  ? ?Shortness of breath-due to COPD, HF. Continue Breztri, albuterol nebulizer as directed. He is to start oxygen QHS at 2 lpm. Awaiting delivery of oxygen from [employer].  ? ?Infection of right prosthetic hip-continue IV cefepime and daptomycin through 11/26/21. Follow up with orthopedics and ID as scheduled.  ? ?Follow up Palliative Care Visit: Palliative care will continue to follow for complex medical decision making, advance care planning, and clarification of goals. Return in 4 weeks or prn. ? ?This visit was coded based on medical decision making (MDM). ? ?PPS: 50% ? ?HOSPICE ELIGIBILITY/DIAGNOSIS: TBD ? ?Chief Complaint: Palliative Medicine initial consult.  ? ?HISTORY OF PRESENT ILLNESS:  Richard Wiggins is a 71 y.o. year old male  with stage 3 NSCLC with recurrence,  COPD, chronic systolic CHF, anemia, atrial fibrillation, CVA with left sided hemiparesis, carotid artery stenosis, hypertension, hyperlipidemia, CKD 3, chronic back pain, depression, infection of prosthetic total hip joint, BPH, PAD, hx of PE, hx of AAA. Patient hospitalized 4/7-4/13/23 due to infection of prosthetic total hip  joint. ? ?Patient states his pain is currently managed. Endorses shortness of breath with exertion; awaiting delivery of in home oxygen. Reports constipation; denies nausea. He has HHPT and SN with [facility]. Generalized weakness; uses walker for ambulation. Endorses improvement in appetite. Sleeping well at night. A 10-point ROS is negative, except for the pertinent positives and negatives detailed per the HPI.  ? ?History obtained from review of EMR, discussion with primary team, and interview with family, facility staff/caregiver and/or Richard Wiggins.  ?I reviewed available labs, medications, imaging, studies and related documents from the EMR.  Records reviewed and summarized above.  ? ? ?Physical Exam: ? ?Constitutional: NAD ?General: frail appearing ?EYES: anicteric sclera, lids intact, no discharge  ?ENMT: intact hearing ?CV: no LE edema ?Pulmonary: no increased work of breathing, no cough, room air ?GU: deferred ?MSK: ambulatory w/walker ?Skin:  no rashes or wounds on visible skin ?Neuro: +generalized weakness,  no cognitive impairment ?Psych: non-anxious affect, A and O x 3 ?Hem/lymph/immuno: no widespread bruising ?CURRENT PROBLEM LIST:  ?Patient Active Problem List  ? Diagnosis Date Noted  ? Infection of prosthetic total hip joint, initial encounter ([place]) 10/16/2021  ? Dehydration   ? Epistaxis 09/23/2021  ? Anemia 09/23/2021  ? Lung cancer ([place]) 08/18/2021  ? Renal artery stenosis ([place]) 08/11/2021  ? CKD (chronic kidney disease), stage III ([place]) 03/11/2021  ? 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 Iron deficiency anemia 02/04/2021  ? Long term (current) use of anticoagulants 11/12/2020  ? Positive colorectal cancer screening using Cologuard test 05/22/2020  ? Goals of care, counseling/discussion 05/06/2020  ? Malignant neoplasm of upper lobe of left lung ([place]) 05/06/2020  ? Preventative health care 03/26/2020  ? Atherosclerosis of native arteries of extremity with intermittent  claudication ([place]) 03/14/2020  ? Trigger middle finger of right hand 02/20/2020  ? Abdominal aortic aneurysm (AAA) without rupture ([place]) 02/20/2020  ? 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  ? Benign essential HTN 02/10/2020  ? Pre-diabetes 02/10/2020  ? BPH (benign prostatic hyperplasia) 02/09/2020  ? Pancytopenia ([place]) 02/09/2020  ? Weakness of lower extremity 02/01/2020  ? COPD (chronic obstructive pulmonary disease) ([place]) 02/01/2020  ? Chronic systolic CHF (congestive heart failure) ([place]) 02/01/2020  ? Depression 02/01/2020  ? A-fib ([place]) 02/01/2020  ? CVA (cerebral vascular accident) ([place]) 02/01/2020  ? Coronary artery disease 02/01/2020  ? Carotid artery stenosis 02/01/2020  ? HLD (hyperlipidemia) 02/01/2020  ? Carotid stenosis, asymptomatic, left 02/14/2018  ? S/P coronary artery stent placement 09/20/2017  ? Centrilobular emphysema ([place]) 06/10/2017  ? Chronic back pain 06/10/2017  ? DOE (dyspnea on exertion) 06/10/2017  ? Elevated TSH 06/10/2017  ? Hematoma of groin 06/10/2017  ? PAD (peripheral artery disease) ([place]) 06/10/2017  ? Pulmonary embolism ([place]) 06/10/2017  ? Tobacco abuse 06/10/2017  ? Unintended weight loss 06/10/2017  ? AAA (abdominal aortic aneurysm) ([place]) 06/10/2017  ? PAF (paroxysmal atrial fibrillation) ([place]) 06/10/2017  ? Systolic CHF, chronic ([place]) 06/10/2017  ? Hypertension 05/31/2017  ? Other hyperlipidemia 05/31/2017  ? Hemiparesis affecting left side as late effect of cerebrovascular accident ([place]) 05/29/2017  ? Carotid artery stenosis, symptomatic, right 05/29/2017  ? Embolic stroke involving right middle cerebral artery ([place]) 05/28/2017  ? ?  PAST MEDICAL HISTORY:  ?Active Ambulatory Problems  ?  Diagnosis Date Noted  ? Weakness of lower extremity 02/01/2020  ? COPD (chronic obstructive pulmonary disease) ([place]) 02/01/2020  ? Chronic systolic CHF (congestive heart failure) ([place]) 02/01/2020  ? Depression 02/01/2020  ? A-fib ([place]) 02/01/2020  ? CVA (cerebral vascular accident) ([place])  02/01/2020  ? Coronary artery disease 02/01/2020  ? Carotid artery stenosis 02/01/2020  ? HLD (hyperlipidemia) 02/01/2020  ? BPH (benign prostatic hyperplasia) 02/09/2020  ? Pancytopenia ([place]) 02/09/2020  ? Indetermi

## 2021-10-28 ENCOUNTER — Other Ambulatory Visit: Payer: Self-pay

## 2021-10-28 ENCOUNTER — Encounter: Payer: Self-pay | Admitting: Student

## 2021-10-28 ENCOUNTER — Emergency Department
Admission: EM | Admit: 2021-10-28 | Discharge: 2021-10-28 | Disposition: A | Discharge status: Home / Self Care | Payer: No Typology Code available for payment source | Attending: Emergency Medicine | Admitting: Emergency Medicine

## 2021-10-28 ENCOUNTER — Encounter: Payer: Self-pay | Admitting: Oncology

## 2021-10-28 ENCOUNTER — Encounter: Payer: Self-pay | Admitting: Emergency Medicine

## 2021-10-28 DIAGNOSIS — Z85118 Personal history of other malignant neoplasm of bronchus and lung: Secondary | ICD-10-CM | POA: Diagnosis not present

## 2021-10-28 DIAGNOSIS — R7989 Other specified abnormal findings of blood chemistry: Secondary | ICD-10-CM | POA: Insufficient documentation

## 2021-10-28 DIAGNOSIS — R7401 Elevation of levels of liver transaminase levels: Secondary | ICD-10-CM | POA: Diagnosis not present

## 2021-10-28 DIAGNOSIS — D649 Anemia, unspecified: Secondary | ICD-10-CM | POA: Insufficient documentation

## 2021-10-28 LAB — COMPREHENSIVE METABOLIC PANEL
ALT: 55 U/L — ABNORMAL HIGH (ref 0–44)
AST: 72 U/L — ABNORMAL HIGH (ref 15–41)
Albumin: 2.7 g/dL — ABNORMAL LOW (ref 3.5–5.0)
Alkaline Phosphatase: 105 U/L (ref 38–126)
Anion gap: 12 (ref 5–15)
BUN: 27 mg/dL — ABNORMAL HIGH (ref 8–23)
CO2: 26 mmol/L (ref 22–32)
Calcium: 9.2 mg/dL (ref 8.9–10.3)
Chloride: 97 mmol/L — ABNORMAL LOW (ref 98–111)
Creatinine, Ser: 1.82 mg/dL — ABNORMAL HIGH (ref 0.61–1.24)
GFR, Estimated: 39 mL/min — ABNORMAL LOW (ref >60)
Glucose, Bld: 113 mg/dL — ABNORMAL HIGH (ref 70–99)
Potassium: 4.4 mmol/L (ref 3.5–5.1)
Sodium: 135 mmol/L (ref 135–145)
Total Bilirubin: 1 mg/dL (ref 0.3–1.2)
Total Protein: 7.4 g/dL (ref 6.5–8.1)

## 2021-10-28 LAB — PROTIME-INR
INR: 1.8 — ABNORMAL HIGH (ref 0.8–1.2)
Prothrombin Time: 20.5 seconds — ABNORMAL HIGH (ref 11.4–15.2)

## 2021-10-28 LAB — CBC
HCT: 22.8 % — ABNORMAL LOW (ref 39.0–52.0)
Hemoglobin: 6.9 g/dL — ABNORMAL LOW (ref 13.0–17.0)
MCH: 28.3 pg (ref 26.0–34.0)
MCHC: 30.3 g/dL (ref 30.0–36.0)
MCV: 93.4 fL (ref 80.0–100.0)
Platelets: 264 10*3/uL (ref 150–400)
RBC: 2.44 MIL/uL — ABNORMAL LOW (ref 4.22–5.81)
RDW: 16.7 % — ABNORMAL HIGH (ref 11.5–15.5)
WBC: 5.4 10*3/uL (ref 4.0–10.5)
nRBC: 0 % (ref 0.0–0.2)

## 2021-10-28 LAB — PREPARE RBC (CROSSMATCH)

## 2021-10-28 MED ORDER — ONDANSETRON HCL 4 MG/2ML IJ SOLN
4.0000 mg | Freq: Once | INTRAMUSCULAR | Status: AC
  Administered 2021-10-28: 4 mg via INTRAVENOUS
  Filled 2021-10-28: qty 2

## 2021-10-28 MED ORDER — SODIUM CHLORIDE 0.9 % IV SOLN
10.0000 mL/h | Freq: Once | INTRAVENOUS | Status: DC
Start: 2021-10-28 — End: 2021-10-29

## 2021-10-28 MED ORDER — OXYCODONE HCL ER 10 MG PO T12A: 10.0000 mg | EXTENDED_RELEASE_TABLET | Freq: Two times a day (BID) | ORAL | Status: DC

## 2021-10-28 MED ORDER — HYDROMORPHONE HCL 1 MG/ML IJ SOLN
0.5000 mg | Freq: Once | INTRAMUSCULAR | Status: AC
  Administered 2021-10-28: 0.5 mg via INTRAVENOUS
  Filled 2021-10-28: qty 1

## 2021-10-28 NOTE — Progress Notes (Signed)
[facility] ED Manufacturing engineer [facility] Hospital Liaison note: ? ?This patient is currently enrolled in [facility] outpatient-based Palliative Care. Will continue to follow for disposition. ? ?Please call with any outpatient palliative questions or concerns. ? ?Thank you, ?Lorelee Market, LPN ?[facility] Hospital Liaison ?[phone] ?

## 2021-10-28 NOTE — Discharge Instructions (Signed)
Call Dr. Janese Banks to set up an appointment TOMORROW ?

## 2021-10-28 NOTE — ED Triage Notes (Addendum)
Pt comes into the ED via POV c/o abnormal labs.  Pt was seen here on 4/12 and he got a call back today stating he needed to come back due to his hgb being 7.2.  Pt denies any symptoms of SHOB, dizziness, or weakness.  Pt denies any problems at this time.  ?

## 2021-10-28 NOTE — ED Provider Notes (Signed)
? ?[facility] ?Provider Note ? ? ? Event Date/Time  ? First MD Initiated Contact with Patient 10/28/21 1520   ?  (approximate) ? ? ?History  ? ?Abnormal Lab ? ? ?HPI ? ?Richard Wiggins is a 71 y.o. male here with abnormal lab.  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  The patient has had a fairly complex recent past medical history including recent hip infection, anemia, as well as recent visit for weakness on 4/12.  He had labs drawn at the [place] this week and was told that his hemoglobin was less than 7 so he was Told to come to the ER today for transfusion.  He states that overall today, he actually feels very well.  Denies any complaints.  Specifically, he does not feel lightheaded, dizzy, denies any shortness of breath.  No weakness or numbness.  Denies any active bleeding. ?  ? ? ?Physical Exam  ? ?Triage Vital Signs: ?ED Triage Vitals  ?Enc Vitals Group  ?   BP 10/28/21 1235 109/65  ?   Pulse Rate 10/28/21 1235 72  ?   Resp 10/28/21 1235 18  ?   Temp 10/28/21 1235 98.5 ?F (36.9 ?C)  ?   Temp Source 10/28/21 1235 Oral  ?   SpO2 10/28/21 1235 98 %  ?   Weight 10/28/21 1249 181 lb 15.8 oz (82.6 kg)  ?   Height 10/28/21 1249 5\' 10"  (1.778 m)  ?   Head Circumference --   ?   Peak Flow --   ?   Pain Score 10/28/21 1249 0  ?   Pain Loc --   ?   Pain Edu? --   ?   Excl. in [place]? --   ? ? ?Most recent vital signs: ?Vitals:  ? 10/28/21 1800 10/28/21 1834  ?BP: (!) 141/80 130/71  ?Pulse: 63 67  ?Resp: 20 20  ?Temp: 98.2 ?F (36.8 ?C) 98.4 ?F (36.9 ?C)  ?SpO2: 97% 100%  ? ? ? ?General: Awake, no distress.  ?CV:  Good peripheral perfusion.  ?Resp:  Normal effort.  ?Abd:  No distention.  ?Other:  2+ pulses bilaterally.  Mildly dry mucous membranes. ? ? ?ED Results / Procedures / Treatments  ? ?Labs ?(all labs ordered are listed, but only abnormal results are displayed) ?Labs Reviewed  ?COMPREHENSIVE METABOLIC  PANEL - Abnormal; Notable for the following components:  ?    Result Value  ? Chloride 97 (*)   ? Glucose, Bld 113 (*)   ? BUN 27 (*)   ? Creatinine, Ser 1.82 (*)   ? Albumin 2.7 (*)   ? AST 72 (*)   ? ALT 55 (*)   ? GFR, Estimated 39 (*)   ? All other components within normal limits  ?CBC - Abnormal; Notable for the following components:  ? RBC 2.44 (*)   ? Hemoglobin 6.9 (*)   ? HCT 22.8 (*)   ? RDW 16.7 (*)   ? All other components within normal limits  ?PROTIME-INR - Abnormal; Notable for the following components:  ? Prothrombin Time 20.5 (*)   ? INR 1.8 (*)   ? All other components within normal limits  ?POC OCCULT BLOOD, ED  ?TYPE AND SCREEN  ?PREPARE RBC (CROSSMATCH)  ? ? ? ?EKG ? ? ?RADIOLOGY ? ? ? ?I also independently reviewed and agree wit radiologist interpretations. ? ? ?PROCEDURES: ? ?  Critical Care performed: No ? ?.1-3 Lead EKG Interpretation ?Performed by: Duffy Bruce, MD ?Authorized by: Duffy Bruce, MD  ? ?  Interpretation: normal   ?  ECG rate:  60-80 ?  ECG rate assessment: normal   ?  Rhythm: sinus rhythm   ?  Ectopy: none   ?  Conduction: normal   ?Comments:  ?   Indication:  ? ? ? ?MEDICATIONS ORDERED IN ED: ?Medications  ?0.9 %  sodium chloride infusion (0 mL/hr Intravenous Hold 10/28/21 1532)  ?oxyCODONE (OXYCONTIN) 12 hr tablet 10 mg (has no administration in time range)  ?HYDROmorphone (DILAUDID) injection 0.5 mg (0.5 mg Intravenous Given 10/28/21 1610)  ?ondansetron [facility]) injection 4 mg (4 mg Intravenous Given 10/28/21 1610)  ? ? ? ?IMPRESSION / MDM / ASSESSMENT AND PLAN / ED COURSE  ?I reviewed the triage vital signs and the nursing notes. ?             ?               ? ? ?The patient is on the cardiac monitor to evaluate for evidence of arrhythmia and/or significant heart rate changes. ? ? ? ?MDM:  ?71 year old very pleasant male here with anemia.  Patient has a long, well-documented history of anemia.  He has been in the 5-8 range for the last several months.  Patient denies  any signs of active bleeding.  He is asymptomatic.  I extensively reviewed his records, as well as discussed the case with his oncologist, Dr. Janese Banks.  Given his otherwise well appearance, will transfuse 1 unit and discharged with outpatient follow-up with Dr. Janese Banks tomorrow.  Otherwise, he is without complaints.  CMP does show a slight elevation in his BUN to creatinine, which I suspect is due to mild dehydration.  He will be given a transfusion here and I encourage p.o. hydration at home.  Mild transaminitis is near baseline. ? ?Following transfusion, patient is well-appearing, afebrile, with no signs of fluid overload or distress.  Will discharge with outpatient oncology follow-up. ? ? ?MEDICATIONS GIVEN IN ED: ?Medications  ?0.9 %  sodium chloride infusion (0 mL/hr Intravenous Hold 10/28/21 1532)  ?oxyCODONE (OXYCONTIN) 12 hr tablet 10 mg (has no administration in time range)  ?HYDROmorphone (DILAUDID) injection 0.5 mg (0.5 mg Intravenous Given 10/28/21 1610)  ?ondansetron [facility]) injection 4 mg (4 mg Intravenous Given 10/28/21 1610)  ? ? ? ?Consults:  ?Discussed case with Dr. Janese Banks over telephone ? ? ?EMR reviewed  ?Extensively reviewed oncology notes including recent visits with Dr. Janese Banks ? ? ? ? ?FINAL CLINICAL IMPRESSION(S) / ED DIAGNOSES  ? ?Final diagnoses:  ?Acute on chronic anemia  ? ? ? ?Rx / DC Orders  ? ?ED Discharge Orders   ? ? None  ? ?  ? ? ? ?Note:  This document was prepared using Dragon voice recognition software and may include unintentional dictation errors. ?  ?Duffy Bruce, MD ?10/28/21 1845 ? ?

## 2021-10-29 ENCOUNTER — Other Ambulatory Visit: Payer: Self-pay | Admitting: *Deleted

## 2021-10-29 ENCOUNTER — Telehealth: Payer: Self-pay

## 2021-10-29 ENCOUNTER — Other Ambulatory Visit: Payer: Self-pay | Admitting: Nurse Practitioner

## 2021-10-29 ENCOUNTER — Telehealth: Payer: Self-pay | Admitting: *Deleted

## 2021-10-29 ENCOUNTER — Encounter: Payer: Self-pay | Admitting: Pulmonary Disease

## 2021-10-29 ENCOUNTER — Encounter: Payer: Self-pay | Admitting: Oncology

## 2021-10-29 ENCOUNTER — Inpatient Hospital Stay: Payer: No Typology Code available for payment source | Attending: Oncology | Admitting: Oncology

## 2021-10-29 DIAGNOSIS — D649 Anemia, unspecified: Secondary | ICD-10-CM | POA: Insufficient documentation

## 2021-10-29 DIAGNOSIS — I5022 Chronic systolic (congestive) heart failure: Secondary | ICD-10-CM | POA: Diagnosis not present

## 2021-10-29 DIAGNOSIS — Z87891 Personal history of nicotine dependence: Secondary | ICD-10-CM | POA: Insufficient documentation

## 2021-10-29 DIAGNOSIS — C3490 Malignant neoplasm of unspecified part of unspecified bronchus or lung: Secondary | ICD-10-CM | POA: Diagnosis not present

## 2021-10-29 DIAGNOSIS — J449 Chronic obstructive pulmonary disease, unspecified: Secondary | ICD-10-CM | POA: Diagnosis not present

## 2021-10-29 DIAGNOSIS — Z923 Personal history of irradiation: Secondary | ICD-10-CM | POA: Insufficient documentation

## 2021-10-29 DIAGNOSIS — Z9221 Personal history of antineoplastic chemotherapy: Secondary | ICD-10-CM | POA: Insufficient documentation

## 2021-10-29 LAB — TYPE AND SCREEN
ABO/RH(D): B POS
Antibody Screen: NEGATIVE
Unit division: 0

## 2021-10-29 LAB — BPAM RBC
Blood Product Expiration Date: 202304302359
ISSUE DATE / TIME: 202304191541
Unit Type and Rh: 1700

## 2021-10-29 NOTE — Progress Notes (Signed)
Patient has pain from hip, sometimes gets nauseated and she does give him zofran and it helps. He takes naps around lunch time and then later he eats a combo lunch/dinner and late in evening til bed time he will get snack. He is about 50 % than normal. He has been having tremors in both hands and his infectious disease doctor told him it was because of pain med. Pt is tired .  ?

## 2021-10-29 NOTE — Telephone Encounter (Signed)
Received staff message from [place] with [facility]. She stated that she has been unsuccessful with contacting patient.  ?Spoke to patient's spouse, Donna(DPR). She will reach out to [employer]. ?Nothing further needed.  ? ?

## 2021-10-29 NOTE — Telephone Encounter (Signed)
Reached out to [place], [address] Social Worker at [employer] in regards to pts home health aides and if she could provide a timeframe of when aides might go out to pts home. Pt is in great need for aides and wife is struggling to take care of pt. Left VM for social worker to return call and provided a call back phone number [phone]. ?

## 2021-10-30 ENCOUNTER — Other Ambulatory Visit: Payer: Self-pay | Admitting: Nurse Practitioner

## 2021-10-31 ENCOUNTER — Encounter: Payer: Self-pay | Admitting: Oncology

## 2021-11-01 ENCOUNTER — Encounter: Payer: Self-pay | Admitting: Oncology

## 2021-11-01 ENCOUNTER — Other Ambulatory Visit: Payer: Self-pay | Admitting: Family Medicine

## 2021-11-01 NOTE — Progress Notes (Signed)
I connected with Richard Wiggins on 11/01/21 at  2:00 PM EDT by video enabled telemedicine visit and verified that I am speaking with the correct person using two identifiers. ?  ?I discussed the limitations, risks, security and privacy concerns of performing an evaluation and management service by telemedicine and the availability of in-person appointments. I also discussed with the patient that there may be a patient responsible charge related to this service. The patient expressed understanding and agreed to proceed. ? ?Other persons participating in the visit and their role in the encounter:  patients wife ? ?Patient's location:  home ?Provider's location:  work ? ?Chief Complaint:  discuss results of bloodwork ? ?History of present illness: Patient is a 71 year old male who was a former smoker and quit in 2018.  He went to the ER in July 2021 for possible strokelike symptoms and was found to have a left upper lobe lung nodule which was followed up with a CT chest which showed a 1.2 x 1.1 cm left upper lobe spiculated nodule.  This was followed by a PET scan which showed that nodule was hypermetabolic with an SUV of 1.02 patient was also incidentally noted to have a 4.3 cm abdominal aortic aneurysm.  He follows up with Dr. Lucky Cowboy for his aneurysm.  He was seen by Dr. Patsey Berthold and underwent ENB.  Biopsy was nondiagnostic.  However given that the nodule was hypermetabolic it was concerning for malignancy and recommendation per Dr. Patsey Berthold was empiric radiation. Patient underwent SBRT to his left upper lobe by Dr. Baruch Gouty ?  ?Patient also has a positive fit test.  Colonoscopy in January 2022 showed no evidence of colon cancer.  There wereCouple of polyps which were resected and were consistent with tubular adenoma ?  ?Patient noted to have enlarging left hilar lymph node which was PET positiveIn January 2023.  He had EBUS guided bronchoscopy with Dr. Patsey Berthold which was consistent with non-small cell lung cancer ?   ?Due to cost concerns patient received 2 weeks of palliative radiation treatment and has received 2 cycles of weekly CarboTaxol chemotherapy ? ?Repeat CT scan showed improvement in size of mediastinal adenopathy and no evidence of distant metastatic disease. ? ?Interval history Patient was noted to have large right  effusion with associated gas density in the iliopsoas bursa concerning for septic right hip joint.  He underwent drainage of the effusion and debridement and is currently on IV antibiotics which she will finish next month. ? ?Currently reports significant fatigue.  He has had 2 ER visits now and has required a blood transfusion almost every 10 days ? ? ?Review of Systems  ?Constitutional:  Positive for malaise/fatigue. Negative for chills, fever and weight loss.  ?HENT:  Negative for congestion, ear discharge and nosebleeds.   ?Eyes:  Negative for blurred vision.  ?Respiratory:  Negative for cough, hemoptysis, sputum production, shortness of breath and wheezing.   ?Cardiovascular:  Negative for chest pain, palpitations, orthopnea and claudication.  ?Gastrointestinal:  Negative for abdominal pain, blood in stool, constipation, diarrhea, heartburn, melena, nausea and vomiting.  ?Genitourinary:  Negative for dysuria, flank pain, frequency, hematuria and urgency.  ?Musculoskeletal:  Negative for back pain, joint pain and myalgias.  ?Skin:  Negative for rash.  ?Neurological:  Negative for dizziness, tingling, focal weakness, seizures, weakness and headaches.  ?Endo/Heme/Allergies:  Does not bruise/bleed easily.  ?Psychiatric/Behavioral:  Negative for depression and suicidal ideas. The patient does not have insomnia.   ? ?Allergies  ?Allergen Reactions  ? Tape Rash  ?  plastic  ? Wound Dressing Adhesive Itching and Rash  ?  Gets stuck to skin, makes wound spread  ? ? ?Past Medical History:  ?Diagnosis Date  ? AAA (abdominal aortic aneurysm) ([place]) 2020  ? a. 2020 U/S: 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 -plan medical  managmment with tight bp control; b. 09/2020 Abd U/S: 4.1cm.  ? Anxiety   ? Aortic atherosclerosis ([place])   ? Benign essential HTN 02/10/2020  ? BPH (benign prostatic hyperplasia) 02/09/2020  ? Brain aneurysm   ? Cancer of upper lobe of left lung ([place]) 04/2020  ? a.) ENB/EBUS performed; Bx non-diagnostic. b.) presumed stage I NSCLC in the LUL; underwent SBRT (60 Gy over 5 fractions)  ? Carotid arterial disease ([place])   ? a. 05/2017 s/p R carotid endarterectomy following CVA.  ? 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 a. 2018 Echo: EF 30-35%; b. 02/2021 Echo: EF 55-60%, nl RV fxn. Triv MR. Asc Ao 25mm.  ? CKD (chronic kidney disease), stage III ([place])   ? Claudication [facility])   ? a. 09/2020 ABIs: R 0.91. L 0.99.  ? COPD (chronic obstructive pulmonary disease) ([place])   ? Coronary artery disease   ? a. 06/2017 PCI ([place]): s/p PCI to the RCA. LAD 50, LCX 20.  ? Depression   ? 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 a. 02/2021 Asc Ao 41mm.  ? Emphysema of lung ([place])   ? History of blood transfusion   ? HLD (hyperlipidemia)   ? IDA (iron deficiency anemia) 02/09/2020  ? 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  ? Inguinal hernia   ? right  ? Ischemic cardiomyopathy   ? Long term current use of anticoagulant   ? a.) apixaban  ? Marijuana use, continuous 02/10/2020  ? Nosebleed   ? Osteoporosis   ? PAF (paroxysmal atrial fibrillation) ([place])   ? a. 05/2017 Dx in setting of CVA. CHA2DS2VASc = 6-->Eliquis.  ? Pre-diabetes   ? Pulmonary emboli ([place]) 06/07/2017  ? Right lower lobe pulmonary embolism small segmental, multifocal multifocal pneumonia, mediastinal lymphadenopathy, moderate centrilobular emphysema  ? Seizures ([place])   ? Sigmoid diverticulosis   ? a.) CT 01/27/2021: severe  ? Stroke [facility])   ? a. 05/2017 - hospitalized in [place]->prolonged hospitalization in setting of R CEA, PE, and finding of RCA dzs on cath; b. Residual left sided weakness to arm and leg.  ? ? ?Past Surgical History:   ?Procedure Laterality Date  ? brain aneurysm with clip    ? COLONOSCOPY WITH PROPOFOL N/A 07/23/2020  ? Procedure: COLONOSCOPY WITH PROPOFOL;  Surgeon: Lin Landsman, MD;  Location: [facility] ENDOSCOPY;  Service: Gastroenterology;  Laterality: N/A;  ? CORONARY ANGIOPLASTY WITH STENT PLACEMENT  07/08/2017  ? INCISION AND DRAINAGE HIP Right 10/16/2021  ? Procedure: IRRIGATION AND DEBRIDEMENT OF INFECTED TOTAL HIP;  Surgeon: Hessie Knows, MD;  Location: [facility] ORS;  Service: Orthopedics;  Laterality: Right;  ? PORTA CATH INSERTION N/A 08/31/2021  ? Procedure: PORTA CATH INSERTION;  Surgeon: Algernon Huxley, MD;  Location: [facility];  Service: Cardiovascular;  Laterality: N/A;  ? TOTAL HIP ARTHROPLASTY Right   ? VIDEO BRONCHOSCOPY WITH ENDOBRONCHIAL NAVIGATION N/A 04/23/2020  ? Procedure: VIDEO BRONCHOSCOPY WITH ENDOBRONCHIAL NAVIGATION;  Surgeon: Tyler Pita, MD;  Location: [facility] ORS;  Service: Pulmonary;  Laterality: N/A;  ? VIDEO BRONCHOSCOPY WITH ENDOBRONCHIAL ULTRASOUND Left 08/12/2021  ? Procedure: VIDEO BRONCHOSCOPY WITH ENDOBRONCHIAL ULTRASOUND;  Surgeon: Tyler Pita, MD;  Location: [facility] ORS;  Service: Cardiopulmonary;  Laterality: Left;  ? ? ?Social History  ? ?Socioeconomic History  ? Marital status: Married  ?  Spouse name: Butch Penny  ? Number of children: Not on file  ? Years of education: Not on file  ? Highest education level: High school graduate  ?Occupational History  ? Occupation: Retired  ?Tobacco Use  ? Smoking status: Former  ?  Packs/day: 2.00  ?  Years: 53.00  ?  Pack years: 106.00  ?  Types: Cigarettes  ?  Quit date: 05/28/2017  ?  Years since quitting: 4.4  ? Smokeless tobacco: Never  ? Tobacco comments:  ?  Quit in 2018  ?Vaping Use  ? Vaping Use: Never used  ?Substance and Sexual Activity  ? Alcohol use: Not Currently  ? Drug use: Not Currently  ?  Types: Marijuana  ?  Comment: last smoke x1 month ago  ? Sexual activity: Not Currently  ?Other Topics Concern  ? Not on file  ?Social  History Narrative  ? Lives with wife. 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 Children x2 children and grandchildren grown.   ? ?Social Determinants of Health  ? ?Financial Resource Strain: Lo

## 2021-11-03 ENCOUNTER — Inpatient Hospital Stay: Payer: No Typology Code available for payment source | Admitting: Oncology

## 2021-11-03 NOTE — Progress Notes (Signed)
PC SW outreached patients spouse, Butch Penny, per [facility] NP - [facility]. [facility] SW referral request to assess needs. ?  ?  ?Spouse shared that patient was approved for 11 hours a week of in home care aids by the [place] but hadn't heard anything on the approval until last week.  ?  ?Spouse shared that she has spoken to [facility] whom is following up on the in home care agencies, due to the one that the referral was made to shared they did not have staffing. [facility] a different home care agency and will follow up with spouse once one it confirmed. ?  ?Spouse expressed caregiver fatigue. SW provided supportive and reflective listening and support to spouse. Spouse had no other questions/concerns for SW at this time and will continue to follow up with [facility] SW for in home care. ?  ?

## 2021-11-04 ENCOUNTER — Encounter: Payer: Self-pay | Admitting: Oncology

## 2021-11-05 ENCOUNTER — Encounter: Payer: Self-pay | Admitting: Hospice and Palliative Medicine

## 2021-11-05 ENCOUNTER — Other Ambulatory Visit: Payer: Self-pay

## 2021-11-05 ENCOUNTER — Encounter: Payer: Self-pay | Admitting: Emergency Medicine

## 2021-11-05 ENCOUNTER — Emergency Department: Payer: No Typology Code available for payment source

## 2021-11-05 ENCOUNTER — Inpatient Hospital Stay
Admission: EM | Admit: 2021-11-05 | Discharge: 2021-11-19 | DRG: 673 | Disposition: A | Discharge status: Home Health | Payer: No Typology Code available for payment source | Attending: Internal Medicine | Admitting: Internal Medicine

## 2021-11-05 ENCOUNTER — Ambulatory Visit: Payer: No Typology Code available for payment source

## 2021-11-05 ENCOUNTER — Inpatient Hospital Stay (HOSPITAL_BASED_OUTPATIENT_CLINIC_OR_DEPARTMENT_OTHER): Payer: No Typology Code available for payment source | Admitting: Hospice and Palliative Medicine

## 2021-11-05 ENCOUNTER — Inpatient Hospital Stay: Payer: No Typology Code available for payment source | Attending: Oncology

## 2021-11-05 ENCOUNTER — Telehealth: Payer: Self-pay | Admitting: *Deleted

## 2021-11-05 VITALS — BP 145/91 | HR 84 | Temp 98.2°F | Resp 17 | Wt 174.0 lb

## 2021-11-05 DIAGNOSIS — J441 Chronic obstructive pulmonary disease with (acute) exacerbation: Secondary | ICD-10-CM | POA: Diagnosis not present

## 2021-11-05 DIAGNOSIS — Z923 Personal history of irradiation: Secondary | ICD-10-CM | POA: Diagnosis not present

## 2021-11-05 DIAGNOSIS — I5022 Chronic systolic (congestive) heart failure: Secondary | ICD-10-CM

## 2021-11-05 DIAGNOSIS — C3490 Malignant neoplasm of unspecified part of unspecified bronchus or lung: Secondary | ICD-10-CM

## 2021-11-05 DIAGNOSIS — I7143 Infrarenal abdominal aortic aneurysm, without rupture: Secondary | ICD-10-CM | POA: Diagnosis present

## 2021-11-05 DIAGNOSIS — J432 Centrilobular emphysema: Secondary | ICD-10-CM | POA: Diagnosis present

## 2021-11-05 DIAGNOSIS — N179 Acute kidney failure, unspecified: Secondary | ICD-10-CM | POA: Diagnosis not present

## 2021-11-05 DIAGNOSIS — J9811 Atelectasis: Secondary | ICD-10-CM | POA: Diagnosis not present

## 2021-11-05 DIAGNOSIS — I2510 Atherosclerotic heart disease of native coronary artery without angina pectoris: Secondary | ICD-10-CM | POA: Diagnosis present

## 2021-11-05 DIAGNOSIS — G9341 Metabolic encephalopathy: Secondary | ICD-10-CM | POA: Diagnosis present

## 2021-11-05 DIAGNOSIS — Z9221 Personal history of antineoplastic chemotherapy: Secondary | ICD-10-CM | POA: Insufficient documentation

## 2021-11-05 DIAGNOSIS — Z7189 Other specified counseling: Secondary | ICD-10-CM

## 2021-11-05 DIAGNOSIS — M00051 Staphylococcal arthritis, right hip: Secondary | ICD-10-CM

## 2021-11-05 DIAGNOSIS — T8450XD Infection and inflammatory reaction due to unspecified internal joint prosthesis, subsequent encounter: Secondary | ICD-10-CM | POA: Diagnosis not present

## 2021-11-05 DIAGNOSIS — I700 Atherosclerosis of aorta: Secondary | ICD-10-CM | POA: Diagnosis present

## 2021-11-05 DIAGNOSIS — Z8673 Personal history of transient ischemic attack (TIA), and cerebral infarction without residual deficits: Secondary | ICD-10-CM | POA: Insufficient documentation

## 2021-11-05 DIAGNOSIS — C3492 Malignant neoplasm of unspecified part of left bronchus or lung: Secondary | ICD-10-CM | POA: Diagnosis present

## 2021-11-05 DIAGNOSIS — N390 Urinary tract infection, site not specified: Secondary | ICD-10-CM | POA: Diagnosis present

## 2021-11-05 DIAGNOSIS — J9621 Acute and chronic respiratory failure with hypoxia: Secondary | ICD-10-CM | POA: Diagnosis not present

## 2021-11-05 DIAGNOSIS — J449 Chronic obstructive pulmonary disease, unspecified: Secondary | ICD-10-CM | POA: Diagnosis present

## 2021-11-05 DIAGNOSIS — I130 Hypertensive heart and chronic kidney disease with heart failure and stage 1 through stage 4 chronic kidney disease, or unspecified chronic kidney disease: Secondary | ICD-10-CM | POA: Diagnosis not present

## 2021-11-05 DIAGNOSIS — J189 Pneumonia, unspecified organism: Secondary | ICD-10-CM | POA: Diagnosis not present

## 2021-11-05 DIAGNOSIS — N170 Acute kidney failure with tubular necrosis: Principal | ICD-10-CM | POA: Diagnosis present

## 2021-11-05 DIAGNOSIS — I480 Paroxysmal atrial fibrillation: Secondary | ICD-10-CM | POA: Diagnosis not present

## 2021-11-05 DIAGNOSIS — Z992 Dependence on renal dialysis: Secondary | ICD-10-CM | POA: Diagnosis not present

## 2021-11-05 DIAGNOSIS — Z66 Do not resuscitate: Secondary | ICD-10-CM | POA: Diagnosis present

## 2021-11-05 DIAGNOSIS — M009 Pyogenic arthritis, unspecified: Secondary | ICD-10-CM

## 2021-11-05 DIAGNOSIS — Z20822 Contact with and (suspected) exposure to covid-19: Secondary | ICD-10-CM | POA: Diagnosis present

## 2021-11-05 DIAGNOSIS — E7800 Pure hypercholesterolemia, unspecified: Secondary | ICD-10-CM | POA: Diagnosis not present

## 2021-11-05 DIAGNOSIS — E876 Hypokalemia: Secondary | ICD-10-CM | POA: Diagnosis not present

## 2021-11-05 DIAGNOSIS — R42 Dizziness and giddiness: Secondary | ICD-10-CM | POA: Diagnosis not present

## 2021-11-05 DIAGNOSIS — I132 Hypertensive heart and chronic kidney disease with heart failure and with stage 5 chronic kidney disease, or end stage renal disease: Secondary | ICD-10-CM | POA: Diagnosis present

## 2021-11-05 DIAGNOSIS — E871 Hypo-osmolality and hyponatremia: Secondary | ICD-10-CM | POA: Diagnosis present

## 2021-11-05 DIAGNOSIS — I509 Heart failure, unspecified: Secondary | ICD-10-CM | POA: Diagnosis not present

## 2021-11-05 DIAGNOSIS — Z87891 Personal history of nicotine dependence: Secondary | ICD-10-CM | POA: Diagnosis not present

## 2021-11-05 DIAGNOSIS — C3412 Malignant neoplasm of upper lobe, left bronchus or lung: Secondary | ICD-10-CM | POA: Insufficient documentation

## 2021-11-05 DIAGNOSIS — Z823 Family history of stroke: Secondary | ICD-10-CM

## 2021-11-05 DIAGNOSIS — D631 Anemia in chronic kidney disease: Secondary | ICD-10-CM | POA: Diagnosis not present

## 2021-11-05 DIAGNOSIS — E785 Hyperlipidemia, unspecified: Secondary | ICD-10-CM | POA: Diagnosis not present

## 2021-11-05 DIAGNOSIS — I69354 Hemiplegia and hemiparesis following cerebral infarction affecting left non-dominant side: Secondary | ICD-10-CM

## 2021-11-05 DIAGNOSIS — Z955 Presence of coronary angioplasty implant and graft: Secondary | ICD-10-CM

## 2021-11-05 DIAGNOSIS — Z7901 Long term (current) use of anticoagulants: Secondary | ICD-10-CM

## 2021-11-05 DIAGNOSIS — N186 End stage renal disease: Secondary | ICD-10-CM | POA: Diagnosis present

## 2021-11-05 DIAGNOSIS — Z86711 Personal history of pulmonary embolism: Secondary | ICD-10-CM

## 2021-11-05 DIAGNOSIS — A419 Sepsis, unspecified organism: Secondary | ICD-10-CM | POA: Diagnosis not present

## 2021-11-05 DIAGNOSIS — E8721 Acute metabolic acidosis: Secondary | ICD-10-CM | POA: Diagnosis present

## 2021-11-05 DIAGNOSIS — Z79899 Other long term (current) drug therapy: Secondary | ICD-10-CM

## 2021-11-05 DIAGNOSIS — I671 Cerebral aneurysm, nonruptured: Secondary | ICD-10-CM | POA: Diagnosis present

## 2021-11-05 DIAGNOSIS — Z7982 Long term (current) use of aspirin: Secondary | ICD-10-CM | POA: Diagnosis not present

## 2021-11-05 DIAGNOSIS — D649 Anemia, unspecified: Secondary | ICD-10-CM | POA: Diagnosis not present

## 2021-11-05 DIAGNOSIS — G40909 Epilepsy, unspecified, not intractable, without status epilepticus: Secondary | ICD-10-CM | POA: Diagnosis present

## 2021-11-05 DIAGNOSIS — F419 Anxiety disorder, unspecified: Secondary | ICD-10-CM | POA: Diagnosis present

## 2021-11-05 DIAGNOSIS — N183 Chronic kidney disease, stage 3 unspecified: Secondary | ICD-10-CM | POA: Diagnosis not present

## 2021-11-05 DIAGNOSIS — R7989 Other specified abnormal findings of blood chemistry: Secondary | ICD-10-CM | POA: Diagnosis present

## 2021-11-05 DIAGNOSIS — N189 Chronic kidney disease, unspecified: Secondary | ICD-10-CM | POA: Diagnosis not present

## 2021-11-05 DIAGNOSIS — Z833 Family history of diabetes mellitus: Secondary | ICD-10-CM

## 2021-11-05 DIAGNOSIS — I10 Essential (primary) hypertension: Secondary | ICD-10-CM | POA: Diagnosis not present

## 2021-11-05 DIAGNOSIS — L02415 Cutaneous abscess of right lower limb: Secondary | ICD-10-CM | POA: Diagnosis present

## 2021-11-05 DIAGNOSIS — Z8249 Family history of ischemic heart disease and other diseases of the circulatory system: Secondary | ICD-10-CM

## 2021-11-05 LAB — SAMPLE TO BLOOD BANK

## 2021-11-05 LAB — URINALYSIS, ROUTINE W REFLEX MICROSCOPIC
Bilirubin Urine: NEGATIVE
Glucose, UA: NEGATIVE mg/dL
Ketones, ur: 5 mg/dL — AB
Leukocytes,Ua: NEGATIVE
Nitrite: NEGATIVE
Protein, ur: 100 mg/dL — AB
Specific Gravity, Urine: 1.019 (ref 1.005–1.030)
pH: 5 (ref 5.0–8.0)

## 2021-11-05 LAB — COMPREHENSIVE METABOLIC PANEL
ALT: 34 U/L (ref 0–44)
AST: 67 U/L — ABNORMAL HIGH (ref 15–41)
Albumin: 2.4 g/dL — ABNORMAL LOW (ref 3.5–5.0)
Alkaline Phosphatase: 110 U/L (ref 38–126)
Anion gap: 10 (ref 5–15)
BUN: 60 mg/dL — ABNORMAL HIGH (ref 8–23)
CO2: 22 mmol/L (ref 22–32)
Calcium: 9.1 mg/dL (ref 8.9–10.3)
Chloride: 102 mmol/L (ref 98–111)
Creatinine, Ser: 4.63 mg/dL — ABNORMAL HIGH (ref 0.61–1.24)
GFR, Estimated: 13 mL/min — ABNORMAL LOW (ref >60)
Glucose, Bld: 142 mg/dL — ABNORMAL HIGH (ref 70–99)
Potassium: 4 mmol/L (ref 3.5–5.1)
Sodium: 134 mmol/L — ABNORMAL LOW (ref 135–145)
Total Bilirubin: 0.9 mg/dL (ref 0.3–1.2)
Total Protein: 7.8 g/dL (ref 6.5–8.1)

## 2021-11-05 LAB — CBC WITH DIFFERENTIAL/PLATELET
Abs Immature Granulocytes: 0.07 10*3/uL (ref 0.00–0.07)
Basophils Absolute: 0 10*3/uL (ref 0.0–0.1)
Basophils Relative: 1 %
Eosinophils Absolute: 0.3 10*3/uL (ref 0.0–0.5)
Eosinophils Relative: 5 %
HCT: 26.1 % — ABNORMAL LOW (ref 39.0–52.0)
Hemoglobin: 8 g/dL — ABNORMAL LOW (ref 13.0–17.0)
Immature Granulocytes: 1 %
Lymphocytes Relative: 7 %
Lymphs Abs: 0.4 10*3/uL — ABNORMAL LOW (ref 0.7–4.0)
MCH: 27.4 pg (ref 26.0–34.0)
MCHC: 30.7 g/dL (ref 30.0–36.0)
MCV: 89.4 fL (ref 80.0–100.0)
Monocytes Absolute: 0.4 10*3/uL (ref 0.1–1.0)
Monocytes Relative: 7 %
Neutro Abs: 4.4 10*3/uL (ref 1.7–7.7)
Neutrophils Relative %: 79 %
Platelets: 236 10*3/uL (ref 150–400)
RBC: 2.92 MIL/uL — ABNORMAL LOW (ref 4.22–5.81)
RDW: 19.4 % — ABNORMAL HIGH (ref 11.5–15.5)
WBC: 5.7 10*3/uL (ref 4.0–10.5)
nRBC: 0 % (ref 0.0–0.2)

## 2021-11-05 LAB — RESP PANEL BY RT-PCR (FLU A&B, COVID) ARPGX2
Influenza A by PCR: NEGATIVE
Influenza B by PCR: NEGATIVE
SARS Coronavirus 2 by RT PCR: NEGATIVE

## 2021-11-05 LAB — CK: Total CK: 282 U/L (ref 49–397)

## 2021-11-05 MED ORDER — SODIUM CHLORIDE 0.9 % IV SOLN: 650.0000 mg | Freq: Every day | INTRAVENOUS | Status: DC

## 2021-11-05 MED ORDER — ALBUTEROL SULFATE (2.5 MG/3ML) 0.083% IN NEBU
2.5000 mg | INHALATION_SOLUTION | Freq: Four times a day (QID) | RESPIRATORY_TRACT | Status: DC | PRN
  Administered 2021-11-19: 2.5 mg via RESPIRATORY_TRACT
  Filled 2021-11-05: qty 3

## 2021-11-05 MED ORDER — ACETAMINOPHEN 325 MG PO TABS
650.0000 mg | ORAL_TABLET | Freq: Four times a day (QID) | ORAL | Status: DC | PRN
  Administered 2021-11-13: 650 mg via ORAL
  Filled 2021-11-05: qty 2

## 2021-11-05 MED ORDER — SODIUM CHLORIDE 0.9 % IV SOLN: INTRAVENOUS | Status: DC

## 2021-11-05 MED ORDER — MAGNESIUM HYDROXIDE 400 MG/5ML PO SUSP: 30.0000 mL | Freq: Every day | ORAL | Status: DC | PRN

## 2021-11-05 MED ORDER — BUDESONIDE 0.25 MG/2ML IN SUSP
0.2500 mg | Freq: Two times a day (BID) | RESPIRATORY_TRACT | Status: DC
  Administered 2021-11-06 – 2021-11-16 (×20): 0.25 mg via RESPIRATORY_TRACT
  Filled 2021-11-05 (×21): qty 2

## 2021-11-05 MED ORDER — BISACODYL 5 MG PO TBEC
5.0000 mg | DELAYED_RELEASE_TABLET | Freq: Every day | ORAL | Status: DC | PRN
  Filled 2021-11-05: qty 1

## 2021-11-05 MED ORDER — DAPTOMYCIN IV (FOR PTA / DISCHARGE USE ONLY): 650.0000 mg | INTRAVENOUS | Status: DC

## 2021-11-05 MED ORDER — CEFEPIME IV (FOR PTA / DISCHARGE USE ONLY): 2.0000 g | Freq: Two times a day (BID) | INTRAVENOUS | Status: DC

## 2021-11-05 MED ORDER — HYDROMORPHONE HCL 2 MG PO TABS
1.0000 mg | ORAL_TABLET | ORAL | Status: DC | PRN
  Administered 2021-11-06 – 2021-11-11 (×5): 2 mg via ORAL
  Filled 2021-11-05 (×6): qty 1

## 2021-11-05 MED ORDER — CEFEPIME IV (FOR PTA / DISCHARGE USE ONLY): 2.0000 g | INTRAVENOUS | Status: DC

## 2021-11-05 MED ORDER — BACLOFEN 10 MG PO TABS
5.0000 mg | ORAL_TABLET | Freq: Three times a day (TID) | ORAL | Status: DC | PRN
  Administered 2021-11-08 – 2021-11-10 (×2): 5 mg via ORAL
  Filled 2021-11-05 (×2): qty 1

## 2021-11-05 MED ORDER — LACTATED RINGERS IV BOLUS
1000.0000 mL | Freq: Once | INTRAVENOUS | Status: AC
  Administered 2021-11-05: 1000 mL via INTRAVENOUS

## 2021-11-05 MED ORDER — POLYETHYLENE GLYCOL 3350 17 G PO PACK
17.0000 g | PACK | Freq: Every day | ORAL | Status: DC
  Administered 2021-11-07 – 2021-11-19 (×6): 17 g via ORAL
  Filled 2021-11-05 (×13): qty 1

## 2021-11-05 MED ORDER — ONDANSETRON HCL 4 MG/2ML IJ SOLN
4.0000 mg | Freq: Four times a day (QID) | INTRAMUSCULAR | Status: DC | PRN
  Administered 2021-11-08 – 2021-11-09 (×2): 4 mg via INTRAVENOUS
  Filled 2021-11-05 (×2): qty 2

## 2021-11-05 MED ORDER — MORPHINE SULFATE (PF) 2 MG/ML IV SOLN
2.0000 mg | INTRAVENOUS | Status: DC | PRN
  Administered 2021-11-07 – 2021-11-09 (×3): 2 mg via INTRAVENOUS
  Filled 2021-11-05 (×3): qty 1

## 2021-11-05 MED ORDER — TRAZODONE HCL 50 MG PO TABS
25.0000 mg | ORAL_TABLET | Freq: Every evening | ORAL | Status: DC | PRN
  Administered 2021-11-07 – 2021-11-10 (×2): 25 mg via ORAL
  Filled 2021-11-05 (×2): qty 1

## 2021-11-05 MED ORDER — SODIUM CHLORIDE 0.9 % IV SOLN
2.0000 g | INTRAVENOUS | Status: DC
  Administered 2021-11-06: 2 g via INTRAVENOUS
  Filled 2021-11-05: qty 2

## 2021-11-05 MED ORDER — TAMSULOSIN HCL 0.4 MG PO CAPS
0.4000 mg | ORAL_CAPSULE | Freq: Every day | ORAL | Status: DC
  Administered 2021-11-06 – 2021-11-19 (×14): 0.4 mg via ORAL
  Filled 2021-11-05 (×14): qty 1

## 2021-11-05 MED ORDER — ADULT MULTIVITAMIN W/MINERALS CH
1.0000 | ORAL_TABLET | Freq: Every day | ORAL | Status: DC
  Administered 2021-11-06 – 2021-11-19 (×13): 1 via ORAL
  Filled 2021-11-05 (×13): qty 1

## 2021-11-05 MED ORDER — APIXABAN 5 MG PO TABS
5.0000 mg | ORAL_TABLET | Freq: Two times a day (BID) | ORAL | Status: DC
  Administered 2021-11-05: 5 mg via ORAL
  Filled 2021-11-05: qty 1

## 2021-11-05 MED ORDER — BUDESON-GLYCOPYRROL-FORMOTEROL 160-9-4.8 MCG/ACT IN AERO: 2.0000 | INHALATION_SPRAY | Freq: Two times a day (BID) | RESPIRATORY_TRACT | Status: DC

## 2021-11-05 MED ORDER — ALBUTEROL SULFATE HFA 108 (90 BASE) MCG/ACT IN AERS
2.0000 | INHALATION_SPRAY | Freq: Four times a day (QID) | RESPIRATORY_TRACT | Status: DC | PRN
Start: 2021-11-05 — End: 2021-11-05

## 2021-11-05 MED ORDER — ATORVASTATIN CALCIUM 20 MG PO TABS
40.0000 mg | ORAL_TABLET | Freq: Every day | ORAL | Status: DC
Start: 2021-11-07 — End: 2021-11-09
  Administered 2021-11-07 – 2021-11-09 (×3): 40 mg via ORAL
  Filled 2021-11-05 (×3): qty 2

## 2021-11-05 MED ORDER — LORAZEPAM 0.5 MG PO TABS
0.5000 mg | ORAL_TABLET | Freq: Four times a day (QID) | ORAL | Status: DC | PRN
  Administered 2021-11-08: 0.5 mg via ORAL
  Filled 2021-11-05: qty 1

## 2021-11-05 MED ORDER — PROCHLORPERAZINE MALEATE 10 MG PO TABS
10.0000 mg | ORAL_TABLET | Freq: Four times a day (QID) | ORAL | Status: DC | PRN
  Filled 2021-11-05: qty 1

## 2021-11-05 MED ORDER — ONDANSETRON HCL 4 MG PO TABS: 8.0000 mg | ORAL_TABLET | Freq: Two times a day (BID) | ORAL | Status: DC | PRN

## 2021-11-05 MED ORDER — OXYCODONE HCL 5 MG PO TABS
5.0000 mg | ORAL_TABLET | Freq: Two times a day (BID) | ORAL | Status: DC
  Administered 2021-11-05 – 2021-11-19 (×27): 5 mg via ORAL
  Filled 2021-11-05 (×29): qty 1

## 2021-11-05 MED ORDER — DAPTOMYCIN IV (FOR PTA / DISCHARGE USE ONLY)
650.0000 mg | INTRAVENOUS | Status: DC
Start: 2021-11-07 — End: 2021-11-05

## 2021-11-05 MED ORDER — CARVEDILOL 12.5 MG PO TABS
12.5000 mg | ORAL_TABLET | Freq: Two times a day (BID) | ORAL | Status: DC
  Administered 2021-11-05 – 2021-11-19 (×27): 12.5 mg via ORAL
  Filled 2021-11-05 (×3): qty 2
  Filled 2021-11-05 (×3): qty 1
  Filled 2021-11-05: qty 2
  Filled 2021-11-05 (×3): qty 1
  Filled 2021-11-05: qty 2
  Filled 2021-11-05: qty 1
  Filled 2021-11-05: qty 2
  Filled 2021-11-05 (×3): qty 1
  Filled 2021-11-05: qty 2
  Filled 2021-11-05: qty 1
  Filled 2021-11-05: qty 2
  Filled 2021-11-05 (×2): qty 1
  Filled 2021-11-05 (×3): qty 2
  Filled 2021-11-05 (×4): qty 1

## 2021-11-05 MED ORDER — ACETAMINOPHEN 650 MG RE SUPP: 650.0000 mg | Freq: Four times a day (QID) | RECTAL | Status: DC | PRN

## 2021-11-05 MED ORDER — UMECLIDINIUM-VILANTEROL 62.5-25 MCG/ACT IN AEPB
1.0000 | INHALATION_SPRAY | Freq: Every day | RESPIRATORY_TRACT | Status: DC
  Administered 2021-11-07 – 2021-11-19 (×10): 1 via RESPIRATORY_TRACT
  Filled 2021-11-05 (×2): qty 14

## 2021-11-05 MED ORDER — ONDANSETRON HCL 4 MG PO TABS: 4.0000 mg | ORAL_TABLET | Freq: Four times a day (QID) | ORAL | Status: DC | PRN

## 2021-11-05 NOTE — ED Provider Notes (Signed)
? ?[facility] ?Provider Note ? ? ? Event Date/Time  ? First MD Initiated Contact with Patient 11/05/21 1408   ?  (approximate) ? ? ?History  ? ?Altered Mental Status and Weakness ? ? ?HPI ? ?Richard Wiggins is a 71 y.o. male with a history of COPD CHF atrial fibrillation stroke lung cancer hypertension who is currently being treated for lung cancer at cancer center was found to have worsening generalized weakness and confusion over the past 2 or 3 days.  He was taken to cancer center this morning and labs showed renal failure with a creatinine of 4.6, up from baseline of 1.2.  Spouse notes decreased oral intake over the last few days.  He is on IV antibiotics due to a septic arthritis of the right hip that was recently treated with surgical washout. ? ?Patient denies acute pain, vomiting, diarrhea, fever. ?  ? ? ?Physical Exam  ? ?Triage Vital Signs: ?ED Triage Vitals  ?Enc Vitals Group  ?   BP 11/05/21 1221 129/76  ?   Pulse Rate 11/05/21 1221 79  ?   Resp 11/05/21 1221 18  ?   Temp 11/05/21 1221 98 ?F (36.7 ?C)  ?   Temp Source 11/05/21 1221 Oral  ?   SpO2 11/05/21 1221 96 %  ?   Weight 11/05/21 1218 174 lb (78.9 kg)  ?   Height 11/05/21 1218 5\' 10"  (1.778 m)  ?   Head Circumference --   ?   Peak Flow --   ?   Pain Score 11/05/21 1218 4  ?   Pain Loc --   ?   Pain Edu? --   ?   Excl. in [place]? --   ? ? ?Most recent vital signs: ?Vitals:  ? 11/05/21 1221  ?BP: 129/76  ?Pulse: 79  ?Resp: 18  ?Temp: 98 ?F (36.7 ?C)  ?SpO2: 96%  ? ? ? ?General: Awake, no distress.  Dry mucous membranes ?CV:  Good peripheral perfusion.  Regular rate and rhythm ?Resp:  Normal effort.  Clear to auscultation bilaterally ?Abd:  No distention.  Soft and nontender ?Other:  No rash or muscle tenderness.  Full range of motion. ? ? ?ED Results / Procedures / Treatments  ? ?Labs ?(all labs ordered are listed, but only abnormal results are displayed) ?Labs Reviewed  ?RESP PANEL BY RT-PCR (FLU A&B, COVID) [id number]  ?URINALYSIS,  ROUTINE W REFLEX MICROSCOPIC  ? ? ? ?EKG ? ?Interpreted by me ?Normal sinus rhythm rate of 81.  Normal axis intervals QRS ST segments and T waves.  No ischemic changes. ? ? ?RADIOLOGY ?CT head and CT abdomen pelvis without contrast pending ? ? ? ?PROCEDURES: ? ?Critical Care performed: No ? ?Procedures ? ? ?MEDICATIONS ORDERED IN ED: ?Medications  ?lactated ringers bolus 1,000 mL (has no administration in time range)  ? ? ? ?IMPRESSION / MDM / ASSESSMENT AND PLAN / ED COURSE  ?I reviewed the triage vital signs and the nursing notes. ?             ?               ? ?Differential diagnosis includes, but is not limited to, intracranial mass, intracranial hemorrhage, ureteral obstruction, UTI, electrolyte abnormality, anemia, intra-abdominal abscess, pancreatitis, gastritis ? ? ? ?Patient sent to the ED due to generalized weakness and confusion over the last few days.  Vital signs are unremarkable, but lab panel obtained at cancer center shows acute renal failure.  Will obtain CT head and CT abdomen pelvis and plan to admit for further management.  IV fluids ordered. ?Clinical Course as of 11/05/21 1514  ?Thu Nov 05, 2021  ?94 CT head viewed and interpreted by me, negative for intracranial hemorrhage or large mass.  Radiology report reviewed which confirms it is unremarkable. ? ?CT of the abdomen and pelvis without contrast shows no acute intra-abdominal findings.  There does appear to be abscess in the right hip despite continuous IV antibiotics. [PS]  ?  ?Clinical Course User Index ?[PS] Carrie Mew, MD  ? ? ? ?FINAL CLINICAL IMPRESSION(S) / ED DIAGNOSES  ? ?Final diagnoses:  ?Acute renal failure, unspecified acute renal failure type ([place])  ?Malignant neoplasm of lung, unspecified laterality, unspecified part of lung ([place])  ? ? ? ?Rx / DC Orders  ? ?ED Discharge Orders   ? ? None  ? ?  ? ? ? ?Note:  This document was prepared using Dragon voice recognition software and may include unintentional dictation  errors. ?  ?Carrie Mew, MD ?11/05/21 1435 ? ?

## 2021-11-05 NOTE — Assessment & Plan Note (Deleted)
-   The patient will be admitted to a medical telemetry bed. ?- We will hydrate with IV normal saline. ?- We will follow BMP. ?- We will avoid nephrotoxins. ?

## 2021-11-05 NOTE — Progress Notes (Signed)
Right port accessed prior to arrival; pt placed on cardiac monitor.  ?

## 2021-11-05 NOTE — Assessment & Plan Note (Addendum)
now associated with right hip fluid collection possibly seroma less likely abscess ?Per infectious disease recs: IV cefepime and daptomycin --> cefepime switched to ceftriaxone to avoid encephalopathy, daptomycin ordered q 48 hours  ?orthopedic consultation - some fluid accumulation is expected, no surgery planned for now given his other risk factors but may consider IR intervention to aspirate the fluid/abscess  ?

## 2021-11-05 NOTE — Telephone Encounter (Signed)
Wife was sending message that he is unable to arouse him, he is not speaking to her. So it was rec: to go to ER. However he is able to speak after a little bit of time and he is coming into the clinic ?

## 2021-11-05 NOTE — Assessment & Plan Note (Addendum)
Held anticoagulation for now given anemia / bleed risk, may restart w/ heparin vs home eliquis after cath placement / if Hgb stays up ?

## 2021-11-05 NOTE — ED Triage Notes (Signed)
First RN note: ? ?Pt sent to the ED by Cancer center due to new renal failure.  Pt is a current chemo patient with lung caner.  Pt has had multiple hospital admissions recently per [facility] NP.  ?

## 2021-11-05 NOTE — Progress Notes (Signed)
Patient here in [facility] concerns of throat hurting, pain on left side of back, A X O X 4 but per wife for 2 days pt not able to answer questions like were is pain or he rambles inconsistency, was talking shirt off, walking around & couldn't answer were he was going( kinda comes and goes) nausea after IV meds, & low appetite  ?

## 2021-11-05 NOTE — Assessment & Plan Note (Addendum)
Creatinine  is increased from 4.6 on 11/05/21 to 5.1 on 11/06/21, to 6.11 on 11/07/21 --> nephrology consulted ?Creatinine about the same on 11/08/2021, 6.10 --> worse on 05/01 to 6.5 --> 7.0 on 11/10/21  ?Planning permacath placement and hemodialysis  ?Avoid nephrotoxins (holding home entresto, holding diuresis unless absolutely needed, holding home eliquis) ?Monitor BMP per nephro  ?IV fluids w/ caution given CHF history but so far no s/s fluid overload  ?

## 2021-11-05 NOTE — Progress Notes (Signed)
? ?[facility]pt[facility] ?[place] [facility] ?[phone] [phone] ? ?Patient Care Team: ?[facility] as PCP - General ?Kate Sable, MD as PCP - Cardiology (Cardiology) ?Telford Nab, RN as Sales executive ?Noreene Filbert, MD as Referring Physician (Radiation Oncology) ?Sindy Guadeloupe, MD as Consulting Physician (Oncology)  ? ?Name of the patient: Richard Wiggins  ?[id number]  ?09-01-1950  ? ?Date of visit: 11/05/21 ? ?Reason for Consult: ?Richard Wiggins is a 71 y.o. male with multiple medical problems including COPD, history of CVA, A-fib, PE on Eliquis, history of CHF, CKD, chronic anemia, stage I lung cancer status post SBRT now with recurrent stage III lung cancer on CarboTaxol chemotherapy.  ? ?Patient received cycle 1 carbo Taxol chemotherapy on 09/02/2021, cycle 2 on 09/09/2021, and cycle 3 on 09/30/2021. ? ?Patient was recently hospitalized 09/23/2021-09/24/2021 with symptomatic anemia secondary to epistaxis.  He received transfusion.  Eliquis was temporarily held but then restarted after resolution of epistaxis. ? ?Patient was seen in the ED on 10/11/2021 with weakness, cough, congestion and fever.  Work-up was negative for source of infection and patient was discharged home empirically on Keflex. ? ?He was hospitalized 10/16/2021 to 10/20/2021 with infection of his total hip and was taken to the OR for irrigation and debridement on 10/16/2021.  Plan was for 6 weeks of IV antibiotics with cefepime and daptomycin with home health. ? ?Patient was seen in the ED again on 10/21/2021 for shortness of breath and weakness.  Apparently he presented the ED on recommendations of his home health nurse who found hypoxia and bradycardia at home.  Work-up was fairly unrevealing and patient was discharged home for follow-up with his outpatient providers. ? ?Patient was seen again in the ED on 10/28/2021 with progressive anemia after having labs drawn  at the [place] and was told that his hemoglobin was less than 7.  Patient received blood transfusion and was discharged home with plan for oncology follow-up. ? ?Patient was seen virtually by Dr. Janese Banks on 10/29/2021.  Patient has been requiring blood transfusion about every 10 days.  He has had significant fatigue and weakness.  Plan was to pursue bone marrow biopsy for anemia work-up. ? ?Patient was scheduled for labs today with possible blood transfusion tomorrow.  His wife messaged the clinic stating that patient was declining and poorly responsive.  It was recommended that she call 911 and transfer him to the ED but patient refused.  She brought him to the clinic instead for evaluation. ? ?Wife and patient report that he started feeling worse yesterday with progressive weakness, minimal oral intake.  Wife says that she would ask him questions and he would have prolonged periods of silence without ever answering.  Patient endorses chills but denies fevers.  He has had shortness of breath but this does not appear to be worse in severity than his usual.  He has had nausea with antibiotics but no vomiting.  No diarrhea or constipation.  Endorses pain in his hip but this is not unusual.  No chest pain. Patient offers no further specific complaints today. ? ?PAST MEDICAL HISTORY: ?Past Medical History:  ?Diagnosis Date  ? AAA (abdominal aortic aneurysm) ([place]) 2020  ? a. 2020 U/S: 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 -plan medical managmment with tight bp control; b. 09/2020 Abd U/S: 4.1cm.  ? Anxiety   ? Aortic atherosclerosis ([place])   ? Benign essential HTN 02/10/2020  ? BPH (benign prostatic hyperplasia) 02/09/2020  ? Brain aneurysm   ?  Cancer of upper lobe of left lung ([place]) 04/2020  ? a.) ENB/EBUS performed; Bx non-diagnostic. b.) presumed stage I NSCLC in the LUL; underwent SBRT (60 Gy over 5 fractions)  ? Carotid arterial disease ([place])   ? a. 05/2017 s/p R carotid endarterectomy following CVA.  ? Chronic HFimpEF (heart failure  with improved ejection fraction) ([place])   ? a. 2018 Echo: EF 30-35%; b. 02/2021 Echo: EF 55-60%, nl RV fxn. Triv MR. Asc Ao 23m.  ? CKD (chronic kidney disease), stage III ([place]   ? Claudication ([facility]   ? a. 09/2020 ABIs: R 0.91. L 0.99.  ? COPD (chronic obstructive pulmonary disease) ([place]   ? Coronary artery disease   ? a. 06/2017 PCI ([place]): s/p PCI to the RCA. LAD 50, LCX 20.  ? Depression   ? 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 a. 02/2021 Asc Ao 33m  ? Emphysema of lung (HCDol[place]  ? History of blood transfusion   ? HLD (hyperlipidemia)   ? IDA (iron deficiency anemia) 02/09/2020  ? 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  ? Inguinal hernia   ? right  ? Ischemic cardiomyopathy   ? Long term current use of anticoagulant   ? a.) apixaban  ? Marijuana use, continuous 02/10/2020  ? Nosebleed   ? Osteoporosis   ? PAF (paroxysmal atrial fibrillation) ([place]  ? a. 05/2017 Dx in setting of CVA. CHA2DS2VASc = 6-->Eliquis.  ? Pre-diabetes   ? Pulmonary emboli ([place] Ground11/27/2018  ? Right lower lobe pulmonary embolism small segmental, multifocal multifocal pneumonia, mediastinal lymphadenopathy, moderate centrilobular emphysema  ? Seizures (HCF[place]  ? Sigmoid diverticulosis   ? a.) CT 01/27/2021: severe  ? [facility]  ? a. 05/2017 - [place]->prolonged hospitalization in setting of R CEA, PE, and finding of RCA dzs on cath; b. Residual left sided weakness to arm and leg.  ? ? ?PAST SURGICAL HISTORY:  ?Past Surgical History:  ?Procedure Laterality Date  ? brain aneurysm with clip    ? COLONOSCOPY WITH PROPOFOL N/A 07/23/2020  ? Procedure: COLONOSCOPY WITH PROPOFOL;  Surgeon: VaLin LandsmanMD;  [facility];  Service: Gastroenterology;  Laterality: N/A;  ? CORONARY ANGIOPLASTY WITH STENT PLACEMENT  07/08/2017  ? INCISION AND DRAINAGE HIP Right 10/16/2021  ? Procedure: IRRIGATION AND DEBRIDEMENT OF INFECTED TOTAL HIP;  Surgeon: MeHessie KnowsMD;  Location: [facility] ORS;  Service:  Orthopedics;  Laterality: Right;  ? PORTA CATH INSERTION N/A 08/31/2021  ? Procedure: PORTA CATH INSERTION;  Surgeon: DeAlgernon HuxleyMD;  [facility];  Service: Cardiovascular;  Laterality: N/A;  ? TOTAL HIP ARTHROPLASTY Right   ? VIDEO BRONCHOSCOPY WITH ENDOBRONCHIAL NAVIGATION N/A 04/23/2020  ? Procedure: VIDEO BRONCHOSCOPY WITH ENDOBRONCHIAL NAVIGATION;  Surgeon: GoTyler PitaMD;  Location: [facility] ORS;  Service: Pulmonary;  Laterality: N/A;  ? VIDEO BRONCHOSCOPY WITH ENDOBRONCHIAL ULTRASOUND Left 08/12/2021  ? Procedure: VIDEO BRONCHOSCOPY WITH ENDOBRONCHIAL ULTRASOUND;  Surgeon: GoTyler PitaMD;  Location: [facility] ORS;  Service: Cardiopulmonary;  Laterality: Left;  ? ? ?HEMATOLOGY/ONCOLOGY HISTORY:  ?Oncology History  ?Lung cancer ([place]CT[place] ?08/18/2021 Initial Diagnosis  ? Recurrent non-small cell lung cancer (HCLake Tomah[place]?  ?08/18/2021 Cancer Staging  ? Staging form: Lung, AJCC 8th Edition ?- Clinical stage from 08/18/2021: Stage Unknown (cTX, cN1, cM0) - Signed by RaSindy GuadeloupeMD on 08/18/2021 ?  ?09/02/2021 -  Chemotherapy  ? Patient is on Treatment Plan : LUNG Carboplatin / Paclitaxel +  XRT q7d  ?   ? ? ?ALLERGIES:  is allergic to tape and wound dressing adhesive. ? ?MEDICATIONS:  ?Current Outpatient Medications  ?Medication Sig Dispense Refill  ? albuterol (PROVENTIL) (2.5 MG/3ML) 0.083% nebulizer solution Take 3 mLs (2.5 mg total) by nebulization every 6 (six) hours as needed for wheezing or shortness of breath. 75 mL 6  ? albuterol (VENTOLIN HFA) 108 (90 Base) MCG/ACT inhaler INHALE 2 PUFFS INTO THE LUNGS EVERY 6 HOURS AS NEEDED FOR WHEEZING OR SHORTNESS OF BREATH 18 g 2  ? apixaban (ELIQUIS) 5 MG TABS tablet TAKE 1 TABLET(5 MG) BY MOUTH IN THE MORNING AND AT BEDTIME 60 tablet 3  ? aspirin 81 MG chewable tablet CHEW ONE TABLET BY MOUTH DAILY FOR HEART    ? atorvastatin (LIPITOR) 40 MG tablet TAKE 1 TABLET(40 MG) BY MOUTH AT BEDTIME (Patient taking differently: 20 mg.) 90 tablet 3  ? baclofen  (LIORESAL) 10 MG tablet Take 1 tablet (10 mg total) by mouth 3 (three) times daily as needed for muscle spasms. (Patient not taking: Reported on 10/29/2021) 30 each 1  ? Budeson-Glycopyrrol-Formoterol (BREZTRI AEROSPHE

## 2021-11-05 NOTE — Progress Notes (Signed)
Spoke with patient's spouse Butch Penny 11/05/21 @ 10:40 am. Martin Majestic over pre-procedure instructions to include the need to arrive @ 8:30 for 9:30 procedure, need to NPO after midnight on night prior to procedure, need to hold blood thinners morning of procedure and need for driver post-procedure. Patient's spouse verbalized understanding.  ?

## 2021-11-05 NOTE — Consult Note (Signed)
Pharmacy Antibiotic Note ? ?Richard Wiggins is a 71 y.o. male admitted on 11/05/2021 with AKI. Patient with PMH of septic total joint currently receiving IV cefepime and daptomycin outpatient. ?Current outpatient reigmen: end date 11/26/21 ?Cefepime 2 gram Q12H ?Daptomycin 650 mg Q24H ?Per patient: last doses of medication 11/05/21 AM ? ? Pharmacy has been consulted for Daptomycin and cefepime dosing. ? ?Plan: ?Due to AKI will renally dose adjust cefepime to 2 gram Q24H ?Adjust Daptomycin to 650 mg Q48H ?Will check CK level ISO of concomitant statin therapy + daptomycin and AKI ? ?Height: 5\' 10"  (177.8 cm) ?Weight: 78.9 kg (174 lb) ?IBW/kg (Calculated) : 73 ? ?Temp (24hrs), Avg:98 ?F (36.7 ?C), Min:97.7 ?F (36.5 ?C), Max:98.2 ?F (36.8 ?C) ? ?Recent Labs  ?Lab 11/05/21 ?1039  ?WBC 5.7  ?CREATININE 4.63*  ?  ?Estimated Creatinine Clearance: 15.1 mL/min (A) (by C-G formula based on SCr of 4.63 mg/dL (H)).   ? ?Allergies  ?Allergen Reactions  ? Tape Rash  ?  plastic  ? Wound Dressing Adhesive Itching and Rash  ?  Gets stuck to skin, makes wound spread  ? ? ?Antimicrobials this admission: ?4/27 Daptomycin >>  ?4/27 Cefepime >>  ? ?Dose adjustments this admission: ? ? ?Microbiology results: ? ?4/27 UCx: sent  ? ? ?Thank you for allowing pharmacy to be a part of this patient?s care. ? ?Dorothe Pea, PharmD, BCPS ?Clinical Pharmacist   ?11/05/2021 7:33 PM ? ?

## 2021-11-05 NOTE — ED Triage Notes (Signed)
Pt comes into the ED via POV from the cancer center c/o AMS and weakness.  Per Cancer center MD, he has new kidney failure as well.  PT has been on IV abx recently from multiple admissions.  PT also c/o generalized pain.  ?

## 2021-11-05 NOTE — H&P (Addendum)
?  ?  ?[place] ? ? ?PATIENT NAME: Richard Wiggins   ? ?MR#:  [id number] ? ?DATE OF BIRTH:  10-Oct-1950 ? ?DATE OF ADMISSION:  11/05/2021 ? ?PRIMARY CARE PHYSICIAN: [facility]  ? ?Patient is coming from: Home ? ?REQUESTING/REFERRING PHYSICIAN: Brenton Grills, MD ? ?CHIEF COMPLAINT:  ? ?Chief Complaint  ?Patient presents with  ?? Altered Mental Status  ?? Weakness  ? ? ?HISTORY OF PRESENT ILLNESS:  ?Richard Wiggins is a 71 y.o. Caucasian male with medical history significant for COPD, hypertension, dyslipidemia, PE, seizure disorder, CVA, anxiety and septic right hip status post incision and drainage on 4/7 by Dr. Rudene Christians followed by IV antibiotic therapy with cefepime and daptomycin that he continued at home after being discharged on 411.  He presents to the emergency room with acute onset of altered mental status with confusion generalized weakness and lethargy per his wife.  He was seen today at the [facility] for blood work and his creatinine was 4.3 and therefore he was sent to the ER.  He has not been having much appetite over the last few days.  No nausea or vomiting or diarrhea or abdominal pain.  No chest pain or dyspnea or palpitations.  No cough or wheezing.  No dysuria, oliguria or hematuria or flank pain. ? ?ED Course: Upon presenting to the emergency room BP was 145/91 with otherwise normal vital signs.  Labs revealed mild hyponatremia and a BUN of 60 with creatinine of 4.63 up from 27 and 1.82 on 10/28/2021 with stage IIIb CKD.  CBC showed anemia close to his baseline.  UA was positive for UTI ?EKG as reviewed by me : EKG showed normal sinus rhythm with a rate of 81 with poor R wave progression. ?Imaging: Noncontrasted CT scan revealed the following: ?1. No acute intracranial abnormality or significant interval change. ?2. Stable remote right MCA encephalomalacia. ?3. Stable remote lacunar infarct of the left lentiform nucleus. ?4. Remote encephalomalacia of the anterior inferior left  frontal ?lobe is stable. ?5. Left frontal craniotomy with stable positioning of aneurysm clip ?adjacent to the left ICA ? ?Renal stone CT showed the following: ?1. Persistent or recurrent significant abnormality surrounding the ?right hip. There appears to be a persistent large abscess and ?possibly some component of hematoma. ?2. No obstructing ureteral calculi or bladder calculi. ?3. No significant acute intra-abdominal/intrapelvic process. ?4. Stable 4.5 x 4.3 cm infrarenal abdominal aortic aneurysm. ?5. Aortic Atherosclerosis. ? ?The patient was given hydration with IV lactated Ringer for 1 L.  He will be admitted to a medical telemetry bed for further evaluation and management. ? ? ? ?PAST MEDICAL HISTORY:  ? ?Past Medical History:  ?Diagnosis Date  ?? AAA (abdominal aortic aneurysm) ([place]) 2020  ? a. 2020 U/S: 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 -plan medical managmment with tight bp control; b. 09/2020 Abd U/S: 4.1cm.  ?? Anxiety   ?? Aortic atherosclerosis ([place])   ?? Benign essential HTN 02/10/2020  ?? BPH (benign prostatic hyperplasia) 02/09/2020  ?? Brain aneurysm   ?? Cancer of upper lobe of left lung ([place]) 04/2020  ? a.) ENB/EBUS performed; Bx non-diagnostic. b.) presumed stage I NSCLC in the LUL; underwent SBRT (60 Gy over 5 fractions)  ?? Carotid arterial disease ([place])   ? a. 05/2017 s/p R carotid endarterectomy following CVA.  ?? 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 a. 2018 Echo: EF 30-35%; b. 02/2021 Echo: EF 55-60%, nl RV fxn. Triv MR. Asc Ao  13mm.  ?? CKD (chronic kidney disease), stage III ([place])   ?? Claudication [facility])   ? a. 09/2020 ABIs: R 0.91. L 0.99.  ?? COPD (chronic obstructive pulmonary disease) (HCC)   ?? Coronary artery disease   ? a. 06/2017 PCI ([place]): s/p PCI to the RCA. LAD 50, LCX 20.  ?? Depression   ?? 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 a. 02/2021 Asc Ao 92mm.  ?? Emphysema of lung ([place])   ?? History of blood transfusion   ?? HLD  (hyperlipidemia)   ?? IDA (iron deficiency anemia) 02/09/2020  ?? 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  ?? Inguinal hernia   ? right  ?? Ischemic cardiomyopathy   ?? Long term current use of anticoagulant   ? a.) apixaban  ?? Marijuana use, continuous 02/10/2020  ?? Nosebleed   ?? Osteoporosis   ?? PAF (paroxysmal atrial fibrillation) ([place])   ? a. 05/2017 Dx in setting of CVA. CHA2DS2VASc = 6-->Eliquis.  ?? Pre-diabetes   ?? Pulmonary emboli ([place]) 06/07/2017  ? Right lower lobe pulmonary embolism small segmental, multifocal multifocal pneumonia, mediastinal lymphadenopathy, moderate centrilobular emphysema  ?? Seizures ([place])   ?? Sigmoid diverticulosis   ? a.) CT 01/27/2021: severe  ?? Stroke [facility])   ? a. 05/2017 - [place] in [place]->prolonged hospitalization in setting of R CEA, PE, and finding of RCA dzs on cath; b. Residual left sided weakness to arm and leg.  ? ? ?PAST SURGICAL HISTORY:  ? ?Past Surgical History:  ?Procedure Laterality Date  ?? brain aneurysm with clip    ?? COLONOSCOPY WITH PROPOFOL N/A 07/23/2020  ? Procedure: COLONOSCOPY WITH PROPOFOL;  Surgeon: Lin Landsman, MD;  [facility];  Service: Gastroenterology;  Laterality: N/A;  ?? CORONARY ANGIOPLASTY WITH STENT PLACEMENT  07/08/2017  ?? INCISION AND DRAINAGE HIP Right 10/16/2021  ? Procedure: IRRIGATION AND DEBRIDEMENT OF INFECTED TOTAL HIP;  Surgeon: Hessie Knows, MD;  Location: [facility] ORS;  Service: Orthopedics;  Laterality: Right;  ?? PORTA CATH INSERTION N/A 08/31/2021  ? Procedure: PORTA CATH INSERTION;  Surgeon: Algernon Huxley, MD;  Location: [place] CV LAB;  Service: Cardiovascular;  Laterality: N/A;  ?? TOTAL HIP ARTHROPLASTY Right   ?? VIDEO BRONCHOSCOPY WITH ENDOBRONCHIAL NAVIGATION N/A 04/23/2020  ? Procedure: VIDEO BRONCHOSCOPY WITH ENDOBRONCHIAL NAVIGATION;  Surgeon: Tyler Pita, MD;  Location: [facility] ORS;  Service: Pulmonary;  Laterality: N/A;  ?? VIDEO BRONCHOSCOPY WITH ENDOBRONCHIAL ULTRASOUND Left  08/12/2021  ? Procedure: VIDEO BRONCHOSCOPY WITH ENDOBRONCHIAL ULTRASOUND;  Surgeon: Tyler Pita, MD;  Location: [facility] ORS;  Service: Cardiopulmonary;  Laterality: Left;  ? ? ?SOCIAL HISTORY:  ? ?Social History  ? ?Tobacco Use  ?? Smoking status: Former  ?  Packs/day: 2.00  ?  Years: 53.00  ?  Pack years: 106.00  ?  Types: Cigarettes  ?  Quit date: 05/28/2017  ?  Years since quitting: 4.4  ?? Smokeless tobacco: Never  ?? Tobacco comments:  ?  Quit in 2018  ?Substance Use Topics  ?? Alcohol use: Not Currently  ? ? ?FAMILY HISTORY:  ? ?Family History  ?Problem Relation Age of Onset  ?? Diabetes Mother   ?? Heart disease Mother   ?? Stroke Father   ?? Heart disease Father   ?? Heart attack Father   ?? Alcohol abuse Father   ?? Heart disease Sister   ?? Heart attack Sister   ?? Heart disease Maternal Grandmother   ?? Heart attack Maternal Grandmother   ??  Heart attack Paternal Grandmother   ?? Diabetes Brother   ?? Heart disease Brother   ?? Heart attack Brother   ? ? ?DRUG ALLERGIES:  ? ?Allergies  ?Allergen Reactions  ?? Tape Rash  ?  plastic  ?? Wound Dressing Adhesive Itching and Rash  ?  Gets stuck to skin, makes wound spread  ? ? ?REVIEW OF SYSTEMS:  ? ?ROS ?As per history of present illness. All pertinent systems were reviewed above. Constitutional, HEENT, cardiovascular, respiratory, GI, GU, musculoskeletal, neuro, psychiatric, endocrine, integumentary and hematologic systems were reviewed and are otherwise negative/unremarkable except for positive findings mentioned above in the HPI. ? ? ?MEDICATIONS AT HOME:  ? ?Prior to Admission medications   ?Medication Sig Start Date End Date Taking? Authorizing Provider  ?albuterol (PROVENTIL) (2.5 MG/3ML) 0.083% nebulizer solution Take 3 mLs (2.5 mg total) by nebulization every 6 (six) hours as needed for wheezing or shortness of breath. 10/13/20   Tyler Pita, MD  ?albuterol (VENTOLIN HFA) 108 (90 Base) MCG/ACT inhaler INHALE 2 PUFFS INTO THE LUNGS EVERY 6  HOURS AS NEEDED FOR WHEEZING OR SHORTNESS OF BREATH 09/30/21   Tyler Pita, MD  ?apixaban (ELIQUIS) 5 MG TABS tablet TAKE 1 TABLET(5 MG) BY MOUTH IN THE MORNING AND AT BEDTIME 08/20/21   Kate Sable, MD  ?a

## 2021-11-05 NOTE — Telephone Encounter (Signed)
Wife called and left message on voicemail that pt has appointment for labs this morning at 1030 am and she was wanting him to be seen in symptom management.  States pt has had nausea and vomiting for 2 days usually happens after receiving IV antibiotics.  Wife states pt is lethargic at times, having hot flashes alternating with being freezing cold.  States patient is not eating.  Wife is very concerned and states she can not pinpoint what is going on with him.   ? ?Mychart message was sent since receiving voicemail and wife was instructed to call EMS and transport to ED.  ?

## 2021-11-05 NOTE — Assessment & Plan Note (Addendum)
continue statin therapy at reduced dose to avoid hepatotoxicity w/ daptomycin ?

## 2021-11-05 NOTE — Assessment & Plan Note (Addendum)
Not in exacerbation as of admission, however needs fluids to treat AKI (Cr is increased from 4.6 yesterday 11/05/21 to 5.1 11/06/21,  6.1 on 11/07/21) ?Last Echo on file 02/2021 EF 55-60% ?Holding entresto and lasix for now given AKI ?Monitor I&O ?

## 2021-11-05 NOTE — Assessment & Plan Note (Addendum)
This is likely secondary to his AKI as well as septic right hip arthritis. ?Management as above. ?if worsening may consider neurologic w/u, at this point metabolic causes are more likely ?

## 2021-11-05 NOTE — Assessment & Plan Note (Addendum)
Likely contributing to metabolic encephalopathy and AKI ?IV cefepime --> cefrtiaxone per ID ?follow urine culture. ? ?

## 2021-11-06 ENCOUNTER — Encounter: Payer: Self-pay | Admitting: Infectious Diseases

## 2021-11-06 ENCOUNTER — Encounter: Payer: Self-pay | Admitting: Pulmonary Disease

## 2021-11-06 ENCOUNTER — Inpatient Hospital Stay: Payer: No Typology Code available for payment source

## 2021-11-06 DIAGNOSIS — N189 Chronic kidney disease, unspecified: Secondary | ICD-10-CM | POA: Diagnosis not present

## 2021-11-06 DIAGNOSIS — G9341 Metabolic encephalopathy: Secondary | ICD-10-CM | POA: Diagnosis not present

## 2021-11-06 DIAGNOSIS — N179 Acute kidney failure, unspecified: Secondary | ICD-10-CM | POA: Diagnosis not present

## 2021-11-06 LAB — CBC
HCT: 21.6 % — ABNORMAL LOW (ref 39.0–52.0)
HCT: 24.3 % — ABNORMAL LOW (ref 39.0–52.0)
Hemoglobin: 6.7 g/dL — ABNORMAL LOW (ref 13.0–17.0)
Hemoglobin: 7.6 g/dL — ABNORMAL LOW (ref 13.0–17.0)
MCH: 27.6 pg (ref 26.0–34.0)
MCH: 27.5 pg (ref 26.0–34.0)
MCHC: 31 g/dL (ref 30.0–36.0)
MCHC: 31.3 g/dL (ref 30.0–36.0)
MCV: 88.9 fL (ref 80.0–100.0)
MCV: 88 fL (ref 80.0–100.0)
Platelets: 184 10*3/uL (ref 150–400)
Platelets: 216 10*3/uL (ref 150–400)
RBC: 2.43 MIL/uL — ABNORMAL LOW (ref 4.22–5.81)
RBC: 2.76 MIL/uL — ABNORMAL LOW (ref 4.22–5.81)
RDW: 19.1 % — ABNORMAL HIGH (ref 11.5–15.5)
RDW: 19.5 % — ABNORMAL HIGH (ref 11.5–15.5)
WBC: 4.3 10*3/uL (ref 4.0–10.5)
WBC: 5 10*3/uL (ref 4.0–10.5)
nRBC: 0 % (ref 0.0–0.2)
nRBC: 0 % (ref 0.0–0.2)

## 2021-11-06 LAB — BASIC METABOLIC PANEL
Anion gap: 8 (ref 5–15)
BUN: 66 mg/dL — ABNORMAL HIGH (ref 8–23)
CO2: 23 mmol/L (ref 22–32)
Calcium: 8.7 mg/dL — ABNORMAL LOW (ref 8.9–10.3)
Chloride: 107 mmol/L (ref 98–111)
Creatinine, Ser: 5.1 mg/dL — ABNORMAL HIGH (ref 0.61–1.24)
GFR, Estimated: 11 mL/min — ABNORMAL LOW (ref >60)
Glucose, Bld: 97 mg/dL (ref 70–99)
Potassium: 4.2 mmol/L (ref 3.5–5.1)
Sodium: 138 mmol/L (ref 135–145)

## 2021-11-06 LAB — BASIC METABOLIC PANEL WITH GFR
Anion gap: 10 (ref 5–15)
BUN: 66 mg/dL — ABNORMAL HIGH (ref 8–23)
CO2: 20 mmol/L — ABNORMAL LOW (ref 22–32)
Calcium: 8.8 mg/dL — ABNORMAL LOW (ref 8.9–10.3)
Chloride: 108 mmol/L (ref 98–111)
Creatinine, Ser: 5.23 mg/dL — ABNORMAL HIGH (ref 0.61–1.24)
GFR, Estimated: 11 mL/min — ABNORMAL LOW (ref >60)
Glucose, Bld: 106 mg/dL — ABNORMAL HIGH (ref 70–99)
Potassium: 4.7 mmol/L (ref 3.5–5.1)
Sodium: 138 mmol/L (ref 135–145)

## 2021-11-06 LAB — HEMOGLOBIN AND HEMATOCRIT, BLOOD
HCT: 26.8 % — ABNORMAL LOW (ref 39.0–52.0)
Hemoglobin: 8.4 g/dL — ABNORMAL LOW (ref 13.0–17.0)

## 2021-11-06 LAB — CK: Total CK: 416 U/L — ABNORMAL HIGH (ref 49–397)

## 2021-11-06 LAB — PREPARE RBC (CROSSMATCH)

## 2021-11-06 MED ORDER — SODIUM CHLORIDE 0.9 % IV SOLN
650.0000 mg | INTRAVENOUS | Status: DC
  Administered 2021-11-06 – 2021-11-19 (×7): 650 mg via INTRAVENOUS
  Filled 2021-11-06 (×9): qty 13

## 2021-11-06 MED ORDER — SODIUM CHLORIDE 0.9% IV SOLUTION: Freq: Once | INTRAVENOUS | Status: AC

## 2021-11-06 MED ORDER — SODIUM CHLORIDE 0.9 % IV SOLN
2.0000 g | INTRAVENOUS | Status: DC
  Administered 2021-11-07 – 2021-11-19 (×12): 2 g via INTRAVENOUS
  Filled 2021-11-06 (×11): qty 20
  Filled 2021-11-06 (×2): qty 2

## 2021-11-06 MED ORDER — LIDOCAINE 5 % EX PTCH
2.0000 | MEDICATED_PATCH | CUTANEOUS | Status: DC
  Administered 2021-11-06 – 2021-11-19 (×13): 2 via TRANSDERMAL
  Filled 2021-11-06 (×14): qty 2

## 2021-11-06 MED ORDER — SODIUM CHLORIDE 0.9 % IV BOLUS
250.0000 mL | Freq: Once | INTRAVENOUS | Status: AC
  Administered 2021-11-06: 250 mL via INTRAVENOUS

## 2021-11-06 MED ORDER — HEPARIN SODIUM (PORCINE) 5000 UNIT/ML IJ SOLN: 5000.0000 [IU] | Freq: Three times a day (TID) | INTRAMUSCULAR | Status: DC

## 2021-11-06 MED ORDER — CHLORHEXIDINE GLUCONATE CLOTH 2 % EX PADS
6.0000 | MEDICATED_PAD | Freq: Every day | CUTANEOUS | Status: DC
  Administered 2021-11-07 – 2021-11-19 (×13): 6 via TOPICAL

## 2021-11-06 NOTE — Progress Notes (Signed)
?Progress Note ? ? ?Patient: Richard Wiggins ZOX:[id number] DOB: [birth year] DOA: 11/05/2021     1 ?DOS: the patient was seen and examined on 11/06/2021 ?  ?Brief hospital course: ?[name] is a 71 year old Caucasian male, PMH COPD, HTN, HLD, PE, seizure disorder, CVA, anxiety, recent diagnosis of septic right hip status post I&D on 10/16/2021 followed with IV antibiotic therapy cefepime/daptomycin.  He presented to ED 11/05/2021 with chief complaint altered mental status and weakness.  In ED, vital signs stable/hypertensive, creatinine increased from 1.82 on 10/28/2021 up to 4.63, CBC showed anemia close to baseline, urinalysis concerning for UTI, no concerns on EKG, noncontrast CT head no acute findings, renal stone CT showed persistent/recurrent significant abnormalities surrounding the right hip, persistent large abscess possibly some component of hematoma, also showing no ureteral calculi/bladder calculi/other acute intra-abdominal or intrapelvic process.  Patient treated with IV hydration, antibiotics ? ? ? ? ?Assessment and Plan: ? ?* Acute kidney injury superimposed on chronic kidney disease ([place]) ?Creatinine worse today 11/06/21 may consider nephrology consult (Cr is increased from 4.6 yesterday 11/05/21 to 5.1 today 11/06/21)  ?Pt had no urine output last shift and 300 cc in bladder scan, overnight received bolus fluids  ?Avoid nephrotoxins (holding home entresto,holding diuresis unless absolutely needed, holding home eliquis and DVT ppx w/ heparin for now) ?Monitor BMP  ?IV fluids w/ caution given CHF history  ? ?Septic arthritis of hip ([place]) ?now associated with right hip abscess. ?IV cefepime and daptomycin. ?orthopedic consultation - some fluid accumulation is expected, no surgery planned for now given his other risk factors but may consider IR intervention to aspirate the fluid/abscess  ? ?Acute metabolic encephalopathy ?This is likely secondary to his AKI as well as septic right hip  arthritis. ?Management as above. ? ?Acute lower UTI ?Likely contributing to metabolic encephalopathy and AKI ?IV cefepime. ?follow urine culture. ? ? ?Paroxysmal atrial fibrillation (HCC) ?Held eliquis d/t significant AKI and possible need for surgery ?Pt on heparin now for VTE ppx  ? ?Dyslipidemia ?continue statin therapy. ? ?Normocytic anemia ?Recent surgery, some anemia expected  ?Hgb 8 on admission 11/05/21, this AM 11/06/21 Hgb 6.7 possible dilutional however may be contributing to hypoperfusion related AKI ?Will d/w pt re: blood transfusion ? ?Chronic systolic CHF (congestive heart failure) ([place]) ?Not in exacerbation as of admission, however needs fluids to treat AKI and (Cr is increased from 4.6 yesterday 11/05/21 to 5.1 today 11/06/21) ?Last Echo on file 02/2021 EF 55-60% ?Holding entresto and lasix for now given AKI ?Monitor I&O ? ? ? ? ?  ? ?Subjective: Patient seen and examined resting comfortably on hospital medical floor, wife is present and provides most of the history.  Patient reports lower back pain but otherwise no complaints.  Patient/wife report that he has been significantly more tired lately.  Wife expresses concern about anemia, patient has been struggling with anemia issues for some time and was actually scheduled to have a bone marrow biopsy this upcoming Monday, 11/09/2021. ? ?Physical Exam: ?Vitals:  ? 11/05/21 2005 11/05/21 2322 11/06/21 0320 11/06/21 0739  ?BP: (!) 148/65 128/84 (!) 141/67   ?Pulse: 72 65 62   ?Resp: '14 15 16   ' ?Temp: 97.8 ?F (36.6 ?C) 98.1 ?F (36.7 ?C) 98 ?F (36.7 ?C)   ?TempSrc:      ?SpO2: 98% 96% 96% 99%  ?Weight:      ?Height:      ?Constitutional:  ?VSS, see nurse notes ?General Appearance: pale, alert, well-developed, well-nourished, NAD ?Neck: ?No masses, trachea  midline ?Respiratory: ?Normal respiratory effort ?Breath sounds normal, no wheeze/rhonchi/rales ?Cardiovascular: ?S1/S2 normal, no murmur/rub/gallop auscultated ?No JVD ?No lower extremity  edema ?Gastrointestinal: ?Nontender, no masses ?Musculoskeletal:  ?No clubbing/cyanosis of digits ?Neurological: ?No cranial nerve deficit on limited exam ?Psychiatric: ?Normal judgment/insight ?Normal mood and affect ? ? ?Data Reviewed: ?CT Head Wo Contrast ? ?Result Date: 11/05/2021 ?CLINICAL DATA:  Mental status change. EXAM: CT HEAD WITHOUT CONTRAST TECHNIQUE: Contiguous axial images were obtained from the base of the skull through the vertex without intravenous contrast. RADIATION DOSE REDUCTION: This exam was performed according to the departmental dose-optimization program which includes automated exposure control, adjustment of the mA and/or kV according to patient size and/or use of iterative reconstruction technique. COMPARISON:  CT head without contrast 02/01/2020 FINDINGS: Brain: Chronic right MCA encephalomalacia again noted. A remote lacunar infarct noted in the left lentiform nucleus. Remote encephalomalacia of the posterior right temporal and occipital lobe is stable. Remote encephalomalacia of the anterior inferior left frontal lobe is stable No acute infarct, hemorrhage, or mass lesion is present. Ex vacuo dilation of the right lateral ventricle noted. Wallerian degeneration noted in the right cerebral peduncle. The brainstem and cerebellum are otherwise within normal limits. Vascular: Left frontal craniotomy noted with stable positioning of aneurysm clip adjacent to the left ICA. Atherosclerotic calcifications are present within the cavernous internal carotid arteries bilaterally. No hyperdense vessel is present. Skull: Left frontal craniotomy noted.  Skull is otherwise intact. Sinuses/Orbits: The paranasal sinuses and mastoid air cells are clear. The globes and orbits are within normal limits. IMPRESSION: 1. No acute intracranial abnormality or significant interval change. 2. Stable remote right MCA encephalomalacia. 3. Stable remote lacunar infarct of the left lentiform nucleus. 4. Remote  encephalomalacia of the anterior inferior left frontal lobe is stable. 5. Left frontal craniotomy with stable positioning of aneurysm clip adjacent to the left ICA. Electronically Signed   By: San Morelle M.D.   On: 11/05/2021 14:53  ? ?CT Renal Stone Study ? ?Result Date: 11/05/2021 ?CLINICAL DATA:  Nephrolithiasis. EXAM: CT ABDOMEN AND PELVIS WITHOUT CONTRAST TECHNIQUE: Multidetector CT imaging of the abdomen and pelvis was performed following the standard protocol without IV contrast. RADIATION DOSE REDUCTION: This exam was performed according to the departmental dose-optimization program which includes automated exposure control, adjustment of the mA and/or kV according to patient size and/or use of iterative reconstruction technique. COMPARISON:  10/13/2021 FINDINGS: Lower chest: The lung bases are clear of an acute process. No worrisome pulmonary lesions or pleural effusion. The heart is normal in size. No pericardial effusion. Aortic and coronary artery calcifications are noted. Hepatobiliary: No hepatic lesions are identified without contrast. No intrahepatic biliary dilatation. Gallbladder is unremarkable. No common bile duct dilatation. Pancreas: No mass, inflammation or ductal dilatation. Spleen: Normal size.  No focal lesions. Adrenals/Urinary Tract: The adrenal glands are unremarkable and stable. No worrisome renal lesions or hydronephrosis. Small upper pole left renal cyst is stable. No obstructing ureteral calculi or bladder calculi. Stomach/Bowel: The stomach, duodenum, small bowel and colon are grossly. Stable advanced sigmoid colon diverticulosis without findings for acute diverticulitis. Vascular/Lymphatic: Stable advanced atherosclerotic calcifications involving the aorta and branch vessels. Stable 4.5 x 4.3 cm infrarenal abdominal aortic aneurysm. Recommend follow-up CT/MR every 6 months and vascular consultation. This recommendation follows ACR consensus guidelines: White Paper of the  ACR Incidental Findings Committee II on Vascular Findings. J Am Coll Radiol 2013; 10:789-794. Reproductive: The prostate gland and seminal vesicles are unremarkable. Other: No pelvic mass or adenopathy. No free  pelvic fluid collections. No ingu

## 2021-11-06 NOTE — Assessment & Plan Note (Addendum)
Recent surgery, some anemia expected  ?Hgb improved post PRBC ?BMB 11/09/21, pathology pending, HemOnc following  ? ?

## 2021-11-06 NOTE — Progress Notes (Signed)
Patient seen with recent right hip I&D for infected total hip.  His hip has swelling and there is fluid collection which may or may not be infected at present.  Fluid collection is expected at 3 weeks postop.  At this time with his medical condition he is not really candidate for repeat surgery would be much more extensive surgery of redo go back into his hip to try to remove components.  We will continue to follow and if he is doing better medically Monday we can try a hip aspiration by interventional radiology to see if there is indeed persistent infection.  At this time he can be weight-bear as tolerated with physical therapy and mobilized as medically allowable. ?

## 2021-11-06 NOTE — Plan of Care (Signed)
  Problem: Health Behavior/Discharge Planning: Goal: Ability to manage health-related needs will improve Outcome: Progressing   

## 2021-11-06 NOTE — TOC Progression Note (Signed)
Transition of Care (TOC) - Progression Note  ? ? ?Patient Details  ?Name: Tykel Badie ?MRN: [id number] ?Date of Birth: May 16, 1951 ? ?Transition of Care (TOC) CM/SW Contact  ?[name], RN ?Phone Number: ?11/06/2021, 1:23 PM ? ?Clinical Narrative:    ? ? ?Transition of Care (TOC) Screening Note ? ? ?Patient Details  ?Name: Jackey Housey ?Date of Birth: 1950-08-07 ? ? ?Transition of Care (TOC) CM/SW Contact:    ?[name], RN ?Phone Number: ?11/06/2021, 1:24 PM ? ?Patient is independent at home ? ?Transition of Care Department [facility]) has reviewed patient and no TOC needs have been identified at this time. We will continue to monitor patient advancement through interdisciplinary progression rounds. If new patient transition needs arise, please place a TOC consult. ?  ? ?  ?  ? ?Expected Discharge Plan and Services ?  ?  ?  ?  ?  ?                ?  ?  ?  ?  ?  ?  ?  ?  ?  ?  ? ? ?Social Determinants of Health (SDOH) Interventions ?  ? ?Readmission Risk Interventions ?   ? View : No data to display.  ?  ?  ?  ? ? ?

## 2021-11-06 NOTE — Telephone Encounter (Signed)
Dr. Gonzalez, please advise. thanks 

## 2021-11-06 NOTE — Consult Note (Addendum)
NAME: Richard Wiggins  ?DOB: 08/08/1950  ?MRN: [id number]  ?Date/Time: 11/06/2021 2:13 PM ? ?REQUESTING PROVIDER: Dr.Alexander ?Subjective:  ?REASON FOR CONSULT: Rt hip PJI ??pt known to me from recent hospitalization ?Wife at bed side- chart reviewed- pt a limited historian- wife gave history ?Richard Wiggins is a 71 y.o. with a history of ?hypertension, aneurysm brain status post clipping,  COPD, coronary artery disease status post stent,hyperlipidemia, non-small cell CA lung on chemo, anemia status post transfusions, paroxysmal atrial fibrillation, CKD ,peripheral right hip arthroplasty recent hospitalization for rt hip joint effusion and being treated as PJI with neg culture on dapto and cefepime ?Is admitted because of high creatinine- was sent form the cancer center ?Pt was recently in hospital 4/7-4/11/23 for right hip effusion with a iliopsoas bursitis which was incidentally diagnosed on a CT of the chest and abdomen which was taken because he had fallen out of bed.  As outpatient he had aspiration of the hip which had 80,000 WBCs with 90% neutrophils.  So he was admitted to the hospital to undergo washout and replacement of the poly liner which was done on 10/16/2021.  The cultures were negative.  He was discharged home on daptomycin and cefepime.  After discharge patient had been to the emergency department On 10/21/2021 for shortness of breath and then on 10/28/2021 for anemia with hb of 7and received PRBC.  He was discharged from the ED and asked to follow-up with oncology.  He saw Dr. On 10/29/2021 and there was a plan to do bone marrow biopsy.  For the past few days patient has been confused as per his wife.  He has been weak.  Has not had a good appetite.  Is been getting his IV antibiotics regularly.  Yesterday he was at the cancer center and had labs done and the creatinine was high  and so he was asked to go to the ED.  Vitals in the ED BP of 128/84, temperature 98.1, heart rate 65 and sats 96%.  WBC  5.7, Hb 8, platelet 236 and creatinine was 4.63.  He had a CT abdomen which showed persistent abnormality surrounding the right hip.  Appears to be a persistent large collection either an abscess or hematoma.  No significant acute intra-abdominal or intrapelvic process to explain the high creatinine.  Stable 4.5 to 4.3 cm infrarenal abdominal aortic aneurysm.  I am asked to see the patient for the right prosthetic joint infection ? ? ?Past Medical History:  ?Diagnosis Date  ? AAA (abdominal aortic aneurysm) ([place]) 2020  ? a. 2020 U/S: 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 -plan medical managmment with tight bp control; b. 09/2020 Abd U/S: 4.1cm.  ? Anxiety   ? Aortic atherosclerosis ([place])   ? Benign essential HTN 02/10/2020  ? BPH (benign prostatic hyperplasia) 02/09/2020  ? Brain aneurysm   ? Cancer of upper lobe of left lung ([place]) 04/2020  ? a.) ENB/EBUS performed; Bx non-diagnostic. b.) presumed stage I NSCLC in the LUL; underwent SBRT (60 Gy over 5 fractions)  ? Carotid arterial disease ([place])   ? a. 05/2017 s/p R carotid endarterectomy following CVA.  ? 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 a. 2018 Echo: EF 30-35%; b. 02/2021 Echo: EF 55-60%, nl RV fxn. Triv MR. Asc Ao 17m.  ? CKD (chronic kidney disease), stage III ([place]   ? Claudication ([facility]   ? a. 09/2020 ABIs: R 0.91. L 0.99.  ? COPD (chronic obstructive pulmonary disease) ([place]   ?  Coronary artery disease   ? a. 06/2017 PCI ([place]): s/p PCI to the RCA. LAD 50, LCX 20.  ? Depression   ? 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 a. 02/2021 Asc Ao 53m.  ? Emphysema of lung ([place]   ? History of blood transfusion   ? HLD (hyperlipidemia)   ? IDA (iron deficiency anemia) 02/09/2020  ? 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  ? Inguinal hernia   ? right  ? Ischemic cardiomyopathy   ? Long term current use of anticoagulant   ? a.) apixaban  ? Marijuana use, continuous 02/10/2020  ? Nosebleed   ? Osteoporosis   ? PAF (paroxysmal  atrial fibrillation) ([place]   ? a. 05/2017 Dx in setting of CVA. CHA2DS2VASc = 6-->Eliquis.  ? Pre-diabetes   ? Pulmonary emboli ([place] 06/07/2017  ? Right lower lobe pulmonary embolism small segmental, multifocal multifocal pneumonia, mediastinal lymphadenopathy, moderate centrilobular emphysema  ? Seizures ([place]   ? Sigmoid diverticulosis   ? a.) CT 01/27/2021: severe  ? Stroke ([facility]   ? a. 05/2017 - hospitalized in [place]->prolonged hospitalization in setting of R CEA, PE, and finding of RCA dzs on cath; b. Residual left sided weakness to arm and leg.  ?  ?Past Surgical History:  ?Procedure Laterality Date  ? brain aneurysm with clip    ? COLONOSCOPY WITH PROPOFOL N/A 07/23/2020  ? Procedure: COLONOSCOPY WITH PROPOFOL;  Surgeon: VLin Landsman MD;  [facility];  Service: Gastroenterology;  Laterality: N/A;  ? CORONARY ANGIOPLASTY WITH STENT PLACEMENT  07/08/2017  ? INCISION AND DRAINAGE HIP Right 10/16/2021  ? Procedure: IRRIGATION AND DEBRIDEMENT OF INFECTED TOTAL HIP;  Surgeon: MHessie Knows MD;  Location: [facility];  Service: Orthopedics;  Laterality: Right;  ? PORTA CATH INSERTION N/A 08/31/2021  ? Procedure: PORTA CATH INSERTION;  Surgeon: DAlgernon Huxley MD;  [facility];  Service: Cardiovascular;  Laterality: N/A;  ? TOTAL HIP ARTHROPLASTY Right   ? VIDEO BRONCHOSCOPY WITH ENDOBRONCHIAL NAVIGATION N/A 04/23/2020  ? Procedure: VIDEO BRONCHOSCOPY WITH ENDOBRONCHIAL NAVIGATION;  Surgeon: GTyler Pita MD;  Location: [facility] ORS;  Service: Pulmonary;  Laterality: N/A;  ? VIDEO BRONCHOSCOPY WITH ENDOBRONCHIAL ULTRASOUND Left 08/12/2021  ? Procedure: VIDEO BRONCHOSCOPY WITH ENDOBRONCHIAL ULTRASOUND;  Surgeon: GTyler Pita MD;  Location: [facility] ORS;  Service: Cardiopulmonary;  Laterality: Left;  ?  ?Social History  ? ?Socioeconomic History  ? Marital status: Married  ?  Spouse name: DButch Penny ? Number of children: Not on file  ? Years of education: Not on file  ? Highest education  level: High school graduate  ?Occupational History  ? Occupation: Retired  ?Tobacco Use  ? Smoking status: Former  ?  Packs/day: 2.00  ?  Years: 53.00  ?  Pack years: 106.00  ?  Types: Cigarettes  ?  Quit date: 05/28/2017  ?  Years since quitting: 4.4  ? Smokeless tobacco: Never  ? Tobacco comments:  ?  Quit in 2018  ?Vaping Use  ? Vaping Use: Never used  ?Substance and Sexual Activity  ? Alcohol use: Not Currently  ? Drug use: Not Currently  ?  Types: Marijuana  ?  Comment: last smoke x1 month ago  ? Sexual activity: Not Currently  ?Other Topics Concern  ? Not on file  ?Social History Narrative  ? Lives with wife. 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 Children x2 children and grandchildren grown.   ? ?Social Determinants of Health  ? ?  Financial Resource Strain: Low Risk   ? Difficulty of Paying Living Expenses: Not hard at all  ?Food Insecurity: No Food Insecurity  ? Worried About Charity fundraiser in the Last Year: Never true  ? Ran Out of Food in the Last Year: Never true  ?Transportation Needs: No Transportation Needs  ? Lack of Transportation (Medical): No  ? Lack of Transportation (Non-Medical): No  ?Physical Activity: Inactive  ? Days of Exercise per Week: 0 days  ? Minutes of Exercise per Session: 0 min  ?Stress: Stress Concern Present  ? Feeling of Stress : Rather much  ?Social Connections: Moderately Isolated  ? Frequency of Communication with Friends and Family: Never  ? Frequency of Social Gatherings with Friends and Family: Never  ? Attends Religious Services: Never  ? Active Member of Clubs or Organizations: Yes  ? Attends Archivist Meetings: Never  ? Marital Status: Married  ?Intimate Partner Violence: Not At Risk  ? Fear of Current or Ex-Partner: No  ? Emotionally Abused: No  ? Physically Abused: No  ? Sexually Abused: No  ?  ?Family History  ?Problem Relation Age of Onset  ? Diabetes Mother   ? Heart disease Mother   ? Stroke Father   ? Heart disease Father   ? Heart attack  Father   ? Alcohol abuse Father   ? Heart disease Sister   ? Heart attack Sister   ? Heart disease Maternal Grandmother   ? Heart attack Maternal Grandmother   ? Heart attack Paternal Grandmother   ? Diabetes

## 2021-11-06 NOTE — TOC Progression Note (Signed)
Transition of Care (TOC) - Progression Note  ? ? ?Patient Details  ?Name: Richard Wiggins ?MRN: [id number] ?Date of Birth: [birth year] ? ?Transition of Care (TOC) CM/SW Contact  ?[name], RN ?Phone Number: ?11/06/2021, 1:25 PM ? ?Clinical Narrative:    ? ?Open with [facility] ? ?Expected Discharge Plan: Home/Self Care ?Barriers to Discharge: Continued Medical Work up ? ?Expected Discharge Plan and Services ?Expected Discharge Plan: Home/Self Care ?  ?Discharge Planning Services: CM Consult ?  ?  ?                ?  ?DME Agency: NA ?  ?  ?  ?  ?  ?  ?  ?  ? ? ?Social Determinants of Health (SDOH) Interventions ?  ? ?Readmission Risk Interventions ?   ? View : No data to display.  ?  ?  ?  ? ? ?

## 2021-11-06 NOTE — Consult Note (Signed)
Pharmacy Antibiotic Note ? ?Richard Wiggins is a 71 y.o. male admitted on 11/05/2021 with AKI. Patient with PMH of septic total joint currently receiving IV cefepime and daptomycin outpatient. ?Current outpatient reigmen: end date 11/26/21 ?Cefepime 2 gram Q12H ?Daptomycin 650 mg Q24H ?Per patient: last doses of medication 11/05/21 AM ? ? Pharmacy has been consulted for Daptomycin and cefepime dosing. ? ?Plan: ?Continue cefepime 2g q24H ?Adjust Daptomycin to 650 mg Q48H ? ?Height: 5\' 10"  (177.8 cm) ?Weight: 78.9 kg (174 lb) ?IBW/kg (Calculated) : 73 ? ?Temp (24hrs), Avg:98 ?F (36.7 ?C), Min:97.7 ?F (36.5 ?C), Max:98.2 ?F (36.8 ?C) ? ?Recent Labs  ?Lab 11/05/21 ?1039 11/06/21 ?0505  ?WBC 5.7 4.3  ?CREATININE 4.63* 5.10*  ? ?  ?Estimated Creatinine Clearance: 13.7 mL/min (A) (by C-G formula based on SCr of 5.1 mg/dL (H)).   ? ?Allergies  ?Allergen Reactions  ? Tape Rash  ?  plastic  ? Wound Dressing Adhesive Itching and Rash  ?  Gets stuck to skin, makes wound spread  ? ? ?Antimicrobials this admission: ?4/27 Daptomycin >>  ?4/27 Cefepime >>  ? ?Dose adjustments this admission: ? ? ?Microbiology results: ? ?4/27 UCx: sent  ? ? ?Thank you for allowing pharmacy to be a part of this patient?s care. ? ?Darrick Penna, PharmD, BCPS ?Clinical Pharmacist   ?11/06/2021 9:10 AM ? ?

## 2021-11-06 NOTE — Progress Notes (Signed)
Cross Cover ?Patient with no urine output this shift and only 300 cc noted on bladder scan.  Creatinine up. Bolus of IV fluids ordered ? ?

## 2021-11-07 DIAGNOSIS — N179 Acute kidney failure, unspecified: Secondary | ICD-10-CM | POA: Diagnosis not present

## 2021-11-07 DIAGNOSIS — N189 Chronic kidney disease, unspecified: Secondary | ICD-10-CM | POA: Diagnosis not present

## 2021-11-07 LAB — URINE CULTURE: Culture: <10000 — AB

## 2021-11-07 LAB — CBC
HCT: 26.7 % — ABNORMAL LOW (ref 39.0–52.0)
Hemoglobin: 8.3 g/dL — ABNORMAL LOW (ref 13.0–17.0)
MCH: 27.4 pg (ref 26.0–34.0)
MCHC: 31.1 g/dL (ref 30.0–36.0)
MCV: 88.1 fL (ref 80.0–100.0)
Platelets: 189 10*3/uL (ref 150–400)
RBC: 3.03 MIL/uL — ABNORMAL LOW (ref 4.22–5.81)
RDW: 18.8 % — ABNORMAL HIGH (ref 11.5–15.5)
WBC: 4.7 10*3/uL (ref 4.0–10.5)
nRBC: 0 % (ref 0.0–0.2)

## 2021-11-07 LAB — TYPE AND SCREEN
ABO/RH(D): B POS
Antibody Screen: NEGATIVE
Unit division: 0

## 2021-11-07 LAB — BASIC METABOLIC PANEL
Anion gap: 12 (ref 5–15)
BUN: 70 mg/dL — ABNORMAL HIGH (ref 8–23)
CO2: 17 mmol/L — ABNORMAL LOW (ref 22–32)
Calcium: 8.5 mg/dL — ABNORMAL LOW (ref 8.9–10.3)
Chloride: 109 mmol/L (ref 98–111)
Creatinine, Ser: 6.11 mg/dL — ABNORMAL HIGH (ref 0.61–1.24)
GFR, Estimated: 9 mL/min — ABNORMAL LOW (ref >60)
Glucose, Bld: 89 mg/dL (ref 70–99)
Potassium: 4.1 mmol/L (ref 3.5–5.1)
Sodium: 138 mmol/L (ref 135–145)

## 2021-11-07 LAB — BPAM RBC
Blood Product Expiration Date: 202305042359
ISSUE DATE / TIME: 202304281621
Unit Type and Rh: 7300

## 2021-11-07 NOTE — Progress Notes (Signed)
Chaplain met with patient and wife. Both were tearful and receptive to Chaplain's visit.Pt. shared he is concerned about his wife should he transition.Wife is concerned about her husband emotional state and her wish for him is to come to peace with the will of God for this life.  ? ?Chaplain offer compassionate presence and prayer, per wife's request.  ? ?Please contact Chaplain services if needed.  ?

## 2021-11-07 NOTE — Progress Notes (Signed)
?Progress Note ? ? ?Patient: Richard Wiggins EYC:[id number] DOB: 08/25/1950 DOA: 11/05/2021     2 ?DOS: the patient was seen and examined on 11/07/2021 ?  ?Brief hospital course: ?Mr. Chadderdon is a 71 year old Caucasian male, PMH COPD, HTN, HLD, PE, seizure disorder, CVA, anxiety, recent diagnosis of septic right hip status post I&D on 10/16/2021 followed with IV antibiotic therapy cefepime/daptomycin.  He presented to ED 11/05/2021 with chief complaint altered mental status and weakness.   ?In ED, vital signs stable/hypertensive, creatinine increased from 1.82 on 10/28/2021 up to 4.63, CBC showed anemia close to baseline, urinalysis concerning for UTI, no concerns on EKG, noncontrast CT head no acute findings, renal stone CT showed persistent/recurrent significant abnormalities surrounding the right hip, persistent large abscess possibly some component of hematoma, also showing no ureteral calculi/bladder calculi/other acute intra-abdominal or intrapelvic process.  Patient treated with IV hydration, antibiotics.  ?11/06/21:  ?Anemia: underwent transfusion 1 unit PRBC d/t Hgb 8 on admission 11/05/21, AM 11/06/21 Hgb 6.7 possible dilutional however may be contributing to hypoperfusion related AKI, pt w/ chronic anemia and multiple PRBC transfusions. HemOnc planning for bone marrow biopsy and agree to follow patient while he is here. I ?Infection: ID saw patient, abx were adjusted for renal function and mental status (cefepime switched to ceftriaxone to avoid encephalopathy, daptomycin ordered q 48 hours), opinion that joint fluid collection more likely seroma as opposed to abscess.  ?Today 11/07/21: ?Renal: creatinine continues to increase, nephrology consulted, see their note for full details but concern for possible need for dialysis if not improving.  ? ? ?Consults:  ?Orthopedic Surgery  ?Infectious disease ?HemOnc ?Nephrology ? ?Procedures:  ?None at this time ? ? ? ?Assessment and Plan: ? ?* Acute kidney injury  superimposed on chronic kidney disease ([place]) ?Creatinine  is increased from 4.6 on 11/05/21 to 5.1 on 11/06/21, to 6.11 on 11/07/21 --> nephrology consulted ?Avoid nephrotoxins (holding home entresto, holding diuresis unless absolutely needed, holding home eliquis) ?Better urine output through 04/28 after IV bolus overnight 04/27-04/28 however Cr still as above ?Monitor BMP  ?IV fluids w/ caution given CHF history but kidneys are priority at this time no s/s CHF exacerbation ? ?Septic arthritis of hip ([place]) ?now associated with right hip fluid collection possibly seroma less likely abscess ?Per infectious disease recs: IV cefepime and daptomycin --> cefepime switched to ceftriaxone to avoid encephalopathy, daptomycin ordered q 48 hours  ?orthopedic consultation - some fluid accumulation is expected, no surgery planned for now given his other risk factors but may consider IR intervention to aspirate the fluid/abscess  ? ?Acute metabolic encephalopathy ?This is likely secondary to his AKI as well as septic right hip arthritis. ?Management as above. ?If not improving / if worsening may consider neurologic w/u, at this point metabolic causes are more likely ? ?Acute lower UTI ?Likely contributing to metabolic encephalopathy and AKI ?IV cefepime --> cefrtiaxone per ID ?follow urine culture. ? ? ?Paroxysmal atrial fibrillation (HCC) ?Held anticoagulation for now given anemia / bleed risk, may restart w/ heparin vs home eliquis pending renal improvement  ? ?Dyslipidemia ?continue statin therapy at reduced dose to avoid hepatotoxicity w/ daptomycin ? ?Normocytic anemia ?Recent surgery, some anemia expected  ?Hgb 8 on admission 11/05/21, AM 11/06/21 Hgb 6.7 possible dilutional however may be contributing to hypoperfusion related AKI ?Hgb improved post PRBC ?HemOnc to follow possible bone marrow bx while he is here (this was planned anyway for Monday 05/01) ? ? ?Chronic systolic CHF (congestive heart failure) ([place]) ?Not in  exacerbation as of admission, however needs fluids to treat AKI (Cr is increased from 4.6 yesterday 11/05/21 to 5.1 11/06/21,  6.1 on 11/07/21) ?Last Echo on file 02/2021 EF 55-60% ?Holding entresto and lasix for now given AKI ?Monitor I&O ? ? ? ? ?  ? ?Subjective: Patient seen and examined, resting in bed, no apparent distress though he does appear pale, appears anxious.  Wife is at bedside.  ? ?Physical Exam: ?Vitals:  ? 11/06/21 2038 11/06/21 2103 11/06/21 2107 11/07/21 0546  ?BP: (!) 165/70 (!) 164/67  (!) 158/62  ?Pulse: 61 60  (!) 55  ?Resp: _0 ?Temp: 97.8 ?F (36.6 ?C) 98.1 ?F (36.7 ?C)  (!) 97.4 ?F (36.3 ?C)  ?TempSrc:      ?SpO2: 98% 100% 98% 98%  ?Weight:      ?Height:      ?Constitutional:  ?VSS, see nurse notes ?General Appearance: pale, alert, well-developed, well-nourished, NAD ?Neck: ?No masses, trachea midline ?Respiratory: ?Normal respiratory effort ?Breath sounds normal, no wheeze/rhonchi/rales ?Cardiovascular: ?S1/S2 normal, no murmur/rub/gallop auscultated ?No JVD ?No lower extremity edema ?Gastrointestinal: ?Nontender, no masses ?Musculoskeletal:  ?No clubbing/cyanosis of digits ?Neurological: ?No cranial nerve deficit on limited exam ?Psychiatric: ?Normal judgment/insight ?Normal mood and affect ? ? ?Data Reviewed: ?CT Head Wo Contrast ? ?Result Date: 11/05/2021 ?CLINICAL DATA:  Mental status change. EXAM: CT HEAD WITHOUT CONTRAST TECHNIQUE: Contiguous axial images were obtained from the base of the skull through the vertex without intravenous contrast. RADIATION DOSE REDUCTION: This exam was performed according to the departmental dose-optimization program which includes automated exposure control, adjustment of the mA and/or kV according to patient size and/or use of iterative reconstruction technique. COMPARISON:  CT head without contrast 02/01/2020 FINDINGS: Brain: Chronic right MCA encephalomalacia again noted. A remote lacunar infarct noted in the left lentiform nucleus. Remote  encephalomalacia of the posterior right temporal and occipital lobe is stable. Remote encephalomalacia of the anterior inferior left frontal lobe is stable No acute infarct, hemorrhage, or mass lesion is present. Ex vacuo dilation of the right lateral ventricle noted. Wallerian degeneration noted in the right cerebral peduncle. The brainstem and cerebellum are otherwise within normal limits. Vascular: Left frontal craniotomy noted with stable positioning of aneurysm clip adjacent to the left ICA. Atherosclerotic calcifications are present within the cavernous internal carotid arteries bilaterally. No hyperdense vessel is present. Skull: Left frontal craniotomy noted.  Skull is otherwise intact. Sinuses/Orbits: The paranasal sinuses and mastoid air cells are clear. The globes and orbits are within normal limits. IMPRESSION: 1. No acute intracranial abnormality or significant interval change. 2. Stable remote right MCA encephalomalacia. 3. Stable remote lacunar infarct of the left lentiform nucleus. 4. Remote encephalomalacia of the anterior inferior left frontal lobe is stable. 5. Left frontal craniotomy with stable positioning of aneurysm clip adjacent to the left ICA. Electronically Signed   By: San Morelle M.D.   On: 11/05/2021 14:53  ? ?CT Renal Stone Study ? ?Result Date: 11/05/2021 ?CLINICAL DATA:  Nephrolithiasis. EXAM: CT ABDOMEN AND PELVIS WITHOUT CONTRAST TECHNIQUE: Multidetector CT imaging of the abdomen and pelvis was performed following the standard protocol without IV contrast. RADIATION DOSE REDUCTION: This exam was performed according to the departmental dose-optimization program which includes automated exposure control, adjustment of the mA and/or kV according to patient size and/or use of iterative reconstruction technique. COMPARISON:  10/13/2021 FINDINGS: Lower chest: The lung bases are clear of an acute process. No worrisome pulmonary lesions or pleural effusion. The heart is normal in  size. No pericardial effusion. Aortic and coronary artery calcifications are noted. Hepatobiliary: No hepatic lesions are identified without contrast. No intrahepatic biliary dilatation. Gallbladder is unremarkabl

## 2021-11-07 NOTE — Plan of Care (Signed)

## 2021-11-07 NOTE — Consult Note (Signed)
?[facility] ?CONSULT NOTE  ? ? ?Date: 11/07/2021      ?      ?      ?Patient Name:  Richard Wiggins  MRN: [id number]  ?DOB: 06-12-1951  Age / Sex: 71 y.o., male   ?      ?PCP: [facility]     ?      ?      ?Service Requesting Consult: Medicine      ?      ?      ?Reason for Consult: Acute kidney injury     ?      ? ?History of Present Illness: ?Patient is a 71 y.o. male with a PMHx of hypertension, coronary artery disease, congestive heart failure, COPD, history of lung cancer now with recurrence on radiation and chemotherapy, atrial fibrillation, s/p clipping of the brain aneurysm, chronic kidney disease with anemia now found to have worsening renal indicis and hence a renal consultation.  Patient recently had right hip arthroplasty followed by right hip joint effusion and is being treated for sepsis.  He was initially on daptomycin and cefepime.  Presently on daptomycin every other day along with cefepime. ?Patient developed acute kidney injury on the top of chronic kidney disease.  Initially the creatinine was 1.8 subsequently went up to 6.11 today with a GFR of 9.  His urine output has declined. ?He had a CT scan with renal stone survey which did not reveal any evidence of hydronephrosis. ?He was started on IV fluids presently with isotonic saline at 125 cc an hour. ?He was on diuretics and also Entresto which were discontinued at this time. ?He also had a history of worsening anemia and was given blood transfusion. ?Spoke to the patient and his wife at bedside. ?He denies any chest pain, shortness of breath or orthopnea at this time. ?There is no history of fever chills or headaches. ? ?Medications: ?Outpatient medications: ?Medications Prior to Admission  ?Medication Sig Dispense Refill Last Dose  ? apixaban (ELIQUIS) 5 MG TABS tablet TAKE 1 TABLET(5 MG) BY MOUTH IN THE MORNING AND AT BEDTIME 60 tablet 3 11/05/2021  ? aspirin 81 MG chewable tablet CHEW ONE TABLET BY MOUTH  DAILY FOR HEART   11/05/2021  ? atorvastatin (LIPITOR) 40 MG tablet TAKE 1 TABLET(40 MG) BY MOUTH AT BEDTIME (Patient taking differently: 20 mg.) 90 tablet 3 11/04/2021  ? Budeson-Glycopyrrol-Formoterol (BREZTRI AEROSPHERE) 160-9-4.8 MCG/ACT AERO Inhale 2 puffs into the lungs in the morning and at bedtime. 10.7 g 11 11/04/2021  ? carvedilol (COREG) 25 MG tablet Take 0.5 tablets (12.5 mg total) by mouth in the morning and at bedtime. 60 tablet 3 11/05/2021  ? ceFEPime (MAXIPIME) IVPB Inject 2 g into the vein every 12 (twelve) hours. Indication:  septic total joint ?First Dose: Yes ?Last Day of Therapy:  5/18/223 ?Labs - Once weekly:  CBC/D, CMP, CPK, ESR ?Fax weekly labs to [phone] ?Method of administration: IV Push ?Method of administration may be changed at the discretion of home infusion pharmacist based upon assessment of the patient and/or caregiver's ability to self-administer the medication ordered. 74 Units 0 11/05/2021  ? daptomycin (CUBICIN) IVPB Inject 650 mg into the vein daily. Indication:  septic total joint ?First Dose: Yes ?Last Day of Therapy:  11/26/21 ?Labs - Once weekly:  CBC/D, CMP, CPK, ESR ?Fax weekly labs to [phone] ?Method of administration: IV Push ?Method of administration may be changed at the discretion of home infusion  pharmacist based upon assessment of the patient and/or caregiver's ability to self-administer the medication ordered. 37 Units 0 11/05/2021  ? HYDROmorphone (DILAUDID) 2 MG tablet Take 0.5-1 tablets (1-2 mg total) by mouth every 4 (four) hours as needed for moderate pain (pain score 4-6). 20 tablet 0 Past Week  ? lamoTRIgine (LAMICTAL) 100 MG tablet Take 100 mg by mouth 2 (two) times daily.   Past Week  ? LORazepam (ATIVAN) 0.5 MG tablet Take 1 tablet (0.5 mg total) by mouth every 6 (six) hours as needed (Nausea or vomiting). 30 tablet 0 11/04/2021  ? ondansetron (ZOFRAN) 8 MG tablet Take 1 tablet (8 mg total) by mouth 2 (two) times daily as needed for refractory  nausea / vomiting. Start on day 3 after chemo. 30 tablet 1 11/05/2021  ? sacubitril-valsartan (ENTRESTO) 49-51 MG Take 0.5 tablets by mouth in the morning and at bedtime.   11/05/2021  ? tamsulosin (FLOMAX) 0.4 MG CAPS capsule Take 1 capsule (0.4 mg total) by mouth daily after supper. 90 capsule 0 11/04/2021  ? albuterol (PROVENTIL) (2.5 MG/3ML) 0.083% nebulizer solution Take 3 mLs (2.5 mg total) by nebulization every 6 (six) hours as needed for wheezing or shortness of breath. 75 mL 6 prn  ? albuterol (VENTOLIN HFA) 108 (90 Base) MCG/ACT inhaler INHALE 2 PUFFS INTO THE LUNGS EVERY 6 HOURS AS NEEDED FOR WHEEZING OR SHORTNESS OF BREATH 18 g 2 prn  ? baclofen (LIORESAL) 10 MG tablet Take 1 tablet (10 mg total) by mouth 3 (three) times daily as needed for muscle spasms. (Patient not taking: Reported on 10/29/2021) 30 each 1   ? Chlorhexidine Gluconate Cloth 2 % PADS Apply 6 each topically daily. (Patient not taking: Reported on 10/29/2021)     ? dexamethasone (DECADRON) 4 MG tablet Take 2 tablets (8 mg total) by mouth daily. Start the day after chemotherapy for 2 days. (Patient not taking: Reported on 10/29/2021) 30 tablet 1   ? furosemide (LASIX) 40 MG tablet Take 1 tablet (40 mg total) by mouth as needed. 90 tablet 2   ? heparin lock flush 100 UNIT/ML SOLN injection 5 mLs (500 Units total) by Intracatheter route daily as needed [facility]).     ? HYDROmorphone (DILAUDID) 2 MG tablet Take by mouth.     ? lidocaine-prilocaine (EMLA) cream Apply to affected area once 30 g 3 prn  ? Multiple Vitamin (MULTI-VITAMIN) tablet Take 1 tablet by mouth daily.     ? oxyCODONE (OXYCONTIN) 10 mg 12 hr tablet      ? polyethylene glycol (MIRALAX) 17 g packet Take 17 g by mouth daily. 14 each 0 prn  ? prochlorperazine (COMPAZINE) 10 MG tablet Take 1 tablet (10 mg total) by mouth every 6 (six) hours as needed (Nausea or vomiting). 30 tablet 1 prn  ? ? ?Discontinued Meds:   ?Medications Discontinued During This Encounter  ?Medication Reason  ?  albuterol (VENTOLIN HFA) 108 (90 Base) MCG/ACT inhaler 2 puff Duplicate  ? ondansetron (ZOFRAN) tablet 8 mg   ? magnesium hydroxide (MILK OF MAGNESIA) suspension 30 mL   ? daptomycin (CUBICIN) IVPB   ? ceFEPime (MAXIPIME) IVPB   ? ceFEPime (MAXIPIME) IVPB Reorder  ? daptomycin (CUBICIN) IVPB Reorder  ? Budeson-Glycopyrrol-Formoterol 160-9-4.8 MCG/ACT AERO 2 puff Reorder  ? apixaban (ELIQUIS) tablet 5 mg   ? DAPTOmycin (CUBICIN) 650 mg in sodium chloride 0.9 % IVPB   ? heparin injection 5,000 Units   ? ceFEPIme (MAXIPIME) 2 g in sodium chloride 0.9 %  100 mL IVPB   ? ? ?Current medications: ?Current Facility-Administered Medications  ?Medication Dose Route Frequency Provider Last Rate Last Admin  ? 0.9 %  sodium chloride infusion   Intravenous Continuous Emeterio Reeve, DO 125 mL/hr at 11/07/21 1594 Infusion Verify at 11/07/21 5859  ? acetaminophen (TYLENOL) tablet 650 mg  650 mg Oral Q6H PRN Mansy, Jan A, MD      ? Or  ? acetaminophen (TYLENOL) suppository 650 mg  650 mg Rectal Q6H PRN Mansy, Jan A, MD      ? albuterol (PROVENTIL) (2.5 MG/3ML) 0.083% nebulizer solution 2.5 mg  2.5 mg Nebulization Q6H PRN Mansy, Jan A, MD      ? atorvastatin (LIPITOR) tablet 40 mg  40 mg Oral Daily Mansy, Jan A, MD   40 mg at 11/07/21 1039  ? baclofen (LIORESAL) tablet 5 mg  5 mg Oral TID PRN Mansy, Jan A, MD      ? bisacodyl (DULCOLAX) EC tablet 5 mg  5 mg Oral Daily PRN Mansy, Jan A, MD      ? budesonide (PULMICORT) nebulizer solution 0.25 mg  0.25 mg Nebulization BID Dorothe Pea, RPH   0.25 mg at 11/07/21 2924  ? carvedilol (COREG) tablet 12.5 mg  12.5 mg Oral BID Mansy, Jan A, MD   12.5 mg at 11/07/21 1039  ? cefTRIAXone (ROCEPHIN) 2 g in sodium chloride 0.9 % 100 mL IVPB  2 g Intravenous Q24H Ravishankar, Joellyn Quails, MD 200 mL/hr at 11/07/21 1050 2 g at 11/07/21 1050  ? Chlorhexidine Gluconate Cloth 2 % PADS 6 each  6 each Topical Daily Emeterio Reeve, DO   6 each at 11/07/21 1041  ? DAPTOmycin (CUBICIN) 650 mg in  sodium chloride 0.9 % IVPB  650 mg Intravenous Q48H Darrick Penna, RPH 126 mL/hr at 11/06/21 1212 650 mg at 11/06/21 1212  ? HYDROmorphone (DILAUDID) tablet 1-2 mg  1-2 mg Oral Q4H PRN Mansy, Arvella Merles, MD   2

## 2021-11-08 DIAGNOSIS — N179 Acute kidney failure, unspecified: Secondary | ICD-10-CM | POA: Diagnosis not present

## 2021-11-08 DIAGNOSIS — N189 Chronic kidney disease, unspecified: Secondary | ICD-10-CM | POA: Diagnosis not present

## 2021-11-08 LAB — BASIC METABOLIC PANEL
Anion gap: 8 (ref 5–15)
BUN: 71 mg/dL — ABNORMAL HIGH (ref 8–23)
CO2: 20 mmol/L — ABNORMAL LOW (ref 22–32)
Calcium: 8 mg/dL — ABNORMAL LOW (ref 8.9–10.3)
Chloride: 111 mmol/L (ref 98–111)
Creatinine, Ser: 6.1 mg/dL — ABNORMAL HIGH (ref 0.61–1.24)
GFR, Estimated: 9 mL/min — ABNORMAL LOW (ref >60)
Glucose, Bld: 96 mg/dL (ref 70–99)
Potassium: 3.7 mmol/L (ref 3.5–5.1)
Sodium: 139 mmol/L (ref 135–145)

## 2021-11-08 LAB — CBC
HCT: 23.2 % — ABNORMAL LOW (ref 39.0–52.0)
Hemoglobin: 7.3 g/dL — ABNORMAL LOW (ref 13.0–17.0)
MCH: 27.5 pg (ref 26.0–34.0)
MCHC: 31.5 g/dL (ref 30.0–36.0)
MCV: 87.5 fL (ref 80.0–100.0)
Platelets: 165 10*3/uL (ref 150–400)
RBC: 2.65 MIL/uL — ABNORMAL LOW (ref 4.22–5.81)
RDW: 19 % — ABNORMAL HIGH (ref 11.5–15.5)
WBC: 4.2 10*3/uL (ref 4.0–10.5)
nRBC: 0 % (ref 0.0–0.2)

## 2021-11-08 NOTE — Plan of Care (Signed)

## 2021-11-08 NOTE — Progress Notes (Signed)
?[facility]  ?PROGRESS NOTE  ? ?Subjective:  ? ?Out of bed to chair today.  He ate well today. ?Denies any chest pain, shortness of breath. ?Urine output is close to 1 L today. ? ?Objective:  ?Vital signs: ?Blood pressure (!) 167/71, pulse 81, temperature 97.8 ?F (36.6 ?C), resp. rate 16, height 5' 10" (1.778 m), weight 78.9 kg, SpO2 95 %. ? ?Intake/Output Summary (Last 24 hours) at 11/08/2021 1238 ?Last data filed at 11/08/2021 0754 ?Gross per 24 hour  ?Intake 878.49 ml  ?Output 900 ml  ?Net -21.51 ml  ? ?Filed Weights  ? 11/05/21 1218  ?Weight: 78.9 kg  ? ? ? ?Physical Exam: ?General:  No acute distress  ?Head:  Normocephalic, atraumatic. Moist oral mucosal membranes  ?Eyes:  Anicteric  ?Neck:  Supple  ?Lungs:   Clear to auscultation, normal effort  ?Heart:  S1S2 no rubs  ?Abdomen:   Soft, nontender, bowel sounds present  ?Extremities:  peripheral edema.  ?Neurologic:  Awake, alert, following commands  ?Skin:  No lesions  ?Access:   ? ? ?Basic Metabolic Panel: ?Recent Labs  ?Lab 11/05/21 ?1039 11/06/21 ?0505 11/06/21 ?1610 11/07/21 ?9604 11/08/21 ?5409  ?NA 134* 138 138 138 139  ?K 4.0 4.2 4.7 4.1 3.7  ?CL 102 107 108 109 111  ?CO2 22 23 20* 17* 20*  ?GLUCOSE 142* 97 106* 89 96  ?BUN 60* 66* 66* 70* 71*  ?CREATININE 4.63* 5.10* 5.23* 6.11* 6.10*  ?CALCIUM 9.1 8.7* 8.8* 8.5* 8.0*  ? ? ?CBC: ?Recent Labs  ?Lab 11/05/21 ?1039 11/06/21 ?0505 11/06/21 ?8119 11/06/21 ?2257 11/07/21 ?1478 11/08/21 ?2956  ?WBC 5.7 4.3 5.0  --  4.7 4.2  ?NEUTROABS 4.4  --   --   --   --   --   ?HGB 8.0* 6.7* 7.6* 8.4* 8.3* 7.3*  ?HCT 26.1* 21.6* 24.3* 26.8* 26.7* 23.2*  ?MCV 89.4 88.9 88.0  --  88.1 87.5  ?PLT 236 184 216  --  189 165  ? ? ? ?Urinalysis: ?Recent Labs  ?  11/05/21 ?1604  ?COLORURINE YELLOW*  ?LABSPEC 1.019  ?PHURINE 5.0  ?GLUCOSEU NEGATIVE  ?HGBUR MODERATE*  ?BILIRUBINUR NEGATIVE  ?KETONESUR 5*  ?PROTEINUR 100*  ?NITRITE NEGATIVE  ?LEUKOCYTESUR NEGATIVE  ?  ? ? ?Imaging: ?No results found. ? ? ?Medications:  ? ?  sodium chloride 125 mL/hr at 11/08/21 0946  ? cefTRIAXone (ROCEPHIN)  IV 2 g (11/08/21 0950)  ? DAPTOmycin (CUBICIN)  IV 650 mg (11/08/21 1122)  ? ? atorvastatin  40 mg Oral Daily  ? budesonide (PULMICORT) nebulizer solution  0.25 mg Nebulization BID  ? carvedilol  12.5 mg Oral BID  ? Chlorhexidine Gluconate Cloth  6 each Topical Daily  ? lidocaine  2 patch Transdermal Q24H  ? multivitamin with minerals  1 tablet Oral Daily  ? oxyCODONE  5 mg Oral Q12H  ? polyethylene glycol  17 g Oral Daily  ? tamsulosin  0.4 mg Oral Daily  ? umeclidinium-vilanterol  1 puff Inhalation Daily  ? ? ?Assessment/ Plan:  ?   ?Principal Problem: ?  Acute kidney injury superimposed on chronic kidney disease ([place]) ?Active Problems: ?  Chronic systolic CHF (congestive heart failure) ([place]) ?  Normocytic anemia ?  Septic arthritis of hip ([place]) ?  Dyslipidemia ?  Paroxysmal atrial fibrillation ([facility]) ?  Acute metabolic encephalopathy ?  Acute lower UTI ? ?71 y.o. male with a PMHx of hypertension, coronary artery disease, congestive heart failure, COPD, history of lung cancer now  with recurrence on radiation and chemotherapy, atrial fibrillation, s/p clipping of the brain aneurysm, chronic kidney disease with anemia now found to have worsening renal indicis and hence a renal consultation.  Patient recently had right hip arthroplasty followed by right hip joint effusion and is being treated for sepsis.  He was initially on daptomycin and cefepime.  Presently on daptomycin every other day along with cefepime. ?Patient developed acute kidney injury on the top of chronic kidney disease.  Initially the creatinine was 1.8 subsequently went up to 6.11 and plateaued today at the same level.   ?He had a CT scan with renal stone survey which did not reveal any evidence of hydronephrosis. ?He was started on IV fluids presently with isotonic saline at 125 cc an hour. ? ?#1: Acute kidney injury: Patient with acute kidney injury on the top of chronic kidney  disease most likely secondary to severe prerenal azotemia due to decreased p.o. intake on the top of being on diuretics and Entresto.  There may be a component of ATN.  The urine output has improved today.  I will continue the IV fluids with isotonic saline at 125 cc an hour and monitor urine output closely.  Dose all the medications and antibiotics to be a EGFR of less than 20 cc/min ?  ?#2: Sepsis: ID note appreciated. Continue the antibiotics as per their advice.  Will monitor renal indicis closely while on daptomycin. ?  ?#3: Anemia: Continue to monitor closely and transfuse as needed per hematology.  Work-up in progress. ?  ?#4: Hypertension: Continue carvedilol at the present doses.  May add amlodipine as needed. ?  ?I spoke to his wife at bedside in detail over the telephone.  Patient is more alert and awake today  Spoke to the patient in detail and answered all her questions to her satisfaction.   ?There is no acute need for initiation of renal replacement therapy.  ? ? LOS: 3 ?Richard Son, MD ?[facility] ?4/30/202312:38 PM ?  ?

## 2021-11-08 NOTE — Progress Notes (Signed)
?Progress Note ? ? ?Patient: Richard Wiggins UKG:[id number] DOB: [birth year] DOA: 11/05/2021     3 ?DOS: the patient was seen and examined on 11/08/2021 ?  ?Brief hospital course: ?Mr. Fiveash is a 71 year old Caucasian male, PMH COPD, HTN, HLD, PE, seizure disorder, CVA, anxiety, recent diagnosis of septic right hip status post I&D on 10/16/2021 followed with IV antibiotic therapy cefepime/daptomycin.  He presented to ED 11/05/2021 with chief complaint altered mental status and weakness.   ?In ED, vital signs stable/hypertensive, creatinine increased from 1.82 on 10/28/2021 up to 4.63, CBC showed anemia close to baseline, urinalysis concerning for UTI, no concerns on EKG, noncontrast CT head no acute findings, renal stone CT showed persistent/recurrent significant abnormalities surrounding the right hip, persistent large abscess possibly some component of hematoma, also showing no ureteral calculi/bladder calculi/other acute intra-abdominal or intrapelvic process.  Patient treated with IV hydration, antibiotics.  ?11/06/21:  ?Anemia: underwent transfusion 1 unit PRBC d/t Hgb 8 on admission 11/05/21, AM 11/06/21 Hgb 6.7 possible dilutional however may be contributing to hypoperfusion related AKI, pt w/ chronic anemia and multiple PRBC transfusions. HemOnc planning for bone marrow biopsy and agree to follow patient while he is here. I ?Infection: ID saw patient, abx were adjusted for renal function and mental status (cefepime switched to ceftriaxone to avoid encephalopathy, daptomycin ordered q 48 hours), opinion that joint fluid collection more likely seroma as opposed to abscess.  ?11/07/21:  ?Renal: creatinine continues to increase, nephrology consulted, see their note for full details but concern for possible need for dialysis if not improving.  ?Today 11/08/21:  ?Renal: creatinine plateau (peak yesterday 6.11, today 6.10), nephrology following, urine output has improved, plan is to continue IV fluids with isotonic  saline 125 cc an hour, monitor UOP ?Anemia: Hgb peak 8.3 yesterday, today 7.3, await HemOnc following and plan is for bone marrow biopsy tomorrow ?Infection: UCx <10K, Blood Cx x2 NGTD ?___ ? ? ? ?Consults:  ?Orthopedic Surgery  ?Infectious disease ?HemOnc ?Nephrology ? ?Procedures:  ?None at this time ? ? ? ?Assessment and Plan: ? ? ?Principal Problem: ?  Acute kidney injury superimposed on chronic kidney disease ([place]) likely ATN/prerenal azotemia d/t poor po intake and nephrotoxic Rx  ?Active Problems: ?  Septic arthritis of hip ([place]) ?  Acute metabolic encephalopathy ?  Acute lower UTI ?  Chronic systolic CHF (congestive heart failure) ([place]) ?  Normocytic anemia ?  Dyslipidemia ?  Paroxysmal atrial fibrillation (HCC) ? ? ?* Acute kidney injury superimposed on chronic kidney disease ([place]) likely ATN/prerenal azotemia d/t poor po intake and nephrotoxic Rx  ?Creatinine  is increased from 4.6 on 11/05/21 to 5.1 on 11/06/21, to 6.11 on 11/07/21 --> nephrology consulted ?Creatinine about the same on 11/08/2021, 6.10 ?Avoid nephrotoxins (holding home entresto, holding diuresis unless absolutely needed, holding home eliquis) ?urine output has improved as of 11/08/21, plan per nephro is to continue IV fluids with isotonic saline 125 cc an hour, monitor UOP ?Monitor BMP  ?IV fluids w/ caution given CHF history but kidneys are priority at this time no s/s CHF exacerbation ? ?Septic arthritis of hip ([place]) ?now associated with right hip fluid collection possibly seroma less likely abscess ?Per infectious disease recs: IV cefepime and daptomycin --> cefepime switched to ceftriaxone to avoid encephalopathy, daptomycin ordered q 48 hours  ?orthopedic consultation - some fluid accumulation is expected, no surgery planned for now given his other risk factors but may consider IR intervention to aspirate the fluid/abscess  ? ?Acute metabolic encephalopathy ?This is  likely secondary to his AKI as well as septic right hip  arthritis. ?Management as above. ?If not improving / if worsening may consider neurologic w/u, at this point metabolic causes are more likely ? ?Acute lower UTI ?Likely contributing to metabolic encephalopathy and AKI ?IV cefepime --> cefrtiaxone per ID ?follow urine culture. ? ? ?Paroxysmal atrial fibrillation (HCC) ?Held anticoagulation for now given anemia / bleed risk, may restart w/ heparin vs home eliquis pending renal improvement  ? ?Dyslipidemia ?continue statin therapy at reduced dose to avoid hepatotoxicity w/ daptomycin ? ?Normocytic anemia ?Recent surgery, some anemia expected  ?Hgb 8 on admission 11/05/21, AM 11/06/21 Hgb 6.7 possible dilutional however may be contributing to hypoperfusion related AKI ?Hgb improved post PRBC ?HemOnc to follow possible bone marrow bx while he is here (this was planned anyway for Monday 05/01) ? ? ?Chronic systolic CHF (congestive heart failure) ([place]) ?Not in exacerbation as of admission, however needs fluids to treat AKI (Cr is increased from 4.6 yesterday 11/05/21 to 5.1 11/06/21,  6.1 on 11/07/21) ?Last Echo on file 02/2021 EF 55-60% ?Holding entresto and lasix for now given AKI ?Monitor I&O ? ? ? ? ?  ? ?Subjective: \Patient seen and examined today resting comfortably in chair at bedside, eating breakfast.  Wife is present.  Patient reports he is feeling a bit improved and stronger from yesterday, appetite is back.  Urine output he reports improved ? ?Physical Exam: ?Vitals:  ? 11/07/21 2100 11/07/21 2347 11/08/21 0441 11/08/21 0757  ?BP:  (!) 161/70 (!) 161/77 (!) 167/71  ?Pulse:  68 72 81  ?Resp:  '19 20 16  ' ?Temp: 98.5 ?F (36.9 ?C) 97.9 ?F (36.6 ?C) 98 ?F (36.7 ?C) 97.8 ?F (36.6 ?C)  ?TempSrc: Oral Oral Oral   ?SpO2:  100% 92% 95%  ?Weight:      ?Height:      ?Constitutional:  ?VSS, see nurse notes ?General Appearance: pale, alert, well-developed, well-nourished, NAD ?Neck: ?No masses, trachea midline ?Respiratory: ?Normal respiratory effort ?Breath sounds normal,  no wheeze/rhonchi/rales ?Cardiovascular: ?S1/S2 normal, no murmur/rub/gallop auscultated ?No JVD ?No lower extremity edema ?Gastrointestinal: ?Nontender, no masses ?Musculoskeletal:  ?No clubbing/cyanosis of digits ?Neurological: ?No cranial nerve deficit on limited exam ?Psychiatric: ?Normal judgment/insight ?Normal mood and affect ? ? ?Data Reviewed: ?No results found. ?Results for orders placed or performed during the hospital encounter of 11/05/21 (from the past 48 hour(s))  ?Prepare RBC (crossmatch)     Status: None  ? Collection Time: 11/06/21  2:00 PM  ?Result Value Ref Range  ? Order Confirmation    ?  ORDER PROCESSED BY BLOOD BANK ?Performed at [facility], [address], [place], [place] [address] ?  ?Culture, blood (Routine X 2) w Reflex to ID Panel     Status: None (Preliminary result)  ? Collection Time: 11/06/21 10:57 PM  ? Specimen: BLOOD  ?Result Value Ref Range  ? Specimen Description BLOOD RIGHT ANTECUBITAL   ? Special Requests    ?  BOTTLES DRAWN AEROBIC AND ANAEROBIC Blood Culture adequate volume  ? Culture    ?  NO GROWTH 2 DAYS ?Performed at [facility], [address] [place], [place] [address] ?  ? Report Status PENDING   ?Hemoglobin and hematocrit, blood     Status: Abnormal  ? Collection Time: 11/06/21 10:57 PM  ?Result Value Ref Range  ? Hemoglobin 8.4 (L) 13.0 - 17.0 g/dL  ? HCT 26.8 (L) 39.0 - 52.0 %  ?  Comment: Performed at [facility], [address], [place], [place] [address]  ?Culture, blood (Routine X 2) w Reflex to ID Panel     Status: None (Preliminary result)  ? Collection Time: 11/06/21 11:08 PM  ? Specimen: BLOOD  ?Result Value Ref Range  ? Specimen Description BLOOD BLOOD RIGHT ARM   ? Special Requests    ?  BOTTLES DRAWN AEROBIC AND ANAEROBIC Blood Culture adequate volume  ? Culture    ?  NO GROWTH 2 DAYS ?Performed at [facility], [address], [place], [place] [address] ?  ? Report Status PENDING   ?CBC     Status: Abnormal   ? Collection Time: 11/07/21  9:09 AM  ?Result Value Ref Range  ? WBC 4.7 4.0 - 10.5 K/uL  ? RBC 3.03 (L) 4.22 - 5.81 MIL/uL  ? Hemoglobin 8.3 (L) 13.0 - 17.0 g/dL  ? HCT 26.7 (L) 39.0 - 52.0 %  ? MCV 88.1 80.0 - 100.

## 2021-11-08 NOTE — H&P (Incomplete)
? ?Chief Complaint: ?Patient was seen in consultation today for bone marrow biopsy and aspiration at the request of Rao,Archana C ? ?Referring Physician(s): ?Rao,Archana C ? ?Supervising Physician: Mir, Sharen Heck ? ?Patient Status: [place] - In-pt ? ?History of Present Illness: ?Richard Wiggins is a 71 y.o. male with past medical history of HTN, brain aneurysm, AAA, non-small cell lung cancer, CKD, COPD, CAD, HLD, IDA, PE on long-term anticoagulation, tobacco abuse, CVA and seizures.  Patient was recently diagnosed with septic right hip status post I&D 10/16/2021 followed with IV antibiotic therapy of cefepime/daptomycin. He presented to ED 11/05/2021 with chief complaint of AMS and weakness.  CT renal stone at that time showed persistent/recurrent significant abnormality surrounding the right hip.  Patient was admitted with large abscess with possible component of hematoma surrounding right hip and anemia, Hgb of 6.7.  Patient is currently being treated by oncology for non-small cell lung cancer with chemoradiation.  Due to extreme fatigue and frequency of blood transfusions approximately, every 10 days, Dr. Janese Banks has referred patient to IR for bone marrow biopsy and aspiration. ? ?Past Medical History:  ?Diagnosis Date  ? AAA (abdominal aortic aneurysm) ([place]) 2020  ? a. 2020 U/S: 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 -plan medical managmment with tight bp control; b. 09/2020 Abd U/S: 4.1cm.  ? Anxiety   ? Aortic atherosclerosis ([place])   ? Benign essential HTN 02/10/2020  ? BPH (benign prostatic hyperplasia) 02/09/2020  ? Brain aneurysm   ? Cancer of upper lobe of left lung ([place]) 04/2020  ? a.) ENB/EBUS performed; Bx non-diagnostic. b.) presumed stage I NSCLC in the LUL; underwent SBRT (60 Gy over 5 fractions)  ? Carotid arterial disease ([place])   ? a. 05/2017 s/p R carotid endarterectomy following CVA.  ? 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 a. 2018 Echo: EF 30-35%; b. 02/2021 Echo: EF 55-60%, nl  RV fxn. Triv MR. Asc Ao 46m.  ? CKD (chronic kidney disease), stage III ([facility]   ? Claudication ([facility]   ? a. 09/2020 ABIs: R 0.91. L 0.99.  ? COPD (chronic obstructive pulmonary disease) ([place]   ? Coronary artery disease   ? a. 06/2017 PCI ([place]): s/p PCI to the RCA. LAD 50, LCX 20.  ? Depression   ? 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 a. 02/2021 Asc Ao 32m  ? Emphysema of lung ([place]  ? History of blood transfusion   ? HLD (hyperlipidemia)   ? IDA (iron deficiency anemia) 02/09/2020  ? 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  ? Inguinal hernia   ? right  ? Ischemic cardiomyopathy   ? Long term current use of anticoagulant   ? a.) apixaban  ? Marijuana use, continuous 02/10/2020  ? Nosebleed   ? Osteoporosis   ? PAF (paroxysmal atrial fibrillation) ([facility] [place]  ? a. 05/2017 Dx in setting of CVA. CHA2DS2VASc = 6-->Eliquis.  ? Pre-diabetes   ? Pulmonary emboli ([facility] [place]11/27/2018  ? Right lower lobe pulmonary embolism small segmental, multifocal multifocal pneumonia, mediastinal lymphadenopathy, moderate centrilobular emphysema  ? Seizures ([facility] [place]  ? Sigmoid diverticulosis   ? a.) CT 01/27/2021: severe  ? [facility]  ? a. 05/2017 - hospitalized in [place]->prolonged hospitalization in setting of R CEA, PE, and finding of RCA dzs on cath; b. Residual left sided weakness to arm and leg.  ? ? ?Past Surgical History:  ?Procedure Laterality Date  ? brain aneurysm with clip    ?  COLONOSCOPY WITH PROPOFOL N/A 07/23/2020  ? Procedure: COLONOSCOPY WITH PROPOFOL;  Surgeon: Lin Landsman, MD;  [facility] END[facility]COPY;  Service: Gastroenterology;  Laterality: N/A;  ? CORONARY ANGIOPLASTY WITH STENT PLACEMENT  07/08/2017  ? INCISION AND DRAINAGE HIP Right 10/16/2021  ? Procedure: IRRIGATION AND DEBRIDEMENT OF INFECTED TOTAL HIP;  Surgeon: Hessie Knows, MD;  Location: [facility] ORS;  Service: Orthopedics;  Laterality: Right;  ? PORTA CATH INSERTION N/A 08/31/2021  ? Procedure: PORTA CATH INSERTION;  Surgeon: Algernon Huxley,  MD;  Location: [facility] LAB;  Service: Cardiovascular;  Laterality: N/A;  ? TOTAL HIP ARTHROPLASTY Right   ? VIDEO BRONCHOSCOPY WITH ENDOBRONCHIAL NAVIGATION N/A 04/23/2020  ? Procedure: VIDEO BRONCHOSCOPY WITH ENDOBRONCHIAL NAVIGATION;  Surgeon: Tyler Pita, MD;  Location: [facility] ORS;  Service: Pulmonary;  Laterality: N/A;  ? VIDEO BRONCHOSCOPY WITH ENDOBRONCHIAL ULTRASOUND Left 08/12/2021  ? Procedure: VIDEO BRONCHOSCOPY WITH ENDOBRONCHIAL ULTRASOUND;  Surgeon: Tyler Pita, MD;  Location: [facility] ORS;  Service: Cardiopulmonary;  Laterality: Left;  ? ? ?Allergies: ?Tape and Wound dressing adhesive ? ?Medications: ?Prior to Admission medications   ?Medication Sig Start Date End Date Taking? Authorizing Provider  ?albuterol (PROVENTIL) (2.5 MG/3ML) 0.083% nebulizer solution Take 3 mLs (2.5 mg total) by nebulization every 6 (six) hours as needed for wheezing or shortness of breath. 10/13/20   Tyler Pita, MD  ?albuterol (VENTOLIN HFA) 108 (90 Base) MCG/ACT inhaler INHALE 2 PUFFS INTO THE LUNGS EVERY 6 HOURS AS NEEDED FOR WHEEZING OR SHORTNESS OF BREATH 09/30/21   Tyler Pita, MD  ?apixaban (ELIQUIS) 5 MG TABS tablet TAKE 1 TABLET(5 MG) BY MOUTH IN THE MORNING AND AT BEDTIME 08/20/21   Kate Sable, MD  ?aspirin 81 MG chewable tablet CHEW ONE TABLET BY MOUTH DAILY FOR HEART 09/11/21   [provider]  ?atorvastatin (LIPITOR) 40 MG tablet TAKE 1 TABLET(40 MG) BY MOUTH AT BEDTIME ?Patient taking differently: 20 mg. 11/17/20   Chrismon, Vickki Muff, PA-C  ?baclofen (LIORESAL) 10 MG tablet Take 1 tablet (10 mg total) by mouth 3 (three) times daily as needed for muscle spasms. ?Patient not taking: Reported on 10/29/2021 09/12/21   Sindy Guadeloupe, MD  ?Budeson-Glycopyrrol-Formoterol (BREZTRI AEROSPHERE) 160-9-4.8 MCG/ACT AERO Inhale 2 puffs into the lungs in the morning and at bedtime. 12/30/20   Tyler Pita, MD  ?carvedilol (COREG) 25 MG tablet Take 0.5 tablets (12.5 mg total) by mouth in  the morning and at bedtime. 09/14/21   Kate Sable, MD  ?ceFEPime (MAXIPIME) IVPB Inject 2 g into the vein every 12 (twelve) hours. Indication:  septic total joint ?First Dose: Yes ?Last Day of Therapy:  5/18/223 ?Labs - Once weekly:  CBC/D, CMP, CPK, ESR ?Fax weekly labs to [phone] ?Method of administration: IV Push ?Method of administration may be changed at the discretion of home infusion pharmacist based upon assessment of the patient and/or caregiver's ability to self-administer the medication ordered. 10/20/21 11/26/21  Duanne Guess, PA-C  ?Chlorhexidine Gluconate Cloth 2 % PADS Apply 6 each topically daily. ?Patient not taking: Reported on 10/29/2021 10/21/21   Duanne Guess, PA-C  ?daptomycin (CUBICIN) IVPB Inject 650 mg into the vein daily. Indication:  septic total joint ?First Dose: Yes ?Last Day of Therapy:  11/26/21 ?Labs - Once weekly:  CBC/D, CMP, CPK, ESR ?Fax weekly labs to [phone] ?Method of administration: IV Push ?Method of administration may be changed at the discretion of home infusion pharmacist based upon assessment of the patient and/or  caregiver's ability to self-administer the medication ordered. 10/20/21 11/26/21  Duanne Guess, PA-C  ?dexamethasone (DECADRON) 4 MG tablet Take 2 tablets (8 mg total) by mouth daily. Start the day after chemotherapy for 2 days. ?Patient not taking: Reported on 10/29/2021 08/27/21   Sindy Guadeloupe, MD  ?furosemide (LASIX) 40 MG tablet Take 1 tablet (40 mg total) by mouth as needed. 03/13/21   Kate Sable, MD  ?heparin lock flush 100 UNIT/ML SOLN injection 5 mLs (500 Units total) by Intracatheter route daily as needed [facility]). 10/20/21   Duanne Guess, PA-C  ?HYDROmorphone (DILAUDID) 2 MG tablet Take 0.5-1 tablets (1-2 mg total) by mouth every 4 (four) hours as needed for moderate pain (pain score 4-6). 10/19/21   Duanne Guess, PA-C  ?HYDROmorphone (DILAUDID) 2 MG tablet Take by mouth. 11/03/21   [provider]   ?lamoTRIgine (LAMICTAL) 100 MG tablet Take 100 mg by mouth 2 (two) times daily. 08/22/20   [provider]  ?lidocaine-prilocaine (EMLA) cream Apply to affected area once 08/27/21   Sindy Guadeloupe,

## 2021-11-09 ENCOUNTER — Encounter: Payer: Self-pay | Admitting: Family Medicine

## 2021-11-09 ENCOUNTER — Ambulatory Visit: Payer: No Typology Code available for payment source

## 2021-11-09 ENCOUNTER — Inpatient Hospital Stay: Payer: No Typology Code available for payment source

## 2021-11-09 ENCOUNTER — Other Ambulatory Visit (INDEPENDENT_AMBULATORY_CARE_PROVIDER_SITE_OTHER): Payer: Self-pay | Admitting: Nurse Practitioner

## 2021-11-09 ENCOUNTER — Ambulatory Visit
Admission: RE | Admit: 2021-11-09 | Discharge: 2021-11-09 | Disposition: A | Discharge status: Home / Self Care | Payer: No Typology Code available for payment source | Source: Ambulatory Visit | Attending: Oncology | Admitting: Oncology

## 2021-11-09 DIAGNOSIS — T8450XD Infection and inflammatory reaction due to unspecified internal joint prosthesis, subsequent encounter: Secondary | ICD-10-CM

## 2021-11-09 DIAGNOSIS — N189 Chronic kidney disease, unspecified: Secondary | ICD-10-CM | POA: Diagnosis not present

## 2021-11-09 DIAGNOSIS — C3490 Malignant neoplasm of unspecified part of unspecified bronchus or lung: Secondary | ICD-10-CM

## 2021-11-09 DIAGNOSIS — N179 Acute kidney failure, unspecified: Secondary | ICD-10-CM | POA: Diagnosis not present

## 2021-11-09 LAB — CBC WITH DIFFERENTIAL/PLATELET
Abs Immature Granulocytes: 0.07 10*3/uL (ref 0.00–0.07)
Basophils Absolute: 0 10*3/uL (ref 0.0–0.1)
Basophils Relative: 1 %
Eosinophils Absolute: 0.3 10*3/uL (ref 0.0–0.5)
Eosinophils Relative: 6 %
HCT: 25.7 % — ABNORMAL LOW (ref 39.0–52.0)
Hemoglobin: 7.9 g/dL — ABNORMAL LOW (ref 13.0–17.0)
Immature Granulocytes: 2 %
Lymphocytes Relative: 10 %
Lymphs Abs: 0.4 10*3/uL — ABNORMAL LOW (ref 0.7–4.0)
MCH: 27.1 pg (ref 26.0–34.0)
MCHC: 30.7 g/dL (ref 30.0–36.0)
MCV: 88.3 fL (ref 80.0–100.0)
Monocytes Absolute: 0.3 10*3/uL (ref 0.1–1.0)
Monocytes Relative: 8 %
Neutro Abs: 3.2 10*3/uL (ref 1.7–7.7)
Neutrophils Relative %: 73 %
Platelets: 163 10*3/uL (ref 150–400)
RBC: 2.91 MIL/uL — ABNORMAL LOW (ref 4.22–5.81)
RDW: 19.6 % — ABNORMAL HIGH (ref 11.5–15.5)
WBC: 4.4 10*3/uL (ref 4.0–10.5)
nRBC: 0 % (ref 0.0–0.2)

## 2021-11-09 LAB — HEPATITIS B SURFACE ANTIBODY,QUALITATIVE: Hep B S Ab: NONREACTIVE

## 2021-11-09 LAB — BASIC METABOLIC PANEL
Anion gap: 10 (ref 5–15)
BUN: 72 mg/dL — ABNORMAL HIGH (ref 8–23)
CO2: 17 mmol/L — ABNORMAL LOW (ref 22–32)
Calcium: 8 mg/dL — ABNORMAL LOW (ref 8.9–10.3)
Chloride: 115 mmol/L — ABNORMAL HIGH (ref 98–111)
Creatinine, Ser: 6.56 mg/dL — ABNORMAL HIGH (ref 0.61–1.24)
GFR, Estimated: 8 mL/min — ABNORMAL LOW (ref >60)
Glucose, Bld: 101 mg/dL — ABNORMAL HIGH (ref 70–99)
Potassium: 3.9 mmol/L (ref 3.5–5.1)
Sodium: 142 mmol/L (ref 135–145)

## 2021-11-09 LAB — CK: Total CK: 877 U/L — ABNORMAL HIGH (ref 49–397)

## 2021-11-09 LAB — SEDIMENTATION RATE: Sed Rate: 127 mm/hr — ABNORMAL HIGH (ref 0–16)

## 2021-11-09 LAB — HEPATITIS B SURFACE ANTIGEN: Hepatitis B Surface Ag: NONREACTIVE

## 2021-11-09 MED ORDER — HEPARIN SOD (PORK) LOCK FLUSH 100 UNIT/ML IV SOLN
INTRAVENOUS | Status: AC
  Filled 2021-11-09: qty 5

## 2021-11-09 MED ORDER — FENTANYL CITRATE (PF) 100 MCG/2ML IJ SOLN
INTRAMUSCULAR | Status: AC | PRN
  Administered 2021-11-09: 50 ug via INTRAVENOUS

## 2021-11-09 MED ORDER — MIDAZOLAM HCL 2 MG/2ML IJ SOLN
INTRAMUSCULAR | Status: AC
  Filled 2021-11-09: qty 2

## 2021-11-09 MED ORDER — FENTANYL CITRATE (PF) 100 MCG/2ML IJ SOLN
INTRAMUSCULAR | Status: AC
  Filled 2021-11-09: qty 2

## 2021-11-09 MED ORDER — SCOPOLAMINE 1 MG/3DAYS TD PT72
1.0000 | MEDICATED_PATCH | TRANSDERMAL | Status: DC
  Administered 2021-11-09 – 2021-11-12 (×2): 1.5 mg via TRANSDERMAL
  Filled 2021-11-09 (×2): qty 1

## 2021-11-09 MED ORDER — SODIUM BICARBONATE 8.4 % IV SOLN
INTRAVENOUS | Status: DC
  Filled 2021-11-09 (×4): qty 1000
  Filled 2021-11-09: qty 150

## 2021-11-09 MED ORDER — LORAZEPAM 2 MG/ML IJ SOLN
0.5000 mg | Freq: Once | INTRAMUSCULAR | Status: AC
  Administered 2021-11-09: 0.5 mg via INTRAVENOUS
  Filled 2021-11-09: qty 1

## 2021-11-09 MED ORDER — MIDAZOLAM HCL 2 MG/2ML IJ SOLN
INTRAMUSCULAR | Status: AC | PRN
Start: 2021-11-09 — End: 2021-11-09
  Administered 2021-11-09: 1 mg via INTRAVENOUS

## 2021-11-09 NOTE — Progress Notes (Addendum)
?Richard Wiggins  ?PROGRESS NOTE  ? ?Subjective:  ? ?Patient seen sitting at side of bed, wife at bedside ?Currently n.p.o. for bone marrow biopsy ?According to wife, patient continues to have nausea which has persisted for 2 to 3 days. ?Wife states patient is depressed due to abnormal lab work ?Wife states and patient agrees he is not ready to die. ? ?Creatinine 6.56 ?Urine output 1.1 L in preceding 24 hours ?CO2 17 ?BUN 72 ? ?Objective:  ?Vital signs: ?Blood pressure (!) 169/99, pulse 78, temperature 97.6 ?F (36.4 ?C), temperature source Axillary, resp. rate 20, height '5\' 10"'  (1.778 m), weight 78.9 kg, SpO2 98 %. ? ?Intake/Output Summary (Last 24 hours) at 11/09/2021 1346 ?Last data filed at 11/09/2021 0810 ?Gross per 24 hour  ?Intake 1402.83 ml  ?Output 815 ml  ?Net 587.83 ml  ? ? ?Filed Weights  ? 11/05/21 1218  ?Weight: 78.9 kg  ? ? ? ?Physical Exam: ?General:  No acute distress, sitting at bedside  ?Head:  Normocephalic, atraumatic. Dry oral mucosal membranes  ?Eyes:  Anicteric  ?Lungs:  Clear to auscultation, normal effort  ?Heart:  S1S2 no rubs  ?Abdomen:   Soft, nontender, bowel sounds present  ?Extremities: No peripheral edema.  ?Neurologic:  Awake, alert, following commands  ?Skin:  No lesions  ?Access: None  ? ? ?Basic Metabolic Panel: ?Recent Labs  ?Lab 11/06/21 ?0505 11/06/21 ?6237 11/07/21 ?6283 11/08/21 ?1517 11/09/21 ?0505  ?NA 138 138 138 139 142  ?K 4.2 4.7 4.1 3.7 3.9  ?CL 107 108 109 111 115*  ?CO2 23 20* 17* 20* 17*  ?GLUCOSE 97 106* 89 96 101*  ?BUN 66* 66* 70* 71* 72*  ?CREATININE 5.10* 5.23* 6.11* 6.10* 6.56*  ?CALCIUM 8.7* 8.8* 8.5* 8.0* 8.0*  ? ? ? ?CBC: ?Recent Labs  ?Lab 11/05/21 ?1039 11/06/21 ?0505 11/06/21 ?6160 11/06/21 ?2257 11/07/21 ?7371 11/08/21 ?0626 11/09/21 ?0505  ?WBC 5.7 4.3 5.0  --  4.7 4.2 4.4  ?NEUTROABS 4.4  --   --   --   --   --  3.2  ?HGB 8.0* 6.7* 7.6* 8.4* 8.3* 7.3* 7.9*  ?HCT 26.1* 21.6* 24.3* 26.8* 26.7* 23.2* 25.7*  ?MCV 89.4 88.9 88.0  --  88.1 87.5 88.3   ?PLT 236 184 216  --  189 165 163  ? ? ? ? ?Urinalysis: ?No results for input(s): COLORURINE, LABSPEC, [place], GLUCOSEU, HGBUR, BILIRUBINUR, KETONESUR, PROTEINUR, UROBILINOGEN, NITRITE, LEUKOCYTESUR in the last 72 hours. ? ?Invalid input(s): APPERANCEUR ?  ? ? ?Imaging: ?CT BONE MARROW BIOPSY & ASPIRATION ? ?Result Date: 11/09/2021 ?INDICATION: Anemia EXAM: CT GUIDED BONE MARROW ASPIRATES AND BIOPSY MEDICATIONS: None. ANESTHESIA/SEDATION: Fentanyl 50 mcg IV; Versed 1 mg IV Moderate Sedation Time:  10 minutes The patient was continuously monitored during the procedure by the interventional radiology nurse under my direct supervision. COMPLICATIONS: None immediate. PROCEDURE: The procedure was explained to the patient. The risks and benefits of the procedure were discussed and the patient's questions were addressed. Informed consent was obtained from the patient. The patient was placed prone on CT table. Images of the pelvis were obtained. The right side of back was prepped and draped in sterile fashion. The skin and right posterior ilium were anesthetized with 1% lidocaine. 11 gauge bone needle was directed into the right ilium with CT guidance. Two aspirates and 1 core biopsy were obtained. Bandage placed over the puncture site. IMPRESSION: CT guided bone marrow aspiration and core biopsy. Electronically Signed   By: Danie Binder.D.  On: 11/09/2021 13:12   ? ? ?Medications:  ? ? cefTRIAXone (ROCEPHIN)  IV 2 g (11/09/21 0948)  ? DAPTOmycin (CUBICIN)  IV 650 mg (11/08/21 1122)  ? sodium bicarbonate 150 mEq in D5W infusion 50 mL/hr at 11/09/21 1219  ? ? atorvastatin  40 mg Oral Daily  ? budesonide (PULMICORT) nebulizer solution  0.25 mg Nebulization BID  ? carvedilol  12.5 mg Oral BID  ? Chlorhexidine Gluconate Cloth  6 each Topical Daily  ? fentaNYL      ? heparin lock flush      ? lidocaine  2 patch Transdermal Q24H  ? midazolam      ? multivitamin with minerals  1 tablet Oral Daily  ? oxyCODONE  5 mg Oral Q12H  ?  polyethylene glycol  17 g Oral Daily  ? scopolamine  1 patch Transdermal Q72H  ? tamsulosin  0.4 mg Oral Daily  ? umeclidinium-vilanterol  1 puff Inhalation Daily  ? ? ?Assessment/ Plan:  ?   ?Principal Problem: ?  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  COPD (chronic obstructive pulmonary disease) ([place]) ?  Chronic systolic CHF (congestive heart failure) ([place]) ?  Normocytic anemia ?  Septic arthritis of hip ([place]) ?  Dyslipidemia ?  Paroxysmal atrial fibrillation (HCC) ?  Acute metabolic encephalopathy ?  Acute lower UTI ? ?71 y.o. male with a PMHx of hypertension, coronary artery disease, congestive heart failure, COPD, history of lung cancer now with recurrence on radiation and chemotherapy, atrial fibrillation, s/p clipping of the brain aneurysm, chronic Wiggins disease with anemia now found to have worsening renal indicis and hence a renal consultation.  Patient recently had right hip arthroplasty followed by right hip joint effusion and is being treated for sepsis.  He was initially on daptomycin and cefepime.  Presently on daptomycin every other day along with cefepime. ?Patient developed acute Wiggins injury on the top of chronic Wiggins disease.  Initially the creatinine was 1.8 subsequently went up to 6.11 and plateaued today at the same level.   ?He had a CT scan with renal stone survey which did not reveal any evidence of hydronephrosis. ?He was started on IV fluids presently with isotonic saline at 125 cc an hour. ? ?#1: Acute Wiggins injury: Patient with acute Wiggins injury on the top of chronic Wiggins disease most likely secondary to severe prerenal azotemia due to decreased p.o. intake on the top of being on diuretics and Entresto.  There may be a component of ATN.  ? Creatinine slightly worse today with urine output 1.1 L.  Patient also exhibiting uremic symptoms with mild confusion and  tremors, BUN 72.  Discussed with patient and family that dialysis could be considered temporarily to help clear some toxins while further work-up in progress.  This may allow kidneys time to recover. Patient agreeable to proceed with dialysis. Consulted vascular surgery for permcath placement tomorrow. Will initiate dialysis after placement. Will decrease IVF to prevent fluid volume overload.  Dialysis coordinator aware of patient and will monitor for outpatient placement needs. Would recommend palliative care for goals of care discussion.  ?  ?#2: Sepsis: ID note appreciated. Ceftriaxone and Daptomycin prescribed.  ?  ?#3: Anemia: Continue to monitor closely and transfuse as needed per hematology.  Hgb improved to 7.9 today. ?  ?#4: Hypertension: Continue carvedilol at the present doses.  May add amlodipine as needed. ? BP remains  elevated, 169/99 ? ?#5 Acute metabolic acidosis. Bicarb 17. Will change IVF to Sodium bicarbonate at 69m/hr.  ? ? LOS: 4 ?SAbie[facility] ?5/1/20231:46 PM ?  ?

## 2021-11-09 NOTE — Progress Notes (Signed)
ID ?Pt is confused ?Wife not at bed side ?O/e ?Awake ?Responds to simple commands ?Pale ?Patient Vitals for the past 24 hrs: ? BP Temp Temp src Pulse Resp SpO2  ?11/09/21 2051 -- -- -- -- -- 95 %  ?11/09/21 2005 (!) 174/94 98.1 ?F (36.7 ?C) Oral 65 20 95 %  ?11/09/21 1732 (!) 154/66 98.4 ?F (36.9 ?C) -- 62 15 95 %  ?11/09/21 1132 (!) 169/99 -- -- 78 20 98 %  ?11/09/21 1115 (!) 170/100 -- -- 80 20 98 %  ?11/09/21 1111 (!) 169/84 -- -- 82 20 100 %  ?11/09/21 1106 (!) 172/85 -- -- 84 20 100 %  ?11/09/21 1048 (!) 174/95 -- -- 85 20 100 %  ?11/09/21 0758 (!) 179/95 97.6 ?F (36.4 ?C) Axillary 83 18 (!) 80 %  ?11/09/21 0515 -- -- -- -- -- 92 %  ?11/09/21 0501 (!) 151/77 97.8 ?F (36.6 ?C) -- 74 16 (!) 88 %  ?11/08/21 2236 (!) 175/77 98 ?F (36.7 ?C) -- 70 16 94 %  ?  ?Chest b/l air entry ?Hs1s2 ?Abd soft ?Rt hip- surgical site okay- some swelling ? ?Labs ? ?  Latest Ref Rng & Units 11/09/2021  ?  5:05 AM 11/08/2021  ?  5:09 AM 11/07/2021  ?  9:09 AM  ?CBC  ?WBC 4.0 - 10.5 K/uL 4.4   4.2   4.7    ?Hemoglobin 13.0 - 17.0 g/dL 7.9   7.3   8.3    ?Hematocrit 39.0 - 52.0 % 25.7   23.2   26.7    ?Platelets 150 - 400 K/uL 163   165   189    ?  ? ?  Latest Ref Rng & Units 11/09/2021  ?  5:05 AM 11/08/2021  ?  5:09 AM 11/07/2021  ?  9:09 AM  ?CMP  ?Glucose 70 - 99 mg/dL 101   96   89    ?BUN 8 - 23 mg/dL 72   71   70    ?Creatinine 0.61 - 1.24 mg/dL 6.56   6.10   6.11    ?Sodium 135 - 145 mmol/L 142   139   138    ?Potassium 3.5 - 5.1 mmol/L 3.9   3.7   4.1    ?Chloride 98 - 111 mmol/L 115   111   109    ?CO2 22 - 32 mmol/L '17   20   17    ' ?Calcium 8.9 - 10.3 mg/dL 8.0   8.0   8.5    ?CK8 77 ? ?Microbiology ?Blood culture from 10/29/2021 no growth ? ?Impression/recommendation ? ?AKI on CKD. ?Unclear etiology ?GI on board and planning nephro dialysis tomorrow ?For toxic avoid avoid agents ? ? 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  He was sent home on IV antibiotics cefepime  and daptomycin and the plan was to give him a total of 6 weeks.  Until 11/26/2021.  But now the antibiotics have been changed to ceftriaxone and daptomycin as adjusted for creatinine clearance. ? ?CK levels high.  May need to hold Dapto if it continues to increase ? ?Non-small cell CA lung.  Received 2 doses of chemotherapy and status post radiation ? ?History of cerebral aneurysm status post clipping ? ?Hyperlipidemia on atorvastatin which has been discontinued now ? ?Severe anemia status post PRBC underwent bone marrow biopsy today to look for etiology ? ?CHF on torsemide, Coreg and Entresto ? ?  Szymon encephalopathy secondary to AKI and medication. ? ?COPD ? ?Discussed the management with his nurse. ? ?

## 2021-11-09 NOTE — Progress Notes (Signed)
Progress Note   Patient: Richard Wiggins NWG:[id number] DOB: 1951-02-18 DOA: 11/05/2021     4 DOS: the patient was seen and examined on 11/09/2021   Brief hospital course: Mr. Ormond is a 71 year old Caucasian male, PMH COPD, HTN, HLD, PE, seizure disorder, CVA, anxiety, recent diagnosis of septic right hip status post I&D on 10/16/2021 followed with IV antibiotic therapy cefepime/daptomycin.  He presented to ED 11/05/2021 with chief complaint altered mental status and weakness.   In ED, vital signs stable/hypertensive, creatinine increased from 1.82 on 10/28/2021 up to 4.63, CBC showed anemia close to baseline, urinalysis concerning for UTI, no concerns on EKG, noncontrast CT head no acute findings, renal stone CT showed persistent/recurrent significant abnormalities surrounding the right hip, persistent large abscess possibly some component of hematoma, also showing no ureteral calculi/bladder calculi/other acute intra-abdominal or intrapelvic process.  Patient treated with IV hydration, antibiotics.  11/06/21:  Anemia: underwent transfusion 1 unit PRBC d/t Hgb 8 on admission 11/05/21, AM 11/06/21 Hgb 6.7 possible dilutional however may be contributing to hypoperfusion related AKI, pt w/ chronic anemia and multiple PRBC transfusions. HemOnc planning for bone marrow biopsy and agree to follow patient while he is here. I Infection: ID saw patient, abx were adjusted for renal function and mental status (cefepime switched to ceftriaxone to avoid encephalopathy, daptomycin ordered q 48 hours), opinion that joint fluid collection more likely seroma as opposed to abscess.  11/07/21:  Renal: creatinine continues to increase, nephrology consulted, see their note for full details but concern for possible need for dialysis if not improving.  11/08/21:  Renal: creatinine plateau (peak yesterday 6.11, today 6.10), nephrology following, urine output has improved, plan is to continue IV fluids with isotonic saline 125  cc an hour, monitor UOP Anemia: Hgb peak 8.3 yesterday, today 7.3, await HemOnc following and plan is for bone marrow biopsy tomorrow Infection: UCx <10K, Blood Cx x2 NGTD Today 11/09/21:  HemOnc: initially refusing BMB, I saw him early on rounds and asked him to reconsider, he is amenable. Contacted Onc team and they are aware. Added Rx for symptom management for anxiety, nausea. Will check up on him again later today after biopsy. Renal: Cr worse today to 6.5, nephrology to follow  ___    Consults:  Orthopedic Surgery  Infectious disease HemOnc Nephrology  Procedures:  None at this time    Assessment and Plan:   Principal Problem:   Acute kidney injury superimposed on chronic kidney disease (HCC) likely ATN/prerenal azotemia d/t poor po intake and nephrotoxic Rx  Active Problems:   Septic arthritis of hip (HCC)   Acute metabolic encephalopathy   Acute lower UTI   COPD (chronic obstructive pulmonary disease) (HCC)   Chronic systolic CHF (congestive heart failure) (HCC)   Normocytic anemia   Dyslipidemia   Paroxysmal atrial fibrillation (HCC)   * Acute kidney injury superimposed on chronic kidney disease (HCC) likely ATN/prerenal azotemia d/t poor po intake and nephrotoxic Rx  Creatinine  is increased from 4.6 on 11/05/21 to 5.1 on 11/06/21, to 6.11 on 11/07/21 --> nephrology consulted Creatinine about the same on 11/08/2021, 6.10 --> worse on 05/01 to 6.5 Avoid nephrotoxins (holding home entresto, holding diuresis unless absolutely needed, holding home eliquis) urine output has improved as of 11/08/21, plan per nephro is to continue IV fluids with isotonic saline 125 cc an hour, monitor UOP Monitor BMP  IV fluids w/ caution given CHF history but kidneys are priority at this time no s/s CHF exacerbation  Septic arthritis of hip (  HCC) now associated with right hip fluid collection possibly seroma less likely abscess Per infectious disease recs: IV cefepime and daptomycin  --> cefepime switched to ceftriaxone to avoid encephalopathy, daptomycin ordered q 48 hours  orthopedic consultation - some fluid accumulation is expected, no surgery planned for now given his other risk factors but may consider IR intervention to aspirate the fluid/abscess   Acute metabolic encephalopathy This is likely secondary to his AKI as well as septic right hip arthritis. Management as above. If not improving / if worsening may consider neurologic w/u, at this point metabolic causes are more likely  Acute lower UTI Likely contributing to metabolic encephalopathy and AKI IV cefepime --> cefrtiaxone per ID follow urine culture.   Paroxysmal atrial fibrillation (HCC) Held anticoagulation for now given anemia / bleed risk, may restart w/ heparin vs home eliquis pending renal improvement   Dyslipidemia continue statin therapy at reduced dose to avoid hepatotoxicity w/ daptomycin  Normocytic anemia Recent surgery, some anemia expected  Hgb 8 on admission 11/05/21, AM 11/06/21 Hgb 6.7 possible dilutional however may be contributing to hypoperfusion related AKI Hgb improved post PRBC Plan for BMB today 11/09/21    Chronic systolic CHF (congestive heart failure) (HCC) Not in exacerbation as of admission, however needs fluids to treat AKI (Cr is increased from 4.6 yesterday 11/05/21 to 5.1 11/06/21,  6.1 on 11/07/21) Last Echo on file 02/2021 EF 55-60% Holding entresto and lasix for now given AKI Monitor I&O  COPD (chronic obstructive pulmonary disease) (HCC) Declining to use his O2 and inhalers as of AM on 05/01, encouraged use of these         Subjective: Patient seen today, sitting on bedside, wife is with him, he is visibly upset, head in his hands, he is not wanting to go through with bone marrow biopsy. See above for discussion on this.   Physical Exam: Vitals:   11/08/21 2236 11/09/21 0501 11/09/21 0515 11/09/21 0758  BP: (!) 175/77 (!) 151/77  (!) 179/95  Pulse:  70 74  83  Resp: 16 16  18   Temp: 98 F (36.7 C) 97.8 F (36.6 C)  97.6 F (36.4 C)  TempSrc:    Axillary  SpO2: 94% (!) 88% 92% (!) 80%  Weight:      Height:      Constitutional:  VSS, see nurse notes. (Lower O2, not using his [place]) General Appearance: pale, alert Neck: No masses, trachea midline Respiratory: Normal respiratory effort Cardiovascular: No lower extremity edema Musculoskeletal:  No clubbing/cyanosis of digits Neurological: No cranial nerve deficit on limited exam Psychiatric: Fair judgment/insight Anxious/upset mood and affect    Data Reviewed: No results found. Results for orders placed or performed during the hospital encounter of 11/05/21 (from the past 48 hour(s))  CBC     Status: Abnormal   Collection Time: 11/08/21  5:09 AM  Result Value Ref Range   WBC 4.2 4.0 - 10.5 K/uL   RBC 2.65 (L) 4.22 - 5.81 MIL/uL   Hemoglobin 7.3 (L) 13.0 - 17.0 g/dL   HCT 24.4 (L) 01.0 - 27.2 %   MCV 87.5 80.0 - 100.0 fL   MCH 27.5 26.0 - 34.0 pg   MCHC 31.5 30.0 - 36.0 g/dL   RDW 53.6 (H) 64.4 - 03.4 %   Platelets 165 150 - 400 K/uL   nRBC 0.0 0.0 - 0.2 %    Comment: Performed at [facility], [address] [place], [place] [address]  Basic metabolic panel  Status: Abnormal   Collection Time: 11/08/21  5:09 AM  Result Value Ref Range   Sodium 139 135 - 145 mmol/L   Potassium 3.7 3.5 - 5.1 mmol/L   Chloride 111 98 - 111 mmol/L   CO2 20 (L) 22 - 32 mmol/L   Glucose, Bld 96 70 - 99 mg/dL    Comment: Glucose reference range applies only to samples taken after fasting for at least 8 hours.   BUN 71 (H) 8 - 23 mg/dL   Creatinine, Ser 0.10 (H) 0.61 - 1.24 mg/dL   Calcium 8.0 (L) 8.9 - 10.3 mg/dL   GFR, Estimated 9 (L) >60 mL/min    Comment: (NOTE) Calculated using the CKD-EPI Creatinine Equation (2021)    Anion gap 8 5 - 15    Comment: Performed at [facility], [address] [place], [place] [address]  CK     Status: Abnormal    Collection Time: 11/09/21  5:05 AM  Result Value Ref Range   Total CK 877 (H) 49 - 397 U/L    Comment: Performed at [facility], [address] [place], [place] [address]  Basic metabolic panel     Status: Abnormal   Collection Time: 11/09/21  5:05 AM  Result Value Ref Range   Sodium 142 135 - 145 mmol/L   Potassium 3.9 3.5 - 5.1 mmol/L   Chloride 115 (H) 98 - 111 mmol/L   CO2 17 (L) 22 - 32 mmol/L   Glucose, Bld 101 (H) 70 - 99 mg/dL    Comment: Glucose reference range applies only to samples taken after fasting for at least 8 hours.   BUN 72 (H) 8 - 23 mg/dL   Creatinine, Ser 3.47 (H) 0.61 - 1.24 mg/dL   Calcium 8.0 (L) 8.9 - 10.3 mg/dL   GFR, Estimated 8 (L) >60 mL/min    Comment: (NOTE) Calculated using the CKD-EPI Creatinine Equation (2021)    Anion gap 10 5 - 15    Comment: Performed at [facility], [address] [place], [place] [address]  CBC with Differential/Platelet     Status: Abnormal   Collection Time: 11/09/21  5:05 AM  Result Value Ref Range   WBC 4.4 4.0 - 10.5 K/uL   RBC 2.91 (L) 4.22 - 5.81 MIL/uL   Hemoglobin 7.9 (L) 13.0 - 17.0 g/dL   HCT 63.8 (L) 75.6 - 43.3 %   MCV 88.3 80.0 - 100.0 fL   MCH 27.1 26.0 - 34.0 pg   MCHC 30.7 30.0 - 36.0 g/dL   RDW 29.5 (H) 18.8 - 41.6 %   Platelets 163 150 - 400 K/uL   nRBC 0.0 0.0 - 0.2 %   Neutrophils Relative % 73 %   Neutro Abs 3.2 1.7 - 7.7 K/uL   Lymphocytes Relative 10 %   Lymphs Abs 0.4 (L) 0.7 - 4.0 K/uL   Monocytes Relative 8 %   Monocytes Absolute 0.3 0.1 - 1.0 K/uL   Eosinophils Relative 6 %   Eosinophils Absolute 0.3 0.0 - 0.5 K/uL   Basophils Relative 1 %   Basophils Absolute 0.0 0.0 - 0.1 K/uL   Immature Granulocytes 2 %   Abs Immature Granulocytes 0.07 0.00 - 0.07 K/uL    Comment: Performed at [facility], [address] [place], [place] [address]  Sedimentation rate     Status: Abnormal   Collection Time: 11/09/21  5:05 AM  Result Value Ref Range   Sed Rate  127 (  H) 0 - 16 mm/hr    Comment: Performed at [facility], [address] [place], [place] [address]     Family Communication: Wife is at bedside  Disposition: Status is: Inpatient Remains inpatient appropriate because significant risk of decompensation due to multiple comorbidities as outlined in assessment/plan,  Planned Discharge Destination:  TBD, hopefully home with home health but this will depend on clinical course, may require SNF    Time spent: 50 minutes much of which  was spent in discussion with oncology team and w/ patient/wife regarding above diagnoses  Author: Sunnie Nielsen, DO 11/09/2021 9:10 AM  For on call review [email or web address].

## 2021-11-09 NOTE — H&P (Signed)
Chief Complaint: Patient was seen in consultation today for bone marrow biopsy and aspiration at the request of Owens Shark, MD Referring Physician(s): Owens Shark, MD  Supervising Physician: Mir, Mauri Reading  Patient Status: [facility] - In-pt  History of Present Illness: Richard Wiggins is a 71 y.o. male with past medical history significant for COPD, HTN, HLD, PE, seizure disorder, CVA and anxiety.  Patient had recent diagnosis of septic right hip status post I&D on 10/16/2021 followed with IV antibiotic therapy cefepime/daptomycin.  Patient presented to ED 11/05/2021 complaining of altered mental status and weakness.  Patient was admitted after CT renal stone showed persistent/recurrent significant abnormalities surrounding the right hip with persistent large abscess possibly a component of hematoma.  Patient was also found to be anemic with a hemoglobin of 8 falling to 6.7 the following day.  Patient is currently being followed by oncology for non-small cell lung cancer and has received palliative chemoradiation treatment.  Patient has history of anemia requiring blood count transfusions approximately every 10 days.  Patient was referred to IR by Dr. Smith Robert for outpatient bone marrow biopsy.  Request made while patient is inpatient to perform biopsy. Past Medical History:  Diagnosis Date   AAA (abdominal aortic aneurysm) (HCC) 2020   a. 2020 U/S: 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 -plan medical managmment with tight bp control; b. 09/2020 Abd U/S: 4.1cm.   Anxiety    Aortic atherosclerosis (HCC)    Benign essential HTN 02/10/2020   BPH (benign prostatic hyperplasia) 02/09/2020   Brain aneurysm    Cancer of upper lobe of left lung (HCC) 04/2020   a.) ENB/EBUS performed; Bx non-diagnostic. b.) presumed stage I NSCLC in the LUL; underwent SBRT (60 Gy over 5 fractions)   Carotid arterial disease (HCC)    a. 05/2017 s/p R carotid endarterectomy following CVA.   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 2018 Echo: EF 30-35%; b. 02/2021 Echo: EF 55-60%, nl RV fxn. Triv MR. Asc Ao 38mm.   CKD (chronic kidney disease), stage III (HCC)    Claudication (HCC)    a. 09/2020 ABIs: R 0.91. L 0.99.   COPD (chronic obstructive pulmonary disease) (HCC)    Coronary artery disease    a. 06/2017 PCI ([place]): s/p PCI to the RCA. LAD 50, LCX 20.   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 02/2021 Asc Ao 38mm.   Emphysema of lung (HCC)    History of blood transfusion    HLD (hyperlipidemia)    IDA (iron deficiency anemia) 02/09/2020   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   Inguinal hernia    right   Ischemic cardiomyopathy    Long term current use of anticoagulant    a.) apixaban   Marijuana use, continuous 02/10/2020   Nosebleed    Osteoporosis    PAF (paroxysmal atrial fibrillation) (HCC)    a. 05/2017 Dx in setting of CVA. CHA2DS2VASc = 6-->Eliquis.   Pre-diabetes    Pulmonary emboli (HCC) 06/07/2017   Right lower lobe pulmonary embolism small segmental, multifocal multifocal pneumonia, mediastinal lymphadenopathy, moderate centrilobular emphysema   Seizures (HCC)    Sigmoid diverticulosis    a.) CT 01/27/2021: severe   Stroke [facility])    a. 05/2017 - hospitalized in [place]->prolonged hospitalization in setting of R CEA, PE, and finding of RCA dzs on cath; b. Residual left sided weakness to arm and leg.    Past Surgical History:  Procedure Laterality Date   brain aneurysm with clip     COLONOSCOPY WITH PROPOFOL N/A 07/23/2020   Procedure: COLONOSCOPY WITH PROPOFOL;  Surgeon: Toney Reil, MD;  Location: [facility] ENDOSCOPY;  Service: Gastroenterology;  Laterality: N/A;   CORONARY ANGIOPLASTY WITH STENT PLACEMENT  07/08/2017   INCISION AND DRAINAGE HIP Right 10/16/2021   Procedure: IRRIGATION AND DEBRIDEMENT OF INFECTED TOTAL HIP;  Surgeon: Kennedy Bucker, MD;  Location: [facility] ORS;  Service: Orthopedics;  Laterality: Right;   PORTA CATH  INSERTION N/A 08/31/2021   Procedure: PORTA CATH INSERTION;  Surgeon: Annice Needy, MD;  Location: [facility] INVASIVE CV LAB;  Service: Cardiovascular;  Laterality: N/A;   TOTAL HIP ARTHROPLASTY Right    VIDEO BRONCHOSCOPY WITH ENDOBRONCHIAL NAVIGATION N/A 04/23/2020   Procedure: VIDEO BRONCHOSCOPY WITH ENDOBRONCHIAL NAVIGATION;  Surgeon: Salena Saner, MD;  Location: [facility] ORS;  Service: Pulmonary;  Laterality: N/A;   VIDEO BRONCHOSCOPY WITH ENDOBRONCHIAL ULTRASOUND Left 08/12/2021   Procedure: VIDEO BRONCHOSCOPY WITH ENDOBRONCHIAL ULTRASOUND;  Surgeon: Salena Saner, MD;  Location: [facility] ORS;  Service: Cardiopulmonary;  Laterality: Left;    Allergies: Tape and Wound dressing adhesive  Medications: Prior to Admission medications   Medication Sig Start Date End Date Taking? Authorizing Provider  apixaban (ELIQUIS) 5 MG TABS tablet TAKE 1 TABLET(5 MG) BY MOUTH IN THE MORNING AND AT BEDTIME 08/20/21  Yes Agbor-Etang, Arlys John, MD  aspirin 81 MG chewable tablet CHEW ONE TABLET BY MOUTH DAILY FOR HEART 09/11/21  Yes [provider]  atorvastatin (LIPITOR) 40 MG tablet TAKE 1 TABLET(40 MG) BY MOUTH AT BEDTIME Patient taking differently: 20 mg. 11/17/20  Yes Chrismon, Jodell Cipro, PA-C  Budeson-Glycopyrrol-Formoterol (BREZTRI AEROSPHERE) 160-9-4.8 MCG/ACT AERO Inhale 2 puffs into the lungs in the morning and at bedtime. 12/30/20  Yes Salena Saner, MD  carvedilol (COREG) 25 MG tablet Take 0.5 tablets (12.5 mg total) by mouth in the morning and at bedtime. 09/14/21  Yes Agbor-Etang, Arlys John, MD  ceFEPime (MAXIPIME) IVPB Inject 2 g into the vein every 12 (twelve) hours. Indication:  septic total joint First Dose: Yes Last Day of Therapy:  5/18/223 Labs - Once weekly:  CBC/D, CMP, CPK, ESR Fax weekly labs to [phone] Method of administration: IV Push Method of administration may be changed at the discretion of home infusion pharmacist based upon assessment of the patient and/or caregiver's  ability to self-administer the medication ordered. 10/20/21 11/26/21 Yes Evon Slack, PA-C  daptomycin (CUBICIN) IVPB Inject 650 mg into the vein daily. Indication:  septic total joint First Dose: Yes Last Day of Therapy:  11/26/21 Labs - Once weekly:  CBC/D, CMP, CPK, ESR Fax weekly labs to [phone] Method of administration: IV Push Method of administration may be changed at the discretion of home infusion pharmacist based upon assessment of the patient and/or caregiver's ability to self-administer the medication ordered. 10/20/21 11/26/21 Yes Evon Slack, PA-C  HYDROmorphone (DILAUDID) 2 MG tablet Take 0.5-1 tablets (1-2 mg total) by mouth every 4 (four) hours as needed for moderate pain (pain score 4-6). 10/19/21  Yes Evon Slack, PA-C  lamoTRIgine (LAMICTAL) 100 MG tablet Take 100 mg by mouth 2 (two) times daily. 08/22/20  Yes [provider]  LORazepam (ATIVAN) 0.5 MG tablet Take 1 tablet (0.5 mg total) by mouth every 6 (six) hours as needed (Nausea or vomiting). 08/27/21  Yes Creig Hines, MD  ondansetron (ZOFRAN) 8 MG tablet Take 1 tablet (8 mg total) by mouth 2 (two) times daily  as needed for refractory nausea / vomiting. Start on day 3 after chemo. 08/27/21  Yes Creig Hines, MD  sacubitril-valsartan (ENTRESTO) 49-51 MG Take 0.5 tablets by mouth in the morning and at bedtime.   Yes [provider]  tamsulosin (FLOMAX) 0.4 MG CAPS capsule Take 1 capsule (0.4 mg total) by mouth daily after supper. 11/01/21  Yes Malva Limes, MD  albuterol (PROVENTIL) (2.5 MG/3ML) 0.083% nebulizer solution Take 3 mLs (2.5 mg total) by nebulization every 6 (six) hours as needed for wheezing or shortness of breath. 10/13/20   Salena Saner, MD  albuterol (VENTOLIN HFA) 108 (90 Base) MCG/ACT inhaler INHALE 2 PUFFS INTO THE LUNGS EVERY 6 HOURS AS NEEDED FOR WHEEZING OR SHORTNESS OF BREATH 09/30/21   Salena Saner, MD  baclofen (LIORESAL) 10 MG tablet Take 1 tablet (10 mg  total) by mouth 3 (three) times daily as needed for muscle spasms. Patient not taking: Reported on 10/29/2021 09/12/21   Creig Hines, MD  Chlorhexidine Gluconate Cloth 2 % PADS Apply 6 each topically daily. Patient not taking: Reported on 10/29/2021 10/21/21   Evon Slack, PA-C  dexamethasone (DECADRON) 4 MG tablet Take 2 tablets (8 mg total) by mouth daily. Start the day after chemotherapy for 2 days. Patient not taking: Reported on 10/29/2021 08/27/21   Creig Hines, MD  furosemide (LASIX) 40 MG tablet Take 1 tablet (40 mg total) by mouth as needed. 03/13/21   Debbe Odea, MD  heparin lock flush 100 UNIT/ML SOLN injection 5 mLs (500 Units total) by Intracatheter route daily as needed [facility]). 10/20/21   Evon Slack, PA-C  HYDROmorphone (DILAUDID) 2 MG tablet Take by mouth. 11/03/21   [provider]  lidocaine-prilocaine (EMLA) cream Apply to affected area once 08/27/21   Creig Hines, MD  Multiple Vitamin (MULTI-VITAMIN) tablet Take 1 tablet by mouth daily.    [provider]  oxyCODONE (OXYCONTIN) 10 mg 12 hr tablet  10/19/21   [provider]  polyethylene glycol (MIRALAX) 17 g packet Take 17 g by mouth daily. 10/13/21   Georga Hacking, MD  prochlorperazine (COMPAZINE) 10 MG tablet Take 1 tablet (10 mg total) by mouth every 6 (six) hours as needed (Nausea or vomiting). 08/27/21   Creig Hines, MD     Family History  Problem Relation Age of Onset   Diabetes Mother    Heart disease Mother    Stroke Father    Heart disease Father    Heart attack Father    Alcohol abuse Father    Heart disease Sister    Heart attack Sister    Heart disease Maternal Grandmother    Heart attack Maternal Grandmother    Heart attack Paternal Grandmother    Diabetes Brother    Heart disease Brother    Heart attack Brother     Social History   Socioeconomic History   Marital status: Married    Spouse name: Lupita Leash   Number of children: Not on file   Years  of education: Not on file   Highest education level: High school graduate  Occupational History   Occupation: Retired  Tobacco Use   Smoking status: Former    Packs/day: 2.00    Years: 53.00    Pack years: 106.00    Types: Cigarettes    Quit date: 05/28/2017    Years since quitting: 4.4   Smokeless tobacco: Never   Tobacco comments:    Quit in  2018  Vaping Use   Vaping Use: Never used  Substance and Sexual Activity   Alcohol use: Not Currently   Drug use: Not Currently    Types: Marijuana    Comment: last smoke x1 month ago   Sexual activity: Not Currently  Other Topics Concern   Not on file  Social History Narrative   Lives with wife. 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 Children x2 children and grandchildren grown.    Social Determinants of Health   Financial Resource Strain: Low Risk    Difficulty of Paying Living Expenses: Not hard at all  Food Insecurity: No Food Insecurity   Worried About Programme researcher, broadcasting/film/video in the Last Year: Never true   Ran Out of Food in the Last Year: Never true  Transportation Needs: No Transportation Needs   Lack of Transportation (Medical): No   Lack of Transportation (Non-Medical): No  Physical Activity: Inactive   Days of Exercise per Week: 0 days   Minutes of Exercise per Session: 0 min  Stress: Stress Concern Present   Feeling of Stress : Rather much  Social Connections: Moderately Isolated   Frequency of Communication with Friends and Family: Never   Frequency of Social Gatherings with Friends and Family: Never   Attends Religious Services: Never   Database administrator or Organizations: Yes   Attends Banker Meetings: Never   Marital Status: Married    Review of Systems: A 12 point ROS discussed and pertinent positives are indicated in the HPI above.  All other systems are negative.  Review of Systems  Respiratory:  Negative for shortness of breath.   Cardiovascular:  Negative for chest pain.   Gastrointestinal:  Positive for nausea.  Neurological:  Positive for weakness.   Vital Signs: BP (!) 179/95 (BP Location: Left Arm)   Pulse 83   Temp 97.6 F (36.4 C) (Axillary)   Resp 18   Ht 5\' 10"  (1.778 m)   Wt 174 lb (78.9 kg)   SpO2 (!) 80% Comment: Pt had taken off the nasal cannula and refused to put it back on.  BMI 24.97 kg/m   Physical Exam Constitutional:      General: He is not in acute distress.    Appearance: He is ill-appearing.  HENT:     Mouth/Throat:     Mouth: Mucous membranes are dry.     Pharynx: Oropharynx is clear.  Eyes:     Extraocular Movements: Extraocular movements intact.     Pupils: Pupils are equal, round, and reactive to light.  Cardiovascular:     Rate and Rhythm: Normal rate and regular rhythm.     Pulses: Normal pulses.     Heart sounds: Normal heart sounds.  Pulmonary:     Effort: Pulmonary effort is normal. No respiratory distress.     Breath sounds: Rhonchi present.     Comments: Rhonchi to LU field Abdominal:     General: Abdomen is flat. There is no distension.     Palpations: Abdomen is soft.     Tenderness: There is no abdominal tenderness. There is no guarding.  Musculoskeletal:     Right lower leg: No edema.     Left lower leg: No edema.  Skin:    General: Skin is warm and dry.  Neurological:     Mental Status: He is alert and oriented to person, place, and time.  Psychiatric:        Mood and Affect: Mood normal.  Behavior: Behavior normal.        Thought Content: Thought content normal.        Judgment: Judgment normal.    Imaging: DG Chest 2 View  Result Date: 10/21/2021 CLINICAL DATA:  Shortness of breath EXAM: CHEST - 2 VIEW COMPARISON:  10/13/2021 FINDINGS: Right-sided Port-A-Cath in satisfactory position. No focal consolidation. No pleural effusion or pneumothorax. Heart and mediastinal contours are unremarkable. No acute osseous abnormality. IMPRESSION: No active cardiopulmonary disease. Electronically  Signed   By: Elige Ko M.D.   On: 10/21/2021 14:21   CT Head Wo Contrast  Result Date: 11/05/2021 CLINICAL DATA:  Mental status change. EXAM: CT HEAD WITHOUT CONTRAST TECHNIQUE: Contiguous axial images were obtained from the base of the skull through the vertex without intravenous contrast. RADIATION DOSE REDUCTION: This exam was performed according to the departmental dose-optimization program which includes automated exposure control, adjustment of the mA and/or kV according to patient size and/or use of iterative reconstruction technique. COMPARISON:  CT head without contrast 02/01/2020 FINDINGS: Brain: Chronic right MCA encephalomalacia again noted. A remote lacunar infarct noted in the left lentiform nucleus. Remote encephalomalacia of the posterior right temporal and occipital lobe is stable. Remote encephalomalacia of the anterior inferior left frontal lobe is stable No acute infarct, hemorrhage, or mass lesion is present. Ex vacuo dilation of the right lateral ventricle noted. Wallerian degeneration noted in the right cerebral peduncle. The brainstem and cerebellum are otherwise within normal limits. Vascular: Left frontal craniotomy noted with stable positioning of aneurysm clip adjacent to the left ICA. Atherosclerotic calcifications are present within the cavernous internal carotid arteries bilaterally. No hyperdense vessel is present. Skull: Left frontal craniotomy noted.  Skull is otherwise intact. Sinuses/Orbits: The paranasal sinuses and mastoid air cells are clear. The globes and orbits are within normal limits. IMPRESSION: 1. No acute intracranial abnormality or significant interval change. 2. Stable remote right MCA encephalomalacia. 3. Stable remote lacunar infarct of the left lentiform nucleus. 4. Remote encephalomalacia of the anterior inferior left frontal lobe is stable. 5. Left frontal craniotomy with stable positioning of aneurysm clip adjacent to the left ICA. Electronically Signed    By: Marin Roberts M.D.   On: 11/05/2021 14:53   CT CHEST ABDOMEN PELVIS W CONTRAST  Result Date: 10/13/2021 CLINICAL DATA:  Lung cancer and weakness. Prior chemotherapy. Hemoptysis, fatigue, weakness. Nausea. * Tracking Code: BO * EXAM: CT CHEST, ABDOMEN, AND PELVIS WITH CONTRAST TECHNIQUE: Multidetector CT imaging of the chest, abdomen and pelvis was performed following the standard protocol during bolus administration of intravenous contrast. RADIATION DOSE REDUCTION: This exam was performed according to the departmental dose-optimization program which includes automated exposure control, adjustment of the mA and/or kV according to patient size and/or use of iterative reconstruction technique. CONTRAST:  OMNIPAQUE IOHEXOL 300 MG/ML  SOLN COMPARISON:  Multiple exams, including 09/19/2021 and PET-CT of 07/21/2021 FINDINGS: CT CHEST FINDINGS Cardiovascular: Right IJ Port-A-Cath tip: Lower SVC. Coronary, aortic arch, and branch vessel atherosclerotic vascular disease. Mediastinum/Nodes: One the previously hypermetabolic lower AP window lymph node currently measures about 3 mm short axis on image 30 series 2, formerly 0.9 cm in thickness on 07/21/2021. No definite lymph node observed along the previously small hypermetabolic focus in the left suprahilar region. Lungs/Pleura: Centrilobular emphysema. Post therapy related findings posteriorly in the left upper lobe with adjacent pleural thickening, unchanged. Airway thickening is present, suggesting bronchitis or reactive airways disease. Musculoskeletal: Unremarkable CT ABDOMEN PELVIS FINDINGS Hepatobiliary: Unremarkable Pancreas: Unremarkable Spleen: Unremarkable Adrenals/Urinary Tract: Both  adrenal glands appear normal. Stable hypodense renal lesions, some too small to characterize, others compatible with benign cysts. In general, such lesions are not felt to warrant further workup unless there is a specific renal clinical concern. Urinary bladder  unremarkable. Stomach/Bowel: Sigmoid colon diverticulosis. Vascular/Lymphatic: Atherosclerosis is present, including aortoiliac atherosclerotic disease. Atherosclerotic plaque causes narrowing at the origin of the celiac trunk and SMA, although neither appears overtly occluded. Infrarenal abdominal aortic aneurysm 4.6 cm transverse on image 84 series 2, stable. Atheromatous plaque in both renal arteries. High-grade atheromatous narrowing in both iliac arteries. Please note that today's exam was not a CT angiogram. Reproductive: Unremarkable Other: No supplemental non-categorized findings. Musculoskeletal: Right total hip prosthesis. There is a new large right hip effusion thought to likely be continuous with iliopsoas bursitis for example on images 108 through 128 of series 2. There is gas in the iliopsoas bursa which is probably in continuity with the hip joint, raising suspicion for a septic joint. There is chronic lucency in the right acetabulum along with some demineralization along the quadrilateral plate probably from chronic particulate disease which has been present to some degree since at least August 2021. Lumbar spondylosis and degenerative disc disease. IMPRESSION: 1. Right hip prosthesis with new large right hip effusion thought to be continuous with right iliopsoas bursitis, and with associated gas density in the iliopsoas bursa, concerning for septic right hip joint. Urgent referral for orthopedic management recommended. 2. There is also chronic lucency in the right acetabulum with some demineralization of the quadrilateral plate most attributable to chronic particulate disease. 3. The previously hypermetabolic adenopathy in the chest shown on the PET-CT from 02/12/2020 has markedly improved. 4. Airway thickening is present, suggesting bronchitis or reactive airways disease. 5. Post therapy related findings in the left upper lobe. 6. Marked atherosclerosis with high-grade narrowing in the iliac  arteries and substantial atherosclerotic plaque in the aorta and its branches. 7. Stable appearance of the 4.6 cm infrarenal abdominal aortic aneurysm. Recommend follow-up every 6 months and vascular consultation (if not already obtained).Reference: J Am Coll Radiol 2013;10:789-794. 8. Other imaging findings of potential clinical significance: Aortic Atherosclerosis (ICD10-I70.0). Coronary atherosclerosis. Emphysema (ICD10-J43.9). Sigmoid colon diverticulosis. Lumbar spondylosis and degenerative disc disease. These results (and in particular, the concerns regarding the right hip) were called by telephone at the time of interpretation on 10/13/2021 at 4:03 pm to provider [facility] , who verbally acknowledged these results. Electronically Signed   By: Gaylyn Rong M.D.   On: 10/13/2021 16:12   DG Chest Portable 1 View  Result Date: 10/13/2021 CLINICAL DATA:  cp EXAM: PORTABLE CHEST 1 VIEW COMPARISON:  Chest x-ray 10/11/2021, CT chest 10/13/2021 FINDINGS: Stable right chest wall Port-A-Cath with tip overlying the expected region of the superior cavoatrial junction. The heart and mediastinal contours are unchanged. Aortic calcification. No focal consolidation. Chronic coarsened markings with no overt pulmonary edema. No pleural effusion. No pneumothorax. No acute osseous abnormality. IMPRESSION: 1. No active disease. 2. Aortic Atherosclerosis (ICD10-I70.0) and Emphysema (ICD10-J43.9). Electronically Signed   By: Tish Frederickson M.D.   On: 10/13/2021 21:54   DG Chest Port 1 View  Result Date: 10/11/2021 CLINICAL DATA:  Sepsis EXAM: PORTABLE CHEST 1 VIEW COMPARISON:  09/20/2021 FINDINGS: Focal opacity within the left upper lung zone has nearly resolved with a small amount of residual parenchymal scarring. No new focal pulmonary infiltrates or nodules. No pneumothorax or pleural effusion. Right internal jugular chest port is again seen with its tip within the superior vena cava.  Cardiac size within normal  limits. Pulmonary vascularity is normal. IMPRESSION: No radiographic evidence of acute cardiopulmonary disease. Resolved focal opacity within the left upper lobe, in keeping with an infectious or inflammatory process, with a small amount of residual parenchymal scarring. Electronically Signed   By: Helyn Numbers M.D.   On: 10/11/2021 20:22   CT Renal Stone Study  Result Date: 11/05/2021 CLINICAL DATA:  Nephrolithiasis. EXAM: CT ABDOMEN AND PELVIS WITHOUT CONTRAST TECHNIQUE: Multidetector CT imaging of the abdomen and pelvis was performed following the standard protocol without IV contrast. RADIATION DOSE REDUCTION: This exam was performed according to the departmental dose-optimization program which includes automated exposure control, adjustment of the mA and/or kV according to patient size and/or use of iterative reconstruction technique. COMPARISON:  10/13/2021 FINDINGS: Lower chest: The lung bases are clear of an acute process. No worrisome pulmonary lesions or pleural effusion. The heart is normal in size. No pericardial effusion. Aortic and coronary artery calcifications are noted. Hepatobiliary: No hepatic lesions are identified without contrast. No intrahepatic biliary dilatation. Gallbladder is unremarkable. No common bile duct dilatation. Pancreas: No mass, inflammation or ductal dilatation. Spleen: Normal size.  No focal lesions. Adrenals/Urinary Tract: The adrenal glands are unremarkable and stable. No worrisome renal lesions or hydronephrosis. Small upper pole left renal cyst is stable. No obstructing ureteral calculi or bladder calculi. Stomach/Bowel: The stomach, duodenum, small bowel and colon are grossly. Stable advanced sigmoid colon diverticulosis without findings for acute diverticulitis. Vascular/Lymphatic: Stable advanced atherosclerotic calcifications involving the aorta and branch vessels. Stable 4.5 x 4.3 cm infrarenal abdominal aortic aneurysm. Recommend follow-up CT/MR every 6 months  and vascular consultation. This recommendation follows ACR consensus guidelines: White Paper of the ACR Incidental Findings Committee II on Vascular Findings. J Am Coll Radiol 2013; 10:789-794. Reproductive: The prostate gland and seminal vesicles are unremarkable. Other: No pelvic mass or adenopathy. No free pelvic fluid collections. No inguinal mass or adenopathy. No abdominal wall hernia or subcutaneous lesions. Musculoskeletal: Persistent or recurrent significant abnormality surrounding the right hip. There appears to be a persistent large abscess and possibly some component of hematoma. Overall stable appearing lucency surrounding the right acetabular prosthesis and upper right femoral prosthesis likely reflecting particle disease with some erosive changes involving the upper medial acetabulum. IMPRESSION: 1. Persistent or recurrent significant abnormality surrounding the right hip. There appears to be a persistent large abscess and possibly some component of hematoma. 2. No obstructing ureteral calculi or bladder calculi. 3. No significant acute intra-abdominal/intrapelvic process. 4. Stable 4.5 x 4.3 cm infrarenal abdominal aortic aneurysm. Aortic Atherosclerosis (ICD10-I70.0). Electronically Signed   By: Rudie Meyer M.D.   On: 11/05/2021 15:05   DG Hip Unilat W or Wo Pelvis 2-3 Views Right  Result Date: 10/13/2021 CLINICAL DATA:  hip pain EXAM: DG HIP (WITH OR WITHOUT PELVIS) 2-3V RIGHT COMPARISON:  CT pelvis 10/13/2021 FINDINGS: Total right hip arthroplasty. There is no evidence of hip fracture or dislocation. Frontal view of the left hip grossly unremarkable. No acute displaced fracture or diastasis of the bones of the pelvis. There is no evidence of severe arthropathy or other focal bone abnormality. Vascular calcifications. IMPRESSION: Negative for acute traumatic injury. Electronically Signed   By: Tish Frederickson M.D.   On: 10/13/2021 22:24   [place] EKG SITE RITE  Result Date: 10/19/2021 If Site  Rite image not attached, placement could not be confirmed due to current cardiac rhythm.   Labs:  CBC: Recent Labs    11/06/21 0950 11/06/21 2257 11/07/21 4098  11/08/21 0509 11/09/21 0505  WBC 5.0  --  4.7 4.2 4.4  HGB 7.6* 8.4* 8.3* 7.3* 7.9*  HCT 24.3* 26.8* 26.7* 23.2* 25.7*  PLT 216  --  189 165 163    COAGS: Recent Labs    10/11/21 1945 10/28/21 1251  INR 1.7* 1.8*  APTT 56*  --     BMP: Recent Labs    11/06/21 0950 11/07/21 0909 11/08/21 0509 11/09/21 0505  NA 138 138 139 142  K 4.7 4.1 3.7 3.9  CL 108 109 111 115*  CO2 20* 17* 20* 17*  GLUCOSE 106* 89 96 101*  BUN 66* 70* 71* 72*  CALCIUM 8.8* 8.5* 8.0* 8.0*  CREATININE 5.23* 6.11* 6.10* 6.56*  GFRNONAA 11* 9* 9* 8*    LIVER FUNCTION TESTS: Recent Labs    10/13/21 2128 10/21/21 1341 10/28/21 1251 11/05/21 1039  BILITOT 0.6 0.9 1.0 0.9  AST 48* 36 72* 67*  ALT 48* 27 55* 34  ALKPHOS 102 87 105 110  PROT 7.2 6.7 7.4 7.8  ALBUMIN 2.4* 2.1* 2.7* 2.4*    TUMOR MARKERS: No results for input(s): AFPTM, CEA, CA199, CHROMGRNA in the last 8760 hours.  Assessment and Plan: History of COPD, HTN, HLD, PE, seizure disorder, CVA and anxiety.  Patient had recent diagnosis of septic right hip status post I&D on 10/16/2021 followed with IV antibiotic therapy cefepime/daptomycin.  Patient presented to ED 11/05/2021 complaining of altered mental status and weakness.  Patient was admitted after CT renal stone showed persistent/recurrent significant abnormalities surrounding the right hip with persistent large abscess possibly a component of hematoma.  Patient was also found to be anemic with a hemoglobin of 8 falling to 6.7 the following day.  Patient is currently being followed by oncology for non-small cell lung cancer and has received palliative chemoradiation treatment.  Patient has history of anemia requiring blood count transfusions approximately every 10 days.  Patient was referred to IR by Dr. Smith Robert for outpatient  bone marrow biopsy.  Request made while patient is inpatient to perform biopsy.  Pt resting on bed. He is A&O and calm. He is in no distress.  Pt c/o continuing nausea.  He states he has been NPO for 2 days.   Risks and benefits of bone marrow biopsy and aspiration with moderate sedation was discussed with the patient and/or patient's family including, but not limited to bleeding, infection, damage to adjacent structures or low yield requiring additional tests.  All of the questions were answered and there is agreement to proceed.  Consent signed and in chart.   Thank you for this interesting consult.  I greatly enjoyed meeting Nachman Guttery and look forward to participating in their care.  A copy of this report was sent to the requesting provider on this date.  Electronically Signed: Shon Hough, NP 11/09/2021, 9:54 AM   I spent a total of 20 minutes in face to face in clinical consultation, greater than 50% of which was counseling/coordinating care for bone marrow biopsy and aspiration.

## 2021-11-09 NOTE — Assessment & Plan Note (Signed)
Declining to use his O2 and inhalers as of AM on 05/01, encouraged use of these  ?

## 2021-11-09 NOTE — TOC Initial Note (Signed)
Transition of Care (TOC) - Initial/Assessment Note  ? ? ?Patient Details  ?Name: Richard Wiggins ?MRN: [id number] ?Date of Birth: January 12, 1951 ? ?Transition of Care (TOC) CM/SW Contact:    ?Candie Chroman, LCSW ?Phone Number: ?11/09/2021, 12:22 PM ? ?Clinical Narrative:  Readmission prevention screen complete. CSW called into the room and wife answered. She said patient was sleeping. CSW introduced role and explained that discharge planning would be discussed. PCP is Dr. Daneen Schick at the [facility]. Wife drives him to appointments. He uses the [facility] on corner of [address]. No issues obtaining medications. He was active with [facility] prior to admission for PT, OT, RN. He has a RW and BSC at home. No further concerns. CSW encouraged patient's wife to contact CSW as needed. CSW will continue to follow patient and his wife for support and facilitate return home when stable. Wife will drive him home at discharge.               ? ?Expected Discharge Plan: [place] ?Barriers to Discharge: Continued Medical Work up ? ? ?Patient Goals and CMS Choice ?  ?  ?Choice offered to / list presented to : NA ? ?Expected Discharge Plan and Services ?Expected Discharge Plan: [place] ?  ?Discharge Planning Services: CM Consult ?Post Acute Care Choice: Resumption of Svcs/PTA Provider ?Living arrangements for the past 2 months: [place] ?                ?  ?DME Agency: NA ?  ?  ?  ?HH Arranged: RN, PT, OT ?[place] Agency: [place] ([place]) ?Date HH Agency Contacted: 11/09/21 ?  ?Representative spoke with at [place]: Floydene Flock ? ?Prior Living Arrangements/Services ?Living arrangements for the past 2 months: [place] ?Lives with:: Spouse ?Patient language and need for interpreter reviewed:: Yes ?Do you feel safe going back to the place where you live?: Yes      ?Need for Family Participation in Patient Care:  Yes (Comment) ?Care giver support system in place?: Yes (comment) ?Current home services: DME, Home OT, Home PT, Home RN ?Criminal Activity/Legal Involvement Pertinent to Current Situation/Hospitalization: No - Comment as needed ? ?Activities of Daily Living ?  ?  ? ?Permission Sought/Granted ?Permission sought to share information with : Facility Sport and exercise psychologist, Family Supports ?  ? Share Information with NAME: Zarin Knupp ? Permission granted to share info w AGENCY: [place] ? Permission granted to share info w Relationship: Spouse ? Permission granted to share info w Contact Information: [phone] ? ?Emotional Assessment ?Appearance:: Appears stated age ?Attitude/Demeanor/Rapport: Unable to Assess ?Affect (typically observed): Unable to Assess ?Orientation: : Oriented to Self, Oriented to Place, Oriented to  Time, Oriented to Situation ?Alcohol / Substance Use: Not Applicable ?Psych Involvement: No (comment) ? ?Admission diagnosis:  AKI (acute kidney injury) ([place]) [N17.9] ?Malignant neoplasm of lung, unspecified laterality, unspecified part of lung ([place]) [C34.90] ?Acute renal failure, unspecified acute renal failure type ([place]) [N17.9] ?Patient Active Problem List  ? Diagnosis Date Noted  ? Septic arthritis of hip ([place]) 11/05/2021  ? Dyslipidemia 11/05/2021  ? Paroxysmal atrial fibrillation ([place]) 11/05/2021  ? Acute kidney injury superimposed on chronic kidney disease ([place]) likely ATN/prerenal azotemia d/t poor po intake and nephrotoxic Rx  11/05/2021  ? Acute metabolic encephalopathy 93/23/5573  ? Acute lower UTI 11/05/2021  ? Infection of prosthetic total hip joint, initial encounter ([place])  10/16/2021  ? Dehydration   ? Epistaxis 09/23/2021  ? Normocytic anemia 09/23/2021  ? Lung cancer ([place]) 08/18/2021  ? Renal artery stenosis ([place]) 08/11/2021  ? CKD (chronic kidney disease), stage III ([place]) 03/11/2021  ? 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 Iron deficiency  anemia 02/04/2021  ? Long term (current) use of anticoagulants 11/12/2020  ? Positive colorectal cancer screening using Cologuard test 05/22/2020  ? Goals of care, counseling/discussion 05/06/2020  ? Malignant neoplasm of upper lobe of left lung ([place]) 05/06/2020  ? Preventative health care 03/26/2020  ? Atherosclerosis of native arteries of extremity with intermittent claudication ([place]) 03/14/2020  ? Trigger middle finger of right hand 02/20/2020  ? Abdominal aortic aneurysm (AAA) without rupture ([place]) 02/20/2020  ? 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  ? Benign essential HTN 02/10/2020  ? Pre-diabetes 02/10/2020  ? BPH (benign prostatic hyperplasia) 02/09/2020  ? Pancytopenia ([place]) 02/09/2020  ? Weakness of lower extremity 02/01/2020  ? COPD (chronic obstructive pulmonary disease) ([place]) 02/01/2020  ? Chronic systolic CHF (congestive heart failure) ([place]) 02/01/2020  ? Depression 02/01/2020  ? A-fib ([place]) 02/01/2020  ? CVA (cerebral vascular accident) ([place]) 02/01/2020  ? Coronary artery disease 02/01/2020  ? Carotid artery stenosis 02/01/2020  ? HLD (hyperlipidemia) 02/01/2020  ? Carotid stenosis, asymptomatic, left 02/14/2018  ? S/P coronary artery stent placement 09/20/2017  ? Centrilobular emphysema ([place]) 06/10/2017  ? Chronic back pain 06/10/2017  ? DOE (dyspnea on exertion) 06/10/2017  ? Elevated TSH 06/10/2017  ? Hematoma of groin 06/10/2017  ? PAD (peripheral artery disease) ([place]) 06/10/2017  ? Pulmonary embolism ([place]) 06/10/2017  ? Tobacco abuse 06/10/2017  ? Unintended weight loss 06/10/2017  ? AAA (abdominal aortic aneurysm) ([place]) 06/10/2017  ? PAF (paroxysmal atrial fibrillation) ([place]) 06/10/2017  ? Systolic CHF, chronic ([place]) 06/10/2017  ? Hypertension 05/31/2017  ? Other hyperlipidemia 05/31/2017  ? Hemiparesis affecting left side as late effect of cerebrovascular accident ([place]) 05/29/2017  ? Carotid artery stenosis, symptomatic, right 05/29/2017  ? Embolic stroke involving right middle cerebral  artery ([place]) 05/28/2017  ? ?PCP:  Cl[facility], [facility] ?Pharmacy:   ?[facility] [place], [address] ?[place] ?[place] [address] ?Phone: [phone] Fax: [phone] ? ?[facility] [phone] - [address], [address] ?[place] [address] ?Phone: [phone] Fax: [phone] ? ? ? ? ?Social Determinants of Health (SDOH) Interventions ?  ? ?Readmission Risk Interventions ? ?  11/09/2021  ? 12:19 PM  ?Readmission Risk Prevention Plan  ?Transportation Screening Complete  ?Medication Review Press photographer) Complete  ?PCP or Specialist appointment within 3-5 days of discharge Complete  ?[place] or Home Care Consult Complete  ?SW Recovery Care/Counseling Consult Complete  ?Palliative Care Screening Not Applicable  ?[place] Not Applicable  ? ? ? ?

## 2021-11-09 NOTE — Progress Notes (Signed)
Pt was seen with wife at bedside to be consulted/consented for bone marrow biopsy later today. Pt states he is not feeling well and does not want to do biopsy today. Pt reports nausea and tremors. He is refusing to wear his O2. He is in no distress.  ?Pt and wife made aware that they can reschedule biopsy when he is feeling better while admitted or as an outpatient procedure. Primary RN and IR team notified of pt decision. ? ? ?Narda Rutherford, AGNP-BC ?11/09/2021, 8:36 AM ? ? ? ?

## 2021-11-09 NOTE — Progress Notes (Signed)
Patient clincally stable post BMB per Dr Dwaine Gale, tolerated well. Received Versed 1 mg along with Fentanyl 50 mcg IV for procedure. Awake/alert and oriented post procedure. Update given to wife post procedure/at bedside. Report given to [facility] RN at bedside 157/ortho post procedure. Vitals stable pre and post procedure. ?

## 2021-11-09 NOTE — Procedures (Signed)
Interventional Radiology Procedure Note ? ?Procedure: CT guided bone marrow biopsy ? ?Indication: Anemia ? ?Findings: Please refer to procedural dictation for full description. ? ?Complications: None ? ?EBL: < 10 mL ? ?Miachel Roux, MD ?[phone] ? ? ?

## 2021-11-10 ENCOUNTER — Inpatient Hospital Stay
Admission: EM | Disposition: A | Discharge status: Home Health | Payer: Self-pay | Source: Home / Self Care | Attending: Internal Medicine

## 2021-11-10 ENCOUNTER — Inpatient Hospital Stay: Payer: No Typology Code available for payment source | Admitting: Infectious Diseases

## 2021-11-10 DIAGNOSIS — I480 Paroxysmal atrial fibrillation: Secondary | ICD-10-CM | POA: Diagnosis not present

## 2021-11-10 DIAGNOSIS — N186 End stage renal disease: Secondary | ICD-10-CM | POA: Diagnosis not present

## 2021-11-10 DIAGNOSIS — A419 Sepsis, unspecified organism: Secondary | ICD-10-CM

## 2021-11-10 DIAGNOSIS — I509 Heart failure, unspecified: Secondary | ICD-10-CM | POA: Diagnosis not present

## 2021-11-10 DIAGNOSIS — N189 Chronic kidney disease, unspecified: Secondary | ICD-10-CM | POA: Diagnosis not present

## 2021-11-10 DIAGNOSIS — Z7189 Other specified counseling: Secondary | ICD-10-CM

## 2021-11-10 DIAGNOSIS — E7800 Pure hypercholesterolemia, unspecified: Secondary | ICD-10-CM

## 2021-11-10 DIAGNOSIS — N179 Acute kidney failure, unspecified: Secondary | ICD-10-CM | POA: Diagnosis not present

## 2021-11-10 HISTORY — PX: DIALYSIS/PERMA CATHETER INSERTION: CATH118288

## 2021-11-10 LAB — CBC
HCT: 23.7 % — ABNORMAL LOW (ref 39.0–52.0)
Hemoglobin: 7.5 g/dL — ABNORMAL LOW (ref 13.0–17.0)
MCH: 28.1 pg (ref 26.0–34.0)
MCHC: 31.6 g/dL (ref 30.0–36.0)
MCV: 88.8 fL (ref 80.0–100.0)
Platelets: 148 10*3/uL — ABNORMAL LOW (ref 150–400)
RBC: 2.67 MIL/uL — ABNORMAL LOW (ref 4.22–5.81)
RDW: 19.9 % — ABNORMAL HIGH (ref 11.5–15.5)
WBC: 4.4 10*3/uL (ref 4.0–10.5)
nRBC: 0 % (ref 0.0–0.2)

## 2021-11-10 LAB — BASIC METABOLIC PANEL
Anion gap: 12 (ref 5–15)
BUN: 70 mg/dL — ABNORMAL HIGH (ref 8–23)
CO2: 18 mmol/L — ABNORMAL LOW (ref 22–32)
Calcium: 7.4 mg/dL — ABNORMAL LOW (ref 8.9–10.3)
Chloride: 111 mmol/L (ref 98–111)
Creatinine, Ser: 7 mg/dL — ABNORMAL HIGH (ref 0.61–1.24)
GFR, Estimated: 8 mL/min — ABNORMAL LOW (ref >60)
Glucose, Bld: 109 mg/dL — ABNORMAL HIGH (ref 70–99)
Potassium: 3.4 mmol/L — ABNORMAL LOW (ref 3.5–5.1)
Sodium: 141 mmol/L (ref 135–145)

## 2021-11-10 LAB — SURGICAL PATHOLOGY

## 2021-11-10 LAB — CK: Total CK: 849 U/L — ABNORMAL HIGH (ref 49–397)

## 2021-11-10 SURGERY — DIALYSIS/PERMA CATHETER INSERTION
Anesthesia: Moderate Sedation

## 2021-11-10 MED ORDER — FENTANYL CITRATE (PF) 100 MCG/2ML IJ SOLN
INTRAMUSCULAR | Status: DC | PRN
Start: 2021-11-10 — End: 2021-11-10
  Administered 2021-11-10: 25 ug via INTRAVENOUS

## 2021-11-10 MED ORDER — MIDAZOLAM HCL 2 MG/2ML IJ SOLN
INTRAMUSCULAR | Status: DC | PRN
Start: 2021-11-10 — End: 2021-11-10
  Administered 2021-11-10: 2 mg via INTRAVENOUS

## 2021-11-10 MED ORDER — ONDANSETRON HCL 4 MG/2ML IJ SOLN
4.0000 mg | Freq: Four times a day (QID) | INTRAMUSCULAR | Status: DC | PRN
  Administered 2021-11-14 – 2021-11-18 (×3): 4 mg via INTRAVENOUS
  Filled 2021-11-10 (×3): qty 2

## 2021-11-10 MED ORDER — CEFAZOLIN SODIUM-DEXTROSE 1-4 GM/50ML-% IV SOLN
1.0000 g | INTRAVENOUS | Status: AC
  Administered 2021-11-10: 1 g via INTRAVENOUS

## 2021-11-10 MED ORDER — SODIUM CHLORIDE 0.9 % IV SOLN: INTRAVENOUS | Status: DC

## 2021-11-10 MED ORDER — HEPARIN SODIUM (PORCINE) 1000 UNIT/ML IJ SOLN
INTRAMUSCULAR | Status: AC
  Filled 2021-11-10: qty 10

## 2021-11-10 MED ORDER — HYDROMORPHONE HCL 1 MG/ML IJ SOLN: 1.0000 mg | Freq: Once | INTRAMUSCULAR | Status: DC | PRN

## 2021-11-10 MED ORDER — FENTANYL CITRATE PF 50 MCG/ML IJ SOSY
PREFILLED_SYRINGE | INTRAMUSCULAR | Status: AC
  Filled 2021-11-10: qty 1

## 2021-11-10 MED ORDER — FAMOTIDINE 20 MG PO TABS: 40.0000 mg | ORAL_TABLET | Freq: Once | ORAL | Status: DC | PRN

## 2021-11-10 MED ORDER — DIAZEPAM 2 MG PO TABS
2.0000 mg | ORAL_TABLET | Freq: Three times a day (TID) | ORAL | Status: DC | PRN
  Administered 2021-11-10 – 2021-11-14 (×2): 2 mg via ORAL
  Filled 2021-11-10 (×2): qty 1

## 2021-11-10 MED ORDER — MIDAZOLAM HCL 2 MG/ML PO SYRP: 8.0000 mg | ORAL_SOLUTION | Freq: Once | ORAL | Status: DC | PRN

## 2021-11-10 MED ORDER — MIDAZOLAM HCL 5 MG/5ML IJ SOLN
INTRAMUSCULAR | Status: AC
  Filled 2021-11-10: qty 5

## 2021-11-10 MED ORDER — DIPHENHYDRAMINE HCL 50 MG/ML IJ SOLN: 50.0000 mg | Freq: Once | INTRAMUSCULAR | Status: DC | PRN

## 2021-11-10 MED ORDER — METHYLPREDNISOLONE SODIUM SUCC 125 MG IJ SOLR: 125.0000 mg | Freq: Once | INTRAMUSCULAR | Status: DC | PRN

## 2021-11-10 SURGICAL SUPPLY — 11 items
ADH SKN CLS APL DERMABOND .7 (GAUZE/BANDAGES/DRESSINGS) ×1
BIOPATCH RED 1 DISK 7.0 (GAUZE/BANDAGES/DRESSINGS) ×1 IMPLANT
CANNULA 5F STIFF (CANNULA) ×1 IMPLANT
CATH CANNON HEMO 15FR 23CM (HEMODIALYSIS SUPPLIES) ×2 IMPLANT
COVER PROBE U/S 5X48 (MISCELLANEOUS) ×1 IMPLANT
DERMABOND ADVANCED (GAUZE/BANDAGES/DRESSINGS) ×1
DERMABOND ADVANCED .7 DNX12 (GAUZE/BANDAGES/DRESSINGS) IMPLANT
DRAPE INCISE IOBAN 66X45 STRL (DRAPES) ×1 IMPLANT
PACK ANGIOGRAPHY (CUSTOM PROCEDURE TRAY) ×1 IMPLANT
SUT MNCRL AB 4-0 PS2 18 (SUTURE) ×1 IMPLANT
SUT SILK 0 FSL (SUTURE) ×1 IMPLANT

## 2021-11-10 NOTE — Consult Note (Signed)
@LOGO@ ? ? ?MRN : [id number] ? ?Richard Wiggins is a 70 y.o. (05/29/1951) male who presents with chief complaint of need dialysis catheter. ? ?History of Present Illness:  ? ?I am asked to evaluate the patient by Ms. Breeze. ? ? ?Patient is a 70-year-old gentleman with known chronic renal insufficiency who was admitted to [facility] on November 05, 2021 with worsening renal function.  He is status post I&D of a right septic on April 7 and subsequent antibiotic therapy.  Presenting symptoms on the day of admission were worsening confusion changes in mental status and anorexia.  Blood work demonstrated his creatinine had increased to 4.3.  At this point conservative treatment has not allowed his return to baseline and therefore I am asked to evaluate for dialysis access. ? ?Current Meds  ?Medication Sig  ? apixaban (ELIQUIS) 5 MG TABS tablet TAKE 1 TABLET(5 MG) BY MOUTH IN THE MORNING AND AT BEDTIME  ? aspirin 81 MG chewable tablet CHEW ONE TABLET BY MOUTH DAILY FOR HEART  ? atorvastatin (LIPITOR) 40 MG tablet TAKE 1 TABLET(40 MG) BY MOUTH AT BEDTIME (Patient taking differently: 20 mg.)  ? Budeson-Glycopyrrol-Formoterol (BREZTRI AEROSPHERE) 160-9-4.8 MCG/ACT AERO Inhale 2 puffs into the lungs in the morning and at bedtime.  ? carvedilol (COREG) 25 MG tablet Take 0.5 tablets (12.5 mg total) by mouth in the morning and at bedtime.  ? ceFEPime (MAXIPIME) IVPB Inject 2 g into the vein every 12 (twelve) hours. Indication:  septic total joint ?First Dose: Yes ?Last Day of Therapy:  5/18/223 ?Labs - Once weekly:  CBC/D, CMP, CPK, ESR ?Fax weekly labs to [phone] ?Method of administration: IV Push ?Method of administration may be changed at the discretion of home infusion pharmacist based upon assessment of the patient and/or caregiver's ability to self-administer the medication ordered.  ? daptomycin (CUBICIN) IVPB Inject 650 mg into the vein daily. Indication:  septic total joint ?First Dose:  Yes ?Last Day of Therapy:  11/26/21 ?Labs - Once weekly:  CBC/D, CMP, CPK, ESR ?Fax weekly labs to [phone] ?Method of administration: IV Push ?Method of administration may be changed at the discretion of home infusion pharmacist based upon assessment of the patient and/or caregiver's ability to self-administer the medication ordered.  ? HYDROmorphone (DILAUDID) 2 MG tablet Take 0.5-1 tablets (1-2 mg total) by mouth every 4 (four) hours as needed for moderate pain (pain score 4-6).  ? lamoTRIgine (LAMICTAL) 100 MG tablet Take 100 mg by mouth 2 (two) times daily.  ? LORazepam (ATIVAN) 0.5 MG tablet Take 1 tablet (0.5 mg total) by mouth every 6 (six) hours as needed (Nausea or vomiting).  ? ondansetron (ZOFRAN) 8 MG tablet Take 1 tablet (8 mg total) by mouth 2 (two) times daily as needed for refractory nausea / vomiting. Start on day 3 after chemo.  ? sacubitril-valsartan (ENTRESTO) 49-51 MG Take 0.5 tablets by mouth in the morning and at bedtime.  ? tamsulosin (FLOMAX) 0.4 MG CAPS capsule Take 1 capsule (0.4 mg total) by mouth daily after supper.  ? ? ?Past Medical History:  ?Diagnosis Date  ? AAA (abdominal aortic aneurysm) (HCC) 2020  ? a. 2020 U/S: 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 -plan medical managmment with tight bp control; b. 09/2020 Abd U/S: 4.1cm.  ? Anxiety   ? Aortic atherosclerosis (HCC)   ? Benign essential HTN 02/10/2020  ? BPH (benign prostatic hyperplasia) 02/09/2020  ? Brain aneurysm   ? 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 a.) ENB/EBUS performed; Bx non-diagnostic. b.) presumed stage I NSCLC in the LUL; underwent SBRT (60 Gy over 5 fractions)  ? Carotid arterial disease (HCC)   ? a. 05/2017 s/p R carotid endarterectomy following CVA.  ? Chronic HFimpEF (heart failure with improved ejection fraction) (HCC)   ? a. 2018 Echo: EF 30-35%; b. 02/2021 Echo: EF 55-60%, nl RV fxn. Triv MR. Asc Ao 38mm.  ? CKD (chronic kidney disease), stage III (HCC)   ? Claudication (HCC)   ? a. 09/2020 ABIs: R  0.91. L 0.99.  ? COPD (chronic obstructive pulmonary disease) (HCC)   ? Coronary artery disease   ? a. 06/2017 PCI ([place]): s/p PCI to the RCA. LAD 50, LCX 20.  ? Depression   ? 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 a. 02/2021 Asc Ao 38mm.  ? Emphysema of lung (HCC)   ? History of blood transfusion   ? HLD (hyperlipidemia)   ? IDA (iron deficiency anemia) 02/09/2020  ? 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  ? Inguinal hernia   ? right  ? Ischemic cardiomyopathy   ? Long term current use of anticoagulant   ? a.) apixaban  ? Marijuana use, continuous 02/10/2020  ? Nosebleed   ? Osteoporosis   ? PAF (paroxysmal atrial fibrillation) (HCC)   ? a. 05/2017 Dx in setting of CVA. CHA2DS2VASc = 6-->Eliquis.  ? Pre-diabetes   ? Pulmonary emboli (HCC) 06/07/2017  ? Right lower lobe pulmonary embolism small segmental, multifocal multifocal pneumonia, mediastinal lymphadenopathy, moderate centrilobular emphysema  ? Seizures (HCC)   ? Sigmoid diverticulosis   ? a.) CT 01/27/2021: severe  ? Stroke (HCC)   ? a. 05/2017 - hospitalized in [place]->prolonged hospitalization in setting of R CEA, PE, and finding of RCA dzs on cath; b. Residual left sided weakness to arm and leg.  ? ? ?Past Surgical History:  ?Procedure Laterality Date  ? brain aneurysm with clip    ? COLONOSCOPY WITH PROPOFOL N/A 07/23/2020  ? Procedure: COLONOSCOPY WITH PROPOFOL;  Surgeon: Vanga, Rohini Reddy, MD;  Location: [facility] ENDOSCOPY;  Service: Gastroenterology;  Laterality: N/A;  ? CORONARY ANGIOPLASTY WITH STENT PLACEMENT  07/08/2017  ? INCISION AND DRAINAGE HIP Right 10/16/2021  ? Procedure: IRRIGATION AND DEBRIDEMENT OF INFECTED TOTAL HIP;  Surgeon: Menz, Michael, MD;  Location: [facility] ORS;  Service: Orthopedics;  Laterality: Right;  ? PORTA CATH INSERTION N/A 08/31/2021  ? Procedure: PORTA CATH INSERTION;  Surgeon: Dew, Jason S, MD;  Location: [facility] INVASIVE CV LAB;  Service: Cardiovascular;  Laterality: N/A;  ? TOTAL HIP ARTHROPLASTY Right   ? VIDEO  BRONCHOSCOPY WITH ENDOBRONCHIAL NAVIGATION N/A 04/23/2020  ? Procedure: VIDEO BRONCHOSCOPY WITH ENDOBRONCHIAL NAVIGATION;  Surgeon: Gonzalez, Carmen L, MD;  Location: [facility] ORS;  Service: Pulmonary;  Laterality: N/A;  ? VIDEO BRONCHOSCOPY WITH ENDOBRONCHIAL ULTRASOUND Left 08/12/2021  ? Procedure: VIDEO BRONCHOSCOPY WITH ENDOBRONCHIAL ULTRASOUND;  Surgeon: Gonzalez, Carmen L, MD;  Location: [facility] ORS;  Service: Cardiopulmonary;  Laterality: Left;  ? ? ?Social History ?Social History  ? ?Tobacco Use  ? Smoking status: Former  ?  Packs/day: 2.00  ?  Years: 53.00  ?  Pack years: 106.00  ?  Types: Cigarettes  ?  Quit date: 05/28/2017  ?  Years since quitting: 4.4  ? Smokeless tobacco: Never  ? Tobacco comments:  ?  Quit in 2018  ?Vaping Use  ? Vaping Use: Never used  ?Substance Use Topics  ? Alcohol use: Not Currently  ?   Drug use: Not Currently  ?  Types: Marijuana  ?  Comment: last smoke x1 month ago  ? ? ?Family History ?Family History  ?Problem Relation Age of Onset  ? Diabetes Mother   ? Heart disease Mother   ? Stroke Father   ? Heart disease Father   ? Heart attack Father   ? Alcohol abuse Father   ? Heart disease Sister   ? Heart attack Sister   ? Heart disease Maternal Grandmother   ? Heart attack Maternal Grandmother   ? Heart attack Paternal Grandmother   ? Diabetes Brother   ? Heart disease Brother   ? Heart attack Brother   ? ? ?Allergies  ?Allergen Reactions  ? Tape Rash  ?  plastic  ? Wound Dressing Adhesive Itching and Rash  ?  Gets stuck to skin, makes wound spread  ? ? ? ?REVIEW OF SYSTEMS (Negative unless checked) ? ?Constitutional: []Weight loss  []Fever  []Chills ?Cardiac: []Chest pain   []Chest pressure   []Palpitations   []Shortness of breath when laying flat   []Shortness of breath with exertion. ?Vascular:  []Pain in legs with walking   []Pain in legs at rest  []History of DVT   []Phlebitis   []Swelling in legs   []Varicose veins   []Non-healing ulcers ?Pulmonary:   []Uses home oxygen   []Productive  cough   []Hemoptysis   []Wheeze  []COPD   []Asthma ?Neurologic:  []Dizziness   []Seizures   []History of stroke   []History of TIA  []Aphasia   []Vissual changes   []Weakness or numbness in arm   []Weakness or

## 2021-11-10 NOTE — Progress Notes (Signed)
Hemodialysis Post Treatment Note: ? Tx date: 11/10/2021 ?Tx time: 2 hours ?Access: left Dialysis Catheter  ?UF Removed: 1 ml ?  ?Note: Patient completed first dialysis treatment as ordered with no difficulty. Patient slightly anxious intermittently, experience asymptomatic hypertension. Patient with no s/s of acute distress. Patient s/p dialysis catheter placement today. Area above catheter OTA, bloody drainage noted. Slight protrusion noted to area, patient verbalized it?s been like that for weeks. Unsure of the accurately of that statement due to patient lapse of confusion. Area wipe clean, covered with gauze and secure with tape. Report given to [place] A. Quillian Quince, RN. ? ?

## 2021-11-10 NOTE — Progress Notes (Signed)
?Progress Note ? ? ?Patient: Richard Wiggins:[id number] DOB: 05-03-1951 DOA: 11/05/2021     5 ?DOS: the patient was seen and examined on 11/10/2021 ?  ?Hospital course: ?Richard Wiggins is a 71 year old Caucasian male, PMH COPD, HTN, HLD, PE, seizure disorder, CVA, anxiety, recent diagnosis of septic right hip status post I&D on 10/16/2021 followed with IV antibiotic therapy cefepime/daptomycin.  He presented to ED 11/05/2021 with chief complaint altered mental status and weakness.   ?In ED, vital signs stable/hypertensive, creatinine increased from 1.82 on 10/28/2021 up to 4.63, CBC showed anemia close to baseline, urinalysis concerning for UTI, no concerns on EKG, noncontrast CT head no acute findings, renal stone CT showed persistent/recurrent significant abnormalities surrounding the right hip, persistent large abscess possibly some component of hematoma, also showing no ureteral calculi/bladder calculi/other acute intra-abdominal or intrapelvic process.  Patient treated with IV hydration, antibiotics.  ?11/06/21:  ?Anemia: underwent transfusion 1 unit PRBC d/t Hgb 8 on admission 11/05/21, AM 11/06/21 Hgb 6.7 possible dilutional however may be contributing to hypoperfusion related AKI, pt w/ chronic anemia and multiple PRBC transfusions. HemOnc planning for bone marrow biopsy and agree to follow patient while he is here. I ?Infection: ID saw patient, abx were adjusted for renal function and mental status (cefepime switched to ceftriaxone to avoid encephalopathy, daptomycin ordered q 48 hours), opinion that joint fluid collection more likely seroma as opposed to abscess.  ?11/07/21:  ?Renal: creatinine continues to increase, nephrology consulted ?11/08/21:  ?Renal: creatinine plateau (peak yesterday 6.11, today 6.10), nephrology following, urine output has improved, plan is to continue IV fluids with isotonic saline 125 cc an hour, monitor UOP ?Anemia: Hgb peak 8.3 yesterday, today 7.3, HemOnc following and plan is for  bone marrow biopsy tomorrow ?Infection: UCx <10K, Blood Cx x2 NGTD ?11/09/21:  ?HemOnc: Bone marrow bx today.  ?Renal: Cr worse to 6.5, nephrology plan for permacath and dialysis ?11/10/21:  ?Renal: Cr to 7.0, needs dialysis ?Vascular: planning for insertion tunneled catheter today ?GOC: discussed code status, wife reports he had a DNR in [place], has advanced directives from [place] scanned to [facility], updated code status in [place]. Confirmed DNR w/ patient and wife who is at bedside  ?___ ? ? ? ?Consults:  ?Orthopedic Surgery  ?Infectious disease ?HemOnc ?Nephrology ? ?Procedures:  ?None at this time ? ? ? ?Assessment and Plan: ? ? ?Principal Problem: ?  Acute kidney injury superimposed on chronic kidney disease ([place]) likely ATN/prerenal azotemia d/t poor po intake and nephrotoxic Rx  ?Active Problems: ?  Septic arthritis of hip ([place]) ?  Acute metabolic encephalopathy ?  Acute lower UTI ?  COPD (chronic obstructive pulmonary disease) ([place]) ?  Chronic systolic CHF (congestive heart failure) ([place]) ?  Normocytic anemia ?  Dyslipidemia ?  Paroxysmal atrial fibrillation ([facility]) ? ? ?* Acute kidney injury superimposed on chronic kidney disease ([place]) likely ATN/prerenal azotemia d/t poor po intake and nephrotoxic Rx  ?Creatinine  is increased from 4.6 on 11/05/21 to 5.1 on 11/06/21, to 6.11 on 11/07/21 --> nephrology consulted ?Creatinine about the same on 11/08/2021, 6.10 --> worse on 05/01 to 6.5 --> 7.0 on 11/10/21  ?Planning permacath placement and hemodialysis  ?Avoid nephrotoxins (holding home entresto, holding diuresis unless absolutely needed, holding home eliquis) ?Monitor BMP per nephro  ?IV fluids w/ caution given CHF history but so far no s/s fluid overload  ? ?Septic arthritis of hip ([place]) ?now associated with right hip fluid collection possibly seroma less likely abscess ?Per infectious disease recs: IV cefepime  and daptomycin --> cefepime switched to ceftriaxone to avoid encephalopathy, daptomycin ordered q 48  hours  ?orthopedic consultation - some fluid accumulation is expected, no surgery planned for now given his other risk factors but may consider IR intervention to aspirate the fluid/abscess  ? ?Acute metabolic encephalopathy ?This is likely secondary to his AKI as well as septic right hip arthritis. ?Management as above. ?if worsening may consider neurologic w/u, at this point metabolic causes are more likely ? ?Acute lower UTI ?Likely contributing to metabolic encephalopathy and AKI ?IV cefepime --> cefrtiaxone per ID ?follow urine culture. ? ? ?Counseling regarding advance care planning and goals of care ?discussed code status and EOL wishes at length, I reassured them that do not think he is in danger of immediate decompensation but he is quite ill and this was stressed to him and his wife in terms of the unlikelihood of benefit of resuscitative measures and the potential that these treatments may cause pain/harm if he suffers a cardiac/respiratory arrest. His wife reports he had a DNR in [place], has advanced directives from [place] scanned to [facility], updated code status in [place]. Confirmed DNR w/ patient and wife who is at bedside. Patient has been intermittently confused (likely uremic) but in my determination he has capacity to provide informed consent/refusal regarding resuscitative treatments.  ? ?Paroxysmal atrial fibrillation ([place]) ?Held anticoagulation for now given anemia / bleed risk, may restart w/ heparin vs home eliquis after cath placement / if Hgb stays up ? ?Dyslipidemia ?continue statin therapy at reduced dose to avoid hepatotoxicity w/ daptomycin ? ?Normocytic anemia ?Recent surgery, some anemia expected  ?Hgb improved post PRBC ?BMB 11/09/21, pathology pending, HemOnc following  ? ? ?Chronic systolic CHF (congestive heart failure) ([place]) ?Not in exacerbation as of admission, however needs fluids to treat AKI (Cr is increased from 4.6 yesterday 11/05/21 to 5.1 11/06/21,  6.1 on 11/07/21) ?Last  Echo on file 02/2021 EF 55-60% ?Holding entresto and lasix for now given AKI ?Monitor I&O ? ?COPD (chronic obstructive pulmonary disease) ([place]) ?Declining to use his O2 and inhalers as of AM on 05/01, encouraged use of these  ? ? ? ? ?  ? ?Subjective: Patient seen today, sitting on bedside, wife is with him, he is less pale and more calm than yesterday,  he is talkative.  ? ?Physical Exam: ?Vitals:  ? 11/09/21 2344 11/10/21 0350 11/10/21 0830 11/10/21 1307  ?BP: (!) 154/66 (!) 167/86 (!) 164/72 (!) 162/66  ?Pulse: 66 63 66 65  ?Resp: '16 16 18 18  ' ?Temp: 97.9 ?F (36.6 ?C) 97.8 ?F (36.6 ?C) 97.7 ?F (36.5 ?C) 98.1 ?F (36.7 ?C)  ?TempSrc:   Oral Oral  ?SpO2: 95% 100% 94% 95%  ?Weight:      ?Height:      ? ?Constitutional:  ?VSS, see nurse notes ?General Appearance: alert, NAD ?Ears, Nose, Mouth, Throat: ?Normal appearance ?Neck: ?No masses, trachea midline ?Respiratory: ?Normal respiratory effort ?Breath sounds normal, no wheeze/rhonchi/rales ?Cardiovascular: ?S1/S2 normal, no murmur/rub/gallop auscultated ?No lower extremity edema ?Gastrointestinal: ?Nontender, no masses ?Musculoskeletal:  ?No clubbing/cyanosis of digits ?Neurological: ?No cranial nerve deficit on limited exam ?Motor and sensation intact and symmetric ?Psychiatric: ?Normal judgment/insight ?Normal mood and affect ? ? ? ?Data Reviewed: ?CT BONE MARROW BIOPSY & ASPIRATION ? ?Result Date: 11/09/2021 ?INDICATION: Anemia EXAM: CT GUIDED BONE MARROW ASPIRATES AND BIOPSY MEDICATIONS: None. ANESTHESIA/SEDATION: Fentanyl 50 mcg IV; Versed 1 mg IV Moderate Sedation Time:  10 minutes The patient was continuously monitored during the procedure by the  interventional radiology nurse under my direct supervision. COMPLICATIONS: None immediate. PROCEDURE: The procedure was explained to the patient. The risks and benefits of the procedure were discussed and the patient's questions were addressed. Informed consent was obtained from the patient. The patient was placed prone  on CT table. Images of the pelvis were obtained. The right side of back was prepped and draped in sterile fashion. The skin and right posterior ilium were anesthetized with 1% lidocaine. 11 gauge bon

## 2021-11-10 NOTE — Consult Note (Signed)
Pharmacy Antibiotic Note ? ?Richard Wiggins is a 71 y.o. male admitted on 11/05/2021 with AKI. Patient with PMH of septic total joint currently receiving IV cefepime and daptomycin outpatient. ?outpatient reigmen was: end date 11/26/21 ?Cefepime 2 gram Q12H ?Daptomycin 650 mg Q24H ?Per patient: last doses of medication 11/05/21 AM ? ? Pharmacy has been consulted for Daptomycin and cefepime dosing. ? ?Plan: ?Cefepime changed to Ceftriaxone 2 gm IV q24h ? ?continue Daptomycin 650 mg IV Q48H for Crcl 10 ml/min ? ?Pt to get temporary HD cath today 5/2 and likely HD- f/u ? ? ?Height: 5\' 10"  (177.8 cm) ?Weight: 78.9 kg (174 lb) ?IBW/kg (Calculated) : 73 ? ?Temp (24hrs), Avg:98 ?F (36.7 ?C), Min:97.7 ?F (36.5 ?C), Max:98.4 ?F (36.9 ?C) ? ?Recent Labs  ?Lab 11/06/21 ?0950 11/07/21 ?0909 11/08/21 ?1884 11/09/21 ?0505 11/10/21 ?0310  ?WBC 5.0 4.7 4.2 4.4 4.4  ?CREATININE 5.23* 6.11* 6.10* 6.56* 7.00*  ? ?  ?Estimated Creatinine Clearance: 10 mL/min (A) (by C-G formula based on SCr of 7 mg/dL (H)).   ? ?Allergies  ?Allergen Reactions  ? Tape Rash  ?  plastic  ? Wound Dressing Adhesive Itching and Rash  ?  Gets stuck to skin, makes wound spread  ? ? ?Antimicrobials this admission: ?4/27 Daptomycin >>  ?4/27 Cefepime >> 4/28 ?4/28 Ceftriaxone>> ? ?Dose adjustments this admission: ? ? ?Microbiology results: ?4/28: Bcx: NGx4d ?4/27 UCx: insignif growth ? ? ?Thank you for allowing pharmacy to be a part of this patient?s care. ? ?Noralee Space, PharmD ?Clinical Pharmacist   ?11/10/2021 12:15 PM ? ?

## 2021-11-10 NOTE — Progress Notes (Signed)
?[facility]  ?PROGRESS NOTE  ? ?Subjective:  ? ?Patient seen laying in bed, wife at bedside ?States he feels slightly better today ?Currently NPO for procedure ? ?Creatinine 7.00 ?Urine output 990 mL in preceding 24 hours ?CO2 18 ?BUN 70 ? ?Objective:  ?Vital signs: ?Blood pressure (!) 164/72, pulse 66, temperature 97.7 ?F (36.5 ?C), temperature source Oral, resp. rate 18, height 5\' 10"  (1.778 m), weight 78.9 kg, SpO2 94 %. ? ?Intake/Output Summary (Last 24 hours) at 11/10/2021 1221 ?Last data filed at 11/10/2021 1159 ?Gross per 24 hour  ?Intake 300 ml  ?Output 1100 ml  ?Net -800 ml  ? ? ?Filed Weights  ? 11/05/21 1218  ?Weight: 78.9 kg  ? ? ? ?Physical Exam: ?General:  No acute distress  ?Head:  Normocephalic, atraumatic. Dry oral mucosal membranes  ?Eyes:  Anicteric  ?Lungs:  Clear to auscultation, normal effort  ?Heart:  S1S2 no rubs  ?Abdomen:   Soft, nontender, bowel sounds present  ?Extremities: No peripheral edema.  ?Neurologic:  Awake, alert, following commands  ?Skin:  No lesions  ?Access: None  ? ? ?Basic Metabolic Panel: ?Recent Labs  ?Lab 11/06/21 ?0950 11/07/21 ?0909 11/08/21 ?11/10/21 11/09/21 ?0505 11/10/21 ?0310  ?NA 138 138 139 142 141  ?K 4.7 4.1 3.7 3.9 3.4*  ?CL 108 109 111 115* 111  ?CO2 20* 17* 20* 17* 18*  ?GLUCOSE 106* 89 96 101* 109*  ?BUN 66* 70* 71* 72* 70*  ?CREATININE 5.23* 6.11* 6.10* 6.56* 7.00*  ?CALCIUM 8.8* 8.5* 8.0* 8.0* 7.4*  ? ? ? ?CBC: ?Recent Labs  ?Lab 11/05/21 ?1039 11/06/21 ?0505 11/06/21 ?11/08/21 11/06/21 ?2257 11/07/21 ?11/09/21 11/08/21 ?11/10/21 11/09/21 ?0505 11/10/21 ?0310  ?WBC 5.7   < > 5.0  --  4.7 4.2 4.4 4.4  ?NEUTROABS 4.4  --   --   --   --   --  3.2  --   ?HGB 8.0*   < > 7.6* 8.4* 8.3* 7.3* 7.9* 7.5*  ?HCT 26.1*   < > 24.3* 26.8* 26.7* 23.2* 25.7* 23.7*  ?MCV 89.4   < > 88.0  --  88.1 87.5 88.3 88.8  ?PLT 236   < > 216  --  189 165 163 148*  ? < > = values in this interval not displayed.  ? ? ? ? ?Urinalysis: ?No results for input(s): COLORURINE, LABSPEC, PHURINE,  GLUCOSEU, HGBUR, BILIRUBINUR, KETONESUR, PROTEINUR, UROBILINOGEN, NITRITE, LEUKOCYTESUR in the last 72 hours. ? ?Invalid input(s): APPERANCEUR ?  ? ? ?Imaging: ?CT BONE MARROW BIOPSY & ASPIRATION ? ?Result Date: 11/09/2021 ?INDICATION: Anemia EXAM: CT GUIDED BONE MARROW ASPIRATES AND BIOPSY MEDICATIONS: None. ANESTHESIA/SEDATION: Fentanyl 50 mcg IV; Versed 1 mg IV Moderate Sedation Time:  10 minutes The patient was continuously monitored during the procedure by the interventional radiology nurse under my direct supervision. COMPLICATIONS: None immediate. PROCEDURE: The procedure was explained to the patient. The risks and benefits of the procedure were discussed and the patient's questions were addressed. Informed consent was obtained from the patient. The patient was placed prone on CT table. Images of the pelvis were obtained. The right side of back was prepped and draped in sterile fashion. The skin and right posterior ilium were anesthetized with 1% lidocaine. 11 gauge bone needle was directed into the right ilium with CT guidance. Two aspirates and 1 core biopsy were obtained. Bandage placed over the puncture site. IMPRESSION: CT guided bone marrow aspiration and core biopsy. Electronically Signed   By: 01/09/2022.D.  On: 11/09/2021 13:12   ? ? ?Medications:  ? ? cefTRIAXone (ROCEPHIN)  IV 2 g (11/10/21 1107)  ? DAPTOmycin (CUBICIN)  IV 650 mg (11/08/21 1122)  ? sodium bicarbonate 150 mEq in D5W infusion 50 mL/hr at 11/09/21 1219  ? ? budesonide (PULMICORT) nebulizer solution  0.25 mg Nebulization BID  ? carvedilol  12.5 mg Oral BID  ? Chlorhexidine Gluconate Cloth  6 each Topical Daily  ? lidocaine  2 patch Transdermal Q24H  ? multivitamin with minerals  1 tablet Oral Daily  ? oxyCODONE  5 mg Oral Q12H  ? polyethylene glycol  17 g Oral Daily  ? scopolamine  1 patch Transdermal Q72H  ? tamsulosin  0.4 mg Oral Daily  ? umeclidinium-vilanterol  1 puff Inhalation Daily  ? ? ?Assessment/ Plan:  ?   ?Principal  Problem: ?  Acute kidney injury superimposed on chronic kidney disease ([place]) likely ATN/prerenal azotemia d/t poor po intake and nephrotoxic Rx  ?Active Problems: ?  COPD (chronic obstructive pulmonary disease) ([place]) ?  Chronic systolic CHF (congestive heart failure) ([place]) ?  Normocytic anemia ?  Septic arthritis of hip ([place]) ?  Dyslipidemia ?  Paroxysmal atrial fibrillation ([facility]) ?  Acute metabolic encephalopathy ?  Acute lower UTI ? ?71 y.o. male with a PMHx of hypertension, coronary artery disease, congestive heart failure, COPD, history of lung cancer now with recurrence on radiation and chemotherapy, atrial fibrillation, s/p clipping of the brain aneurysm, chronic kidney disease with anemia now found to have worsening renal indicis and hence a renal consultation.  Patient recently had right hip arthroplasty followed by right hip joint effusion and is being treated for sepsis.  He was initially on daptomycin and cefepime.  Presently on daptomycin every other day along with cefepime. ?Patient developed acute kidney injury on the top of chronic kidney disease.  Initially the creatinine was 1.8 subsequently went up to 6.11 and plateaued today at the same level.   ?He had a CT scan with renal stone survey which did not reveal any evidence of hydronephrosis. ?He was started on IV fluids presently with isotonic saline at 125 cc an hour. ? ?#1: Acute kidney injury: Patient with acute kidney injury on the top of chronic kidney disease most likely secondary to severe prerenal azotemia due to decreased p.o. intake on the top of being on diuretics and Entresto.  There may be a component of ATN.  ? Renal function continues to decline. UOP adequate. We will proceed with permcath placement and initiation of dialysis today, based on timing of placement. Will continue to monitor renal function.  ?  ?#2: Sepsis: ID note appreciated. Ceftriaxone and Daptomycin prescribed by primary team. ?  ?#3: Anemia: Continue to monitor  closely and transfuse as needed per hematology.  Hgb 7.5 today. ?  ?#4: Hypertension: Continue carvedilol at the present doses.  May add amlodipine as needed. ? BP 164/72 today ? ?#5 Acute metabolic acidosis. Bicarb slowly improving. Continue Sodium bicarbonate at 57m/hr.  ? ? LOS: 5 ?[place]?[facility] ?5/2/202312:21 PM ?  ?

## 2021-11-10 NOTE — Progress Notes (Signed)
Hemodialysis Post Treatment Note: ? Tx date: 11/10/2021 ?Tx time: 2 hours ?Access: left Dialysis Catheter  ?UF Removed: 1 ml ?  ?Note: Patient completed first dialysis treatment as ordered with no difficulty. Patient slightly anxious intermittently, experience asymptomatic hypertension. Patient with no s/s of acute distress. Patient s/p dialysis catheter placement today. Area above catheter OTA, bloody drainage noted. Slight protrusion noted to area, patient verbalized it's been like that for weeks. Unsure of the accurately of that statement due to patient lapse of confusion. Area wipe clean, covered with gauze and secure with tape. Report given to [place] A. Quillian Quince, RN. ?

## 2021-11-10 NOTE — H&P (View-Only) (Signed)
@LOGO@ ? ? ?MRN : [id number] ? ?Richard Wiggins is a 70 y.o. (01/14/1951) male who presents with chief complaint of need dialysis catheter. ? ?History of Present Illness:  ? ?I am asked to evaluate the patient by Ms. Breeze. ? ? ?Patient is a 70-year-old gentleman with known chronic renal insufficiency who was admitted to [facility] on November 05, 2021 with worsening renal function.  He is status post I&D of a right septic on April 7 and subsequent antibiotic therapy.  Presenting symptoms on the day of admission were worsening confusion changes in mental status and anorexia.  Blood work demonstrated his creatinine had increased to 4.3.  At this point conservative treatment has not allowed his return to baseline and therefore I am asked to evaluate for dialysis access. ? ?Current Meds  ?Medication Sig  ? apixaban (ELIQUIS) 5 MG TABS tablet TAKE 1 TABLET(5 MG) BY MOUTH IN THE MORNING AND AT BEDTIME  ? aspirin 81 MG chewable tablet CHEW ONE TABLET BY MOUTH DAILY FOR HEART  ? atorvastatin (LIPITOR) 40 MG tablet TAKE 1 TABLET(40 MG) BY MOUTH AT BEDTIME (Patient taking differently: 20 mg.)  ? Budeson-Glycopyrrol-Formoterol (BREZTRI AEROSPHERE) 160-9-4.8 MCG/ACT AERO Inhale 2 puffs into the lungs in the morning and at bedtime.  ? carvedilol (COREG) 25 MG tablet Take 0.5 tablets (12.5 mg total) by mouth in the morning and at bedtime.  ? ceFEPime (MAXIPIME) IVPB Inject 2 g into the vein every 12 (twelve) hours. Indication:  septic total joint ?First Dose: Yes ?Last Day of Therapy:  5/18/223 ?Labs - Once weekly:  CBC/D, CMP, CPK, ESR ?Fax weekly labs to [phone] ?Method of administration: IV Push ?Method of administration may be changed at the discretion of home infusion pharmacist based upon assessment of the patient and/or caregiver's ability to self-administer the medication ordered.  ? daptomycin (CUBICIN) IVPB Inject 650 mg into the vein daily. Indication:  septic total joint ?First Dose:  Yes ?Last Day of Therapy:  11/26/21 ?Labs - Once weekly:  CBC/D, CMP, CPK, ESR ?Fax weekly labs to [phone] ?Method of administration: IV Push ?Method of administration may be changed at the discretion of home infusion pharmacist based upon assessment of the patient and/or caregiver's ability to self-administer the medication ordered.  ? HYDROmorphone (DILAUDID) 2 MG tablet Take 0.5-1 tablets (1-2 mg total) by mouth every 4 (four) hours as needed for moderate pain (pain score 4-6).  ? lamoTRIgine (LAMICTAL) 100 MG tablet Take 100 mg by mouth 2 (two) times daily.  ? LORazepam (ATIVAN) 0.5 MG tablet Take 1 tablet (0.5 mg total) by mouth every 6 (six) hours as needed (Nausea or vomiting).  ? ondansetron (ZOFRAN) 8 MG tablet Take 1 tablet (8 mg total) by mouth 2 (two) times daily as needed for refractory nausea / vomiting. Start on day 3 after chemo.  ? sacubitril-valsartan (ENTRESTO) 49-51 MG Take 0.5 tablets by mouth in the morning and at bedtime.  ? tamsulosin (FLOMAX) 0.4 MG CAPS capsule Take 1 capsule (0.4 mg total) by mouth daily after supper.  ? ? ?Past Medical History:  ?Diagnosis Date  ? AAA (abdominal aortic aneurysm) (HCC) 2020  ? a. 2020 U/S: 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 -plan medical managmment with tight bp control; b. 09/2020 Abd U/S: 4.1cm.  ? Anxiety   ? Aortic atherosclerosis (HCC)   ? Benign essential HTN 02/10/2020  ? BPH (benign prostatic hyperplasia) 02/09/2020  ? Brain aneurysm   ? 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 a.) ENB/EBUS performed; Bx non-diagnostic. b.) presumed stage I NSCLC in the LUL; underwent SBRT (60 Gy over 5 fractions)  ? Carotid arterial disease ([place])   ? a. 05/2017 s/p R carotid endarterectomy following CVA.  ? 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 a. 2018 Echo: EF 30-35%; b. 02/2021 Echo: EF 55-60%, nl RV fxn. Triv MR. Asc Ao 14m.  ? CKD (chronic kidney disease), stage III ([place]   ? Claudication ([facility]   ? a. 09/2020 ABIs: R  0.91. L 0.99.  ? COPD (chronic obstructive pulmonary disease) ([place]   ? Coronary artery disease   ? a. 06/2017 PCI ([place]): s/p PCI to the RCA. LAD 50, LCX 20.  ? Depression   ? 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 a. 02/2021 Asc Ao 330m  ? Emphysema of lung ([place]  ? History of blood transfusion   ? HLD (hyperlipidemia)   ? IDA (iron deficiency anemia) 02/09/2020  ? 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  ? Inguinal hernia   ? right  ? Ischemic cardiomyopathy   ? Long term current use of anticoagulant   ? a.) apixaban  ? Marijuana use, continuous 02/10/2020  ? Nosebleed   ? Osteoporosis   ? PAF (paroxysmal atrial fibrillation) ([place]  ? a. 05/2017 Dx in setting of CVA. CHA2DS2VASc = 6-->Eliquis.  ? Pre-diabetes   ? Pulmonary emboli ([place]CTerry11/27/2018  ? Right lower lobe pulmonary embolism small segmental, multifocal multifocal pneumonia, mediastinal lymphadenopathy, moderate centrilobular emphysema  ? Seizures ([place]  ? Sigmoid diverticulosis   ? a.) CT 01/27/2021: severe  ? [facility] ([facility]  ? a. 05/2017 - hospitalized in [place]->prolonged hospitalization in setting of R CEA, PE, and finding of RCA dzs on cath; b. Residual left sided weakness to arm and leg.  ? ? ?Past Surgical History:  ?Procedure Laterality Date  ? brain aneurysm with clip    ? COLONOSCOPY WITH PROPOFOL N/A 07/23/2020  ? Procedure: COLONOSCOPY WITH PROPOFOL;  Surgeon: VaLin LandsmanMD;  [facility];  Service: Gastroenterology;  Laterality: N/A;  ? CORONARY ANGIOPLASTY WITH STENT PLACEMENT  07/08/2017  ? INCISION AND DRAINAGE HIP Right 10/16/2021  ? Procedure: IRRIGATION AND DEBRIDEMENT OF INFECTED TOTAL HIP;  Surgeon: MeHessie KnowsMD;  [facility];  Service: Orthopedics;  Laterality: Right;  ? PORTA CATH INSERTION N/A 08/31/2021  ? Procedure: PORTA CATH INSERTION;  Surgeon: DeAlgernon HuxleyMD;  [facility];  Service: Cardiovascular;  Laterality: N/A;  ? TOTAL HIP ARTHROPLASTY Right   ? VIDEO  BRONCHOSCOPY WITH ENDOBRONCHIAL NAVIGATION N/A 04/23/2020  ? Procedure: VIDEO BRONCHOSCOPY WITH ENDOBRONCHIAL NAVIGATION;  Surgeon: GoTyler PitaMD;  Location: [facility] ORS;  Service: Pulmonary;  Laterality: N/A;  ? VIDEO BRONCHOSCOPY WITH ENDOBRONCHIAL ULTRASOUND Left 08/12/2021  ? Procedure: VIDEO BRONCHOSCOPY WITH ENDOBRONCHIAL ULTRASOUND;  Surgeon: GoTyler PitaMD;  Location: [facility] ORS;  Service: Cardiopulmonary;  Laterality: Left;  ? ? ?Social History ?Social History  ? ?Tobacco Use  ? Smoking status: Former  ?  Packs/day: 2.00  ?  Years: 53.00  ?  Pack years: 106.00  ?  Types: Cigarettes  ?  Quit date: 05/28/2017  ?  Years since quitting: 4.4  ? Smokeless tobacco: Never  ? Tobacco comments:  ?  Quit in 2018  ?Vaping Use  ? Vaping Use: Never used  ?Substance Use Topics  ? Alcohol use: Not Currently  ?  Drug use: Not Currently  ?  Types: Marijuana  ?  Comment: last smoke x1 month ago  ? ? ?Family History ?Family History  ?Problem Relation Age of Onset  ? Diabetes Mother   ? Heart disease Mother   ? Stroke Father   ? Heart disease Father   ? Heart attack Father   ? Alcohol abuse Father   ? Heart disease Sister   ? Heart attack Sister   ? Heart disease Maternal Grandmother   ? Heart attack Maternal Grandmother   ? Heart attack Paternal Grandmother   ? Diabetes Brother   ? Heart disease Brother   ? Heart attack Brother   ? ? ?Allergies  ?Allergen Reactions  ? Tape Rash  ?  plastic  ? Wound Dressing Adhesive Itching and Rash  ?  Gets stuck to skin, makes wound spread  ? ? ? ?REVIEW OF SYSTEMS (Negative unless checked) ? ?Constitutional: []Weight loss  []Fever  []Chills ?Cardiac: []Chest pain   []Chest pressure   []Palpitations   []Shortness of breath when laying flat   []Shortness of breath with exertion. ?Vascular:  []Pain in legs with walking   []Pain in legs at rest  []History of DVT   []Phlebitis   []Swelling in legs   []Varicose veins   []Non-healing ulcers ?Pulmonary:   []Uses home oxygen   []Productive  cough   []Hemoptysis   []Wheeze  []COPD   []Asthma ?Neurologic:  []Dizziness   []Seizures   []History of stroke   []History of TIA  []Aphasia   []Vissual changes   []Weakness or numbness in arm   []Weakness or

## 2021-11-10 NOTE — Assessment & Plan Note (Addendum)
discussed code status and EOL wishes at length, I reassured them that do not think he is in danger of immediate decompensation but he is quite ill and this was stressed to him and his wife in terms of the unlikelihood of benefit of resuscitative measures and the potential that these treatments may cause pain/harm if he suffers a cardiac/respiratory arrest. His wife reports he had a DNR in [place], has advanced directives from [place] scanned to [facility], updated code status in [place]. Confirmed DNR w/ patient and wife who is at bedside. Patient has been intermittently confused (likely uremic) but in my determination he has capacity to provide informed consent/refusal regarding resuscitative treatments.  ?

## 2021-11-10 NOTE — Interval H&P Note (Signed)
History and Physical Interval Note: ? ?11/10/2021 ?2:27 PM ? ?Richard Wiggins  has presented today for surgery, with the diagnosis of End-stage renal disease.  The various methods of treatment have been discussed with the patient and family. After consideration of risks, benefits and other options for treatment, the patient has consented to  Procedure(s): ?DIALYSIS/PERMA CATHETER INSERTION (N/A) as a surgical intervention.  The patient's history has been reviewed, patient examined, no change in status, stable for surgery.  I have reviewed the patient's chart and labs.  Questions were answered to the patient's satisfaction.   ? ? ?[name] ? ? ?

## 2021-11-10 NOTE — Op Note (Signed)
[facility] ? ? ?OPERATIVE NOTE ? ? ? ? ?PROCEDURE: ?1. Insertion of a left IJ tunneled dialysis catheter. ?2. Catheter placement and cannulation under ultrasound and fluoroscopic guidance ? ?PRE-OPERATIVE DIAGNOSIS: Acute on chronic renal failure; end-stage renal requiring hemodialysis ? ?POST-OPERATIVE DIAGNOSIS: same as above ? ?SURGEON: Richard Wiggins ? ?ANESTHESIA: Conscious sedation was administered under my direct supervision by the interventional radiology RN. IV Versed plus fentanyl were utilized. Continuous ECG, pulse oximetry and blood pressure was monitored throughout the entire procedure.  Conscious sedation was for a total of 29 minutes and 14 seconds. ? ?ESTIMATED BLOOD LOSS: Minimal ? ?FINDING(S): ?1.  Tips of the catheter in the right atrium on fluoroscopy ?2.  No obvious pneumothorax on fluoroscopy ? ?SPECIMEN(S):  none ? ?INDICATIONS:   ?Richard Wiggins is a 71 y.o. male  presents with end stage renal disease.  Therefore, the patient requires a tunneled dialysis catheter placement.  The patient is informed of  the risks catheter placement include but are not limited to: bleeding, infection, central venous injury, pneumothorax, possible venous stenosis, possible malpositioning in the venous system, and possible infections related to long-term catheter presence.  The patient was aware of these risks and agreed to proceed. ? ?DESCRIPTION: ?The patient was taken back to Special Procedure suite.  Prior to sedation, the patient was given IV antibiotics.  After obtaining adequate sedation, the patient was prepped and draped in the standard fashion for a left IJ tunneled dialysis catheter placement.  Appropriate Time Out is called.    ? ?The the left neck and chest wall are then infiltrated with 1% Lidocaine with epinepherine.  A 23 cm tip to cuff catheter is then selected, opened on the back table and prepped. Ultrasound is placed in a sterile sleeve.  Under ultrasound guidance,  the left internal jugular vein is examined and is noted to be echolucent and easily compressible indicating patency.   An image is recorded for the permanent record.  The left internal jugular vein is cannulated with the microneedle under direct ultrasound vissualization.  A Microwire followed by a micro sheath is then inserted without difficulty.   A J-wire was then advanced under fluoroscopic guidance into the inferior vena cava and the wire was secured.  Small counter incision was then made at the wire insertion site. A small pocket was fashioned with blunt dissection to allow easier passage of the cuff. ? ?The dilator and peel-away sheath are then advanced over the wire under fluoroscopic guidance. The catheters and advanced through the peel-away sheath. It is approximated to the left chest wall after verifying the tips at the atrial caval junction and an exit site is selected. ? ?Small incision is made at the selected exit site and the tunneling device was passed subcutaneously to the counter incision. Catheter is then pulled through the subcutaneous tunnel. The catheter is then verified for tip position under fluoroscopy, transected and the hub assembly connected. ? ?  Each port was tested by aspirating and flushing.  No resistance was noted.  Each port was then thoroughly flushed with heparinized saline.  The catheter was secured in placed with two interrupted stitches of 0 silk tied to the catheter.  The counter incision was closed with a U-stitch of 4-0 Monocryl.  The insertion site is then cleaned and sterile bandages applied including a Biopatch.  Each port was then packed with concentrated heparin (1000 Units/mL) at the manufacturer recommended volumes to each port.  Sterile caps were applied to  each port.  On completion fluoroscopy, the tips of the catheter were in the right atrium, and there was no evidence of pneumothorax. ? ?COMPLICATIONS: None ? ?CONDITION: Unchanged ? ? ?Richard Wiggins ?Richard Wiggins  [facility] ?Office: [phone] ? ? ?11/10/2021, 3:06 PM ? ? ?  ?

## 2021-11-10 NOTE — Progress Notes (Signed)
Taking patient to dialysis now. Wife is going back to room to wait. Report given to floor nurse ?

## 2021-11-11 ENCOUNTER — Encounter: Payer: Self-pay | Admitting: Vascular Surgery

## 2021-11-11 DIAGNOSIS — N189 Chronic kidney disease, unspecified: Secondary | ICD-10-CM | POA: Diagnosis not present

## 2021-11-11 DIAGNOSIS — N179 Acute kidney failure, unspecified: Secondary | ICD-10-CM | POA: Diagnosis not present

## 2021-11-11 LAB — CULTURE, BLOOD (ROUTINE X 2)
Culture: NO GROWTH
Culture: NO GROWTH
Special Requests: ADEQUATE
Special Requests: ADEQUATE

## 2021-11-11 LAB — CBC
HCT: 25.9 % — ABNORMAL LOW (ref 39.0–52.0)
Hemoglobin: 8.2 g/dL — ABNORMAL LOW (ref 13.0–17.0)
MCH: 27.7 pg (ref 26.0–34.0)
MCHC: 31.7 g/dL (ref 30.0–36.0)
MCV: 87.5 fL (ref 80.0–100.0)
Platelets: 152 10*3/uL (ref 150–400)
RBC: 2.96 MIL/uL — ABNORMAL LOW (ref 4.22–5.81)
RDW: 20.5 % — ABNORMAL HIGH (ref 11.5–15.5)
WBC: 4.4 10*3/uL (ref 4.0–10.5)
nRBC: 0 % (ref 0.0–0.2)

## 2021-11-11 LAB — BASIC METABOLIC PANEL
Anion gap: 10 (ref 5–15)
BUN: 48 mg/dL — ABNORMAL HIGH (ref 8–23)
CO2: 23 mmol/L (ref 22–32)
Calcium: 8.3 mg/dL — ABNORMAL LOW (ref 8.9–10.3)
Chloride: 108 mmol/L (ref 98–111)
Creatinine, Ser: 5.44 mg/dL — ABNORMAL HIGH (ref 0.61–1.24)
GFR, Estimated: 11 mL/min — ABNORMAL LOW (ref >60)
Glucose, Bld: 91 mg/dL (ref 70–99)
Potassium: 3.4 mmol/L — ABNORMAL LOW (ref 3.5–5.1)
Sodium: 141 mmol/L (ref 135–145)

## 2021-11-11 LAB — HEPATITIS B SURFACE ANTIBODY, QUANTITATIVE: Hep B S AB Quant (Post): <3.1 m[IU]/mL — ABNORMAL LOW (ref >9.9)

## 2021-11-11 MED ORDER — BISACODYL 5 MG PO TBEC
5.0000 mg | DELAYED_RELEASE_TABLET | Freq: Every day | ORAL | Status: DC
  Administered 2021-11-11 – 2021-11-19 (×7): 5 mg via ORAL
  Filled 2021-11-11 (×8): qty 1

## 2021-11-11 MED ORDER — HEPARIN SODIUM (PORCINE) 1000 UNIT/ML IJ SOLN
INTRAMUSCULAR | Status: AC
Start: 2021-11-11 — End: 2021-11-12
  Filled 2021-11-11: qty 10

## 2021-11-11 MED ORDER — APIXABAN 5 MG PO TABS
5.0000 mg | ORAL_TABLET | Freq: Two times a day (BID) | ORAL | Status: DC
  Administered 2021-11-11 – 2021-11-19 (×15): 5 mg via ORAL
  Filled 2021-11-11 (×15): qty 1

## 2021-11-11 NOTE — Progress Notes (Signed)
Hemodialysis Post Treatment Note ? ?Patient completes day 2 of 3 of new patient start. Catheter functions, prescribed BFR is maintained, 0 fluid removed. Patient O2 level dropped during treatment started on O2 at 3 LPM via [place], removed prior to discharge from suite. Patient without concerns, will receive third treatment on Thursday.  ?

## 2021-11-11 NOTE — Progress Notes (Signed)
[place] at [facility] ? ? ?PATIENT NAME: Richard Wiggins   ? ?MR#:  [id number] ? ?DATE OF BIRTH:  Oct 09, 1950 ? ?SUBJECTIVE:  ?patient just got back from hemodialysis. He seems to be doing a little better than yesterday. Complains of bilateral shoulder pain from using walker. Appears weak. ?No family at bedside. ? ? ?VITALS:  ?Blood pressure (!) 181/85, pulse 77, temperature 97.8 ?F (36.6 ?C), resp. rate 18, height 5\' 10"  (1.778 m), weight 82.1 kg, SpO2 95 %. ? ?PHYSICAL EXAMINATION:  ? ?GENERAL:  71 y.o.-year-old patient lying in the bed with no acute distress. Deconditioned ?LUNGS: Normal breath sounds bilaterally, no wheezing, rales, rhonchi. Left chest perm cath ?CARDIOVASCULAR: S1, S2 normal. No murmurs ?ABDOMEN: Soft, nontender, nondistended. Bowel sounds present.  ?EXTREMITIES: No  edema b/l.    ?NEUROLOGIC: nonfocal  patient is alert ? ?LABORATORY PANEL:  ?CBC ?Recent Labs  ?Lab 11/11/21 ?0416  ?WBC 4.4  ?HGB 8.2*  ?HCT 25.9*  ?PLT 152  ? ? ?Chemistries  ?Recent Labs  ?Lab 11/05/21 ?1039 11/06/21 ?0505 11/11/21 ?0416  ?NA 134*   < > 141  ?K 4.0   < > 3.4*  ?CL 102   < > 108  ?CO2 22   < > 23  ?GLUCOSE 142*   < > 91  ?BUN 60*   < > 48*  ?CREATININE 4.63*   < > 5.44*  ?CALCIUM 9.1   < > 8.3*  ?AST 67*  --   --   ?ALT 34  --   --   ?ALKPHOS 110  --   --   ?BILITOT 0.9  --   --   ? < > = values in this interval not displayed.  ? ? ?Assessment and Plan ?Mr. Klumpp is a 71 year old Caucasian male, PMH COPD, HTN, HLD, PE, seizure disorder, CVA, anxiety, recent diagnosis of septic right hip status post I&D on 10/16/2021 followed with IV antibiotic therapy cefepime/daptomycin.  He presented to ED 11/05/2021 with chief complaint altered mental status and weakness.   ? ?Acute kidney injury superimposed on chronic kidney disease ([place]) likely ATN/prerenal azotemia d/t poor po intake and nephrotoxic Rx  ?---Creatinine  is increased from 4.6 on 11/05/21 to 5.1 on 11/06/21, to 6.11 on 11/07/21  --> nephrology consulted ?-Creatinine about the same on 11/08/2021, 6.10 --> worse on 05/01 to 6.5 --> 7.0 on 11/10/21  ?--Planning permacath placement and hemodialysis  ?--Avoid nephrotoxins (holding home entresto, holding diuresis unless absolutely needed, holding home eliquis) ?--5/2 pt has perm cath placement  ?--5/3-- started on hemodialysis ? ?Septic arthritis of hip ([place]) ?--now associated with right hip fluid collection possibly seroma less likely abscess ?--Per infectious disease recs: IV cefepime and daptomycin --> cefepime switched to ceftriaxone to avoid encephalopathy, daptomycin ordered q 48 hours  ?--orthopedic consultation with Dr Rudene Christians- some fluid accumulation is expected, no surgery planned for now given his other risk factors but may consider IR intervention to aspirate the fluid/abscess  ?  ?Acute metabolic encephalopathy ?--This is likely secondary to his AKI as well as septic right hip arthritis. ?--pt more lucid ?  ?Acute lower UTI ?--Likely contributing to metabolic encephalopathy and AKI ?IV cefepime --> cefrtiaxone per ID ?--follow urine culture-- less than 10,000 and significant growth ?   ?Paroxysmal atrial fibrillation (HCC) ?--Held anticoagulation for now given anemia / bleed risk, may restart w/ heparin vs home eliquis after cath placement / if Hgb stays up ?--5/3--hgb 8.2--resume eliquis ?  ?Dyslipidemia ?C--ontinue statin  therapy at reduced dose to avoid hepatotoxicity w/ daptomycin ?  ?Normocytic anemia ?--Recent surgery, some anemia expected  ?--Hgb improved post PRBC ?--s/p BM biopsy 11/09/21, pathology pending, HemOnc following  ?  ?Chronic systolic CHF (congestive heart failure) ([place]) ?--Not in exacerbation as of admission, however needs fluids to treat AKI (Cr is increased from 4.6 yesterday 11/05/21 to 5.1 11/06/21,  6.1 on 11/07/21) ?--Last Echo on file 02/2021 EF 55-60% ?--Holding entresto and lasix for now given AKI ?  ?COPD (chronic obstructive pulmonary disease)  ([place]) ?--Declining to use his O2 and inhalers as of AM on 05/01, encouraged use of these  ?  ?  ?  ? ? ?Procedures:perm cath placement, Bone marrow biospy ?Family communication none today ?Consults : G.I., orthopedic, ID, hematology ?CODE STATUS: DNR ?DVT Prophylaxis : eliquis ?Level of care: Telemetry Medical ?Status is: Inpatient ?Remains inpatient appropriate because: multiple medical issues with ongoing IV antibiotics and starting new dialysis ?  ? ?TOTAL TIME TAKING CARE OF THIS PATIENT: 30 minutes.  ?>50% time spent on counselling and coordination of care ? ?Note: This dictation was prepared with Dragon dictation along with smaller phrase technology. Any transcriptional errors that result from this process are unintentional. ? ?Fritzi Mandes M.D  ? ? ?[facility]  ? ?CC: ?Primary care physician; Clinic, [facility]  ?

## 2021-11-11 NOTE — Progress Notes (Signed)
?[facility]  ?PROGRESS NOTE  ? ?Subjective:  ? ?Patient seen sitting up in chair, completing breakfast ?Wife at bedside ?Patient states he feels a little better today than yesterday.  Denies pain or discomfort from PermCath ?Continues to complain of nausea with meals ? ?Urine output 800 mL in preceding 24 hours ?Dialysis initiated yesterday, tolerated well ? ?Objective:  ?Vital signs: ?Blood pressure (!) 165/92, pulse 67, temperature 98.4 ?F (36.9 ?C), temperature source Oral, resp. rate 15, height _0  (1.778 m), weight 81.9 kg, SpO2 (!) 86 %. ? ?Intake/Output Summary (Last 24 hours) at 11/11/2021 1124 ?Last data filed at 11/11/2021 0500 ?Gross per 24 hour  ?Intake --  ?Output 601 ml  ?Net -601 ml  ? ? ?Filed Weights  ? 11/10/21 1730 11/10/21 1935 11/11/21 1102  ?Weight: 85 kg 84.6 kg 81.9 kg  ? ? ? ?Physical Exam: ?General:  No acute distress  ?Head:  Normocephalic, atraumatic. Dry oral mucosal membranes  ?Eyes:  Anicteric  ?Lungs:  Clear to auscultation, normal effort  ?Heart:  S1S2 no rubs  ?Abdomen:   Soft, nontender, bowel sounds present  ?Extremities: No peripheral edema.  ?Neurologic:  Awake, alert, following commands  ?Skin:  No lesions  ?Access: Left PermCath placed on 11/10/2021 by Dr. Lucky Cowboy  ? ? ?Basic Metabolic Panel: ?Recent Labs  ?Lab 11/07/21 ?0909 11/08/21 ?1537 11/09/21 ?0505 11/10/21 ?0310 11/11/21 ?0416  ?NA 138 139 142 141 141  ?K 4.1 3.7 3.9 3.4* 3.4*  ?CL 109 111 115* 111 108  ?CO2 17* 20* 17* 18* 23  ?GLUCOSE 89 96 101* 109* 91  ?BUN 70* 71* 72* 70* 48*  ?CREATININE 6.11* 6.10* 6.56* 7.00* 5.44*  ?CALCIUM 8.5* 8.0* 8.0* 7.4* 8.3*  ? ? ? ?CBC: ?Recent Labs  ?Lab 11/05/21 ?1039 11/06/21 ?0505 11/07/21 ?9432 11/08/21 ?7614 11/09/21 ?0505 11/10/21 ?0310 11/11/21 ?0416  ?WBC 5.7   < > 4.7 4.2 4.4 4.4 4.4  ?NEUTROABS 4.4  --   --   --  3.2  --   --   ?HGB 8.0*   < > 8.3* 7.3* 7.9* 7.5* 8.2*  ?HCT 26.1*   < > 26.7* 23.2* 25.7* 23.7* 25.9*  ?MCV 89.4   < > 88.1 87.5 88.3 88.8 87.5  ?PLT 236    < > 189 165 163 148* 152  ? < > = values in this interval not displayed.  ? ? ? ? ?Urinalysis: ?No results for input(s): COLORURINE, LABSPEC, [place], GLUCOSEU, HGBUR, BILIRUBINUR, KETONESUR, PROTEINUR, UROBILINOGEN, NITRITE, LEUKOCYTESUR in the last 72 hours. ? ?Invalid input(s): APPERANCEUR ?  ? ? ?Imaging: ?PERIPHERAL VASCULAR CATHETERIZATION ? ?Result Date: 11/10/2021 ?See surgical note for result. ? ?CT BONE MARROW BIOPSY & ASPIRATION ? ?Result Date: 11/09/2021 ?INDICATION: Anemia EXAM: CT GUIDED BONE MARROW ASPIRATES AND BIOPSY MEDICATIONS: None. ANESTHESIA/SEDATION: Fentanyl 50 mcg IV; Versed 1 mg IV Moderate Sedation Time:  10 minutes The patient was continuously monitored during the procedure by the interventional radiology nurse under my direct supervision. COMPLICATIONS: None immediate. PROCEDURE: The procedure was explained to the patient. The risks and benefits of the procedure were discussed and the patient's questions were addressed. Informed consent was obtained from the patient. The patient was placed prone on CT table. Images of the pelvis were obtained. The right side of back was prepped and draped in sterile fashion. The skin and right posterior ilium were anesthetized with 1% lidocaine. 11 gauge bone needle was directed into the right ilium with CT guidance. Two aspirates and 1 core biopsy  were obtained. Bandage placed over the puncture site. IMPRESSION: CT guided bone marrow aspiration and core biopsy. Electronically Signed   By: Miachel Roux M.D.   On: 11/09/2021 13:12   ? ? ?Medications:  ? ? cefTRIAXone (ROCEPHIN)  IV 2 g (11/10/21 1107)  ? DAPTOmycin (CUBICIN)  IV 650 mg (11/08/21 1122)  ? sodium bicarbonate 150 mEq in D5W infusion 50 mL/hr at 11/09/21 1219  ? ? budesonide (PULMICORT) nebulizer solution  0.25 mg Nebulization BID  ? carvedilol  12.5 mg Oral BID  ? Chlorhexidine Gluconate Cloth  6 each Topical Daily  ? lidocaine  2 patch Transdermal Q24H  ? multivitamin with minerals  1 tablet  Oral Daily  ? oxyCODONE  5 mg Oral Q12H  ? polyethylene glycol  17 g Oral Daily  ? scopolamine  1 patch Transdermal Q72H  ? tamsulosin  0.4 mg Oral Daily  ? umeclidinium-vilanterol  1 puff Inhalation Daily  ? ? ?Assessment/ Plan:  ?   ?Principal Problem: ?  Acute kidney injury superimposed on chronic kidney disease ([place]) likely ATN/prerenal azotemia d/t poor po intake and nephrotoxic Rx  ?Active Problems: ?  COPD (chronic obstructive pulmonary disease) ([place]) ?  Chronic systolic CHF (congestive heart failure) ([place]) ?  Normocytic anemia ?  Septic arthritis of hip ([place]) ?  Dyslipidemia ?  Paroxysmal atrial fibrillation ([facility]) ?  Acute metabolic encephalopathy ?  Acute lower UTI ?  Counseling regarding advance care planning and goals of care ? ?71 y.o. male with a PMHx of hypertension, coronary artery disease, congestive heart failure, COPD, history of lung cancer now with recurrence on radiation and chemotherapy, atrial fibrillation, s/p clipping of the brain aneurysm, chronic kidney disease with anemia now found to have worsening renal indicis and hence a renal consultation.  Patient recently had right hip arthroplasty followed by right hip joint effusion and is being treated for sepsis.  He was initially on daptomycin and cefepime.  Presently on daptomycin every other day along with cefepime. ?Patient developed acute kidney injury on the top of chronic kidney disease.  Initially the creatinine was 1.8 subsequently went up to 6.11 and plateaued today at the same level.   ?He had a CT scan with renal stone survey which did not reveal any evidence of hydronephrosis. ?He was started on IV fluids presently with isotonic saline at 125 cc an hour. ? ? ?#1: Acute kidney injury: Patient with acute kidney injury on the top of chronic kidney disease most likely secondary to severe prerenal azotemia due to decreased p.o. intake on the top of being on diuretics and Entresto.  There may be a component of ATN.  ? Appreciate vascular  surgery placing on 11/10/2021.  Dialysis initiated at that time.  First treatment tolerated well, no UF.  Patient will receive second treatment today with no UF.  Adequate urine output noted with slight improvement with blood work.  We will continue dialysis and determine when renal function trial is appropriate.  ?  ?#2: Sepsis: ID note appreciated. Ceftriaxone and Daptomycin continue per primary team ?  ?#3: Anemia: Continue to monitor closely and transfuse as needed per hematology.  Hgb slightly improved to 8.2 ?  ?#4: Hypertension: Continue carvedilol at the present doses.  May add amlodipine as needed. ? BP 165/92  ? ?#5 Acute metabolic acidosis.  Corrected with sodium bicarb infusion.  IV fluids discontinued ? ? LOS: 6 ?[place] ?[facility] ?5/3/202311:24 AM ?  ?

## 2021-11-11 NOTE — Consult Note (Signed)
[facility] CM Inpatient Consult ? ? ?11/11/2021 ? ?Weston Settle ?1951-03-20 ?[id number] ? ?[place] [facility] CM) ?  ?Patient was reviewed for less than 30 days unplanned readmission and noted high unplanned readmission risk score. Assessed for post hospital chronic disease management care needs.  ?  ?Per review, patient has primary provider at [facility]. Primary MD is outside of [facility] network of physicians.  ?  ?Of note, [facility] Care Management services does not replace or interfere with any services that are arranged by inpatient case management or social work.   ? ?Netta Cedars, MSN, RN ?[facility]iaison ?Toll free office [phone]  ? ?

## 2021-11-12 DIAGNOSIS — N179 Acute kidney failure, unspecified: Secondary | ICD-10-CM | POA: Diagnosis not present

## 2021-11-12 DIAGNOSIS — T8450XD Infection and inflammatory reaction due to unspecified internal joint prosthesis, subsequent encounter: Secondary | ICD-10-CM | POA: Diagnosis not present

## 2021-11-12 DIAGNOSIS — N189 Chronic kidney disease, unspecified: Secondary | ICD-10-CM | POA: Diagnosis not present

## 2021-11-12 LAB — CK: Total CK: 396 U/L (ref 49–397)

## 2021-11-12 MED ORDER — HYDROMORPHONE HCL 2 MG PO TABS: 1.0000 mg | ORAL_TABLET | ORAL | Status: DC | PRN

## 2021-11-12 MED ORDER — HEPARIN SODIUM (PORCINE) 1000 UNIT/ML IJ SOLN
INTRAMUSCULAR | Status: AC
  Filled 2021-11-12: qty 10

## 2021-11-12 NOTE — Plan of Care (Signed)
?  Problem: Education: ?Goal: Knowledge of General Education information will improve ?Description: Including pain rating scale, medication(s)/side effects and non-pharmacologic comfort measures ?Outcome: Progressing ?  ?Problem: Health Behavior/Discharge Planning: ?Goal: Ability to manage health-related needs will improve ?Outcome: Progressing ?  ?Problem: Clinical Measurements: ?Goal: Ability to maintain clinical measurements within normal limits will improve ?Outcome: Progressing ?  ?Problem: Clinical Measurements: ?Goal: Will remain free from infection ?Outcome: Progressing ?  ?Problem: Clinical Measurements: ?Goal: Diagnostic test results will improve ?Outcome: Progressing ?  ?Problem: Clinical Measurements: ?Goal: Respiratory complications will improve ?Outcome: Progressing ?  ?Problem: Activity: ?Goal: Risk for activity intolerance will decrease ?Outcome: Progressing ?  ?Problem: Pain Managment: ?Goal: General experience of comfort will improve ?Outcome: Progressing ?  ?Problem: Safety: ?Goal: Ability to remain free from injury will improve ?Outcome: Progressing ?  ?Problem: Skin Integrity: ?Goal: Risk for impaired skin integrity will decrease ?Outcome: Progressing ?  ?

## 2021-11-12 NOTE — Evaluation (Signed)
Occupational Therapy Evaluation ?Patient Details ?Name: Richard Wiggins ?MRN: [id number] ?DOB: Oct 24, 1950 ?Today's Date: 11/12/2021 ? ? ?History of Present Illness Pt is a 71 yo male that presented to the ED for worsening weakness and confusion. Underwent dialysis catheter placement this admission as well.  Pt is currently be treated for lung cancer at cancer center, PMH includes COPD, CHF, seizure, anxiety, CVA, recently septic R hip replacement status post I&D 10/16/2021.  ? ?Clinical Impression ?  ?Pt was seen for OT evaluation this date. Prior to hospital admission, pt was requiring assist for bathing, dressing, meals, meds, and cleaning and using RW 2/2 R hip pain. Currently pt demonstrates impairments as described below (See OT problem list) which functionally limit his ability to perform ADL/self-care tasks. Pt currently requires MIN A for LB ADL tasks, anticipate supv for ADL transfers, and CGA for ADL mobility. Pt declined OOB during session 2/2 R hip pain and recently up. Pt would benefit from skilled OT services to address noted impairments and functional limitations (see below for any additional details) in order to maximize safety and independence while minimizing falls risk and caregiver burden. Upon hospital discharge, recommend HHOT to maximize pt safety and return to functional independence during meaningful occupations of daily life.  ? ?Recommendations for follow up therapy are one component of a multi-disciplinary discharge planning process, led by the attending physician.  Recommendations may be updated based on patient status, additional functional criteria and insurance authorization.  ? ?Follow Up Recommendations ? Home health OT  ?  ?Assistance Recommended at Discharge Frequent or constant Supervision/Assistance  ?Patient can return home with the following A little help with walking and/or transfers;A little help with bathing/dressing/bathroom;Assistance with cooking/housework;Assist for  transportation;Help with stairs or ramp for entrance;Direct supervision/assist for medications management ? ?  ?Functional Status Assessment ? Patient has had a recent decline in their functional status and demonstrates the ability to make significant improvements in function in a reasonable and predictable amount of time.  ?Equipment Recommendations ? None recommended by OT  ?  ?Recommendations for Other Services   ? ? ?  ?Precautions / Restrictions Precautions ?Precautions: Fall ?Restrictions ?Weight Bearing Restrictions: Yes ?RLE Weight Bearing: Weight bearing as tolerated  ? ?  ? ?Mobility Bed Mobility ?  ?  ?  ?  ?  ?  ?  ?General bed mobility comments: pt declined 2/2 R hip pain and recently up ?  ? ?Transfers ?  ?  ?  ?  ?  ?  ?  ?  ?  ?General transfer comment: pt declined 2/2 R hip pain and recently up ?  ? ?  ?Balance   ?  ?  ?  ?  ?  ?  ?  ?  ?  ?  ?  ?  ?  ?  ?  ?  ?  ?  ?   ? ?ADL either performed or assessed with clinical judgement  ? ?ADL Overall ADL's : Needs assistance/impaired ?  ?  ?  ?  ?  ?  ?  ?  ?  ?  ?  ?  ?  ?  ?  ?  ?  ?  ?  ?General ADL Comments: Pt requires MIN A For LB ADL tasks, anticipate supv for ADL transfers and CGA for ADL mobility.  ? ? ? ?Vision   ?   ?   ?Perception   ?  ?Praxis   ?  ? ?Pertinent Vitals/Pain Pain Assessment ?Pain Assessment:  0-10 ?Pain Score: 6  ?Pain Location: R hip/thigh ?Pain Descriptors / Indicators: Aching, Sore ?Pain Intervention(s): Limited activity within patient's tolerance, Monitored during session, Repositioned  ? ? ? ?Hand Dominance Right ?  ?Extremity/Trunk Assessment Upper Extremity Assessment ?Upper Extremity Assessment: Generalized weakness ?  ?Lower Extremity Assessment ?Lower Extremity Assessment: Generalized weakness;RLE deficits/detail ?RLE: Unable to fully assess due to pain ?  ?  ?  ?Communication Communication ?Communication: No difficulties ?  ?Cognition Arousal/Alertness: Awake/alert ?Behavior During Therapy: [facility] for tasks  assessed/performed ?Overall Cognitive Status: Within Functional Limits for tasks assessed ?  ?  ?  ?  ?  ?  ?  ?  ?  ?  ?  ?  ?  ?  ?  ?  ?  ?  ?  ?General Comments    ? ?  ?Exercises   ?  ?Shoulder Instructions    ? ? ?Home Living Family/patient expects to be discharged to:: Private residence ?Living Arrangements: Spouse/significant other ?Available Help at Discharge: Family;Available 24 hours/day ?Type of Home: House ?Home Access: Level entry ?  ?  ?Home Layout: Able to live on main level with bedroom/bathroom ?  ?  ?Bathroom Shower/Tub: Walk-in shower ?  ?Bathroom Toilet: Standard ?Bathroom Accessibility: Yes ?  ?Home Equipment: Rolling Walker (2 wheels);BSC/3in1;Rollator (4 wheels) ?  ?  ?  ? ?  ?Prior Functioning/Environment Prior Level of Function : Needs assist ?  ?  ?  ?Physical Assist : ADLs (physical) ?  ?ADLs (physical): Dressing;IADLs ?Mobility Comments: Patient reports he has been using his walker since the increase in hip pain ?ADLs Comments: wife assists with lower body dressing, pt performs sink bath but still requires assist from spouse; spouse also assists with med mgt, meals, and cleaning ?  ? ?  ?  ?OT Problem List: Decreased strength;Decreased knowledge of use of DME or AE;Pain ?  ?   ?OT Treatment/Interventions: Self-care/ADL training;Therapeutic exercise;Therapeutic activities;DME and/or AE instruction;Patient/family education  ?  ?OT Goals(Current goals can be found in the care plan section) Acute Rehab OT Goals ?Patient Stated Goal: go home ?OT Goal Formulation: With patient ?Time For Goal Achievement: 11/26/21 ?Potential to Achieve Goals: Good ?ADL Goals ?Pt Will Perform Lower Body Dressing: with min guard assist;with adaptive equipment;sit to/from stand ?Pt Will Transfer to Toilet: with supervision;ambulating (elevated commode, LRAD) ?Additional ADL Goal #1: Pt will complete all aspects of bathing primarily from seated position with PRN assist, AE PRN. ?Additional ADL Goal #2: Pt will  verbalize plan to implement at least 1 learned falls prevention strategy.  ?OT Frequency: Min 2X/week ?  ? ?Co-evaluation   ?  ?  ?  ?  ? ?  ?AM-PAC OT "6 Clicks" Daily Activity     ?Outcome Measure Help from another person eating meals?: None ?Help from another person taking care of personal grooming?: None ?Help from another person toileting, which includes using toliet, bedpan, or urinal?: A Little ?Help from another person bathing (including washing, rinsing, drying)?: A Little ?Help from another person to put on and taking off regular upper body clothing?: None ?Help from another person to put on and taking off regular lower body clothing?: A Little ?6 Click Score: 21 ?  ?End of Session   ? ?Activity Tolerance: Patient limited by pain ?Patient left: in bed;with call bell/phone within reach;with bed alarm set ? ?OT Visit Diagnosis: Other abnormalities of gait and mobility (R26.89);Muscle weakness (generalized) (M62.81);Pain ?Pain - Right/Left: Right ?Pain - part of body: Hip  ?              ?  Time: 1572-6203 ?OT Time Calculation (min): 10 min ?Charges:  OT General Charges ?$OT Visit: 1 Visit ?OT Evaluation ?$OT Eval Moderate Complexity: 1 Mod ? ?Ardeth Perfect., MPH, MS, OTR/L ?ascom [phone] ?11/12/21, 5:37 PM ?

## 2021-11-12 NOTE — Progress Notes (Signed)
?[facility]  ?PROGRESS NOTE  ? ?Subjective:  ? ?Patient seen and evaluated during dialysis ?  ?HEMODIALYSIS FLOWSHEET: ? ?Blood Flow Rate (mL/min): 300 mL/min ?Arterial Pressure (mmHg): -130 mmHg ?Venous Pressure (mmHg): 80 mmHg ?Transmembrane Pressure (mmHg): 50 mmHg ?Ultrafiltration Rate (mL/min): 170 mL/min ?Dialysate Flow Rate (mL/min): 500 ml/min ?Conductivity: Machine : 13.8 ?Conductivity: Machine : 13.8 ?Dialysis Fluid Bolus: Normal Saline ?Bolus Amount (mL): 250 mL ?Dialysate Change: Other (comment) (3K) ? ?No complaints ?Resting comfortable ?Nausea remains, but less frequent ? ?Objective:  ?Vital signs: ?Blood pressure (!) 175/85, pulse 69, temperature 98.2 ?F (36.8 ?C), temperature source Oral, resp. rate (!) 22, height 5\' 10"  (1.778 m), weight 83 kg, SpO2 95 %. ? ?Intake/Output Summary (Last 24 hours) at 11/12/2021 1211 ?Last data filed at 11/12/2021 0732 ?Gross per 24 hour  ?Intake 120 ml  ?Output 350 ml  ?Net -230 ml  ? ? ?Filed Weights  ? 11/11/21 1102 11/11/21 1342 11/12/21 0930  ?Weight: 81.9 kg 82.1 kg 83 kg  ? ? ? ?Physical Exam: ?General:  No acute distress  ?Head:  Normocephalic, atraumatic. Dry oral mucosal membranes  ?Eyes:  Anicteric  ?Lungs:  Clear to auscultation, normal effort  ?Heart:  S1S2 no rubs  ?Abdomen:   Soft, nontender, bowel sounds present  ?Extremities: No peripheral edema.  ?Neurologic:  Awake, alert, following commands  ?Skin:  No lesions  ?Access: Left PermCath placed on 11/10/2021 by Dr. Lucky Cowboy  ? ? ?Basic Metabolic Panel: ?Recent Labs  ?Lab 11/07/21 ?0909 11/08/21 ?6270 11/09/21 ?0505 11/10/21 ?0310 11/11/21 ?0416  ?NA 138 139 142 141 141  ?K 4.1 3.7 3.9 3.4* 3.4*  ?CL 109 111 115* 111 108  ?CO2 17* 20* 17* 18* 23  ?GLUCOSE 89 96 101* 109* 91  ?BUN 70* 71* 72* 70* 48*  ?CREATININE 6.11* 6.10* 6.56* 7.00* 5.44*  ?CALCIUM 8.5* 8.0* 8.0* 7.4* 8.3*  ? ? ? ?CBC: ?Recent Labs  ?Lab 11/07/21 ?0909 11/08/21 ?3500 11/09/21 ?0505 11/10/21 ?0310 11/11/21 ?0416  ?WBC 4.7 4.2 4.4  4.4 4.4  ?NEUTROABS  --   --  3.2  --   --   ?HGB 8.3* 7.3* 7.9* 7.5* 8.2*  ?HCT 26.7* 23.2* 25.7* 23.7* 25.9*  ?MCV 88.1 87.5 88.3 88.8 87.5  ?PLT 189 165 163 148* 152  ? ? ? ? ?Urinalysis: ?No results for input(s): COLORURINE, LABSPEC, [place], GLUCOSEU, HGBUR, BILIRUBINUR, KETONESUR, PROTEINUR, UROBILINOGEN, NITRITE, LEUKOCYTESUR in the last 72 hours. ? ?Invalid input(s): APPERANCEUR ?  ? ? ?Imaging: ?PERIPHERAL VASCULAR CATHETERIZATION ? ?Result Date: 11/10/2021 ?See surgical note for result.  ? ? ?Medications:  ? ? cefTRIAXone (ROCEPHIN)  IV 2 g (11/10/21 1107)  ? DAPTOmycin (CUBICIN)  IV 650 mg (11/11/21 1736)  ? ? apixaban  5 mg Oral BID  ? bisacodyl  5 mg Oral Daily  ? budesonide (PULMICORT) nebulizer solution  0.25 mg Nebulization BID  ? carvedilol  12.5 mg Oral BID  ? Chlorhexidine Gluconate Cloth  6 each Topical Daily  ? heparin sodium (porcine)      ? lidocaine  2 patch Transdermal Q24H  ? multivitamin with minerals  1 tablet Oral Daily  ? oxyCODONE  5 mg Oral Q12H  ? polyethylene glycol  17 g Oral Daily  ? scopolamine  1 patch Transdermal Q72H  ? tamsulosin  0.4 mg Oral Daily  ? umeclidinium-vilanterol  1 puff Inhalation Daily  ? ? ?Assessment/ Plan:  ?   ?Principal Problem: ?  Acute kidney injury superimposed on chronic kidney disease (  [place]) likely ATN/prerenal azotemia d/t poor po intake and nephrotoxic Rx  ?Active Problems: ?  COPD (chronic obstructive pulmonary disease) ([place]) ?  Chronic systolic CHF (congestive heart failure) ([place]) ?  Normocytic anemia ?  Septic arthritis of hip ([place]) ?  Dyslipidemia ?  Paroxysmal atrial fibrillation ([facility]) ?  Acute metabolic encephalopathy ?  Acute lower UTI ?  Counseling regarding advance care planning and goals of care ? ?71 y.o. male with a PMHx of hypertension, coronary artery disease, congestive heart failure, COPD, history of lung cancer now with recurrence on radiation and chemotherapy, atrial fibrillation, s/p clipping of the brain aneurysm, chronic kidney  disease with anemia now found to have worsening renal indicis and hence a renal consultation.  Patient recently had right hip arthroplasty followed by right hip joint effusion and is being treated for sepsis.  He was initially on daptomycin and cefepime.  Presently on daptomycin every other day along with cefepime. ?Patient developed acute kidney injury on the top of chronic kidney disease.  Initially the creatinine was 1.8 subsequently went up to 6.11 and plateaued today at the same level.   ?He had a CT scan with renal stone survey which did not reveal any evidence of hydronephrosis. ?He was started on IV fluids presently with isotonic saline at 125 cc an hour. ? ? ?#1: Acute kidney injury: Patient with acute kidney injury on the top of chronic kidney disease most likely secondary to severe prerenal azotemia due to decreased p.o. intake on the top of being on diuretics and Entresto.  There may be a component of ATN.  ? Appreciate vascular surgery placing on 11/10/2021.  Dialysis initiated at that time.   ? Third dialysis treatment received today. No UF due to adequate urine output previously. Will continue to monitor this. Plan to hold dialysis tomorrow and monitor renal function.  ?  ?#2: Sepsis: ID note appreciated. Ceftriaxone and Daptomycin continue per primary team ?  ?#3: Anemia: Continue to monitor closely and transfuse as needed per hematology.  Hgb 8.2 ?  ?#4: Hypertension: Continue carvedilol at the present doses.  May add amlodipine as needed. ? BP elevated during dialysis, 175/85. ? ?#5 Acute metabolic acidosis.  Corrected with sodium bicarb infusion. ? ? ? LOS: 7 ?[place] ?[facility] ?5/4/202312:11 PM ?  ?

## 2021-11-12 NOTE — Progress Notes (Signed)
ID ?Pt is better ?Not confused ?Answers questions appropriately ?Ate lunch, walked with PT ?Had dialysis for 3 days ?Wife not at bed side ? ? ?O/e ?Awake and alert,  ?Pale ?Patient Vitals for the past 24 hrs: ? BP Temp Temp src Pulse Resp SpO2 Weight  ?11/12/21 1550 (!) 152/81 98.1 ?F (36.7 ?C) -- 80 19 96 % --  ?11/12/21 1235 (!) 172/86 97.9 ?F (36.6 ?C) Oral 68 19 94 % 83.1 kg  ?11/12/21 1215 (!) 177/83 -- -- -- 20 -- --  ?11/12/21 1200 (!) 175/85 -- -- -- (!) 22 -- --  ?11/12/21 1145 (!) 158/96 -- -- 69 (!) 0 95 % --  ?11/12/21 1130 (!) 161/77 -- -- 65 13 95 % --  ?11/12/21 1115 (!) 158/76 -- -- 70 13 95 % --  ?11/12/21 1100 122/69 -- -- 63 14 94 % --  ?11/12/21 1045 128/68 -- -- 63 13 94 % --  ?11/12/21 1030 137/74 -- -- 63 14 94 % --  ?11/12/21 1015 136/74 -- -- 63 14 94 % --  ?11/12/21 1000 (!) 159/82 -- -- 65 15 93 % --  ?11/12/21 0945 (!) 160/77 -- -- 67 15 93 % --  ?11/12/21 0930 (!) 167/80 98.2 ?F (36.8 ?C) Oral 71 (!) 29 92 % 83 kg  ?11/12/21 0731 (!) 185/76 97.9 ?F (36.6 ?C) -- 70 18 94 % --  ?11/12/21 0437 (!) 170/84 97.7 ?F (36.5 ?C) -- 69 17 93 % --  ?11/11/21 2056 (!) 179/87 98 ?F (36.7 ?C) -- 71 18 96 % --  ?11/11/21 2031 -- -- -- -- -- 95 % --  ?  ?Chest b/l air entry ?Hs1s2 ?Port present ?Abd soft ?Rt hip- surgical site okay- minimal swelling- no tenderness or erythema ?CNS non focal ? ?Labs ? ?  Latest Ref Rng & Units 11/11/2021  ?  4:16 AM 11/10/2021  ?  3:10 AM 11/09/2021  ?  5:05 AM  ?CBC  ?WBC 4.0 - 10.5 K/uL 4.4   4.4   4.4    ?Hemoglobin 13.0 - 17.0 g/dL 8.2   7.5   7.9    ?Hematocrit 39.0 - 52.0 % 25.9   23.7   25.7    ?Platelets 150 - 400 K/uL 152   148   163    ?  ? ?  Latest Ref Rng & Units 11/11/2021  ?  4:16 AM 11/10/2021  ?  3:10 AM 11/09/2021  ?  5:05 AM  ?CMP  ?Glucose 70 - 99 mg/dL 91   109   101    ?BUN 8 - 23 mg/dL 48   70   72    ?Creatinine 0.61 - 1.24 mg/dL 5.44   7.00   6.56    ?Sodium 135 - 145 mmol/L 141   141   142    ?Potassium 3.5 - 5.1 mmol/L 3.4   3.4   3.9    ?Chloride 98 - 111  mmol/L 108   111   115    ?CO2 22 - 32 mmol/L '23   18   17    ' ?Calcium 8.9 - 10.3 mg/dL 8.3   7.4   8.0    ?CK8 77 ? ?Microbiology ?Blood culture from 10/29/2021 no growth ? ?Impression/recommendation ? ?AKI on CKD. ?Had dialysis for 3 days ? ?Encephalopathy secondary to AKI- resolved ? ? 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  He was sent home on  IV antibiotics cefepime and daptomycin and the plan was to give him a total of 6 weeks.  Until 11/26/2021.  But now the antibiotics have been changed to ceftriaxone and daptomycin and adjusted for creatinine clearance.- after IV antibiotic he will need pO med for 6 more weeks- Normally would have given cipro and bactrim but cannot use quinolone because of aortic aneursym. Bactrim may be CI with kidney issues. May just do doxy ? ?CK levels  were high.  Improved with DC atorvastatin ? ?Non-small cell CA lung.  Received 2 doses of chemotherapy and status post radiation ? ?History of cerebral aneurysm status post clipping ? ?Hyperlipidemia on atorvastatin which has been discontinued now ? ?Severe anemia status post PRBC underwent bone marrow biopsy today to look for etiology ? ?CHF  ? ?COPD ? ?Advanced aortoiliac atherosclerotic disease. ?There is a 4.7 cm fusiform infrarenal abdominal aortic aneurysm. ? ?Discussed the management with patient ? ? ? ? ?OPAT Orders ? ?Diagnosis: ?Rt HIP  Prosthetic joint infection -culture negative ?Baseline Creatinine AKI on dialysis ? ? ? ?Allergies  ?Allergen Reactions  ? Tape Rash  ?  plastic  ? Wound Dressing Adhesive Itching and Rash  ?  Gets stuck to skin, makes wound spread  ? ? ?Discharge antibiotics: ?Daptomycin 634m every 48 hrs until 11/26/21 ? ?Ceftriaxone 2 grams IV every 24 hours ?Until 11/26/21 ? ?Thru PORT ? ?Labs weekly while on IV antibiotics: ?_X_ CBC with differential ? ?_X_ CMP ? ? ? ?Fax weekly labs to Dr.Vonetta Foulk [phone]? ?Clinic Follow Up Appt:  11/24/21 at 9.15 AM ? ? ?Call [phone] any questions ? ?  ? ?

## 2021-11-12 NOTE — Progress Notes (Signed)
Hemodialysis Post Treatment Note ? ?Nov 12, 2021 ? ?Access: LIJ Catheter  ? ?Scheduled Treatment Hours: 3 Hrs.  ? ?UF Removed: 0 ml ? ?Note: ?Patient completes third day of a new Hemodialysis start. CVC functional, prescribed BFR maintained throughout treatment, targeted UF reached. Dressing to CVC changed, no signs of infection. Pt in stable condition, transported to assigned room. Next scheduled treatment to be determined, kidney function being monitored for possibility of return.  ?

## 2021-11-12 NOTE — Plan of Care (Signed)
  Problem: Education: Goal: Knowledge of General Education information will improve Description Including pain rating scale, medication(s)/side effects and non-pharmacologic comfort measures Outcome: Progressing   

## 2021-11-12 NOTE — Progress Notes (Signed)
[place] at [facility] ? ? ?PATIENT NAME: Richard Wiggins   ? ?MR#:  [id number] ? ?DATE OF BIRTH:  May 13, 1951 ? ?SUBJECTIVE:  ?sitting out in the recliner. Wife at bedside. [place]. Feels a whole lot better. No more shaking spell. ? ?VITALS:  ?Blood pressure (!) 172/86, pulse 68, temperature 97.9 ?F (36.6 ?C), temperature source Oral, resp. rate 19, height 5\' 10"  (1.778 m), weight 83.1 kg, SpO2 94 %. ? ?PHYSICAL EXAMINATION:  ? ?GENERAL:  71 y.o.-year-old patient lying in the bed with no acute distress. Deconditioned ?LUNGS: Normal breath sounds bilaterally, no wheezing, rales, rhonchi. Left chest perm cath ?CARDIOVASCULAR: S1, S2 normal. No murmurs ?ABDOMEN: Soft, nontender, nondistended. Bowel sounds present.  ?EXTREMITIES: No  edema b/l.    ?NEUROLOGIC: nonfocal  patient is alert ? ?LABORATORY PANEL:  ?CBC ?Recent Labs  ?Lab 11/11/21 ?0416  ?WBC 4.4  ?HGB 8.2*  ?HCT 25.9*  ?PLT 152  ? ? ? ?Chemistries  ?Recent Labs  ?Lab 11/11/21 ?0416  ?NA 141  ?K 3.4*  ?CL 108  ?CO2 23  ?GLUCOSE 91  ?BUN 48*  ?CREATININE 5.44*  ?CALCIUM 8.3*  ? ? ? ?Assessment and Plan ?Richard Wiggins is a 71 year old Caucasian male, PMH COPD, HTN, HLD, PE, seizure disorder, CVA, anxiety, recent diagnosis of septic right hip status post I&D on 10/16/2021 followed with IV antibiotic therapy cefepime/daptomycin.  He presented to ED 11/05/2021 with chief complaint altered mental status and weakness.   ? ?Acute kidney injury superimposed on chronic kidney disease ([place]) likely ATN/prerenal azotemia d/t poor po intake and nephrotoxic Rx  ?---Creatinine  is increased from 4.6 on 11/05/21 to 5.1 on 11/06/21, to 6.11 on 11/07/21 --> nephrology consulted ?-Creatinine about the same on 11/08/2021, 6.10 --> worse on 05/01 to 6.5 --> 7.0 on 11/10/21  ?--Planning permacath placement and hemodialysis  ?--Avoid nephrotoxins (holding home entresto, holding diuresis unless absolutely needed, holding home eliquis) ?--5/2 pt has perm  cath placement  ?--5/3-- started on hemodialysis ?--5/4-- out of bed to chair. Overall feels better. Physical therapy to be started. ? ?Septic arthritis of hip ([place]) ?--now associated with right hip fluid collection possibly seroma less likely abscess ?--Per infectious disease recs: IV cefepime and daptomycin --> cefepime switched to ceftriaxone to avoid encephalopathy, daptomycin ordered q 48 hours  ?--orthopedic consultation with Dr Rudene Christians- some fluid accumulation is expected, no surgery planned for now given his other risk factors -- if needed consider IR intervention to aspirate the fluid/abscess  ?-- will start physical therapy ?  ?Acute metabolic encephalopathy ?-- secondary to his AKI as well as septic right hip arthritis. ?--pt more lucid ?  ?Acute lower UTI ?--Likely contributing to metabolic encephalopathy and AKI ?IV cefepime --> cefrtiaxone per ID ?--follow urine culture-- less than 10,000 and significant growth ?   ?Paroxysmal atrial fibrillation (HCC) ?--Held anticoagulation for now given anemia / bleed risk, may restart w/ heparin vs home eliquis after cath placement / if Hgb stays up ?--5/3--hgb 8.2--resume eliquis ?  ?Dyslipidemia ?--Continue statin therapy at reduced dose to avoid hepatotoxicity w/ daptomycin ?  ?Normocytic anemia ?--Recent surgery, some anemia expected  ?--Hgb improved post PRBC ?--s/p BM biopsy 11/09/21, pathology pending, HemOnc following  ?  ?Chronic systolic CHF (congestive heart failure) ([place]) ?--Not in exacerbation as of admission, however needs fluids to treat AKI (Cr is increased from 4.6 yesterday 11/05/21 to 5.1 11/06/21,  6.1 on 11/07/21) ?--Last Echo on file 02/2021 EF 55-60% ?--Holding entresto and lasix for now  given AKI ?  ?COPD (chronic obstructive pulmonary disease) ([place]) ?--Declining to use his O2 and inhalers as of AM on 05/01, encouraged use of these  ?  ?  ?  ? ? ?Procedures:perm cath placement, Bone marrow biospy ?Family communication wife at bedside ?Consults  : G.I., orthopedic, ID, hematology ?CODE STATUS: DNR ?DVT Prophylaxis : eliquis ?Level of care: Med-Surg ?Status is: Inpatient ?Remains inpatient appropriate because: multiple medical issues with ongoing IV antibiotics and starting new dialysis ? ?physical therapy consulted. TOC for discharge planning ?  ? ?TOTAL TIME TAKING CARE OF THIS PATIENT: 30 minutes.  ?>50% time spent on counselling and coordination of care ? ?Note: This dictation was prepared with Dragon dictation along with smaller phrase technology. Any transcriptional errors that result from this process are unintentional. ? ?Fritzi Mandes M.D  ? ? ?[facility]  ? ?CC: ?Primary care physician; [facility], [facility]  ?

## 2021-11-12 NOTE — Evaluation (Addendum)
Physical Therapy Evaluation ?Patient Details ?Name: Richard Wiggins ?MRN: [id number] ?DOB: 02-13-1951 ?Today's Date: 11/12/2021 ? ?History of Present Illness ? Pt is a 71 yo male that presented to the ED for worsening weakness and confusion. Underwent dialysis catheter placement this admission as well.  Pt is currently be treated for lung cancer at cancer center, PMH includes COPD, CHF, seizure, anxiety, CVA, recently septic R hip replacement status post I&D 10/16/2021. ?  ?Clinical Impression ? Patient alert, in chair with family at bedside. Pt reported 7-8/10 back pain and RN aware and administering medication at end of session. Per pt/family, at baseline he needs assistance with LE dressing, and wife performs sink bath for pt. He is ambulatory with rollator, 1 fall out of bed reported in the last 6 months. ? ?The patient was able to transfer sit <> stand with RW and CGA. He did complain of some knee buckling, mild improvement over time and with cueing for bilateral heel strike. He ambulated ~111ft with close chair follow and CGA, noticeable fatigue. No LOB noted, but decreased step height/length and gait velocity. Returned to room with RN at bedside.  Overall the patient demonstrated deficits (see "PT Problem List") that impede the patient's functional abilities, safety, and mobility and would benefit from skilled PT intervention. Recommendation is HHPT with frequent/constant supervision/assistance.    ?   ? ?Recommendations for follow up therapy are one component of a multi-disciplinary discharge planning process, led by the attending physician.  Recommendations may be updated based on patient status, additional functional criteria and insurance authorization. ? ?Follow Up Recommendations Home health PT ? ?  ?Assistance Recommended at Discharge Frequent or constant Supervision/Assistance  ?Patient can return home with the following ? A little help with walking and/or transfers;A little help with  bathing/dressing/bathroom;Assistance with cooking/housework;Direct supervision/assist for medications management;Assist for transportation;Help with stairs or ramp for entrance ? ?  ?Equipment Recommendations None recommended by PT  ?Recommendations for Other Services ?    ?  ?Functional Status Assessment Patient has had a recent decline in their functional status and demonstrates the ability to make significant improvements in function in a reasonable and predictable amount of time.  ? ?  ?Precautions / Restrictions Precautions ?Precautions: Fall ?Restrictions ?Weight Bearing Restrictions: Yes ?RLE Weight Bearing: Weight bearing as tolerated  ? ?  ? ?Mobility ? Bed Mobility ?  ?  ?  ?  ?  ?  ?  ?General bed mobility comments: NT, in recliner ?  ? ?Transfers ?Overall transfer level: Needs assistance ?Equipment used: Rolling walker (2 wheels) ?Transfers: Sit to/from Stand ?Sit to Stand: Supervision ?  ?  ?  ?  ?  ?  ?  ? ?Ambulation/Gait ?Ambulation/Gait assistance: Min guard ?Gait Distance (Feet): 160 Feet ?Assistive device: Rolling walker (2 wheels) ?Gait Pattern/deviations: Step-through pattern, Decreased step length - right, Decreased step length - left ?Gait velocity: decreased ?  ?  ?General Gait Details: Slow cadence but steady with improved activity tolerance this session; cued for heel strike,  SpO2 and HR WNL on room air ? ?Stairs ?  ?  ?  ?  ?  ? ?Wheelchair Mobility ?  ? ?Modified Rankin (Stroke Patients Only) ?  ? ?  ? ?Balance Overall balance assessment: Needs assistance ?  ?Sitting balance-Leahy Scale: Good ?  ?  ?Standing balance support: Bilateral upper extremity supported, During functional activity ?Standing balance-Leahy Scale: Good ?  ?  ?  ?  ?  ?  ?  ?  ?  ?  ?  ?  ?   ? ? ? ?  Pertinent Vitals/Pain Pain Assessment ?Pain Assessment: 0-10 ?Pain Score: 8  ?Pain Location: R hip/thigh ?Pain Descriptors / Indicators: Aching, Sore ?Pain Intervention(s): Limited activity within patient's tolerance,  Repositioned, Monitored during session, Patient requesting pain meds-RN notified  ? ? ?Home Living Family/patient expects to be discharged to:: Private residence ?Living Arrangements: Spouse/significant other ?Available Help at Discharge: Family;Available 24 hours/day ?Type of Home: House ?Home Access: Level entry ?  ?  ?  ?Home Layout: Able to live on main level with bedroom/bathroom ?Home Equipment: Rolling Walker (2 wheels);BSC/3in1;Rollator (4 wheels) ?   ?  ?Prior Function Prior Level of Function : Needs assist ?  ?  ?  ?Physical Assist : ADLs (physical) ?  ?ADLs (physical): Dressing;IADLs ?Mobility Comments: Patient reports he has been using his walker since the increase in hip pain ?ADLs Comments: wife assists with lower body dressing, pt performs sink bath ?  ? ? ?Hand Dominance  ? Dominant Hand: Right ? ?  ?Extremity/Trunk Assessment  ? Upper Extremity Assessment ?Upper Extremity Assessment: Generalized weakness ?  ? ?Lower Extremity Assessment ?Lower Extremity Assessment: Generalized weakness ?  ? ?   ?Communication  ? Communication: No difficulties  ?Cognition Arousal/Alertness: Awake/alert ?Behavior During Therapy: [facility] for tasks assessed/performed ?Overall Cognitive Status: Within Functional Limits for tasks assessed ?  ?  ?  ?  ?  ?  ?  ?  ?  ?  ?  ?  ?  ?  ?  ?  ?  ?  ?  ? ?  ?General Comments   ? ?  ?Exercises    ? ?Assessment/Plan  ?  ?PT Assessment Patient needs continued PT services  ?PT Problem List Decreased strength;Decreased range of motion;Decreased activity tolerance;Decreased balance;Decreased mobility;Decreased coordination;Decreased knowledge of precautions;Decreased safety awareness;Pain;Decreased skin integrity ? ?   ?  ?PT Treatment Interventions DME instruction;Gait training;Stair training;Functional mobility training;Therapeutic activities;Therapeutic exercise;Balance training;Neuromuscular re-education;Patient/family education;Modalities   ? ?PT Goals (Current goals can be found in  the Care Plan section)  ?Acute Rehab PT Goals ?Patient Stated Goal: to get his strength back ?PT Goal Formulation: With patient/family ?Time For Goal Achievement: 11/26/21 ?Potential to Achieve Goals: Good ? ?  ?Frequency Min 2X/week ?  ? ? ?Co-evaluation   ?  ?  ?  ?  ? ? ?  ?AM-PAC PT "6 Clicks" Mobility  ?Outcome Measure Help needed turning from your back to your side while in a flat bed without using bedrails?: A Little ?Help needed moving from lying on your back to sitting on the side of a flat bed without using bedrails?: A Little ?Help needed moving to and from a bed to a chair (including a wheelchair)?: A Little ?Help needed standing up from a chair using your arms (e.g., wheelchair or bedside chair)?: A Little ?Help needed to walk in hospital room?: A Little ?Help needed climbing 3-5 steps with a railing? : A Little ?6 Click Score: 18 ? ?  ?End of Session Equipment Utilized During Treatment: Gait belt ?Activity Tolerance: Patient tolerated treatment well ?Patient left: in chair;with call bell/phone within reach;with nursing/sitter in room;with family/visitor present ?Nurse Communication: Mobility status;Weight bearing status ?PT Visit Diagnosis: Unsteadiness on feet (R26.81);Other abnormalities of gait and mobility (R26.89);Muscle weakness (generalized) (M62.81);Difficulty in walking, not elsewhere classified (R26.2);Pain ?Pain - Right/Left: Right ?Pain - part of body: Hip ?  ? ?Time: 2979-8921 ?PT Time Calculation (min) (ACUTE ONLY): 28 min ? ? ?Charges:   PT Evaluation ?$PT Eval Low Complexity: 1 Low ?PT Treatments ?$Therapeutic Activity:  23-37 mins ?  ?   ? ?Lieutenant Diego PT, DPT ?3:49 PM,11/12/21 ? ? ?

## 2021-11-13 DIAGNOSIS — N179 Acute kidney failure, unspecified: Secondary | ICD-10-CM | POA: Diagnosis not present

## 2021-11-13 DIAGNOSIS — N189 Chronic kidney disease, unspecified: Secondary | ICD-10-CM | POA: Diagnosis not present

## 2021-11-13 LAB — RENAL FUNCTION PANEL
Albumin: 1.9 g/dL — ABNORMAL LOW (ref 3.5–5.0)
Anion gap: 8 (ref 5–15)
BUN: 19 mg/dL (ref 8–23)
CO2: 30 mmol/L (ref 22–32)
Calcium: 8.3 mg/dL — ABNORMAL LOW (ref 8.9–10.3)
Chloride: 99 mmol/L (ref 98–111)
Creatinine, Ser: 3.61 mg/dL — ABNORMAL HIGH (ref 0.61–1.24)
GFR, Estimated: 17 mL/min — ABNORMAL LOW (ref >60)
Glucose, Bld: 125 mg/dL — ABNORMAL HIGH (ref 70–99)
Phosphorus: 2.8 mg/dL (ref 2.5–4.6)
Potassium: 3.1 mmol/L — ABNORMAL LOW (ref 3.5–5.1)
Sodium: 137 mmol/L (ref 135–145)

## 2021-11-13 LAB — CBC
HCT: 24 % — ABNORMAL LOW (ref 39.0–52.0)
Hemoglobin: 7.5 g/dL — ABNORMAL LOW (ref 13.0–17.0)
MCH: 28.1 pg (ref 26.0–34.0)
MCHC: 31.3 g/dL (ref 30.0–36.0)
MCV: 89.9 fL (ref 80.0–100.0)
Platelets: 136 10*3/uL — ABNORMAL LOW (ref 150–400)
RBC: 2.67 MIL/uL — ABNORMAL LOW (ref 4.22–5.81)
RDW: 20.9 % — ABNORMAL HIGH (ref 11.5–15.5)
WBC: 4.2 10*3/uL (ref 4.0–10.5)
nRBC: 0 % (ref 0.0–0.2)

## 2021-11-13 MED ORDER — POTASSIUM CHLORIDE 10 MEQ/100ML IV SOLN
10.0000 meq | INTRAVENOUS | Status: AC
  Administered 2021-11-13 (×4): 10 meq via INTRAVENOUS
  Filled 2021-11-13 (×4): qty 100

## 2021-11-13 NOTE — Progress Notes (Signed)
Physical Therapy Treatment ?Patient Details ?Name: Richard Wiggins ?MRN: [id number] ?DOB: [birth year] ?Today's Date: 11/13/2021 ? ? ?History of Present Illness Pt is a 71 yo male that presented to the ED for worsening weakness and confusion. Underwent dialysis catheter placement this admission as well.  Pt is currently be treated for lung cancer at cancer center, PMH includes COPD, CHF, seizure, anxiety, CVA, recently septic R hip replacement status post I&D 10/16/2021. ? ?  ?PT Comments  ? ? Patient alert, agreeable to PT, did report low back pain intermittently during session. MinA to transition to EOB with extended time, bed rails, and minA for weight shift. Progressed sitting balance to good, but strong L lateral lean noted initially. Sit <> stand with RW and CGA-supervision. He was able to ambulate ~283ft with RW and CGA with close chair follow. 3 PT facilitated seated rest breaks, but pt remains very motivated to continue to ambulate as able. Did experience mild knee buckling due to fatigue at end of walking but was able to make it to EOB to rest. Pt up in chair with all needs in reach at end of session. The patient would benefit from further skilled PT intervention to continue to progress towards goals. Recommendation remains appropriate.  ? ? ?   ?Recommendations for follow up therapy are one component of a multi-disciplinary discharge planning process, led by the attending physician.  Recommendations may be updated based on patient status, additional functional criteria and insurance authorization. ? ?Follow Up Recommendations ? Home health PT ?  ?  ?Assistance Recommended at Discharge Frequent or constant Supervision/Assistance  ?Patient can return home with the following A little help with walking and/or transfers;A little help with bathing/dressing/bathroom;Assistance with cooking/housework;Direct supervision/assist for medications management;Assist for transportation;Help with stairs or ramp for entrance ?   ?Equipment Recommendations ? None recommended by PT  ?  ?Recommendations for Other Services   ? ? ?  ?Precautions / Restrictions Precautions ?Precautions: Fall ?Restrictions ?Weight Bearing Restrictions: No ?RLE Weight Bearing: Weight bearing as tolerated  ?  ? ?Mobility ? Bed Mobility ?Overal bed mobility: Needs Assistance ?Bed Mobility: Supine to Sit ?  ?  ?Supine to sit: Min assist ?  ?  ?General bed mobility comments: extra time, use of bed rails, minA to help with anterior scoot ?  ? ?Transfers ?Overall transfer level: Needs assistance ?Equipment used: Rolling walker (2 wheels) ?Transfers: Sit to/from Stand ?Sit to Stand: Supervision, Min guard ?  ?  ?  ?  ?  ?General transfer comment: CGA initially, due to L lateral lean, but improved with sit <> Stand repetitions ?  ? ?Ambulation/Gait ?Ambulation/Gait assistance: Min guard ?Gait Distance (Feet): 200 Feet ?Assistive device: Rolling walker (2 wheels) ?Gait Pattern/deviations: Step-through pattern, Decreased step length - right, Decreased step length - left ?Gait velocity: decreased ?  ?  ?General Gait Details: pt with increased shuffling step with fatigue. 3 sitting rest breaks facilitated by PT. spO2 and HR WNL on room air ? ? ?Stairs ?  ?  ?  ?  ?  ? ? ?Wheelchair Mobility ?  ? ?Modified Rankin (Stroke Patients Only) ?  ? ? ?  ?Balance Overall balance assessment: Needs assistance ?  ?Sitting balance-Leahy Scale: Fair ?Sitting balance - Comments: improved to good with time, initially L lateral lean noted ?  ?Standing balance support: Bilateral upper extremity supported, Reliant on assistive device for balance ?Standing balance-Leahy Scale: Fair ?Standing balance comment: more reliant on UE support during standing today due to fatigue ?  ?  ?  ?  ?  ?  ?  ?  ?  ?  ?  ?  ? ?  ?  Cognition Arousal/Alertness: Awake/alert ?Behavior During Therapy: [facility] for tasks assessed/performed ?Overall Cognitive Status: Within Functional Limits for tasks assessed ?  ?  ?  ?  ?  ?   ?  ?  ?  ?  ?  ?  ?  ?  ?  ?  ?  ?  ?  ? ?  ?Exercises   ? ?  ?General Comments   ?  ?  ? ?Pertinent Vitals/Pain Pain Assessment ?Pain Assessment: 0-10 ?Pain Score: 7  ?Pain Location: low back ?Pain Descriptors / Indicators: Aching, Sore ?Pain Intervention(s): Limited activity within patient's tolerance, Monitored during session, Repositioned  ? ? ?Home Living   ?  ?  ?  ?  ?  ?  ?  ?  ?  ?   ?  ?Prior Function    ?  ?  ?   ? ?PT Goals (current goals can now be found in the care plan section) Progress towards PT goals: Progressing toward goals ? ?  ?Frequency ? ? ? Min 2X/week ? ? ? ?  ?PT Plan Current plan remains appropriate  ? ? ?Co-evaluation   ?  ?  ?  ?  ? ?  ?AM-PAC PT "6 Clicks" Mobility   ?Outcome Measure ? Help needed turning from your back to your side while in a flat bed without using bedrails?: A Little ?Help needed moving from lying on your back to sitting on the side of a flat bed without using bedrails?: A Little ?Help needed moving to and from a bed to a chair (including a wheelchair)?: A Little ?Help needed standing up from a chair using your arms (e.g., wheelchair or bedside chair)?: A Little ?Help needed to walk in hospital room?: A Little ?Help needed climbing 3-5 steps with a railing? : A Little ?6 Click Score: 18 ? ?  ?End of Session Equipment Utilized During Treatment: Gait belt ?Activity Tolerance: Patient tolerated treatment well ?Patient left: in chair;with call bell/phone within reach;with family/visitor present ?Nurse Communication: Mobility status ?PT Visit Diagnosis: Unsteadiness on feet (R26.81);Other abnormalities of gait and mobility (R26.89);Muscle weakness (generalized) (M62.81);Difficulty in walking, not elsewhere classified (R26.2);Pain ?Pain - Right/Left: Right ?Pain - part of body: Hip ?  ? ? ?Time: 8338-2505 ?PT Time Calculation (min) (ACUTE ONLY): 32 min ? ?Charges:  $Therapeutic Activity: 23-37 mins          ?          ? ?Lieutenant Diego PT, DPT ?11:46 AM,11/13/21 ? ? ?

## 2021-11-13 NOTE — Progress Notes (Signed)
[place] at [facility] ? ? ?PATIENT NAME: Richard Wiggins   ? ?MR#:  [id number] ? ?DATE OF BIRTH:  01-07-1951 ? ?SUBJECTIVE:  ?patient ambulated with physical therapy around the nurses station ?VITALS:  ?Blood pressure (!) 189/84, pulse 62, temperature 97.9 ?F (36.6 ?C), resp. rate 18, height 5\' 10"  (1.778 m), weight 83.1 kg, SpO2 99 %. ? ?PHYSICAL EXAMINATION:  ? ?GENERAL:  71 y.o.-year-old patient lying in the bed with no acute distress. Deconditioned ?LUNGS: Normal breath sounds bilaterally, no wheezing, rales, rhonchi. Left chest perm cath ?CARDIOVASCULAR: S1, S2 normal. No murmurs ?ABDOMEN: Soft, nontender, nondistended. Bowel sounds present.  ?EXTREMITIES: No  edema b/l.    ?NEUROLOGIC: nonfocal  patient is alert ? ?LABORATORY PANEL:  ?CBC ?Recent Labs  ?Lab 11/13/21 ?1021  ?WBC 4.2  ?HGB 7.5*  ?HCT 24.0*  ?PLT 136*  ? ? ? ?Chemistries  ?Recent Labs  ?Lab 11/13/21 ?1021  ?NA 137  ?K 3.1*  ?CL 99  ?CO2 30  ?GLUCOSE 125*  ?BUN 19  ?CREATININE 3.61*  ?CALCIUM 8.3*  ? ? ? ?Assessment and Plan ?Richard Wiggins is a 71 year old Caucasian male, PMH COPD, HTN, HLD, PE, seizure disorder, CVA, anxiety, recent diagnosis of septic right hip status post I&D on 10/16/2021 followed with IV antibiotic therapy cefepime/daptomycin.  He presented to ED 11/05/2021 with chief complaint altered mental status and weakness.   ? ?Acute kidney injury superimposed on chronic kidney disease ([place]) likely ATN/prerenal azotemia d/t poor po intake and nephrotoxic Rx  ?---Creatinine  is increased from 4.6 on 11/05/21 to 5.1 on 11/06/21, to 6.11 on 11/07/21 --> nephrology consulted ?-Creatinine about the same on 11/08/2021, 6.10 --> worse on 05/01 to 6.5 --> 7.0 on 11/10/21  ?--Planning permacath placement and hemodialysis  ?--Avoid nephrotoxins (holding home entresto, holding diuresis unless absolutely needed, holding home eliquis) ?--5/2 pt has perm cath placement  ?--5/3-- started on hemodialysis ?--5/4-- out of bed  to chair. Overall feels better. Physical therapy to be started. ?--5/5-- creatinine trending down to 3.6. Hemodialysis on hold for the weekend to monitor renal function ? ?Septic arthritis of hip ([place]) ?--now associated with right hip fluid collection possibly seroma less likely abscess ?--Per infectious disease recs: IV cefepime and daptomycin --> cefepime switched to ceftriaxone to avoid encephalopathy, daptomycin ordered q 48 hours  ?--orthopedic consultation with Richard Wiggins- some fluid accumulation is expected, no surgery planned for now given his other risk factors -- if needed consider IR intervention to aspirate the fluid/abscess  ?-- startedphysical therapy ?  ?Acute metabolic encephalopathy ?-- secondary to his AKI as well as septic right hip arthritis. ?--pt more lucid ?  ?Acute lower UTI ?--Likely contributing to metabolic encephalopathy and AKI ?IV cefepime --> cefrtiaxone per ID ?--follow urine culture-- less than 10,000 and significant growth ?   ?Paroxysmal atrial fibrillation (HCC) ?--Held anticoagulation for now given anemia / bleed risk, may restart w/ heparin vs home eliquis after cath placement / if Hgb stays up ?--5/3--hgb 8.2--resume eliquis ?  ?Dyslipidemia ?--Continue statin therapy at reduced dose to avoid hepatotoxicity w/ daptomycin ?  ?Normocytic anemia ?--Recent surgery, some anemia expected  ?--Hgb improved post PRBC ?--s/p BM biopsy 11/09/21, pathology pending, HemOnc following  ?  ?Chronic systolic CHF (congestive heart failure) ([place]) ?--Not in exacerbation as of admission, however needs fluids to treat AKI (Cr is increased from 4.6 yesterday 11/05/21 to 5.1 11/06/21,  6.1 on 11/07/21) ?--Last Echo on file 02/2021 EF 55-60% ?--Holding entresto and lasix for now  given AKI ?  ?COPD (chronic obstructive pulmonary disease) ([place]) ?--Declining to use his O2 and inhalers as of AM on 05/01  ?  ?  ?  ? ? ?Procedures:perm cath placement, Bone marrow biospy ?Family communication wife at  bedside ?Consults : G.I., orthopedic, ID, hematology ?CODE STATUS: DNR ?DVT Prophylaxis : eliquis ?Level of care: Med-Surg ?Status is: Inpatient ?Remains inpatient appropriate because: multiple medical issues with ongoing IV antibiotics and starting new dialysis ? ?physical therapy consulted. TOC for discharge planning ?monitoring renal function over the weekend. Anticipate discharge early next week. ? ?TOTAL TIME TAKING CARE OF THIS PATIENT: 30 minutes.  ?>50% time spent on counselling and coordination of care ? ?Note: This dictation was prepared with Dragon dictation along with smaller phrase technology. Any transcriptional errors that result from this process are unintentional. ? ?Richard Wiggins M.D  ? ? ?[facility]  ? ?CC: ?Primary care physician; [facility]  ?

## 2021-11-13 NOTE — Progress Notes (Signed)
Occupational Therapy Treatment ?Patient Details ?Name: Richard Wiggins ?MRN: [id number] ?DOB: [birth year] ?Today's Date: 11/13/2021 ? ? ?History of present illness Pt is a 71 yo male that presented to the ED for worsening weakness and confusion. Underwent dialysis catheter placement this admission as well.  Pt is currently be treated for lung cancer at cancer center, PMH includes COPD, CHF, seizure, anxiety, CVA, recently septic R hip replacement status post I&D 10/16/2021. ?  ?OT comments ? Pt seen for OT tx this date. Pt received standing with RW and nurse tech in room preparing to go to the bathroom. Pt/spouse agreeable with transition to OT session. Pt required SBA-CGA for ADL mobility with RW. VC for RW mgt not to leave it to the side as he approached the [facility] frame over the toilet. Despite cues to allow OT to be present for toilet transfers after toileting, pt stood to complete pericare with remote supervision and without incident. Once seated in recliner, pt/spouse instructed in energy conservation principles including home/routines modifications, AE/DME (bed rail, shower chair, transport chair for outings/longer distances), falls prevention, activity pacing, and work simplification strategies. Pt/spouse verbalized understanding. Pt required additional cues for anterior weight shift prior to lift off to stand from recliner with noted improvement in transfer technique prior to returning to bed with MIN A for RLE mgt. Pt would benefit from additional skilled OT services to address pt/spouse's goals for the pt: improve activity tolerance for ADL/IADL, address home/routines modifications to incorporate more energy conservation strategies into daily routines to maximize safety/indep, and to support improved safety/indep with longer term goal of being able to get back out into the community (walk the dogs, go out to eat, go to the movies). Continue to recommend [place] as well as [place].   ? ?Recommendations for follow up  therapy are one component of a multi-disciplinary discharge planning process, led by the attending physician.  Recommendations may be updated based on patient status, additional functional criteria and insurance authorization. ?   ?Follow Up Recommendations ? Home health OT (would benefit from [place] as well)  ?  ?Assistance Recommended at Discharge Frequent or constant Supervision/Assistance  ?Patient can return home with the following ? A little help with walking and/or transfers;Assistance with cooking/housework;Assist for transportation;Help with stairs or ramp for entrance;Direct supervision/assist for medications management;A lot of help with bathing/dressing/bathroom ?  ?Equipment Recommendations ? Other (comment);Tub/shower seat (bed rail, transport chair)  ?  ?Recommendations for Other Services   ? ?  ?Precautions / Restrictions Precautions ?Precautions: Fall ?Restrictions ?Weight Bearing Restrictions: Yes ?RLE Weight Bearing: Weight bearing as tolerated  ? ? ?  ? ?Mobility Bed Mobility ?Overal bed mobility: Needs Assistance ?Bed Mobility: Sit to Supine ?  ?  ?  ?Sit to supine: Min assist ?  ?General bed mobility comments: MIN A for RLE mgt ?  ? ?Transfers ?Overall transfer level: Needs assistance ?Equipment used: Rolling walker (2 wheels) ?Transfers: Sit to/from Stand ?Sit to Stand: Min guard ?  ?  ?  ?  ?  ?General transfer comment: stood from [facility] over toilet without OT present, required VC for technique in order to complete STS transfer from recliner with cues for anterior weight shift prior to lift off with noted improvement on 2nd attempt ?  ?  ?Balance Overall balance assessment: Needs assistance ?Sitting-balance support: Single extremity supported, No upper extremity supported, Feet supported ?Sitting balance-Leahy Scale: Fair ?  ?  ?Standing balance support: Bilateral upper extremity supported, Reliant on assistive device for balance ?  Standing balance-Leahy Scale: Fair ?  ?  ?  ?  ?  ?  ?  ?  ?  ?   ?  ?  ?   ? ?ADL either performed or assessed with clinical judgement  ? ?ADL Overall ADL's : Needs assistance/impaired ?  ?  ?  ?  ?  ?  ?  ?  ?  ?  ?  ?  ?Toilet Transfer: Min guard;Rolling walker (2 wheels) ?Toilet Transfer Details (indicate cue type and reason): BSC over toilet, cues for keeping RW with him as he turned, stood for pericare after toileting without therapist present without incident (pt was previously instructed to let OT know when he was ready to stand up). ?Toileting- Water quality scientist and Hygiene: Sit to/from stand;Modified independent ?  ?  ?  ?Functional mobility during ADLs: Rolling walker (2 wheels);Min guard;Supervision/safety ?  ?  ? ?Extremity/Trunk Assessment   ?  ?  ?  ?  ?  ? ?Vision   ?  ?  ?Perception   ?  ?Praxis   ?  ? ?Cognition Arousal/Alertness: Awake/alert ?Behavior During Therapy: [facility] for tasks assessed/performed ?Overall Cognitive Status: Within Functional Limits for tasks assessed ?  ?  ?  ?  ?  ?  ?  ?  ?  ?  ?  ?  ?  ?  ?  ?  ?  ?  ?  ?   ?Exercises Other Exercises ?Other Exercises: Pt/spouse instructed in energy conservation principles including home/routines modifications, AE/DME (bed rail, shower chair, transport chair for outings/longer distances), falls prevention, activity pacing, and work simplification strategies ? ?  ?Shoulder Instructions   ? ? ?  ?General Comments Pt noted mild lightheadedness upon standing initially, resolved quickly  ? ? ?Pertinent Vitals/ Pain       Pain Assessment ?Pain Assessment: 0-10 ?Pain Score: 3  ?Pain Location: R hip ?Pain Descriptors / Indicators: Aching, Sore ?Pain Intervention(s): Limited activity within patient's tolerance, Monitored during session, Repositioned ? ?Home Living   ?  ?  ?  ?  ?  ?  ?  ?  ?  ?  ?  ?  ?  ?  ?  ?  ?  ?  ? ?  ?Prior Functioning/Environment    ?  ?  ?  ?   ? ?Frequency ? Min 2X/week  ? ? ? ? ?  ?Progress Toward Goals ? ?OT Goals(current goals can now be found in the care plan section) ? Progress  towards OT goals: Progressing toward goals ? ?Acute Rehab OT Goals ?Patient Stated Goal: go home ?OT Goal Formulation: With patient ?Time For Goal Achievement: 11/26/21 ?Potential to Achieve Goals: Good  ?Plan Discharge plan remains appropriate;Frequency remains appropriate   ? ?Co-evaluation ? ? ?   ?  ?  ?  ?  ? ?  ?AM-PAC OT "6 Clicks" Daily Activity     ?Outcome Measure ? ? Help from another person eating meals?: None ?Help from another person taking care of personal grooming?: None ?Help from another person toileting, which includes using toliet, bedpan, or urinal?: A Little ?Help from another person bathing (including washing, rinsing, drying)?: A Little ?Help from another person to put on and taking off regular upper body clothing?: None ?Help from another person to put on and taking off regular lower body clothing?: A Little ?6 Click Score: 21 ? ?  ?End of Session Equipment Utilized During Treatment: Rolling walker (2 wheels) ? ?OT Visit Diagnosis:  Other abnormalities of gait and mobility (R26.89);Muscle weakness (generalized) (M62.81);Pain ?Pain - Right/Left: Right ?Pain - part of body: Hip ?  ?Activity Tolerance Patient tolerated treatment well ?  ?Patient Left in bed;with call bell/phone within reach;with bed alarm set;with family/visitor present ?  ?Nurse Communication Other (comment) (reconnect and re-start IV) ?  ? ?   ? ?Time: 2824-1753 ?OT Time Calculation (min): 43 min ? ?Charges: OT General Charges ?$OT Visit: 1 Visit ?OT Treatments ?$Self Care/Home Management : 38-52 mins ? ?Ardeth Perfect., MPH, MS, OTR/L ?ascom [phone] ?11/13/21, 2:58 PM ? ?

## 2021-11-13 NOTE — Plan of Care (Signed)

## 2021-11-13 NOTE — Progress Notes (Signed)
? ? ? ?Hematology/Oncology Consult note ?[place]  ?[phone] [phone] ? ?Patient Care Team: ?[facility] as PCP - General ?Kate Sable, MD as PCP - Cardiology (Cardiology) ?Telford Nab, RN as Sales executive ?Noreene Filbert, MD as Referring Physician (Radiation Oncology) ?Sindy Guadeloupe, MD as Consulting Physician (Oncology)  ? ?Name of the patient: Richard Wiggins  ?[id number]  ?[birth year]  ? ?Date of visit:11/13/21 ? ?Interval history- reports feeling fatigued. Does not verbalize much ? ?Review of systems- Review of Systems  ?Constitutional:  Positive for malaise/fatigue. Negative for chills, fever and weight loss.  ?HENT:  Negative for congestion, ear discharge and nosebleeds.   ?Eyes:  Negative for blurred vision.  ?Respiratory:  Negative for cough, hemoptysis, sputum production, shortness of breath and wheezing.   ?Cardiovascular:  Negative for chest pain, palpitations, orthopnea and claudication.  ?Gastrointestinal:  Negative for abdominal pain, blood in stool, constipation, diarrhea, heartburn, melena, nausea and vomiting.  ?Genitourinary:  Negative for dysuria, flank pain, frequency, hematuria and urgency.  ?Musculoskeletal:  Negative for back pain, joint pain and myalgias.  ?Skin:  Negative for rash.  ?Neurological:  Negative for dizziness, tingling, focal weakness, seizures, weakness and headaches.  ?Endo/Heme/Allergies:  Does not bruise/bleed easily.  ?Psychiatric/Behavioral:  Negative for depression and suicidal ideas. The patient does not have insomnia.    ? ? ?Allergies  ?Allergen Reactions  ? Tape Rash  ?  plastic  ? Wound Dressing Adhesive Itching and Rash  ?  Gets stuck to skin, makes wound spread  ? ? ? ?Past Medical History:  ?Diagnosis Date  ? AAA (abdominal aortic aneurysm) ([place]) 2020  ? a. 2020 U/S: 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 -plan medical managmment with tight bp control; b. 09/2020 Abd U/S: 4.1cm.  ? Anxiety   ?  Aortic atherosclerosis ([place])   ? Benign essential HTN 02/10/2020  ? BPH (benign prostatic hyperplasia) 02/09/2020  ? Brain aneurysm   ? Cancer of upper lobe of left lung ([place]) 04/2020  ? a.) ENB/EBUS performed; Bx non-diagnostic. b.) presumed stage I NSCLC in the LUL; underwent SBRT (60 Gy over 5 fractions)  ? Carotid arterial disease ([place])   ? a. 05/2017 s/p R carotid endarterectomy following CVA.  ? Chronic HFimpEF (heart failure with improved ejection fraction) ([place])   ? a. 2018 Echo: EF 30-35%; b. 02/2021 Echo: EF 55-60%, nl RV fxn. Triv MR. Asc Ao 43mm.  ? CKD (chronic kidney disease), stage III ([place])   ? Claudication [facility])   ? a. 09/2020 ABIs: R 0.91. L 0.99.  ? COPD (chronic obstructive pulmonary disease) ([place])   ? Coronary artery disease   ? a. 06/2017 PCI ([place]): s/p PCI to the RCA. LAD 50, LCX 20.  ? Depression   ? 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 a. 02/2021 Asc Ao 1mm.  ? Emphysema of lung ([place])   ? History of blood transfusion   ? HLD (hyperlipidemia)   ? IDA (iron deficiency anemia) 02/09/2020  ? 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  ? Inguinal hernia   ? right  ? Ischemic cardiomyopathy   ? Long term current use of anticoagulant   ? a.) apixaban  ? Marijuana use, continuous 02/10/2020  ? Nosebleed   ? Osteoporosis   ? PAF (paroxysmal atrial fibrillation) ([place])   ? a. 05/2017 Dx in setting of CVA. CHA2DS2VASc = 6-->Eliquis.  ? Pre-diabetes   ? Pulmonary emboli ([place]) 06/07/2017  ? Right lower lobe pulmonary embolism  small segmental, multifocal multifocal pneumonia, mediastinal lymphadenopathy, moderate centrilobular emphysema  ? Seizures ([place])   ? Sigmoid diverticulosis   ? a.) CT 01/27/2021: severe  ? Stroke [facility])   ? a. 05/2017 - hospitalized in [place]->prolonged hospitalization in setting of R CEA, PE, and finding of RCA dzs on cath; b. Residual left sided weakness to arm and leg.  ? ? ? ?Past Surgical History:  ?Procedure Laterality Date  ? brain aneurysm with clip    ? COLONOSCOPY  WITH PROPOFOL N/A 07/23/2020  ? Procedure: COLONOSCOPY WITH PROPOFOL;  Surgeon: Lin Landsman, MD;  Location: [facility] ENDOSCOPY;  Service: Gastroenterology;  Laterality: N/A;  ? CORONARY ANGIOPLASTY WITH STENT PLACEMENT  07/08/2017  ? DIALYSIS/PERMA CATHETER INSERTION N/A 11/10/2021  ? Procedure: DIALYSIS/PERMA CATHETER INSERTION;  Surgeon: Katha Cabal, MD;  Location: [facility] CV LAB;  Service: Cardiovascular;  Laterality: N/A;  ? INCISION AND DRAINAGE HIP Right 10/16/2021  ? Procedure: IRRIGATION AND DEBRIDEMENT OF INFECTED TOTAL HIP;  Surgeon: Hessie Knows, MD;  Location: [facility] ORS;  Service: Orthopedics;  Laterality: Right;  ? PORTA CATH INSERTION N/A 08/31/2021  ? Procedure: PORTA CATH INSERTION;  Surgeon: Algernon Huxley, MD;  Location: [place] CV LAB;  Service: Cardiovascular;  Laterality: N/A;  ? TOTAL HIP ARTHROPLASTY Right   ? VIDEO BRONCHOSCOPY WITH ENDOBRONCHIAL NAVIGATION N/A 04/23/2020  ? Procedure: VIDEO BRONCHOSCOPY WITH ENDOBRONCHIAL NAVIGATION;  Surgeon: Tyler Pita, MD;  Location: [facility] ORS;  Service: Pulmonary;  Laterality: N/A;  ? VIDEO BRONCHOSCOPY WITH ENDOBRONCHIAL ULTRASOUND Left 08/12/2021  ? Procedure: VIDEO BRONCHOSCOPY WITH ENDOBRONCHIAL ULTRASOUND;  Surgeon: Tyler Pita, MD;  Location: [facility] ORS;  Service: Cardiopulmonary;  Laterality: Left;  ? ? ?Social History  ? ?Socioeconomic History  ? Marital status: Married  ?  Spouse name: Butch Penny  ? Number of children: Not on file  ? Years of education: Not on file  ? Highest education level: High school graduate  ?Occupational History  ? Occupation: Retired  ?Tobacco Use  ? Smoking status: Former  ?  Packs/day: 2.00  ?  Years: 53.00  ?  Pack years: 106.00  ?  Types: Cigarettes  ?  Quit date: 05/28/2017  ?  Years since quitting: 4.4  ? Smokeless tobacco: Never  ? Tobacco comments:  ?  Quit in 2018  ?Vaping Use  ? Vaping Use: Never used  ?Substance and Sexual Activity  ? Alcohol use: Not Currently  ? Drug use: Not Currently   ?  Types: Marijuana  ?  Comment: last smoke x1 month ago  ? Sexual activity: Not Currently  ?Other Topics Concern  ? Not on file  ?Social History Narrative  ? Lives with wife. 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 Children x2 children and grandchildren grown.   ? ?Social Determinants of Health  ? ?Financial Resource Strain: Low Risk   ? Difficulty of Paying Living Expenses: Not hard at all  ?Food Insecurity: No Food Insecurity  ? Worried About Charity fundraiser in the Last Year: Never true  ? Ran Out of Food in the Last Year: Never true  ?Transportation Needs: No Transportation Needs  ? Lack of Transportation (Medical): No  ? Lack of Transportation (Non-Medical): No  ?Physical Activity: Inactive  ? Days of Exercise per Week: 0 days  ? Minutes of Exercise per Session: 0 min  ?Stress: Stress Concern Present  ? Feeling of Stress : Rather much  ?Social Connections: Moderately Isolated  ? Frequency of Communication with Friends and  Family: Never  ? Frequency of Social Gatherings with Friends and Family: Never  ? Attends Religious Services: Never  ? Active Member of Clubs or Organizations: Yes  ? Attends Archivist Meetings: Never  ? Marital Status: Married  ?Intimate Partner Violence: Not At Risk  ? Fear of Current or Ex-Partner: No  ? Emotionally Abused: No  ? Physically Abused: No  ? Sexually Abused: No  ? ? ?Family History  ?Problem Relation Age of Onset  ? Diabetes Mother   ? Heart disease Mother   ? Stroke Father   ? Heart disease Father   ? Heart attack Father   ? Alcohol abuse Father   ? Heart disease Sister   ? Heart attack Sister   ? Heart disease Maternal Grandmother   ? Heart attack Maternal Grandmother   ? Heart attack Paternal Grandmother   ? Diabetes Brother   ? Heart disease Brother   ? Heart attack Brother   ? ? ? ?Current Facility-Administered Medications:  ?  acetaminophen (TYLENOL) tablet 650 mg, 650 mg, Oral, Q6H PRN **OR** acetaminophen (TYLENOL) suppository 650 mg, 650 mg,  Rectal, Q6H PRN, Schnier, Dolores Lory, MD ?  albuterol (PROVENTIL) (2.5 MG/3ML) 0.083% nebulizer solution 2.5 mg, 2.5 mg, Nebulization, Q6H PRN, Schnier, Dolores Lory, MD ?  apixaban (ELIQUIS) tablet 5 mg, 5 mg,

## 2021-11-13 NOTE — Plan of Care (Signed)
?  Problem: Education: ?Goal: Knowledge of General Education information will improve ?Description: Including pain rating scale, medication(s)/side effects and non-pharmacologic comfort measures ?Outcome: Progressing ?  ?Problem: Health Behavior/Discharge Planning: ?Goal: Ability to manage health-related needs will improve ?Outcome: Progressing ?  ?Problem: Clinical Measurements: ?Goal: Respiratory complications will improve ?Outcome: Progressing ?  ?Problem: Clinical Measurements: ?Goal: Cardiovascular complication will be avoided ?Outcome: Progressing ?  ?Problem: Activity: ?Goal: Risk for activity intolerance will decrease ?Outcome: Progressing ?  ?Problem: Nutrition: ?Goal: Adequate nutrition will be maintained ?Outcome: Progressing ?  ?Problem: Elimination: ?Goal: Will not experience complications related to bowel motility ?Outcome: Progressing ?  ?Problem: Pain Managment: ?Goal: General experience of comfort will improve ?Outcome: Progressing ?  ?Problem: Safety: ?Goal: Ability to remain free from injury will improve ?Outcome: Progressing ?  ?

## 2021-11-13 NOTE — TOC Progression Note (Signed)
Transition of Care (TOC) - Progression Note  ? ? ?Patient Details  ?Name: Richard Wiggins ?MRN: [id number] ?Date of Birth: 11-07-1950 ? ?Transition of Care (TOC) CM/SW Contact  ?[name], RN ?Phone Number: ?11/13/2021, 11:14 AM ? ?Clinical Narrative:    ? ?The patient will be on IV ABX likely until 5.18, He is open with [facility] ?Advanced onme infusion aware of the need for IV ABX ?He will go home with his wife at DC ? ?Expected Discharge Plan: [place] ?Barriers to Discharge: Continued Medical Work up ? ?Expected Discharge Plan and Services ?Expected Discharge Plan: [place] ?  ?Discharge Planning Services: CM Consult ?Post Acute Care Choice: Resumption of Svcs/PTA Provider ?Living arrangements for the past 2 months: [place] ?                ?  ?DME Agency: NA ?  ?  ?  ?HH Arranged: RN, PT, OT ?[place] Agency: [place] ([place]) ?Date HH Agency Contacted: 11/09/21 ?  ?Representative spoke with at [place]: Floydene Flock ? ? ?Social Determinants of Health (SDOH) Interventions ?  ? ?Readmission Risk Interventions ? ?  11/09/2021  ? 12:19 PM  ?Readmission Risk Prevention Plan  ?Transportation Screening Complete  ?Medication Review Press photographer) Complete  ?PCP or Specialist appointment within 3-5 days of discharge Complete  ?[place] or Home Care Consult Complete  ?SW Recovery Care/Counseling Consult Complete  ?Palliative Care Screening Not Applicable  ?Force Not Applicable  ? ? ?

## 2021-11-13 NOTE — Progress Notes (Signed)
ID ?Pt is better ?No distress at rest ?Walked with PT ?No dialysis since yesterday ? ? ? ?O/e ?Awake and alert,  ?Pale ?Patient Vitals for the past 24 hrs: ? BP Temp Temp src Pulse Resp SpO2  ?11/13/21 1719 (!) 179/83 98.1 ?F (36.7 ?C) -- 62 16 96 %  ?11/13/21 0810 (!) 189/84 97.9 ?F (36.6 ?C) -- 62 18 99 %  ?11/13/21 0349 (!) 179/96 98.4 ?F (36.9 ?C) Oral 74 16 92 %  ?11/13/21 0042 (!) 156/86 99.4 ?F (37.4 ?C) Oral 71 19 91 %  ?11/12/21 2000 (!) 176/86 98.6 ?F (37 ?C) Oral 67 18 98 %  ?11/12/21 1955 -- -- -- -- -- 91 %  ?  ?Chest b/l air entry ?Hs1s2 ?Port present ?Abd soft ?Rt hip- surgical site okay- minimal swelling- no tenderness or erythema ?CNS non focal ? ?Labs ? ?  Latest Ref Rng & Units 11/13/2021  ? 10:21 AM 11/11/2021  ?  4:16 AM 11/10/2021  ?  3:10 AM  ?CBC  ?WBC 4.0 - 10.5 K/uL 4.2   4.4   4.4    ?Hemoglobin 13.0 - 17.0 g/dL 7.5   8.2   7.5    ?Hematocrit 39.0 - 52.0 % 24.0   25.9   23.7    ?Platelets 150 - 400 K/uL 136   152   148    ?  ? ?  Latest Ref Rng & Units 11/13/2021  ? 10:21 AM 11/11/2021  ?  4:16 AM 11/10/2021  ?  3:10 AM  ?CMP  ?Glucose 70 - 99 mg/dL 125   91   109    ?BUN 8 - 23 mg/dL 19   48   70    ?Creatinine 0.61 - 1.24 mg/dL 3.61   5.44   7.00    ?Sodium 135 - 145 mmol/L 137   141   141    ?Potassium 3.5 - 5.1 mmol/L 3.1   3.4   3.4    ?Chloride 98 - 111 mmol/L 99   108   111    ?CO2 22 - 32 mmol/L '30   23   18    ' ?Calcium 8.9 - 10.3 mg/dL 8.3   8.3   7.4    ?CK8 77 ? ?Microbiology ?Blood culture from 10/29/2021 no growth ? ?Impression/recommendation ? ?AKI on CKD. ?Had dialysis for 3 days ? ?Encephalopathy secondary to AKI- resolved ? ? 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  He was sent home on IV antibiotics cefepime and daptomycin and the plan was to give him a total of 6 weeks.  Until 11/26/2021.  But now the antibiotics have been changed to ceftriaxone and daptomycin and adjusted for creatinine clearance.- after IV  antibiotic he will need pO med for 6 more weeks- Normally would have given cipro and bactrim but cannot use quinolone because of aortic aneursym. Bactrim may be CI with kidney issues. May just do doxy ? ?CK levels  were high.  Improved after DC atorvastatin ? ?Non-small cell CA lung.  Received 2 doses of chemotherapy and status post radiation ? ?History of cerebral aneurysm status post clipping ? ?Hyperlipidemia on atorvastatin which has been discontinued now ? ?Severe anemia status post PRBC underwent bone marrow biopsy -pathology okay ? ?CHF  ? ?COPD ? ?Advanced aortoiliac atherosclerotic disease. ?There is a 4.7 cm fusiform infrarenal abdominal aortic aneurysm. ? ?Discussed the management with patient and his wife ? ?ID will follow  him peripherally this weekend- call if needed ? ?  ? ?

## 2021-11-13 NOTE — Progress Notes (Addendum)
?Richard Wiggins  ?PROGRESS NOTE  ? ?Subjective:  ? ?Patient seen sitting up in bed, wife at bedside. ?Appetite slowly improving ?Denies nausea and vomiting ?Remains on room air ? ?Urine output 225 mL.  Additional 250 mL output recorded in dialysis. ? ?Objective:  ?Vital signs: ?Blood pressure (!) 189/84, pulse 62, temperature 97.9 ?F (36.6 ?C), resp. rate 18, height 5\' 10"  (1.778 m), weight 83.1 kg, SpO2 99 %. ? ?Intake/Output Summary (Last 24 hours) at 11/13/2021 1219 ?Last data filed at 11/13/2021 0349 ?Gross per 24 hour  ?Intake --  ?Output 100 ml  ?Net -100 ml  ? ? ?Filed Weights  ? 11/11/21 1342 11/12/21 0930 11/12/21 1235  ?Weight: 82.1 kg 83 kg 83.1 kg  ? ? ? ?Physical Exam: ?General:  No acute distress  ?Head:  Normocephalic, atraumatic. Dry oral mucosal membranes  ?Eyes:  Anicteric  ?Lungs:  Clear to auscultation, normal effort  ?Heart:  S1S2 no rubs  ?Abdomen:   Soft, nontender, bowel sounds present  ?Extremities: No peripheral edema.  ?Neurologic:  Awake, alert, following commands  ?Skin:  No lesions  ?Access: Left PermCath placed on 11/10/2021 by Dr. Lucky Cowboy  ? ? ?Basic Metabolic Panel: ?Recent Labs  ?Lab 11/08/21 ?0272 11/09/21 ?0505 11/10/21 ?0310 11/11/21 ?5366 11/13/21 ?1021  ?NA 139 142 141 141 137  ?K 3.7 3.9 3.4* 3.4* 3.1*  ?CL 111 115* 111 108 99  ?CO2 20* 17* 18* 23 30  ?GLUCOSE 96 101* 109* 91 125*  ?BUN 71* 72* 70* 48* 19  ?CREATININE 6.10* 6.56* 7.00* 5.44* 3.61*  ?CALCIUM 8.0* 8.0* 7.4* 8.3* 8.3*  ?PHOS  --   --   --   --  2.8  ? ? ? ?CBC: ?Recent Labs  ?Lab 11/08/21 ?4403 11/09/21 ?0505 11/10/21 ?0310 11/11/21 ?4742 11/13/21 ?1021  ?WBC 4.2 4.4 4.4 4.4 4.2  ?NEUTROABS  --  3.2  --   --   --   ?HGB 7.3* 7.9* 7.5* 8.2* 7.5*  ?HCT 23.2* 25.7* 23.7* 25.9* 24.0*  ?MCV 87.5 88.3 88.8 87.5 89.9  ?PLT 165 163 148* 152 136*  ? ? ? ? ?Urinalysis: ?No results for input(s): COLORURINE, LABSPEC, [place], GLUCOSEU, HGBUR, BILIRUBINUR, KETONESUR, PROTEINUR, UROBILINOGEN, NITRITE, LEUKOCYTESUR in the last  72 hours. ? ?Invalid input(s): APPERANCEUR ?  ? ? ?Imaging: ?No results found. ? ? ?Medications:  ? ? cefTRIAXone (ROCEPHIN)  IV 2 g (11/13/21 5956)  ? DAPTOmycin (CUBICIN)  IV 650 mg (11/11/21 1736)  ? ? apixaban  5 mg Oral BID  ? bisacodyl  5 mg Oral Daily  ? budesonide (PULMICORT) nebulizer solution  0.25 mg Nebulization BID  ? carvedilol  12.5 mg Oral BID  ? Chlorhexidine Gluconate Cloth  6 each Topical Daily  ? lidocaine  2 patch Transdermal Q24H  ? multivitamin with minerals  1 tablet Oral Daily  ? oxyCODONE  5 mg Oral Q12H  ? polyethylene glycol  17 g Oral Daily  ? tamsulosin  0.4 mg Oral Daily  ? umeclidinium-vilanterol  1 puff Inhalation Daily  ? ? ?Assessment/ Plan:  ?   ?Principal Problem: ?  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  COPD (chronic obstructive pulmonary disease) ([place]) ?  Chronic systolic CHF (congestive heart failure) ([place]) ?  Normocytic anemia ?  Septic arthritis of hip ([place]) ?  Dyslipidemia ?  Paroxysmal atrial fibrillation (H[facility]) ?  Acute metabolic encephalopathy ?  Acute lower  UTI ?  Counseling regarding advance care planning and goals of care ? ?71 y.o. male with a PMHx of hypertension, coronary artery disease, congestive heart failure, COPD, history of lung cancer now with recurrence on radiation and chemotherapy, atrial fibrillation, s/p clipping of the brain aneurysm, chronic Wiggins disease with anemia now found to have worsening renal indicis and hence a renal consultation.  Patient recently had right hip arthroplasty followed by right hip joint effusion and is being treated for sepsis.  He was initially on daptomycin and cefepime.  Presently on daptomycin every other day along with cefepime. ?Patient developed acute Wiggins injury on the top of chronic Wiggins disease.  Initially the creatinine was 1.8 subsequently went up to 6.11 and plateaued today at the same level.    ?He had a CT scan with renal stone survey which did not reveal any evidence of hydronephrosis. ?He was started on IV fluids presently with isotonic saline at 125 cc an hour. ? ? ?#1: Acute Wiggins injury with hypokalemia: Patient with acute Wiggins injury on the top of chronic Wiggins disease most likely secondary to severe prerenal azotemia due to decreased p.o. intake on the top of being on diuretics and Entresto.  There may be a component of ATN.  ? Appreciate vascular surgery placing on 11/10/2021.  Dialysis initiated at that time.   ? Will order IV potassium chloride 40 mEq  ? Patient received third dialysis treatment yesterday, tolerated well.  Plans to hold dialysis through the weekend to assess for renal recovery.  We will continue to evaluate renal function daily and assess Monday if dialysis will be needed at discharge.  Dialysis coordinator aware of patient's VA status and will monitor discharge planning. ? Would encourage strict I's and O's ?  ?#2: Sepsis: Ceftriaxone and Daptomycin continue per primary team.  ID following closely for antibiotic management. ?  ?#3: Anemia:  ?Hgb 7.5 today.  We will deferred need for blood transfusions and hematology ?  ?#4: Hypertension: Continue carvedilol at the present doses.  May add amlodipine as needed. ? BP remains elevated, 189/84 ? ?#5 Acute metabolic acidosis.  Corrected with sodium bicarb infusion. ? ? ? LOS: 8 ?[place] ?[facility] ?5/5/202312:19 PM ?  ?

## 2021-11-14 DIAGNOSIS — N179 Acute kidney failure, unspecified: Secondary | ICD-10-CM | POA: Diagnosis not present

## 2021-11-14 DIAGNOSIS — N189 Chronic kidney disease, unspecified: Secondary | ICD-10-CM | POA: Diagnosis not present

## 2021-11-14 LAB — RENAL FUNCTION PANEL
Albumin: 1.9 g/dL — ABNORMAL LOW (ref 3.5–5.0)
Anion gap: 8 (ref 5–15)
BUN: 28 mg/dL — ABNORMAL HIGH (ref 8–23)
CO2: 29 mmol/L (ref 22–32)
Calcium: 8.4 mg/dL — ABNORMAL LOW (ref 8.9–10.3)
Chloride: 100 mmol/L (ref 98–111)
Creatinine, Ser: 5.17 mg/dL — ABNORMAL HIGH (ref 0.61–1.24)
GFR, Estimated: 11 mL/min — ABNORMAL LOW (ref >60)
Glucose, Bld: 144 mg/dL — ABNORMAL HIGH (ref 70–99)
Phosphorus: 3.4 mg/dL (ref 2.5–4.6)
Potassium: 3.6 mmol/L (ref 3.5–5.1)
Sodium: 137 mmol/L (ref 135–145)

## 2021-11-14 NOTE — Progress Notes (Signed)
?[facility]  ?PROGRESS NOTE  ? ?Subjective:  ? ?Patient seen sitting up in bed, wife at bedside. ?Appetite has improved today.  Patient states he feels overall better.  Was able to eat his breakfast this morning without any problems.  States that he has been voiding regularly about 200 cc at a time.  No complaints of shortness of breath.  No leg edema. ? ? ?Objective:  ?Vital signs: ?Blood pressure (!) 111/95, pulse 67, temperature 97.9 ?F (36.6 ?C), temperature source Oral, resp. rate 18, height 5\' 10"  (1.778 m), weight 83.1 kg, SpO2 99 %. ? ?Intake/Output Summary (Last 24 hours) at 11/14/2021 1113 ?Last data filed at 11/14/2021 1041 ?Gross per 24 hour  ?Intake 120 ml  ?Output 275 ml  ?Net -155 ml  ? ? ?Filed Weights  ? 11/11/21 1342 11/12/21 0930 11/12/21 1235  ?Weight: 82.1 kg 83 kg 83.1 kg  ? ? ? ?Physical Exam: ?General:  No acute distress  ?Head:  Normocephalic, atraumatic. Dry oral mucosal membranes  ?Eyes:  Anicteric  ?Lungs:  Clear to auscultation, normal effort  ?Heart:  S1S2 no rubs  ?Abdomen:   Soft, nontender  ?Extremities: No peripheral edema.  ?Neurologic:  Awake, alert, following commands  ?Skin:  No lesions  ?Access: Left PermCath placed on 11/10/2021 by Dr. Lucky Cowboy  ? ? ?Basic Metabolic Panel: ?Recent Labs  ?Lab 11/08/21 ?2778 11/09/21 ?0505 11/10/21 ?0310 11/11/21 ?2423 11/13/21 ?1021  ?NA 139 142 141 141 137  ?K 3.7 3.9 3.4* 3.4* 3.1*  ?CL 111 115* 111 108 99  ?CO2 20* 17* 18* 23 30  ?GLUCOSE 96 101* 109* 91 125*  ?BUN 71* 72* 70* 48* 19  ?CREATININE 6.10* 6.56* 7.00* 5.44* 3.61*  ?CALCIUM 8.0* 8.0* 7.4* 8.3* 8.3*  ?PHOS  --   --   --   --  2.8  ? ? ? ?CBC: ?Recent Labs  ?Lab 11/08/21 ?5361 11/09/21 ?0505 11/10/21 ?0310 11/11/21 ?4431 11/13/21 ?1021  ?WBC 4.2 4.4 4.4 4.4 4.2  ?NEUTROABS  --  3.2  --   --   --   ?HGB 7.3* 7.9* 7.5* 8.2* 7.5*  ?HCT 23.2* 25.7* 23.7* 25.9* 24.0*  ?MCV 87.5 88.3 88.8 87.5 89.9  ?PLT 165 163 148* 152 136*  ? ? ? ? ?Urinalysis: ?No results for input(s): COLORURINE,  LABSPEC, [place], GLUCOSEU, HGBUR, BILIRUBINUR, KETONESUR, PROTEINUR, UROBILINOGEN, NITRITE, LEUKOCYTESUR in the last 72 hours. ? ?Invalid input(s): APPERANCEUR ?  ? ? ?Imaging: ?No results found. ? ? ?Medications:  ? ? cefTRIAXone (ROCEPHIN)  IV 2 g (11/14/21 0946)  ? DAPTOmycin (CUBICIN)  IV 650 mg (11/13/21 1834)  ? ? apixaban  5 mg Oral BID  ? bisacodyl  5 mg Oral Daily  ? budesonide (PULMICORT) nebulizer solution  0.25 mg Nebulization BID  ? carvedilol  12.5 mg Oral BID  ? Chlorhexidine Gluconate Cloth  6 each Topical Daily  ? lidocaine  2 patch Transdermal Q24H  ? multivitamin with minerals  1 tablet Oral Daily  ? oxyCODONE  5 mg Oral Q12H  ? polyethylene glycol  17 g Oral Daily  ? tamsulosin  0.4 mg Oral Daily  ? umeclidinium-vilanterol  1 puff Inhalation Daily  ? ? ?Assessment/ Plan:  ?   ?Principal Problem: ?  Acute kidney injury superimposed on chronic kidney disease ([place]) likely ATN/prerenal azotemia d/t poor po intake and nephrotoxic Rx  ?Active Problems: ?  COPD (chronic obstructive pulmonary disease) ([place]) ?  Chronic systolic CHF (congestive heart failure) ([place]) ?  Normocytic  anemia ?  Septic arthritis of hip ([place]) ?  Dyslipidemia ?  Paroxysmal atrial fibrillation (HCC) ?  Acute metabolic encephalopathy ?  Acute lower UTI ?  Counseling regarding advance care planning and goals of care ? ?71 y.o. male with a PMHx of hypertension, coronary artery disease, congestive heart failure, COPD, history of lung cancer now with recurrence on radiation and chemotherapy, atrial fibrillation, s/p clipping of the brain aneurysm, chronic kidney disease with anemia now found to have worsening renal indicis and hence a renal consultation.  Patient recently had right hip arthroplasty followed by right hip joint effusion and is being treated for sepsis.  He was initially on daptomycin and cefepime.  Presently on daptomycin every other day along with cefepime. ?Patient developed acute kidney injury on the top of chronic  kidney disease.  ?Baseline creatinine 1.04, GFR greater than 60 from October 19, 2021 ?Presenting creatinine of 4.63/GFR 13 from November 05, 2021 ? ? ?#1: Acute kidney injury with hypokalemia: Patient with acute kidney injury most likely secondary to severe prerenal azotemia due to decreased p.o. intake on the top of being on diuretics and Entresto.  There may be a component of ATN.  Appreciate vascular surgery placing PermCath on 11/10/2021.  Dialysis initiated at that time.   ?-Third dialysis treatment on Thursday ?-Monitor labs and urine output daily for evidence of renal recovery ?-Symptomatically patient has shown significant improvement ?-Further dialysis treatments based on labs and clinical condition ?  ?#2: Sepsis: Ceftriaxone and Daptomycin continue per primary team.  ID following closely for antibiotic management. ?  ?#3: Anemia:  ?Hgb 7.5 .  We will defer need for blood transfusions and hematology ?  ?#4: Hypertension: Continue carvedilol at the present doses.  May add amlodipine as needed. ? BP this morning is well controlled at 111/95 ? ? ? ? LOS: 9 ?Cledith Abdou ?[facility] ?5/6/202311:13 AM ?  ?

## 2021-11-14 NOTE — Progress Notes (Signed)
[place] at [facility] ? ? ?PATIENT NAME: Richard Wiggins   ? ?MR#:  [id number] ? ?DATE OF BIRTH:  [birth year] ? ?SUBJECTIVE:  ?patient resting currently. Richard Wiggins at bedside. Had episode of nausea and little dizziness ? ?VITALS:  ?Blood pressure (!) 141/66, pulse 60, temperature 97.7 ?F (36.5 ?C), temperature source Oral, resp. rate 16, height 5\' 10"  (1.778 m), weight 83.1 kg, SpO2 94 %. ? ?PHYSICAL EXAMINATION:  ? ?GENERAL:  71 y.o.-year-old patient lying in the bed with no acute distress. Deconditioned ?LUNGS: Normal breath sounds bilaterally, no wheezing, rales, rhonchi. Left chest perm cath ?CARDIOVASCULAR: S1, S2 normal. No murmurs ?ABDOMEN: Soft, nontender, nondistended. Bowel sounds present.  ?EXTREMITIES: No  edema b/l.    ?NEUROLOGIC: nonfocal  patient is alert ? ?LABORATORY PANEL:  ?CBC ?Recent Labs  ?Lab 11/13/21 ?1021  ?WBC 4.2  ?HGB 7.5*  ?HCT 24.0*  ?PLT 136*  ? ? ? ?Chemistries  ?Recent Labs  ?Lab 11/13/21 ?1021  ?NA 137  ?K 3.1*  ?CL 99  ?CO2 30  ?GLUCOSE 125*  ?BUN 19  ?CREATININE 3.61*  ?CALCIUM 8.3*  ? ? ? ?Assessment and Plan ?Richard Wiggins is a 71 year old Caucasian male, PMH COPD, HTN, HLD, PE, seizure disorder, CVA, anxiety, recent diagnosis of septic right hip status post I&D on 10/16/2021 followed with IV antibiotic therapy cefepime/daptomycin.  He presented to ED 11/05/2021 with chief complaint altered mental status and weakness.   ? ?Acute kidney injury superimposed on chronic kidney disease ([place]) likely ATN/prerenal azotemia d/t poor po intake and nephrotoxic Rx  ?---Creatinine  is increased from 4.6 on 11/05/21 to 5.1 on 11/06/21, to 6.11 on 11/07/21 --> nephrology consulted ?-Creatinine about the same on 11/08/2021, 6.10 --> worse on 05/01 to 6.5 --> 7.0 on 11/10/21  ?--Planning permacath placement and hemodialysis  ?--Avoid nephrotoxins (holding home entresto, holding diuresis unless absolutely needed, holding home eliquis) ?--5/2 pt has perm cath placement   ?--5/3-- started on hemodialysis ?--5/4-- out of bed to chair. Overall feels better. Physical therapy to be started. ?--5/5-- creatinine trending down to 3.6. Hemodialysis on hold for the weekend to monitor renal function ?--5/6-- mild nausea and dizziness ? ?Septic arthritis of hip ([place]) ?--now associated with right hip fluid collection possibly seroma less likely abscess ?--Per infectious disease recs: IV cefepime and daptomycin --> cefepime switched to ceftriaxone to avoid encephalopathy, daptomycin ordered q 48 hours  ?--orthopedic consultation with Dr Rudene Christians- some fluid accumulation is expected, no surgery planned for now given his other risk factors -- if needed consider IR intervention to aspirate the fluid/abscess  ?-- started physical therapy ?  ?Acute metabolic encephalopathy ?-- secondary to his AKI as well as septic right hip arthritis. ?--pt more lucid ?  ?Acute lower UTI ?--Likely contributing to metabolic encephalopathy and AKI ?IV cefepime --> cefrtiaxone per ID ?--follow urine culture-- less than 10,000 and significant growth ?   ?Paroxysmal atrial fibrillation (HCC) ?--Held anticoagulation for now given anemia / bleed risk, may restart w/ heparin vs home eliquis after cath placement / if Hgb stays up ?--5/3--hgb 8.2--resume eliquis ?  ?Dyslipidemia ?--Continue statin therapy at reduced dose to avoid hepatotoxicity w/ daptomycin ?  ?Normocytic anemia ?--Recent surgery, some anemia expected  ?--Hgb improved post PRBC ?--s/p BM biopsy 11/09/21, pathology pending, HemOnc following  ?  ?Chronic systolic CHF (congestive heart failure) ([place]) ?--Not in exacerbation as of admission, however needs fluids to treat AKI (Cr is increased from 4.6 yesterday 11/05/21 to 5.1 11/06/21,  6.1 on  11/07/21) ?--Last Echo on file 02/2021 EF 55-60% ?--Holding entresto and lasix for now given AKI ?  ?COPD (chronic obstructive pulmonary disease) ([place]) ?Prn inhalers ?  ? ?Procedures:perm cath placement, Bone marrow  biospy ?Family communication Richard Wiggins at bedside ?Consults : G.I., orthopedic, ID, hematology ?CODE STATUS: DNR ?DVT Prophylaxis : eliquis ?Level of care: Med-Surg ?Status is: Inpatient ?Remains inpatient appropriate because: multiple medical issues with ongoing IV antibiotics and starting new dialysis ? ?physical therapy consulted. TOC for discharge planning ?monitoring renal function over the weekend. Anticipate discharge early next week. ? ?TOTAL TIME TAKING CARE OF THIS PATIENT: 30 minutes.  ?>50% time spent on counselling and coordination of care ? ?Note: This dictation was prepared with Dragon dictation along with smaller phrase technology. Any transcriptional errors that result from this process are unintentional. ? ?Fritzi Mandes M.D  ? ? ?[facility]  ? ?CC: ?Primary care physician; [facility], [facility]  ?

## 2021-11-14 NOTE — Progress Notes (Signed)
Pt in BR to have bowl movement; then was standing at sink too long to wash up. Pt got tired and weak legged. Assisted pt to sit on bsc pt resisted a few minutes then slowly made it back to bed. ?

## 2021-11-15 DIAGNOSIS — N179 Acute kidney failure, unspecified: Secondary | ICD-10-CM | POA: Diagnosis not present

## 2021-11-15 DIAGNOSIS — N189 Chronic kidney disease, unspecified: Secondary | ICD-10-CM | POA: Diagnosis not present

## 2021-11-15 LAB — RENAL FUNCTION PANEL
Albumin: 1.9 g/dL — ABNORMAL LOW (ref 3.5–5.0)
Anion gap: 9 (ref 5–15)
BUN: 30 mg/dL — ABNORMAL HIGH (ref 8–23)
CO2: 28 mmol/L (ref 22–32)
Calcium: 8.4 mg/dL — ABNORMAL LOW (ref 8.9–10.3)
Chloride: 101 mmol/L (ref 98–111)
Creatinine, Ser: 5.87 mg/dL — ABNORMAL HIGH (ref 0.61–1.24)
GFR, Estimated: 10 mL/min — ABNORMAL LOW (ref >60)
Glucose, Bld: 90 mg/dL (ref 70–99)
Phosphorus: 4.1 mg/dL (ref 2.5–4.6)
Potassium: 3.6 mmol/L (ref 3.5–5.1)
Sodium: 138 mmol/L (ref 135–145)

## 2021-11-15 LAB — MAGNESIUM: Magnesium: 2.2 mg/dL (ref 1.7–2.4)

## 2021-11-15 MED ORDER — AMLODIPINE BESYLATE 5 MG PO TABS
5.0000 mg | ORAL_TABLET | Freq: Every evening | ORAL | Status: DC
  Administered 2021-11-15 – 2021-11-18 (×4): 5 mg via ORAL
  Filled 2021-11-15 (×4): qty 1

## 2021-11-15 MED ORDER — SODIUM CHLORIDE 0.9% FLUSH: 10.0000 mL | INTRAVENOUS | Status: DC | PRN

## 2021-11-15 NOTE — Plan of Care (Signed)
?  Problem: Education: ?Goal: Knowledge of General Education information will improve ?Description: Including pain rating scale, medication(s)/side effects and non-pharmacologic comfort measures ?Outcome: Progressing ?  ?Problem: Health Behavior/Discharge Planning: ?Goal: Ability to manage health-related needs will improve ?Outcome: Progressing ?  ?Problem: Clinical Measurements: ?Goal: Ability to maintain clinical measurements within normal limits will improve ?Outcome: Progressing ?Goal: Will remain free from infection ?Outcome: Progressing ?Goal: Diagnostic test results will improve ?Outcome: Progressing ?Goal: Respiratory complications will improve ?Outcome: Progressing ?Goal: Cardiovascular complication will be avoided ?Outcome: Progressing ?  ?Problem: Activity: ?Goal: Risk for activity intolerance will decrease ?Outcome: Progressing ?  ?Problem: Nutrition: ?Goal: Adequate nutrition will be maintained ?Outcome: Progressing ?  ?Problem: Elimination: ?Goal: Will not experience complications related to bowel motility ?Outcome: Progressing ?Goal: Will not experience complications related to urinary retention ?Outcome: Progressing ?  ?Problem: Elimination: ?Goal: Will not experience complications related to bowel motility ?Outcome: Progressing ?Goal: Will not experience complications related to urinary retention ?Outcome: Progressing ?  ?Problem: Pain Managment: ?Goal: General experience of comfort will improve ?Outcome: Progressing ?  ?Problem: Skin Integrity: ?Goal: Risk for impaired skin integrity will decrease ?Outcome: Progressing ?  ?

## 2021-11-15 NOTE — Progress Notes (Signed)
[place] [facility] ? ? ?PATIENT NAME: Richard Wiggins   ? ?MR#:  [id number] ? ?DATE OF BIRTH:  11/29/1950 ? ?SUBJECTIVE:  ?patient resting currently. Wife at bedside.  ?Feels a lot better. Patient tells me his appetite has improved. ?VITALS:  ?Blood pressure (!) 179/85, pulse 64, temperature 97.9 ?F (36.6 ?C), resp. rate 14, height 5\' 10"  (1.778 m), weight 83.1 kg, SpO2 94 %. ? ?PHYSICAL EXAMINATION:  ? ?GENERAL:  71 y.o.-year-old patient lying in the bed with no acute distress. Deconditioned ?LUNGS: Normal breath sounds bilaterally, no wheezing, rales, rhonchi. Left chest perm cath ?CARDIOVASCULAR: S1, S2 normal. No murmurs ?ABDOMEN: Soft, nontender, nondistended. Bowel sounds present.  ?EXTREMITIES: No  edema b/l.    ?NEUROLOGIC: nonfocal  patient is alert ? ?LABORATORY PANEL:  ?CBC ?Recent Labs  ?Lab 11/13/21 ?1021  ?WBC 4.2  ?HGB 7.5*  ?HCT 24.0*  ?PLT 136*  ? ? ? ?Chemistries  ?Recent Labs  ?Lab 11/15/21 ?6967  ?NA 138  ?K 3.6  ?CL 101  ?CO2 28  ?GLUCOSE 90  ?BUN 30*  ?CREATININE 5.87*  ?CALCIUM 8.4*  ?MG 2.2  ? ? ? ?Assessment and Plan ?[name] is a 71 year old Caucasian male, PMH COPD, HTN, HLD, PE, seizure disorder, CVA, anxiety, recent diagnosis of septic right hip status post I&D on 10/16/2021 followed with IV antibiotic therapy cefepime/daptomycin.  He presented to ED 11/05/2021 with chief complaint altered mental status and weakness.   ? ?Acute kidney injury superimposed on chronic kidney disease ([place]) likely ATN/prerenal azotemia d/t poor po intake and nephrotoxic Rx  ?---Creatinine  is increased from 4.6 on 11/05/21 to 5.1 on 11/06/21, to 6.11 on 11/07/21 --> nephrology consulted ?-Creatinine about the same on 11/08/2021, 6.10 --> worse on 05/01 to 6.5 --> 7.0 on 11/10/21  ?--Planning permacath placement and hemodialysis  ?--Avoid nephrotoxins (holding home entresto, holding diuresis unless absolutely needed, holding home eliquis) ?--5/2 pt has perm cath placement   ?--5/3-- started on hemodialysis ?--5/4-- out of bed to chair. Overall feels better. Physical therapy to be started. ?--5/5-- creatinine trending down to 3.6. Hemodialysis on hold for the weekend to monitor renal function ?--5/6-- mild nausea and dizziness ?--5/7--looking and feeling better. Creat upto 5.17--D/w [name]. Will be initiated with HD from tomorrow. He will have outpatient chair time. ? ?Septic arthritis of hip ([place]) ?--now associated with right hip fluid collection possibly seroma less likely abscess ?--Per infectious disease recs: IV cefepime and daptomycin --> cefepime switched to ceftriaxone to avoid encephalopathy, daptomycin ordered q 48 hours  ?--orthopedic consultation with Dr Rudene Christians- some fluid accumulation is expected, no surgery planned for now given his other risk factors -- if needed consider IR intervention to aspirate the fluid/abscess  ?-- started physical therapy ?  ?Acute metabolic encephalopathy ?-- secondary to his AKI as well as septic right hip arthritis. ?--pt more lucid ?  ?Acute lower UTI ?--Likely contributing to metabolic encephalopathy and AKI ?IV cefepime --> cefrtiaxone per ID ?--follow urine culture-- less than 10,000 and significant growth ?   ?Paroxysmal atrial fibrillation (HCC) ?--Held anticoagulation for now given anemia / bleed risk, may restart w/ heparin vs home eliquis after cath placement / if Hgb stays up ?--5/3--hgb 8.2--resume eliquis ?  ?Dyslipidemia ?--Continue statin therapy at reduced dose to avoid hepatotoxicity w/ daptomycin ?  ?Normocytic anemia ?--Recent surgery, some anemia expected  ?--Hgb improved post PRBC ?--s/p BM biopsy 11/09/21, pathology pending, HemOnc following  ?  ?Chronic systolic CHF (congestive heart failure) (  [place]) ?--Not in exacerbation as of admission, however needs fluids to treat AKI (Cr is increased from 4.6 yesterday 11/05/21 to 5.1 11/06/21,  6.1 on 11/07/21) ?--Last Echo on file 02/2021 EF 55-60% ?--Holding entresto and lasix for  now given AKI ?  ?COPD (chronic obstructive pulmonary disease) ([place]) ?Prn inhalers ?  ? ?Procedures:perm cath placement, Bone marrow biospy ?Family communication wife at bedside ?Consults : G.I., orthopedic, ID, hematology ?CODE STATUS: DNR ?DVT Prophylaxis : eliquis ?Level of care: Med-Surg ?Status is: Inpatient ?Remains inpatient appropriate because: multiple medical issues with ongoing IV antibiotics and starting new dialysis ? ?physical therapy consulted. TOC for discharge planning ?monitoring renal function over the weekend.  ?Creatinine trending up. Patient will need to be resumed on at hemodialysis from tomorrow. He will need to have outpatient chair time prior to discharge. Discussed with Dr. Candiss Norse and patient with wife ? ?TOTAL TIME TAKING CARE OF THIS PATIENT: 30 minutes.  ?>50% time spent on counselling and coordination of care ? ?Note: This dictation was prepared with Dragon dictation along with smaller phrase technology. Any transcriptional errors that result from this process are unintentional. ? ?Fritzi Mandes M.D  ? ? ?[facility]  ? ?CC: ?Primary care physician; [facility], [facility]  ?

## 2021-11-15 NOTE — Progress Notes (Signed)
?[facility]  ?PROGRESS NOTE  ? ?Subjective:  ? ?Patient seen sitting up in bed, wife at bedside. ?Appetite has improved today.  Patient states he feels overall better.  Was able to eat his breakfast this morning without any problems.  States that he has been voiding regularly about 200 cc at a time.  No complaints of shortness of breath.  No leg edema. ?Unfortunately, serum creatinine continues to remain high and is increased compared to yesterday. ? ? ?Objective:  ?Vital signs: ?Blood pressure (!) 179/85, pulse 64, temperature 97.9 ?F (36.6 ?C), resp. rate 14, height 5\' 10"  (1.778 m), weight 83.1 kg, SpO2 94 %. ? ?Intake/Output Summary (Last 24 hours) at 11/15/2021 0926 ?Last data filed at 11/14/2021 1041 ?Gross per 24 hour  ?Intake 120 ml  ?Output --  ?Net 120 ml  ? ? ?Filed Weights  ? 11/11/21 1342 11/12/21 0930 11/12/21 1235  ?Weight: 82.1 kg 83 kg 83.1 kg  ? ? ? ?Physical Exam: ?General:  No acute distress  ?Head:  Normocephalic, atraumatic. Dry oral mucosal membranes  ?Eyes:  Anicteric  ?Lungs:  Clear to auscultation, normal effort  ?Heart:  S1S2 no rubs  ?Abdomen:   Soft, nontender  ?Extremities: No peripheral edema.  ?Neurologic:  Awake, alert, following commands  ?Skin:  No lesions  ?Access: Left PermCath placed on 11/10/2021 by Dr. Lucky Cowboy  ? ? ?Basic Metabolic Panel: ?Recent Labs  ?Lab 11/10/21 ?0310 11/11/21 ?1478 11/13/21 ?1021 11/14/21 ?1442 11/15/21 ?2956  ?NA 141 141 137 137 138  ?K 3.4* 3.4* 3.1* 3.6 3.6  ?CL 111 108 99 100 101  ?CO2 18* 23 30 29 28   ?GLUCOSE 109* 91 125* 144* 90  ?BUN 70* 48* 19 28* 30*  ?CREATININE 7.00* 5.44* 3.61* 5.17* 5.87*  ?CALCIUM 7.4* 8.3* 8.3* 8.4* 8.4*  ?MG  --   --   --   --  2.2  ?PHOS  --   --  2.8 3.4 4.1  ? ? ? ?CBC: ?Recent Labs  ?Lab 11/09/21 ?0505 11/10/21 ?0310 11/11/21 ?2130 11/13/21 ?1021  ?WBC 4.4 4.4 4.4 4.2  ?NEUTROABS 3.2  --   --   --   ?HGB 7.9* 7.5* 8.2* 7.5*  ?HCT 25.7* 23.7* 25.9* 24.0*  ?MCV 88.3 88.8 87.5 89.9  ?PLT 163 148* 152 136*   ? ? ? ? ?Urinalysis: ?No results for input(s): COLORURINE, LABSPEC, [place], GLUCOSEU, HGBUR, BILIRUBINUR, KETONESUR, PROTEINUR, UROBILINOGEN, NITRITE, LEUKOCYTESUR in the last 72 hours. ? ?Invalid input(s): APPERANCEUR ?  ? ? ?Imaging: ?No results found. ? ? ?Medications:  ? ? cefTRIAXone (ROCEPHIN)  IV Stopped (11/14/21 1041)  ? DAPTOmycin (CUBICIN)  IV 650 mg (11/13/21 1834)  ? ? apixaban  5 mg Oral BID  ? bisacodyl  5 mg Oral Daily  ? budesonide (PULMICORT) nebulizer solution  0.25 mg Nebulization BID  ? carvedilol  12.5 mg Oral BID  ? Chlorhexidine Gluconate Cloth  6 each Topical Daily  ? lidocaine  2 patch Transdermal Q24H  ? multivitamin with minerals  1 tablet Oral Daily  ? oxyCODONE  5 mg Oral Q12H  ? polyethylene glycol  17 g Oral Daily  ? tamsulosin  0.4 mg Oral Daily  ? umeclidinium-vilanterol  1 puff Inhalation Daily  ? ? ?Assessment/ Plan:  ?   ?Principal Problem: ?  Acute kidney injury superimposed on chronic kidney disease ([place]) likely ATN/prerenal azotemia d/t poor po intake and nephrotoxic Rx  ?Active Problems: ?  COPD (chronic obstructive pulmonary disease) ([place]) ?  Chronic  systolic CHF (congestive heart failure) ([place]) ?  Normocytic anemia ?  Septic arthritis of hip ([place]) ?  Dyslipidemia ?  Paroxysmal atrial fibrillation (HCC) ?  Acute metabolic encephalopathy ?  Acute lower UTI ?  Counseling regarding advance care planning and goals of care ? ?71 y.o. male with a PMHx of hypertension, coronary artery disease, congestive heart failure, COPD, history of lung cancer now with recurrence on radiation and chemotherapy, atrial fibrillation, s/p clipping of the brain aneurysm, chronic kidney disease with anemia now found to have worsening renal indicis and hence a renal consultation.  Patient recently had right hip arthroplasty followed by right hip joint effusion and is being treated for sepsis.  ?Patient developed acute kidney injury on the top of chronic kidney disease.  ?Baseline creatinine 1.04,  GFR greater than 60 from October 19, 2021 ?Presenting creatinine of 4.63/GFR 13 from November 05, 2021 ? ? ?#1: Acute kidney injury with hypokalemia: Patient with acute kidney injury most likely secondary to severe prerenal azotemia due to decreased p.o. intake on the top of being on diuretics and Entresto.  There may be a component of ATN.  Appreciate vascular surgery placing PermCath on 11/10/2021.  Dialysis initiated at that time.   ?-Third dialysis treatment on Thursday ?-Monitor labs and urine output daily for evidence of renal recovery ?-Symptomatically patient has shown significant improvement ?-Next dialysis planned for Monday ?-Further dialysis treatments based on labs and clinical condition ?-Start outpatient discharge planning-[facility] ?  ?#2: Sepsis: Ceftriaxone and Daptomycin continue per primary team.  ID following closely for antibiotic management.  Will need antibiotics set up as outpatient during dialysis or at home because dialysis schedule might be undetermined. ?  ?#3: Anemia, multifactorial:  ?Hgb 7.5 .  We will defer need for blood transfusions/EPO to hematology ?  ?#4: Hypertension:  ?BP still elevated ?Continue carvedilol at the present doses.  Will add amlodipine ?   ? ? ? ? LOS: 10 ?Richard Wiggins ?[facility] ?5/7/20239:26 AM ?  ?

## 2021-11-16 ENCOUNTER — Inpatient Hospital Stay: Payer: No Typology Code available for payment source

## 2021-11-16 DIAGNOSIS — N179 Acute kidney failure, unspecified: Secondary | ICD-10-CM | POA: Diagnosis not present

## 2021-11-16 DIAGNOSIS — N189 Chronic kidney disease, unspecified: Secondary | ICD-10-CM | POA: Diagnosis not present

## 2021-11-16 LAB — HEPATITIS B CORE ANTIBODY, TOTAL: Hep B Core Total Ab: NONREACTIVE

## 2021-11-16 LAB — CK: Total CK: 112 U/L (ref 49–397)

## 2021-11-16 MED ORDER — LIDOCAINE HCL (PF) 1 % IJ SOLN
5.0000 mL | INTRAMUSCULAR | Status: DC | PRN
  Filled 2021-11-16: qty 5

## 2021-11-16 MED ORDER — PENTAFLUOROPROP-TETRAFLUOROETH EX AERO: 1.0000 "application " | INHALATION_SPRAY | CUTANEOUS | Status: DC | PRN

## 2021-11-16 MED ORDER — SODIUM CHLORIDE 0.9 % IV SOLN: 100.0000 mL | INTRAVENOUS | Status: DC | PRN

## 2021-11-16 MED ORDER — BUDESONIDE 0.25 MG/2ML IN SUSP: 0.2500 mg | Freq: Two times a day (BID) | RESPIRATORY_TRACT | Status: DC | PRN

## 2021-11-16 MED ORDER — ALTEPLASE 2 MG IJ SOLR: 2.0000 mg | Freq: Once | INTRAMUSCULAR | Status: DC | PRN

## 2021-11-16 MED ORDER — HEPARIN SODIUM (PORCINE) 1000 UNIT/ML DIALYSIS: 1000.0000 [IU] | INTRAMUSCULAR | Status: DC | PRN

## 2021-11-16 MED ORDER — HEPARIN SODIUM (PORCINE) 1000 UNIT/ML IJ SOLN
INTRAMUSCULAR | Status: AC
  Filled 2021-11-16: qty 10

## 2021-11-16 MED ORDER — LIDOCAINE-PRILOCAINE 2.5-2.5 % EX CREA: 1.0000 "application " | TOPICAL_CREAM | CUTANEOUS | Status: DC | PRN

## 2021-11-16 NOTE — Progress Notes (Signed)
?   11/12/21 1115 11/12/21 1130 11/12/21 1145  ?Vitals  ?Temp  --   --   --   ?Temp Source  --   --   --   ?BP (!) 158/76 (!) 161/77 (!) 158/96  ?MAP (mmHg) 100 102 115  ?BP Location  --   --   --   ?BP Method  --   --   --   ?Patient Position (if appropriate)  --   --   --   ?Pulse Rate 70 65 69  ?Pulse Rate Source  --   --   --   ?ECG Heart Rate 71 66 69  ?Resp 13 13 (!) 0  ?During Hemodialysis Assessment  ?Blood Flow Rate (mL/min) 300 mL/min 300 mL/min 300 mL/min  ?Arterial Pressure (mmHg) -130 mmHg -130 mmHg -130 mmHg  ?Venous Pressure (mmHg) 80 mmHg 80 mmHg 80 mmHg  ?Transmembrane Pressure (mmHg) 50 mmHg 50 mmHg 50 mmHg  ?Ultrafiltration Rate (mL/min) 170 mL/min 170 mL/min 170 mL/min  ?Dialysate Flow Rate (mL/min) 500 ml/min 500 ml/min 500 ml/min  ?Conductivity: Machine  13.8 13.8 13.8  ?HD Safety Checks Performed Yes Yes Yes  ?Intra-Hemodialysis Comments Progressing as prescribed Progressing as prescribed Progressing as prescribed  ? ? 11/12/21 1200 11/12/21 1215 11/12/21 1235  ?Vitals  ?Temp  --   --  97.9 ?F (36.6 ?C)  ?Temp Source  --   --  Oral  ?BP (!) 175/85 (!) 177/83 (!) 172/86  ?MAP (mmHg) 110 110 111  ?BP Location  --   --  Left Arm  ?BP Method  --   --  Automatic  ?Patient Position (if appropriate)  --   --  Lying  ?Pulse Rate  --   --  68  ?Pulse Rate Source  --   --  Monitor  ?ECG Heart Rate 72 72 70  ?Resp (!) 22 20 19   ?During Hemodialysis Assessment  ?Blood Flow Rate (mL/min) 300 mL/min 300 mL/min  --   ?Arterial Pressure (mmHg) -130 mmHg -130 mmHg  --   ?Venous Pressure (mmHg) 80 mmHg 80 mmHg  --   ?Transmembrane Pressure (mmHg) 50 mmHg 50 mmHg  --   ?Ultrafiltration Rate (mL/min) 170 mL/min 170 mL/min  --   ?Dialysate Flow Rate (mL/min) 500 ml/min 500 ml/min  --   ?Conductivity: Machine  13.8 13.8  --   ?HD Safety Checks Performed Yes Yes  --   ?Intra-Hemodialysis Comments Progressing as prescribed Progressing as prescribed Tolerated well;Tx completed  ? ?

## 2021-11-16 NOTE — Progress Notes (Signed)
?   11/11/21 1108 11/11/21 1115 11/11/21 1130  ?Vitals  ?BP  --  (!) 165/92 (!) 154/85  ?MAP (mmHg)  --  112 105  ?Pulse Rate 66 67 66  ?ECG Heart Rate 67 66 67  ?Resp (!) 21 15 16   ?During Hemodialysis Assessment  ?Blood Flow Rate (mL/min) 250 mL/min 250 mL/min 250 mL/min  ?Arterial Pressure (mmHg) -80 mmHg -80 mmHg -80 mmHg  ?Venous Pressure (mmHg) 70 mmHg 70 mmHg 70 mmHg  ?Transmembrane Pressure (mmHg) 40 mmHg 40 mmHg 40 mmHg  ?Ultrafiltration Rate (mL/min) 200 mL/min 200 mL/min 200 mL/min  ?Dialysate Flow Rate (mL/min) 300 ml/min 300 ml/min 300 ml/min  ?Conductivity: Machine  13.6 13.6 13.6  ?HD Safety Checks Performed Yes Yes Yes  ?Dialysis Fluid Bolus Normal Saline  --   --   ?Bolus Amount (mL) 250 mL  --   --   ?Dialysate Change Other (comment) ?(3K)  --   --   ?Intra-Hemodialysis Comments Tx initiated Progressing as prescribed Progressing as prescribed  ? ? 11/11/21 1145 11/11/21 1200 11/11/21 1215  ?Vitals  ?BP (!) 164/76 (!) 157/85 (!) 160/79  ?MAP (mmHg) 101 107 103  ?Pulse Rate 63 60 (!) 59  ?ECG Heart Rate 63 61 61  ?Resp 14 14 12   ?During Hemodialysis Assessment  ?Blood Flow Rate (mL/min) 250 mL/min 250 mL/min 250 mL/min  ?Arterial Pressure (mmHg) -80 mmHg -80 mmHg -80 mmHg  ?Venous Pressure (mmHg) 70 mmHg 70 mmHg 70 mmHg  ?Transmembrane Pressure (mmHg) 40 mmHg 40 mmHg 40 mmHg  ?Ultrafiltration Rate (mL/min) 200 mL/min 200 mL/min 200 mL/min  ?Dialysate Flow Rate (mL/min) 300 ml/min 300 ml/min 300 ml/min  ?Conductivity: Machine  13.8 13.8 13.8  ?HD Safety Checks Performed Yes Yes Yes  ?Dialysis Fluid Bolus  --   --   --   ?Bolus Amount (mL)  --   --   --   ?Dialysate Change  --   --   --   ?Intra-Hemodialysis Comments Progressing as prescribed Progressing as prescribed Progressing as prescribed  ? ?

## 2021-11-16 NOTE — Progress Notes (Signed)
?   11/12/21 0930 11/12/21 0945 11/12/21 1000  ?Vitals  ?Temp 98.2 ?F (36.8 ?C)  --   --   ?Temp Source Oral  --   --   ?BP (!) 167/80 (!) 160/77 (!) 159/82  ?MAP (mmHg) 105 102 104  ?BP Location Left Arm  --   --   ?BP Method Automatic  --   --   ?Patient Position (if appropriate) Lying  --   --   ?Pulse Rate 71 67 65  ?Pulse Rate Source Monitor  --   --   ?ECG Heart Rate 74 68 66  ?Resp (!) 29 15 15   ?During Hemodialysis Assessment  ?Blood Flow Rate (mL/min) 300 mL/min 300 mL/min 300 mL/min  ?Arterial Pressure (mmHg) -120 mmHg -130 mmHg -130 mmHg  ?Venous Pressure (mmHg) 80 mmHg 80 mmHg 80 mmHg  ?Transmembrane Pressure (mmHg) 50 mmHg 50 mmHg 50 mmHg  ?Ultrafiltration Rate (mL/min) 170 mL/min 170 mL/min 170 mL/min  ?Dialysate Flow Rate (mL/min) 500 ml/min 500 ml/min 500 ml/min  ?Conductivity: Machine  13.8 13.8 13.8  ?HD Safety Checks Performed Yes Yes Yes  ?Dialysis Fluid Bolus Normal Saline  --   --   ?Bolus Amount (mL) 250 mL  --   --   ?Intra-Hemodialysis Comments Tx initiated Progressing as prescribed Progressing as prescribed  ? ? 11/12/21 1015 11/12/21 1030 11/12/21 1045  ?Vitals  ?Temp  --   --   --   ?Temp Source  --   --   --   ?BP 136/74 137/74 128/68  ?MAP (mmHg) 94 91 85  ?BP Location  --   --   --   ?BP Method  --   --   --   ?Patient Position (if appropriate)  --   --   --   ?Pulse Rate 63 63 63  ?Pulse Rate Source  --   --   --   ?ECG Heart Rate 65 64 64  ?Resp 14 14 13   ?During Hemodialysis Assessment  ?Blood Flow Rate (mL/min) 300 mL/min 300 mL/min 300 mL/min  ?Arterial Pressure (mmHg) -130 mmHg -130 mmHg -130 mmHg  ?Venous Pressure (mmHg) 80 mmHg 80 mmHg 80 mmHg  ?Transmembrane Pressure (mmHg) 50 mmHg 40 mmHg 50 mmHg  ?Ultrafiltration Rate (mL/min) 170 mL/min 170 mL/min 170 mL/min  ?Dialysate Flow Rate (mL/min) 500 ml/min 500 ml/min 500 ml/min  ?Conductivity: Machine  13.8 13.8 13.8  ?HD Safety Checks Performed Yes Yes Yes  ?Dialysis Fluid Bolus  --   --   --   ?Bolus Amount (mL)  --   --   --    ?Intra-Hemodialysis Comments Progressing as prescribed Progressing as prescribed Progressing as prescribed  ? ? 11/12/21 1100  ?Vitals  ?Temp  --   ?Temp Source  --   ?BP 122/69  ?MAP (mmHg) 85  ?BP Location  --   ?BP Method  --   ?Patient Position (if appropriate)  --   ?Pulse Rate 63  ?Pulse Rate Source  --   ?ECG Heart Rate 67  ?Resp 14  ?During Hemodialysis Assessment  ?Blood Flow Rate (mL/min) 300 mL/min  ?Arterial Pressure (mmHg) -130 mmHg  ?Venous Pressure (mmHg) 80 mmHg  ?Transmembrane Pressure (mmHg) 50 mmHg  ?Ultrafiltration Rate (mL/min) 170 mL/min  ?Dialysate Flow Rate (mL/min) 500 ml/min  ?Conductivity: Machine  13.8  ?HD Safety Checks Performed Yes  ?Dialysis Fluid Bolus  --   ?Bolus Amount (mL)  --   ?Intra-Hemodialysis Comments Progressing as prescribed  ? ?

## 2021-11-16 NOTE — Progress Notes (Signed)
?   11/11/21 1230 11/11/21 1245 11/11/21 1300  ?Vitals  ?Temp  --   --   --   ?Temp Source  --   --   --   ?BP 126/75 129/74 (!) 141/79  ?MAP (mmHg) 89 91 100  ?Pulse Rate 77 72 84  ?ECG Heart Rate 86 72 81  ?Resp 20 (!) 22 (!) 32  ?During Hemodialysis Assessment  ?Blood Flow Rate (mL/min) 250 mL/min 250 mL/min 250 mL/min  ?Arterial Pressure (mmHg) -80 mmHg -100 mmHg -100 mmHg  ?Venous Pressure (mmHg) 70 mmHg 80 mmHg 80 mmHg  ?Transmembrane Pressure (mmHg) 40 mmHg 40 mmHg 40 mmHg  ?Ultrafiltration Rate (mL/min) 200 mL/min 200 mL/min 200 mL/min  ?Dialysate Flow Rate (mL/min) 300 ml/min 300 ml/min 300 ml/min  ?Conductivity: Machine  13.8 13.8 13.8  ?HD Safety Checks Performed Yes Yes Yes  ?Intra-Hemodialysis Comments Progressing as prescribed Progressing as prescribed Progressing as prescribed  ? ? 11/11/21 1315 11/11/21 1330 11/11/21 1342  ?Vitals  ?Temp  --   --  97.8 ?F (36.6 ?C)  ?Temp Source  --   --  Oral  ?BP (!) 170/88 (!) 165/86 (!) 165/86  ?MAP (mmHg) 107 108 108  ?Pulse Rate 73 76 65  ?ECG Heart Rate 70 74 75  ?Resp 19 20 18   ?During Hemodialysis Assessment  ?Blood Flow Rate (mL/min) 250 mL/min 250 mL/min  --   ?Arterial Pressure (mmHg) -110 mmHg -100 mmHg  --   ?Venous Pressure (mmHg) 70 mmHg 80 mmHg  --   ?Transmembrane Pressure (mmHg) 50 mmHg 40 mmHg  --   ?Ultrafiltration Rate (mL/min) 200 mL/min 210 mL/min  --   ?Dialysate Flow Rate (mL/min) 300 ml/min 300 ml/min  --   ?Conductivity: Machine  13.7 13.7  --   ?HD Safety Checks Performed Yes Yes  --   ?Intra-Hemodialysis Comments Progressing as prescribed Progressing as prescribed Tx completed  ? ?

## 2021-11-16 NOTE — Progress Notes (Signed)
?[facility]  ?PROGRESS NOTE  ? ?Subjective:  ? ? ?Patient creatinine was trending up so patient is now on renal placement therapy ?Patient was seen today on dialysis ?Patient tolerating treatment well ? ? ?Objective:  ?Vital signs: ?Blood pressure (!) 151/73, pulse 68, temperature 98 ?F (36.7 ?C), resp. rate 17, height 5\' 10"  (1.778 m), weight 82.9 kg, SpO2 92 %. ? ?Intake/Output Summary (Last 24 hours) at 11/16/2021 1707 ?Last data filed at 11/16/2021 1602 ?Gross per 24 hour  ?Intake 885 ml  ?Output 400 ml  ?Net 485 ml  ? ?Filed Weights  ? 11/12/21 1235 11/16/21 0907 11/16/21 1153  ?Weight: 83.1 kg 81.9 kg 82.9 kg  ? ? ? ?Physical Exam: ?General:  No acute distress  ?Head:  Normocephalic, atraumatic. Dry oral mucosal membranes  ?Eyes:  Anicteric  ?Lungs:  Clear to auscultation, normal effort  ?Heart:  S1S2 no rubs  ?Abdomen:   Soft, nontender  ?Extremities: No peripheral edema.  ?Neurologic:  Awake, alert, following commands  ?Skin:  No lesions  ?Access: Left PermCath placed on 11/10/2021 by Dr. Lucky Cowboy  ? ? ?Basic Metabolic Panel: ?Recent Labs  ?Lab 11/10/21 ?0310 11/11/21 ?3546 11/13/21 ?1021 11/14/21 ?1442 11/15/21 ?5681  ?NA 141 141 137 137 138  ?K 3.4* 3.4* 3.1* 3.6 3.6  ?CL 111 108 99 100 101  ?CO2 18* 23 30 29 28   ?GLUCOSE 109* 91 125* 144* 90  ?BUN 70* 48* 19 28* 30*  ?CREATININE 7.00* 5.44* 3.61* 5.17* 5.87*  ?CALCIUM 7.4* 8.3* 8.3* 8.4* 8.4*  ?MG  --   --   --   --  2.2  ?PHOS  --   --  2.8 3.4 4.1  ? ? ?CBC: ?Recent Labs  ?Lab 11/10/21 ?0310 11/11/21 ?2751 11/13/21 ?1021  ?WBC 4.4 4.4 4.2  ?HGB 7.5* 8.2* 7.5*  ?HCT 23.7* 25.9* 24.0*  ?MCV 88.8 87.5 89.9  ?PLT 148* 152 136*  ? ? ? ?Urinalysis: ?No results for input(s): COLORURINE, LABSPEC, [place], GLUCOSEU, HGBUR, BILIRUBINUR, KETONESUR, PROTEINUR, UROBILINOGEN, NITRITE, LEUKOCYTESUR in the last 72 hours. ? ?Invalid input(s): APPERANCEUR ?  ? ? ?Imaging: ?DG Chest 1 View ? ?Result Date: 11/16/2021 ?CLINICAL DATA:  Inpatient encounter for AKI 700174  EXAM: CHEST  1 VIEW COMPARISON:  Radiograph 10/21/2021 FINDINGS: Unchanged chest port catheter with tip overlying the mid SVC. There is a left approach catheter with 1 tip overlying the distal SVC in the other overlying the right atrium. Unchanged cardiomediastinal silhouette. There is new left mid to lower lung airspace disease and a small left pleural effusion. Right lung is clear. No pneumothorax. No acute osseous abnormality. IMPRESSION: New small left pleural effusion and left mid to lower lung airspace disease which could be atelectasis or pneumonia. Electronically Signed   By: Maurine Simmering M.D.   On: 11/16/2021 13:22   ? ? ?Medications:  ? ? cefTRIAXone (ROCEPHIN)  IV 2 g (11/16/21 1354)  ? DAPTOmycin (CUBICIN)  IV 650 mg (11/15/21 1716)  ? ? amLODipine  5 mg Oral QPM  ? apixaban  5 mg Oral BID  ? bisacodyl  5 mg Oral Daily  ? budesonide (PULMICORT) nebulizer solution  0.25 mg Nebulization BID  ? carvedilol  12.5 mg Oral BID  ? Chlorhexidine Gluconate Cloth  6 each Topical Daily  ? heparin sodium (porcine)      ? lidocaine  2 patch Transdermal Q24H  ? multivitamin with minerals  1 tablet Oral Daily  ? oxyCODONE  5 mg Oral Q12H  ?  polyethylene glycol  17 g Oral Daily  ? tamsulosin  0.4 mg Oral Daily  ? umeclidinium-vilanterol  1 puff Inhalation Daily  ? ? ?Assessment/ Plan:  ?   ?Principal Problem: ?  Acute kidney injury superimposed on chronic kidney disease ([place]) likely ATN/prerenal azotemia d/t poor po intake and nephrotoxic Rx  ?Active Problems: ?  COPD (chronic obstructive pulmonary disease) ([place]) ?  Chronic systolic CHF (congestive heart failure) ([place]) ?  Normocytic anemia ?  Septic arthritis of hip ([place]) ?  Dyslipidemia ?  Paroxysmal atrial fibrillation (H[facility]) ?  Acute metabolic encephalopathy ?  Acute lower UTI ?  Counseling regarding advance care planning and goals of care ? ?71 y.o. male with a PMHx of hypertension, coronary artery disease, congestive heart failure, COPD, history of lung cancer now  with recurrence on radiation and chemotherapy, atrial fibrillation, s/p clipping of the brain aneurysm, chronic kidney disease with anemia now found to have worsening renal indicis and hence a renal consultation.  Patient recently had right hip arthroplasty followed by right hip joint effusion and is being treated for sepsis.  ?Patient developed acute kidney injury on the top of chronic kidney disease.  ?Baseline creatinine 1.04, GFR greater than 60 from October 19, 2021 ?Presenting creatinine of 4.63/GFR 13 from November 05, 2021 ? ? ?#1: Acute kidney injury with hypokalemia: Patient with acute kidney injury most likely secondary to severe prerenal azotemia due to decreased p.o. intake on the top of being on diuretics and Entresto.  There may be a component of ATN.  Appreciate vascular surgery placing PermCath on 11/10/2021.  Dialysis initiated at that time.   ?-Third dialysis treatment on Thursday ?-Monitor labs and urine output daily for evidence of renal recovery ?-Symptomatically patient has shown significant improvement ?-Patient was dialyzed today ?-Further dialysis treatments based on labs and clinical condition ?-Start outpatient discharge planning-[facility] ?  ?#2: Sepsis: Ceftriaxone and Daptomycin continue per primary team.  ID following closely for antibiotic management.  Will need antibiotics set up as outpatient during dialysis or at home because dialysis schedule might be undetermined. ?  ?#3: Anemia, multifactorial:  ?Hgb 7.5 .  We will defer need for blood transfusions/EPO to hematology ?  ?#4: Hypertension:  ?BP is better  ?Continue carvedilol & amlodipine ?   ? ? ? ? LOS: 11 ?Torrence Hammack s Theador Hawthorne ?[facility] ?5/8/20235:07 PM ?  ?

## 2021-11-16 NOTE — Progress Notes (Addendum)
Physical Therapy Treatment ?Patient Details ?Name: Richard Wiggins ?MRN: [id number] ?DOB: [birth year] ?Today's Date: 11/16/2021 ? ? ?History of Present Illness Pt is a 71 yo male that presented to the ED for worsening weakness and confusion. Underwent dialysis catheter placement this admission as well.  Pt is currently be treated for lung cancer at cancer center, PMH includes COPD, CHF, seizure, anxiety, CVA, recently septic R hip replacement status post I&D 10/16/2021. ? ?  ?PT Comments  ? ? Pt alert, agreeable to mobility with some encouragement. Pt more limited this session due to fatigue, as well as dizziness after sitting for a few minutes. He was able to transfer to EOB with bed rails, extended time, CGA. Overall poor sitting balance this session, strong L lateral lean throughout, but was able to sit midline momentarily, but fatigued quickly. SpO2 ranging from 85-92%, RN aware (BP WFLs). Due to pt dizziness, 2L via [place] placed on pt with RN consent. Further mobility deferred due to fatigue and dizziness, modA to return to supine for BLE assist. Pt able to reposition in bed with very light minA, use of bed features. The patient would benefit from further skilled PT intervention to continue to progress towards goals. Recommendation remains appropriate pending pt's progress with mobility next session.  ? ? ?   ?Recommendations for follow up therapy are one component of a multi-disciplinary discharge planning process, led by the attending physician.  Recommendations may be updated based on patient status, additional functional criteria and insurance authorization. ? ?Follow Up Recommendations ? Home health PT ?  ?  ?Assistance Recommended at Discharge Frequent or constant Supervision/Assistance  ?Patient can return home with the following A little help with walking and/or transfers;A little help with bathing/dressing/bathroom;Assistance with cooking/housework;Direct supervision/assist for medications management;Assist for  transportation;Help with stairs or ramp for entrance ?  ?Equipment Recommendations ? None recommended by PT  ?  ?Recommendations for Other Services   ? ? ?  ?Precautions / Restrictions Precautions ?Precautions: Fall ?Restrictions ?Weight Bearing Restrictions: Yes ?RLE Weight Bearing: Weight bearing as tolerated  ?  ? ?Mobility ? Bed Mobility ?Overal bed mobility: Needs Assistance ?Bed Mobility: Supine to Sit, Sit to Supine ?  ?  ?Supine to sit: Min guard ?Sit to supine: Mod assist ?  ?General bed mobility comments: CGA truly to get EOB, light minA to assist with repositioning due to L lateral lean in sitting. modA to return to supine due to fatigue, dizziness ?  ? ?Transfers ?  ?  ?  ?  ?  ?  ?  ?  ?  ?General transfer comment: deferred. pt fatigued and dizzy ?  ? ?Ambulation/Gait ?  ?  ?  ?  ?  ?  ?  ?  ? ? ?Stairs ?  ?  ?  ?  ?  ? ? ?Wheelchair Mobility ?  ? ?Modified Rankin (Stroke Patients Only) ?  ? ? ?  ?Balance Overall balance assessment: Needs assistance ?Sitting-balance support: Feet supported, Single extremity supported ?Sitting balance-Leahy Scale: Poor ?Sitting balance - Comments: L lateral lean noted throughout. able to maintain midline momentarily, but did return to L lateral lean with fatigue ?  ?  ?  ?  ?  ?  ?  ?  ?  ?  ?  ?  ?  ?  ?  ?  ? ?  ?Cognition Arousal/Alertness: Awake/alert ?Behavior During Therapy: [facility] for tasks assessed/performed ?Overall Cognitive Status: Within Functional Limits for tasks assessed ?  ?  ?  ?  ?  ?  ?  ?  ?  ?  ?  ?  ?  ?  ?  ?  ?  ?  ?  ? ?  ?  Exercises General Exercises - Lower Extremity ?Ankle Circles/Pumps: AROM, Both, 10 reps ?Heel Slides: AROM, Strengthening, Left, 10 reps, AAROM, Right ?Hip ABduction/ADduction: AROM, Strengthening, Both, 10 reps ? ?  ?General Comments   ?  ?  ? ?Pertinent Vitals/Pain Pain Assessment ?Pain Assessment: Faces ?Faces Pain Scale: Hurts even more ?Pain Location: BUE shoulders ?Pain Descriptors / Indicators: Grimacing, Sore ?Pain  Intervention(s): Limited activity within patient's tolerance, Monitored during session, Repositioned  ? ? ?Home Living   ?  ?  ?  ?  ?  ?  ?  ?  ?  ?   ?  ?Prior Function    ?  ?  ?   ? ?PT Goals (current goals can now be found in the care plan section) Progress towards PT goals: Progressing toward goals ? ?  ?Frequency ? ? ? Min 2X/week ? ? ? ?  ?PT Plan Current plan remains appropriate  ? ? ?Co-evaluation   ?  ?  ?  ?  ? ?  ?AM-PAC PT "6 Clicks" Mobility   ?Outcome Measure ? Help needed turning from your back to your side while in a flat bed without using bedrails?: A Little ?Help needed moving from lying on your back to sitting on the side of a flat bed without using bedrails?: A Little ?Help needed moving to and from a bed to a chair (including a wheelchair)?: A Little ?Help needed standing up from a chair using your arms (e.g., wheelchair or bedside chair)?: A Little ?Help needed to walk in hospital room?: A Little ?Help needed climbing 3-5 steps with a railing? : A Little ?6 Click Score: 18 ? ?  ?End of Session Equipment Utilized During Treatment: Gait belt ?Activity Tolerance: Patient limited by fatigue;Other (comment) (limited by dizziness) ?Patient left: in bed;with call bell/phone within reach;with bed alarm set ?Nurse Communication: Mobility status ?PT Visit Diagnosis: Unsteadiness on feet (R26.81);Other abnormalities of gait and mobility (R26.89);Muscle weakness (generalized) (M62.81);Difficulty in walking, not elsewhere classified (R26.2);Pain ?Pain - Right/Left: Right ?Pain - part of body: Hip ?  ? ? ?Time: 8937-3428 ?PT Time Calculation (min) (ACUTE ONLY): 23 min ? ?Charges:  $Therapeutic Exercise: 8-22 mins ?$Therapeutic Activity: 8-22 mins          ?          ? ?Lieutenant Diego PT, DPT ?4:05 PM,11/16/21 ? ? ?

## 2021-11-16 NOTE — Progress Notes (Signed)
Following patient for outpatient hemodialysis placement. Discussed clinic options with Mrs. Lahman, wife chose [facility] ([place]), and patient will continue to follow [employer] [facility]. Patient has [employer], this may cause a delay in discharge due to requiring pre authorization for outpatient placement. Education provided.  ?

## 2021-11-16 NOTE — Progress Notes (Signed)
Hemodialysis Post Treatment Note ? ?16 Nov 2021 ? ?Access: LIJ Catheter  ? ?Treatment Time: 2.5 Hours ? ?UF Removed: 0 ml ? ?Next Scheduled Treatment: 11/18/2021 ? ?Note: ?Patient completes scheduled 2.5 hrs. hemodialysis treatment. Left IJ catheter, intact, no signs of infection. Prescribed BFR maintained throughout the course of treatment. Patient without fluid removal for this session. Labs ordered drawn. Patient experiencing drops in SpO2 place on 2LPM O2 via [place] with O2 sats > 90%. Patient without concerns of pain or discomfort, hemodynamically stable, transported to assigned room. Report has been given to primary nurse.  ?

## 2021-11-16 NOTE — Progress Notes (Signed)
OT Cancellation Note ? ?Patient Details ?Name: Richard Wiggins ?MRN: [id number] ?DOB: Jan 21, 1951 ? ? ?Cancelled Treatment:    Reason Eval/Treat Not Completed: Patient at procedure or test/ unavailable. Pt out of the room, at dialysis. Will re-attempt OT tx at later date/time as pt is available.  ? ?Ardeth Perfect., MPH, MS, OTR/L ?ascom [phone] ?11/16/21, 10:01 AM ? ?

## 2021-11-16 NOTE — Progress Notes (Signed)
[facility] [facility] ? ? ?PATIENT NAME: Richard Wiggins   ? ?MR#:  [id number] ? ?DATE OF BIRTH:  Nov 21, 1950 ? ?SUBJECTIVE:  ?patient resting currently. ? ?Patient restarted hemodialysis again today. No new complaints. ? ? ?VITALS:  ?Blood pressure (!) 175/77, pulse 68, temperature 98 ?F (36.7 ?C), resp. rate 17, height 5\' 10"  (1.778 m), weight 82.9 kg, SpO2 92 %. ? ?PHYSICAL EXAMINATION:  ? ?GENERAL:  71 y.o.-year-old patient lying in the bed with no acute distress. Deconditioned ?LUNGS: Normal breath sounds bilaterally, no wheezing, rales, rhonchi. Left chest perm cath, port a cath ?CARDIOVASCULAR: S1, S2 normal. No murmurs ?ABDOMEN: Soft, nontender, nondistended. Bowel sounds present.  ?EXTREMITIES: No  edema b/l.    ?NEUROLOGIC: nonfocal  patient is alert ? ?LABORATORY PANEL:  ?CBC ?Recent Labs  ?Lab 11/13/21 ?1021  ?WBC 4.2  ?HGB 7.5*  ?HCT 24.0*  ?PLT 136*  ? ? ? ?Chemistries  ?Recent Labs  ?Lab 11/15/21 ?2130  ?NA 138  ?K 3.6  ?CL 101  ?CO2 28  ?GLUCOSE 90  ?BUN 30*  ?CREATININE 5.87*  ?CALCIUM 8.4*  ?MG 2.2  ? ? ? ?Assessment and Plan ?Richard Wiggins is a 71 year old Caucasian male, PMH COPD, HTN, HLD, PE, seizure disorder, CVA, anxiety, recent diagnosis of septic right hip status post I&D on 10/16/2021 followed with IV antibiotic therapy cefepime/daptomycin.  He presented to ED 11/05/2021 with chief complaint altered mental status and weakness.   ? ?Acute kidney injury superimposed on chronic kidney disease ([place]) likely ATN/prerenal azotemia d/t poor po intake and nephrotoxic Rx  ?---Creatinine  is increased from 4.6 on 11/05/21 to 5.1 on 11/06/21, to 6.11 on 11/07/21 --> nephrology consulted ?-Creatinine about the same on 11/08/2021, 6.10 --> worse on 05/01 to 6.5 --> 7.0 on 11/10/21  ?--Planning permacath placement and hemodialysis  ?--Avoid nephrotoxins (holding home entresto, holding diuresis unless absolutely needed, holding home eliquis) ?--5/2 pt has perm cath placement   ?--5/3-- started on hemodialysis ?--5/4-- out of bed to chair. Overall feels better. Physical therapy to be started. ?--5/5-- creatinine trending down to 3.6. Hemodialysis on hold for the weekend to monitor renal function ?--5/6-- mild nausea and dizziness ?--5/7--looking and feeling better. Creat upto 5.17--D/w [name]. Will be initiated with HD from tomorrow. He will have outpatient chair time. ?--5/8-- restarted hemodialysis. Dialysis coordinated to arrange for outpatient chair time ? ?Septic arthritis of hip ([place]) ?--now associated with right hip fluid collection possibly seroma less likely abscess ?--Per infectious disease recs: IV  ceftriaxone qd, daptomycin ordered q 48 hours  ?--orthopedic consultation with Dr Rudene Christians- some fluid accumulation is expected, no surgery planned for now given his other risk factors -- if needed consider IR intervention to aspirate the fluid/abscess  ?-- started physical therapy ?  ?Acute metabolic encephalopathy ?-- secondary to his AKI as well as septic right hip arthritis. ?--pt more lucid ?  ?Acute lower UTI ?--Likely contributing to metabolic encephalopathy and AKI ?IV cefepime --> cefrtiaxone per ID ?--follow urine culture-- less than 10,000 and significant growth ?   ?Paroxysmal atrial fibrillation ([facility]) ?--Held anticoagulation for now given anemia / bleed risk, may restart w/ heparin vs home eliquis after cath placement / if Hgb stays up ?--5/3--hgb 8.2--resume eliquis ?  ?Dyslipidemia ?--Continue statin therapy at reduced dose to avoid hepatotoxicity w/ daptomycin ?  ?Normocytic anemia ?--Recent surgery, some anemia expected  ?--Hgb improved post PRBC ?--s/p BM biopsy 11/09/21, pathology pending, HemOnc following  ?  ?Chronic systolic CHF (congestive  heart failure) ([place]) ?--Not in exacerbation as of admission, however needs fluids to treat AKI (Cr is increased from 4.6 yesterday 11/05/21 to 5.1 11/06/21,  6.1 on 11/07/21) ?--Last Echo on file 02/2021 EF 55-60% ?--Holding  entresto and lasix for now given AKI ?  ?COPD (chronic obstructive pulmonary disease) ([place]) ?Prn inhalers ?  ? ?Procedures:perm cath placement, Bone marrow biospy ?Family communication wife at bedside ?Consults : G.I., orthopedic, ID, hematology ?CODE STATUS: DNR ?DVT Prophylaxis : eliquis ?Level of care: Med-Surg ?Status is: Inpatient ?Remains inpatient appropriate because: multiple medical issues with ongoing IV antibiotics and starting new dialysis ? ?physical therapy consulted. TOC for discharge planning ?monitoring renal function over the weekend.  ?Creatinine trending up. Patient will need to be resumed on at hemodialysis from tomorrow. He will need to have outpatient chair time prior to discharge. Discussed with Dr. Mechele Collin and patient with wife ? ?TOTAL TIME TAKING CARE OF THIS PATIENT: 30 minutes.  ?>50% time spent on counselling and coordination of care ? ?Note: This dictation was prepared with Dragon dictation along with smaller phrase technology. Any transcriptional errors that result from this process are unintentional. ? ?Fritzi Mandes M.D  ? ? ?[facility]  ? ?CC: ?Primary care physician; Clinic, [facility]  ?

## 2021-11-17 DIAGNOSIS — N189 Chronic kidney disease, unspecified: Secondary | ICD-10-CM | POA: Diagnosis not present

## 2021-11-17 DIAGNOSIS — N179 Acute kidney failure, unspecified: Secondary | ICD-10-CM | POA: Diagnosis not present

## 2021-11-17 LAB — BRAIN NATRIURETIC PEPTIDE: B Natriuretic Peptide: 745.4 pg/mL — ABNORMAL HIGH (ref 0.0–100.0)

## 2021-11-17 MED ORDER — PREDNISONE 20 MG PO TABS
20.0000 mg | ORAL_TABLET | Freq: Every day | ORAL | Status: DC
  Administered 2021-11-17 – 2021-11-19 (×3): 20 mg via ORAL
  Filled 2021-11-17 (×3): qty 1

## 2021-11-17 NOTE — Progress Notes (Signed)
OT Cancellation Note ? ?Patient Details ?Name: Nil Xiong ?MRN: [id number] ?DOB: November 08, 1950 ? ? ?Cancelled Treatment:    Reason Eval/Treat Not Completed: Other (comment). Upon attempt, nurse in room for care, requesting OT come back at later date/time. Will re-attempt as schedule permits.  ? ?Ardeth Perfect., MPH, MS, OTR/L ?ascom [phone] ?11/17/21, 1:13 PM ?

## 2021-11-17 NOTE — Progress Notes (Signed)
?[facility]  ?PROGRESS NOTE  ? ?Subjective:  ? ? ?Patient was seen today on second floor ?Patient wife was present in the room ?Patient interview patient is status in the chart and had questions about patient's chest x-ray.  I answered patient questions about chest x-ray with kidney related issues to the best of my ability ? ? ?Objective:  ?Vital signs: ?Blood pressure (!) 136/59, pulse 76, temperature 98.5 ?F (36.9 ?C), temperature source Oral, resp. rate 19, height 5\' 10"  (1.778 m), weight 82.9 kg, SpO2 90 %. ? ?Intake/Output Summary (Last 24 hours) at 11/17/2021 0708 ?Last data filed at 11/16/2021 1800 ?Gross per 24 hour  ?Intake 480 ml  ?Output 0 ml  ?Net 480 ml  ? ?Filed Weights  ? 11/12/21 1235 11/16/21 0907 11/16/21 1153  ?Weight: 83.1 kg 81.9 kg 82.9 kg  ? ? ? ?Physical Exam: ?General:  No acute distress  ?Head:  Normocephalic, atraumatic. Dry oral mucosal membranes  ?Eyes:  Anicteric  ?Lungs:  Ronchi +, normal effort  ?Heart:  S1S2 no rubs  ?Abdomen:   Soft, nontender  ?Extremities: No peripheral edema.  ?Neurologic:  Awake, alert, following commands  ?Skin:  No lesions  ?Access: Left PermCath placed on 11/10/2021 by Dr. Lucky Cowboy  ? ? ?Basic Metabolic Panel: ?Recent Labs  ?Lab 11/11/21 ?0416 11/13/21 ?1021 11/14/21 ?1442 11/15/21 ?9833  ?NA 141 137 137 138  ?K 3.4* 3.1* 3.6 3.6  ?CL 108 99 100 101  ?CO2 23 30 29 28   ?GLUCOSE 91 125* 144* 90  ?BUN 48* 19 28* 30*  ?CREATININE 5.44* 3.61* 5.17* 5.87*  ?CALCIUM 8.3* 8.3* 8.4* 8.4*  ?MG  --   --   --  2.2  ?PHOS  --  2.8 3.4 4.1  ? ? ?CBC: ?Recent Labs  ?Lab 11/11/21 ?0416 11/13/21 ?1021  ?WBC 4.4 4.2  ?HGB 8.2* 7.5*  ?HCT 25.9* 24.0*  ?MCV 87.5 89.9  ?PLT 152 136*  ? ? ? ?Urinalysis: ?No results for input(s): COLORURINE, LABSPEC, [place], GLUCOSEU, HGBUR, BILIRUBINUR, KETONESUR, PROTEINUR, UROBILINOGEN, NITRITE, LEUKOCYTESUR in the last 72 hours. ? ?Invalid input(s): APPERANCEUR ?  ? ? ?Imaging: ?DG Chest 1 View ? ?Result Date: 11/16/2021 ?CLINICAL DATA:   Inpatient encounter for AKI [id number] EXAM: CHEST  1 VIEW COMPARISON:  Radiograph 10/21/2021 FINDINGS: Unchanged chest port catheter with tip overlying the mid SVC. There is a left approach catheter with 1 tip overlying the distal SVC in the other overlying the right atrium. Unchanged cardiomediastinal silhouette. There is new left mid to lower lung airspace disease and a small left pleural effusion. Right lung is clear. No pneumothorax. No acute osseous abnormality. IMPRESSION: New small left pleural effusion and left mid to lower lung airspace disease which could be atelectasis or pneumonia. Electronically Signed   By: Maurine Simmering M.D.   On: 11/16/2021 13:22   ? ? ?Medications:  ? ? cefTRIAXone (ROCEPHIN)  IV 2 g (11/16/21 1354)  ? DAPTOmycin (CUBICIN)  IV 650 mg (11/15/21 1716)  ? ? amLODipine  5 mg Oral QPM  ? apixaban  5 mg Oral BID  ? bisacodyl  5 mg Oral Daily  ? carvedilol  12.5 mg Oral BID  ? Chlorhexidine Gluconate Cloth  6 each Topical Daily  ? lidocaine  2 patch Transdermal Q24H  ? multivitamin with minerals  1 tablet Oral Daily  ? oxyCODONE  5 mg Oral Q12H  ? polyethylene glycol  17 g Oral Daily  ? tamsulosin  0.4 mg Oral Daily  ?  umeclidinium-vilanterol  1 puff Inhalation Daily  ? ?CXR ?IMPRESSION: ?New small left pleural effusion and left mid to lower lung airspace ?disease which could be atelectasis or pneumonia. ? ? ? ?Assessment/ Plan:  ?   ?Principal Problem: ?  Acute kidney injury superimposed on chronic kidney disease ([place]) likely ATN/prerenal azotemia d/t poor po intake and nephrotoxic Rx  ?Active Problems: ?  COPD (chronic obstructive pulmonary disease) ([place]) ?  Chronic systolic CHF (congestive heart failure) ([place]) ?  Normocytic anemia ?  Septic arthritis of hip ([place]) ?  Dyslipidemia ?  Paroxysmal atrial fibrillation (HCC) ?  Acute metabolic encephalopathy ?  Acute lower UTI ?  Counseling regarding advance care planning and goals of care ? ?71 y.o. male with a PMHx of hypertension, coronary  artery disease, congestive heart failure, COPD, history of lung cancer now with recurrence on radiation and chemotherapy, atrial fibrillation, s/p clipping of the brain aneurysm, chronic kidney disease with anemia now found to have worsening renal indicis and hence a renal consultation.  Patient recently had right hip arthroplasty followed by right hip joint effusion and is being treated for sepsis.  ?Patient developed acute kidney injury on the top of chronic kidney disease.  ?Baseline creatinine 1.04, GFR greater than 60 from October 19, 2021 ?Presenting creatinine of 4.63/GFR 13 from November 05, 2021 ? ? ?#1: Acute kidney injury with hypokalemia: Patient with acute kidney injury most likely secondary to severe prerenal azotemia due to decreased p.o. intake on the top of being on diuretics and Entresto.  There may be a component of ATN.  Appreciate vascular surgery placing PermCath on 11/10/2021.  Dialysis initiated at that time.   ?-Third dialysis treatment on Thursday ?-Monitor labs and urine output daily for evidence of renal recovery ?-Symptomatically patient has shown significant improvement ?-Patient was dialyzed today ?-Further dialysis treatments based on labs and clinical condition ?-Start outpatient discharge planning-[facility] ? ?-Patient was last dialyzed yesterday, no need for renal placement therapy today ?  ?#2: Sepsis: Ceftriaxone and Daptomycin continue per primary team.  ID following closely for antibiotic management.  Will need antibiotics set up as outpatient during dialysis or at home because dialysis schedule might be undetermined. ?  ?#3: Anemia, multifactorial:  ?Hgb 7.5 .  We will defer need for blood transfusions/EPO to hematology ?  ?#4: Hypertension:  ?BP is better  ?Continue carvedilol & amlodipine ?   ? ?#5 Pneumonia ?Patient has developed new airspace disease in the left lung ?Patient does have a history of cancer of the left lung ?I did discuss with the primary team about possible  need of help from pulmonary ? ? LOS: 12 ?Twain Stenseth s Theador Hawthorne ?[facility] ?5/9/20237:08 AM ?  ?

## 2021-11-17 NOTE — Consult Note (Signed)
? ? ? ?PULMONOLOGY ? ? ? ? ? ? ? ? ?Date: 11/17/2021,   ?MRN# [id number] Richard Wiggins 12-06-1950 ? ? ?  ?AdmissionWeight: 78.9 kg                 ?CurrentWeight: 82.9 kg ? ?Referring provider: Dr. Fritzi Mandes ? ? ?CHIEF COMPLAINT:  ? ?Acute on chronic hypoxemic respiratory failure ? ? ?HISTORY OF PRESENT ILLNESS  ? ?This is a pleasant 71 year old male with a history of AAA, anxiety disorder, Thera sclerosis, essential hypertension, brain aneurysm, cancer of the left lung, carotid artery disease, heart failure with reduced EF most recently 16 to 35%, chronic kidney disease stage III, advanced COPD, major depressive disorder, centrilobular emphysema, dyslipidemia, iron deficiency anemia, chronic marijuana use, history of pulmonary emboli, sigmoid diverticulitis, CVA in 2018, who came in with altered mental status with confusion and lethargy noted to have acute on chronic renal insufficiency with suspicion for toxic metabolic encephalopathy.  He was able to communicate some and denied nausea vomiting and diarrhea, denied wheezing.  His vital signs revealed tachypnea with hypertension.  Blood work on admission showed chronic anemia, acute on chronic renal failure, hyponatremia, uremia.  He had urinalysis performed which was suggestive of urinary tract infection.  He had noncontrasted CT done of the abdomen which showed a abnormality of the right hip suggestive of possible abscess which is not new however no obstructing ureteral calculi or bladder calculi and no significant acute intra-abdominal intrapelvic process and previously noted chronic 4-1/2 cm AAA. ? ? ?PAST MEDICAL HISTORY  ? ?Past Medical History:  ?Diagnosis Date  ? AAA (abdominal aortic aneurysm) ([place]) 2020  ? a. 2020 U/S: 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 -plan medical managmment with tight bp control; b. 09/2020 Abd U/S: 4.1cm.  ? Anxiety   ? Aortic atherosclerosis ([place])   ? Benign essential HTN 02/10/2020  ? BPH (benign prostatic hyperplasia) 02/09/2020  ? Brain  aneurysm   ? Cancer of upper lobe of left lung ([place]) 04/2020  ? a.) ENB/EBUS performed; Bx non-diagnostic. b.) presumed stage I NSCLC in the LUL; underwent SBRT (60 Gy over 5 fractions)  ? Carotid arterial disease ([place])   ? a. 05/2017 s/p R carotid endarterectomy following CVA.  ? Chronic HFimpEF (heart failure with improved ejection fraction) ([place])   ? a. 2018 Echo: EF 30-35%; b. 02/2021 Echo: EF 55-60%, nl RV fxn. Triv MR. Asc Ao 25mm.  ? CKD (chronic kidney disease), stage III ([place])   ? Claudication [facility])   ? a. 09/2020 ABIs: R 0.91. L 0.99.  ? COPD (chronic obstructive pulmonary disease) ([place])   ? Coronary artery disease   ? a. 06/2017 PCI ([place]): s/p PCI to the RCA. LAD 50, LCX 20.  ? Depression   ? 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 a. 02/2021 Asc Ao 56mm.  ? Emphysema of lung ([place])   ? History of blood transfusion   ? HLD (hyperlipidemia)   ? IDA (iron deficiency anemia) 02/09/2020  ? 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  ? Inguinal hernia   ? right  ? Ischemic cardiomyopathy   ? Long term current use of anticoagulant   ? a.) apixaban  ? Marijuana use, continuous 02/10/2020  ? Nosebleed   ? Osteoporosis   ? PAF (paroxysmal atrial fibrillation) ([place])   ? a. 05/2017 Dx in setting of CVA. CHA2DS2VASc = 6-->Eliquis.  ? Pre-diabetes   ? Pulmonary emboli ([place]) 06/07/2017  ? Right lower lobe pulmonary embolism small  segmental, multifocal multifocal pneumonia, mediastinal lymphadenopathy, moderate centrilobular emphysema  ? Seizures ([place])   ? Sigmoid diverticulosis   ? a.) CT 01/27/2021: severe  ? Stroke [facility])   ? a. 05/2017 - hospitalized in [place]->prolonged hospitalization in setting of R CEA, PE, and finding of RCA dzs on cath; b. Residual left sided weakness to arm and leg.  ? ? ? ?SURGICAL HISTORY  ? ?Past Surgical History:  ?Procedure Laterality Date  ? brain aneurysm with clip    ? COLONOSCOPY WITH PROPOFOL N/A 07/23/2020  ? Procedure: COLONOSCOPY WITH PROPOFOL;  Surgeon: Lin Landsman,  MD;  Location: [facility] ENDOSCOPY;  Service: Gastroenterology;  Laterality: N/A;  ? CORONARY ANGIOPLASTY WITH STENT PLACEMENT  07/08/2017  ? DIALYSIS/PERMA CATHETER INSERTION N/A 11/10/2021  ? Procedure: DIALYSIS/PERMA CATHETER INSERTION;  Surgeon: Katha Cabal, MD;  Location: [place] CV LAB;  Service: Cardiovascular;  Laterality: N/A;  ? INCISION AND DRAINAGE HIP Right 10/16/2021  ? Procedure: IRRIGATION AND DEBRIDEMENT OF INFECTED TOTAL HIP;  Surgeon: Hessie Knows, MD;  Location: [facility] ORS;  Service: Orthopedics;  Laterality: Right;  ? PORTA CATH INSERTION N/A 08/31/2021  ? Procedure: PORTA CATH INSERTION;  Surgeon: Algernon Huxley, MD;  Location: [place] CV LAB;  Service: Cardiovascular;  Laterality: N/A;  ? TOTAL HIP ARTHROPLASTY Right   ? VIDEO BRONCHOSCOPY WITH ENDOBRONCHIAL NAVIGATION N/A 04/23/2020  ? Procedure: VIDEO BRONCHOSCOPY WITH ENDOBRONCHIAL NAVIGATION;  Surgeon: Tyler Pita, MD;  Location: [facility] ORS;  Service: Pulmonary;  Laterality: N/A;  ? VIDEO BRONCHOSCOPY WITH ENDOBRONCHIAL ULTRASOUND Left 08/12/2021  ? Procedure: VIDEO BRONCHOSCOPY WITH ENDOBRONCHIAL ULTRASOUND;  Surgeon: Tyler Pita, MD;  Location: [facility] ORS;  Service: Cardiopulmonary;  Laterality: Left;  ? ? ? ?FAMILY HISTORY  ? ?Family History  ?Problem Relation Age of Onset  ? Diabetes Mother   ? Heart disease Mother   ? Stroke Father   ? Heart disease Father   ? Heart attack Father   ? Alcohol abuse Father   ? Heart disease Sister   ? Heart attack Sister   ? Heart disease Maternal Grandmother   ? Heart attack Maternal Grandmother   ? Heart attack Paternal Grandmother   ? Diabetes Brother   ? Heart disease Brother   ? Heart attack Brother   ? ? ? ?SOCIAL HISTORY  ? ?Social History  ? ?Tobacco Use  ? Smoking status: Former  ?  Packs/day: 2.00  ?  Years: 53.00  ?  Pack years: 106.00  ?  Types: Cigarettes  ?  Quit date: 05/28/2017  ?  Years since quitting: 4.4  ? Smokeless tobacco: Never  ? Tobacco comments:  ?  Quit in 2018   ?Vaping Use  ? Vaping Use: Never used  ?Substance Use Topics  ? Alcohol use: Not Currently  ? Drug use: Not Currently  ?  Types: Marijuana  ?  Comment: last smoke x1 month ago  ? ? ? ?MEDICATIONS  ? ? ?Home Medication:  ?  ?Current Medication: ? ?Current Facility-Administered Medications:  ?  acetaminophen (TYLENOL) tablet 650 mg, 650 mg, Oral, Q6H PRN, 650 mg at 11/13/21 1947 **OR** acetaminophen (TYLENOL) suppository 650 mg, 650 mg, Rectal, Q6H PRN, Schnier, Dolores Lory, MD ?  albuterol (PROVENTIL) (2.5 MG/3ML) 0.083% nebulizer solution 2.5 mg, 2.5 mg, Nebulization, Q6H PRN, Schnier, Dolores Lory, MD ?  amLODipine (NORVASC) tablet 5 mg, 5 mg, Oral, QPM, Singh, Harmeet, MD, 5 mg at 11/16/21 1722 ?  apixaban (ELIQUIS) tablet 5 mg, 5 mg, Oral,  BID, Fritzi Mandes, MD, 5 mg at 11/17/21 1027 ?  bisacodyl (DULCOLAX) EC tablet 5 mg, 5 mg, Oral, Daily, Fritzi Mandes, MD, 5 mg at 11/17/21 1027 ?  budesonide (PULMICORT) nebulizer solution 0.25 mg, 0.25 mg, Nebulization, BID PRN, Fritzi Mandes, MD ?  carvedilol (COREG) tablet 12.5 mg, 12.5 mg, Oral, BID, Schnier, Dolores Lory, MD, 12.5 mg at 11/17/21 1027 ?  cefTRIAXone (ROCEPHIN) 2 g in sodium chloride 0.9 % 100 mL IVPB, 2 g, Intravenous, Q24H, Schnier, Dolores Lory, MD, Last Rate: 200 mL/hr at 11/17/21 1028, 2 g at 11/17/21 1028 ?  Chlorhexidine Gluconate Cloth 2 % PADS 6 each, 6 each, Topical, Daily, Schnier, Dolores Lory, MD, 6 each at 11/17/21 1027 ?  DAPTOmycin (CUBICIN) 650 mg in sodium chloride 0.9 % IVPB, 650 mg, Intravenous, Q48H, Schnier, Dolores Lory, MD, Last Rate: 126 mL/hr at 11/15/21 1716, 650 mg at 11/15/21 1716 ?  diazepam (VALIUM) tablet 2 mg, 2 mg, Oral, Q8H PRN, Emeterio Reeve, DO, 2 mg at 11/14/21 0854 ?  lidocaine (LIDODERM) 5 % 2 patch, 2 patch, Transdermal, Q24H, Schnier, Dolores Lory, MD, 2 patch at 11/17/21 1025 ?  multivitamin with minerals tablet 1 tablet, 1 tablet, Oral, Daily, Schnier, Dolores Lory, MD, 1 tablet at 11/17/21 1027 ?  ondansetron (ZOFRAN) injection 4 mg,  4 mg, Intravenous, Q6H PRN, Schnier, Dolores Lory, MD, 4 mg at 11/15/21 1057 ?  ondansetron (ZOFRAN) tablet 4 mg, 4 mg, Oral, Q6H PRN **OR** [DISCONTINUED] ondansetron (ZOFRAN) injection 4 mg, 4 mg, Intr

## 2021-11-17 NOTE — Progress Notes (Signed)
[place] at [facility] ? ? ?PATIENT NAME: Richard Wiggins   ? ?MR#:  [id number] ? ?DATE OF BIRTH:  12/24/1950 ? ?SUBJECTIVE:  ?patient resting currently. Wife at bedside ? ?Patient restarted hemodialysis again  No new complaints. ? ? ?VITALS:  ?Blood pressure (!) 147/75, pulse 79, temperature 98.3 ?F (36.8 ?C), temperature source Oral, resp. rate 17, height 5\' 10"  (1.778 m), weight 82.9 kg, SpO2 91 %. ? ?PHYSICAL EXAMINATION:  ? ?GENERAL:  71 y.o.-year-old patient lying in the bed with no acute distress. Deconditioned ?LUNGS: Normal breath sounds bilaterally, no wheezing, rales, rhonchi. Left chest perm cath, port a cath ?CARDIOVASCULAR: S1, S2 normal. No murmurs ?ABDOMEN: Soft, nontender, nondistended. Bowel sounds present.  ?EXTREMITIES: No  edema b/l.    ?NEUROLOGIC: nonfocal  patient is alert ? ?LABORATORY PANEL:  ?CBC ?Recent Labs  ?Lab 11/13/21 ?1021  ?WBC 4.2  ?HGB 7.5*  ?HCT 24.0*  ?PLT 136*  ? ? ? ?Chemistries  ?Recent Labs  ?Lab 11/15/21 ?9242  ?NA 138  ?K 3.6  ?CL 101  ?CO2 28  ?GLUCOSE 90  ?BUN 30*  ?CREATININE 5.87*  ?CALCIUM 8.4*  ?MG 2.2  ? ? ? ?Assessment and Plan ?Richard Wiggins is a 71 year old Caucasian male, PMH COPD, HTN, HLD, PE, seizure disorder, CVA, anxiety, recent diagnosis of septic right hip status post I&D on 10/16/2021 followed with IV antibiotic therapy cefepime/daptomycin.  He presented to ED 11/05/2021 with chief complaint altered mental status and weakness.   ? ?Acute kidney injury superimposed on chronic kidney disease ([place]) likely ATN/prerenal azotemia d/t poor po intake and nephrotoxic Rx  ?---Creatinine  is increased from 4.6 on 11/05/21 to 5.1 on 11/06/21, to 6.11 on 11/07/21 --> nephrology consulted ?-Creatinine about the same on 11/08/2021, 6.10 --> worse on 05/01 to 6.5 --> 7.0 on 11/10/21  ?--Planning permacath placement and hemodialysis  ?--Avoid nephrotoxins (holding home entresto, holding diuresis unless absolutely needed, holding home  eliquis) ?--5/2 pt has perm cath placement  ?--5/3-- started on hemodialysis ?--5/4-- out of bed to chair. Overall feels better. Physical therapy to be started. ?--5/5-- creatinine trending down to 3.6. Hemodialysis on hold for the weekend to monitor renal function ?--5/6-- mild nausea and dizziness ?--5/7--looking and feeling better. Creat upto 5.17--D/w [name]. Will be initiated with HD from tomorrow. He will have outpatient chair time. ?--5/8-- restarted hemodialysis. Dialysis coordinated to arrange for outpatient chair time ?--5/9--CXR Left LL atelectasis vs ?PNA . Seen by Dr Lanney Gins. Recommends incentive spirometer ? ?Septic arthritis of hip ([place]) ?--with right hip fluid collection possibly seroma less likely abscess ?--Per infectious disease recs: IV  ceftriaxone qd, daptomycin ordered q 48 hours  ?--orthopedic consultation with Dr Rudene Christians- some fluid accumulation is expected, no surgery planned for now given his other risk factors -- if needed consider IR intervention to aspirate the fluid/abscess  ?-- started physical therapy--HHPT  ?  ?Acute metabolic encephalopathy ?-- secondary to his AKI as well as septic right hip arthritis. ?--pt more lucid ?  ?Acute lower UTI ?--Likely contributing to metabolic encephalopathy and AKI ?IV cefepime --> cefrtiaxone per ID ?--follow urine culture-- less than 10,000 and significant growth ?   ?Paroxysmal atrial fibrillation (HCC) ?--Held anticoagulation for now given anemia / bleed risk, may restart w/ heparin vs home eliquis after cath placement / if Hgb stays up ?--5/3--hgb 8.2--resumed eliquis ?  ?Dyslipidemia ?--Continue statin therapy at reduced dose to avoid hepatotoxicity w/ daptomycin ?  ?Normocytic anemia ?--Recent surgery, some anemia expected  ?--  Hgb improved post PRBC ?--s/p BM biopsy 11/09/21, pathology pending, HemOnc following  ?  ?Chronic systolic CHF (congestive heart failure) ([place]) ?--Not in exacerbation as of admission, however needs fluids to treat AKI  (Cr is increased from 4.6 yesterday 11/05/21 to 5.1 11/06/21,  6.1 on 11/07/21) ?--Last Echo on file 02/2021 EF 55-60% ?--Holding entresto and lasix for now given AKI ?  ?COPD (chronic obstructive pulmonary disease) ([place]) ?Prn inhalers ?  ? ?Procedures:perm cath placement, Bone marrow biospy ?Family communication wife at bedside ?Consults : G.I., orthopedic, ID, hematology ?CODE STATUS: DNR ?DVT Prophylaxis : eliquis ?Level of care: Med-Surg ?Status is: Inpatient ?Remains inpatient appropriate because: multiple medical issues with ongoing IV antibiotics and starting new dialysis ? ?physical therapy consulted. TOC for discharge planning ?monitoring renal function over the weekend.  ?Creatinine trending up. Patient will need to be resumed on at hemodialysis from tomorrow. He will need to have outpatient chair time prior to discharge. Discussed with Dr. Mechele Collin and patient with wife ? ?TOTAL TIME TAKING CARE OF THIS PATIENT: 30 minutes.  ?>50% time spent on counselling and coordination of care ? ?Note: This dictation was prepared with Dragon dictation along with smaller phrase technology. Any transcriptional errors that result from this process are unintentional. ? ?Fritzi Mandes M.D  ? ? ?[facility]  ? ?CC: ?Primary care physician; Clinic, [facility]  ?

## 2021-11-17 NOTE — Progress Notes (Signed)
ID ?Wife at bedside ?Apparently  patient was confused this morning. ?Did not know where he was ?Is better now.  Seen by pulmonologist ?Started on steroids. ? ? ?O/e ?Awake and alert,  ?Pale ?Patient Vitals for the past 24 hrs: ? BP Temp Temp src Pulse Resp SpO2  ?11/17/21 1232 (!) 147/75 98.3 ?F (36.8 ?C) Oral 79 17 91 %  ?11/17/21 0832 (!) 149/73 98.6 ?F (37 ?C) Oral 82 16 90 %  ?11/17/21 0425 (!) 136/59 98.5 ?F (36.9 ?C) Oral 76 19 90 %  ?11/16/21 2025 (!) 153/71 98.1 ?F (36.7 ?C) Oral 67 16 91 %  ?11/16/21 1716 (!) 166/86 98.4 ?F (36.9 ?C) -- 65 17 95 %  ?11/16/21 1430 (!) 151/73 -- -- -- -- --  ?  ?Chest b/l air entry ?Decreased left base ?Hs1s2 ?Port present ?Abd soft ?Rt hip- surgical site okay- minimal swelling- no tenderness or erythema ?CNS non focal ? ?Labs ? ?  Latest Ref Rng & Units 11/13/2021  ? 10:21 AM 11/11/2021  ?  4:16 AM 11/10/2021  ?  3:10 AM  ?CBC  ?WBC 4.0 - 10.5 K/uL 4.2   4.4   4.4    ?Hemoglobin 13.0 - 17.0 g/dL 7.5   8.2   7.5    ?Hematocrit 39.0 - 52.0 % 24.0   25.9   23.7    ?Platelets 150 - 400 K/uL 136   152   148    ?  ? ?  Latest Ref Rng & Units 11/15/2021  ?  5:38 AM 11/14/2021  ?  2:42 PM 11/13/2021  ? 10:21 AM  ?CMP  ?Glucose 70 - 99 mg/dL 90   144   125    ?BUN 8 - 23 mg/dL _0 ?Creatinine 0.61 - 1.24 mg/dL 5.87   5.17   3.61    ?Sodium 135 - 145 mmol/L 138   137   137    ?Potassium 3.5 - 5.1 mmol/L 3.6   3.6   3.1    ?Chloride 98 - 111 mmol/L 101   100   99    ?CO2 22 - 32 mmol/L _1 ?Calcium 8.9 - 10.3 mg/dL 8.4   8.4   8.3    ?CK8 77 ? ?Microbiology ?Blood culture from 10/29/2021 no growth ? ?Impression/recommendation ? ?AKI on CKD. ?On dilaysis ? ?Encephalopathy secondary to AKI- resolved ?But still has some intermittent confusion ? ? 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  He was sent home on IV antibiotics cefepime and daptomycin and the plan was to give him a total of 6 weeks.  Until 11/26/2021.   But now the antibiotics have been changed to ceftriaxone and daptomycin and adjusted for creatinine clearance.- after IV antibiotic he will need pO med for 6 more weeks- Normally would have given cipro and bactrim but cannot use quinolone because of aortic aneursym. Bactrim may be CI with kidney issues. May just do doxy ? ?CK levels  were high.  Resolved  after DC atorvastatin ? ?Non-small cell CA lung.  Received 2 doses of chemotherapy and status post radiation ? ?History of cerebral aneurysm status post clipping ? ?Hyperlipidemia on atorvastatin which has been discontinued now ? ?Severe anemia status post PRBC underwent bone marrow biopsy -pathology okay ? ?CHF  ? ?COPD ? ?Advanced aortoiliac atherosclerotic disease. ?There is a 4.7 cm  fusiform infrarenal abdominal aortic aneurysm. ? ?Discussed the management with patient and his wife ? ?  ?OPAT Orders ?  ?Diagnosis: ?Rt HIP  Prosthetic joint infection -culture negative ?Baseline Creatinine AKI on dialysis ?  ?  ?  ?     ?Allergies  ?Allergen Reactions  ? Tape Rash  ?    plastic  ? Wound Dressing Adhesive Itching and Rash  ?    Gets stuck to skin, makes wound spread  ?  ?  ?Discharge antibiotics: ?Daptomycin 650mg every 48 hrs until 11/26/21 ?  ?Ceftriaxone 2 grams IV every 24 hours ?Until 11/26/21 ?  ?Thru PORT ?  ?Labs weekly while on IV antibiotics: ?_X_ CBC with differential ?  ?_X_ CMP ? ?X CPK ?  ?  ?  ?Fax weekly labs to Dr. [phone] ?  ?Clinic Follow Up Appt: 11/24/21 at 9.15 AM ?  ?  ?Call [phone] with any questions ? ?  ? ?

## 2021-11-17 NOTE — Progress Notes (Signed)
?   11/17/21 1100  ?Clinical Encounter Type  ?Visited With Patient and family together  ?Visit Type Initial  ?Spiritual Encounters  ?Spiritual Needs Emotional;Prayer  ? ?Chaplain provided support through reflective listening, meaningful conversation and prayer. ?

## 2021-11-17 NOTE — Progress Notes (Signed)
Physical Therapy Treatment ?Patient Details ?Name: Richard Wiggins ?MRN: [id number] ?DOB: [birth year] ?Today's Date: 11/17/2021 ? ? ?History of Present Illness Pt is a 71 yo male that presented to the ED for worsening weakness and confusion. Underwent dialysis catheter placement this admission as well.  Pt is currently be treated for lung cancer at cancer center, PMH includes COPD, CHF, seizure, anxiety, CVA, recently septic R hip replacement status post I&D 10/16/2021. ? ?  ?PT Comments  ? ? Pt sleeping but does wake to voice. Agreeable to PT with time and some encouragement. Reported continued BUE/shoulder pain. Pt with improved tolerance for activity compared to last session, but still did exhibit fatigue and needed extended time to complete tasks. Supine to sit CGA, returned to supine with modA for BLE assist at end of session. Sit <> Stand with RW and CGA-supervision at least 3 times, no true physical assist needed but effortful for pt. He ambulated ~48ft, and then after a seated rest break, an additional 41ft with RW and CGA, close chair follow on 3L via [place]. At end of session pt educated on use of incentive spirometer as well as importance of continued mobility to ensure safe discharge home. The patient would benefit from further skilled PT intervention to continue to progress towards goals. Recommendation remains appropriate pending further progress with pt mobility.  ?  ? ?  ?Recommendations for follow up therapy are one component of a multi-disciplinary discharge planning process, led by the attending physician.  Recommendations may be updated based on patient status, additional functional criteria and insurance authorization. ? ?Follow Up Recommendations ? Home health PT ?  ?  ?Assistance Recommended at Discharge Frequent or constant Supervision/Assistance  ?Patient can return home with the following A little help with walking and/or transfers;A little help with bathing/dressing/bathroom;Assistance with  cooking/housework;Direct supervision/assist for medications management;Assist for transportation;Help with stairs or ramp for entrance ?  ?Equipment Recommendations ? None recommended by PT  ?  ?Recommendations for Other Services   ? ? ?  ?Precautions / Restrictions Precautions ?Precautions: Fall ?Restrictions ?Weight Bearing Restrictions: Yes ?RLE Weight Bearing: Weight bearing as tolerated  ?  ? ?Mobility ? Bed Mobility ?Overal bed mobility: Needs Assistance ?Bed Mobility: Supine to Sit, Sit to Supine ?  ?  ?Supine to sit: Min guard ?Sit to supine: Mod assist ?  ?General bed mobility comments: modA for BLE assist to return to bed ?  ? ?Transfers ?Overall transfer level: Needs assistance ?Equipment used: Rolling walker (2 wheels) ?Transfers: Sit to/from Stand ?Sit to Stand: Min guard, Supervision ?  ?  ?  ?  ?  ?General transfer comment: extended time ?  ? ?Ambulation/Gait ?Ambulation/Gait assistance: Min guard ?Gait Distance (Feet):  (62ft, and then additional 49ft) ?Assistive device: Rolling walker (2 wheels) ?Gait Pattern/deviations: Step-through pattern, Decreased step length - right, Decreased step length - left ?Gait velocity: decreased ?  ?  ?General Gait Details: pt with increased shuffling step with fatigue. 1 sitting rest break facilitated by PT. spO2 and HR WNL on 3 L ? ? ?Stairs ?  ?  ?  ?  ?  ? ? ?Wheelchair Mobility ?  ? ?Modified Rankin (Stroke Patients Only) ?  ? ? ?  ?Balance Overall balance assessment: Needs assistance ?Sitting-balance support: Feet supported, Single extremity supported ?Sitting balance-Leahy Scale: Fair ?  ?  ?Standing balance support: Bilateral upper extremity supported, Reliant on assistive device for balance ?Standing balance-Leahy Scale: Fair ?  ?  ?  ?  ?  ?  ?  ?  ?  ?  ?  ?  ?  ? ?  ?  Cognition Arousal/Alertness: Awake/alert ?Behavior During Therapy: [facility] for tasks assessed/performed ?Overall Cognitive Status: Within Functional Limits for tasks assessed ?  ?  ?  ?  ?  ?  ?   ?  ?  ?  ?  ?  ?  ?  ?  ?  ?  ?  ?  ? ?  ?Exercises   ? ?  ?General Comments   ?  ?  ? ?Pertinent Vitals/Pain Pain Assessment ?Pain Assessment: Faces ?Faces Pain Scale: Hurts even more ?Pain Location: BUE shoulders ?Pain Descriptors / Indicators: Grimacing, Sore ?Pain Intervention(s): Limited activity within patient's tolerance, Repositioned, Monitored during session  ? ? ?Home Living   ?  ?  ?  ?  ?  ?  ?  ?  ?  ?   ?  ?Prior Function    ?  ?  ?   ? ?PT Goals (current goals can now be found in the care plan section) Progress towards PT goals: Progressing toward goals ? ?  ?Frequency ? ? ? Min 2X/week ? ? ? ?  ?PT Plan Current plan remains appropriate  ? ? ?Co-evaluation   ?  ?  ?  ?  ? ?  ?AM-PAC PT "6 Clicks" Mobility   ?Outcome Measure ? Help needed turning from your back to your side while in a flat bed without using bedrails?: A Little ?Help needed moving from lying on your back to sitting on the side of a flat bed without using bedrails?: A Little ?Help needed moving to and from a bed to a chair (including a wheelchair)?: A Little ?Help needed standing up from a chair using your arms (e.g., wheelchair or bedside chair)?: A Little ?Help needed to walk in hospital room?: A Little ?Help needed climbing 3-5 steps with a railing? : A Little ?6 Click Score: 18 ? ?  ?End of Session Equipment Utilized During Treatment: Gait belt ?Activity Tolerance: Patient tolerated treatment well ?Patient left: in bed;with call bell/phone within reach;with bed alarm set ?Nurse Communication: Mobility status ?PT Visit Diagnosis: Unsteadiness on feet (R26.81);Other abnormalities of gait and mobility (R26.89);Muscle weakness (generalized) (M62.81);Difficulty in walking, not elsewhere classified (R26.2);Pain ?Pain - Right/Left: Right ?Pain - part of body: Hip ?  ? ? ?Time: 9169-4503 ?PT Time Calculation (min) (ACUTE ONLY): 38 min ? ?Charges:  $Therapeutic Activity: 38-52 mins          ?          ? ?Lieutenant Diego PT, DPT ?4:12  PM,11/17/21 ? ? ?

## 2021-11-17 NOTE — Plan of Care (Signed)

## 2021-11-18 ENCOUNTER — Encounter: Payer: Self-pay | Admitting: Oncology

## 2021-11-18 ENCOUNTER — Encounter (HOSPITAL_COMMUNITY): Payer: Self-pay | Admitting: Oncology

## 2021-11-18 ENCOUNTER — Inpatient Hospital Stay: Payer: No Typology Code available for payment source

## 2021-11-18 DIAGNOSIS — N179 Acute kidney failure, unspecified: Secondary | ICD-10-CM | POA: Diagnosis not present

## 2021-11-18 DIAGNOSIS — N189 Chronic kidney disease, unspecified: Secondary | ICD-10-CM | POA: Diagnosis not present

## 2021-11-18 LAB — PREPARE RBC (CROSSMATCH)

## 2021-11-18 LAB — RENAL FUNCTION PANEL
Albumin: 1.9 g/dL — ABNORMAL LOW (ref 3.5–5.0)
Anion gap: 9 (ref 5–15)
BUN: 33 mg/dL — ABNORMAL HIGH (ref 8–23)
CO2: 27 mmol/L (ref 22–32)
Calcium: 8.2 mg/dL — ABNORMAL LOW (ref 8.9–10.3)
Chloride: 99 mmol/L (ref 98–111)
Creatinine, Ser: 6.02 mg/dL — ABNORMAL HIGH (ref 0.61–1.24)
GFR, Estimated: 9 mL/min — ABNORMAL LOW (ref >60)
Glucose, Bld: 132 mg/dL — ABNORMAL HIGH (ref 70–99)
Phosphorus: 5.6 mg/dL — ABNORMAL HIGH (ref 2.5–4.6)
Potassium: 4.1 mmol/L (ref 3.5–5.1)
Sodium: 135 mmol/L (ref 135–145)

## 2021-11-18 LAB — BLOOD GAS, ARTERIAL
Acid-Base Excess: 3.2 mmol/L — ABNORMAL HIGH (ref 0.0–2.0)
Bicarbonate: 26.9 mmol/L (ref 20.0–28.0)
FIO2: 21 %
O2 Saturation: 95 %
Patient temperature: 37
pCO2 arterial: 37 mmHg (ref 32–48)
pH, Arterial: 7.47 — ABNORMAL HIGH (ref 7.35–7.45)
pO2, Arterial: 61 mmHg — ABNORMAL LOW (ref 83–108)

## 2021-11-18 LAB — CBC
HCT: 21.7 % — ABNORMAL LOW (ref 39.0–52.0)
Hemoglobin: 6.8 g/dL — ABNORMAL LOW (ref 13.0–17.0)
MCH: 28.2 pg (ref 26.0–34.0)
MCHC: 31.3 g/dL (ref 30.0–36.0)
MCV: 90 fL (ref 80.0–100.0)
Platelets: 121 10*3/uL — ABNORMAL LOW (ref 150–400)
RBC: 2.41 MIL/uL — ABNORMAL LOW (ref 4.22–5.81)
RDW: 20.7 % — ABNORMAL HIGH (ref 11.5–15.5)
WBC: 5.3 10*3/uL (ref 4.0–10.5)
nRBC: 0 % (ref 0.0–0.2)

## 2021-11-18 MED ORDER — EPOETIN ALFA 10000 UNIT/ML IJ SOLN: 10000.0000 [IU] | INTRAMUSCULAR | Status: DC

## 2021-11-18 MED ORDER — HEPARIN SODIUM (PORCINE) 1000 UNIT/ML IJ SOLN
INTRAMUSCULAR | Status: AC
  Administered 2021-11-18: 4600 [IU]
  Filled 2021-11-18: qty 10

## 2021-11-18 MED ORDER — EPOETIN ALFA 10000 UNIT/ML IJ SOLN
INTRAMUSCULAR | Status: AC
  Administered 2021-11-18: 10000 [IU] via INTRAVENOUS
  Filled 2021-11-18: qty 1

## 2021-11-18 MED ORDER — SODIUM CHLORIDE 0.9% IV SOLUTION: Freq: Once | INTRAVENOUS | Status: AC

## 2021-11-18 MED ORDER — EPOETIN ALFA 10000 UNIT/ML IJ SOLN
10000.0000 [IU] | INTRAMUSCULAR | Status: DC
  Filled 2021-11-18: qty 1

## 2021-11-18 NOTE — Progress Notes (Signed)
PHARMACY CONSULT NOTE FOR: ? ?OUTPATIENT  PARENTERAL ANTIBIOTIC THERAPY (OPAT) ? ?Indication: culture negative prosthetic hip infection ?Regimen: daptomycin 650mg  IV q48h ?Ceftriaxone 2gm IV q24h ?End date: 11/26/2021 ? ?IV antibiotic discharge orders are pended. ?To discharging provider:  please sign these orders via discharge navigator,  ?Select New Orders & click on the button choice - Manage This Unsigned Work.  ?  ? ?Thank you for allowing pharmacy to be a part of this patient's care. ? ?Doreene Eland, PharmD, BCPS, BCIDP ?Work Cell: [phone] ?11/18/2021 11:00 AM ? ? ?

## 2021-11-18 NOTE — Progress Notes (Signed)
? ? ? ?PULMONOLOGY ? ? ? ? ? ? ? ? ?Date: 11/18/2021,   ?MRN# [id number] Richard Wiggins June 07, 1951 ? ? ?  ?AdmissionWeight: 78.9 kg                 ?CurrentWeight: 82.9 kg ? ?Referring provider: Dr. Fritzi Mandes ? ? ?CHIEF COMPLAINT:  ? ?Acute on chronic hypoxemic respiratory failure ? ? ?HISTORY OF PRESENT ILLNESS  ? ?This is a pleasant 71 year old male with a history of AAA, anxiety disorder, Thera sclerosis, essential hypertension, brain aneurysm, cancer of the left lung, carotid artery disease, heart failure with reduced EF most recently 74 to 35%, chronic kidney disease stage III, advanced COPD, major depressive disorder, centrilobular emphysema, dyslipidemia, iron deficiency anemia, chronic marijuana use, history of pulmonary emboli, sigmoid diverticulitis, CVA in 2018, who came in with altered mental status with confusion and lethargy noted to have acute on chronic renal insufficiency with suspicion for toxic metabolic encephalopathy.  He was able to communicate some and denied nausea vomiting and diarrhea, denied wheezing.  His vital signs revealed tachypnea with hypertension.  Blood work on admission showed chronic anemia, acute on chronic renal failure, hyponatremia, uremia.  He had urinalysis performed which was suggestive of urinary tract infection.  He had noncontrasted CT done of the abdomen which showed a abnormality of the right hip suggestive of possible abscess which is not new however no obstructing ureteral calculi or bladder calculi and no significant acute intra-abdominal intrapelvic process and previously noted chronic 4-1/2 cm AAA. ? ?11/18/21-patient is present with wife at bedside. She thinks hes more confused then normal We discussed having ABG done today. CXR with left  small pleural effusion and atelectasis/pna.  ? ?PAST MEDICAL HISTORY  ? ?Past Medical History:  ?Diagnosis Date  ? AAA (abdominal aortic aneurysm) ([place]) 2020  ? a. 2020 U/S: 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 -plan medical  managmment with tight bp control; b. 09/2020 Abd U/S: 4.1cm.  ? Anxiety   ? Aortic atherosclerosis ([place])   ? Benign essential HTN 02/10/2020  ? BPH (benign prostatic hyperplasia) 02/09/2020  ? Brain aneurysm   ? Cancer of upper lobe of left lung ([place]) 04/2020  ? a.) ENB/EBUS performed; Bx non-diagnostic. b.) presumed stage I NSCLC in the LUL; underwent SBRT (60 Gy over 5 fractions)  ? Carotid arterial disease ([place])   ? a. 05/2017 s/p R carotid endarterectomy following CVA.  ? 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 a. 2018 Echo: EF 30-35%; b. 02/2021 Echo: EF 55-60%, nl RV fxn. Triv MR. Asc Ao 80mm.  ? CKD (chronic kidney disease), stage III ([place])   ? Claudication [facility])   ? a. 09/2020 ABIs: R 0.91. L 0.99.  ? COPD (chronic obstructive pulmonary disease) ([place])   ? Coronary artery disease   ? a. 06/2017 PCI ([place]): s/p PCI to the RCA. LAD 50, LCX 20.  ? Depression   ? 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 a. 02/2021 Asc Ao 38mm.  ? Emphysema of lung ([place])   ? History of blood transfusion   ? HLD (hyperlipidemia)   ? IDA (iron deficiency anemia) 02/09/2020  ? 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  ? Inguinal hernia   ? right  ? Ischemic cardiomyopathy   ? Long term current use of anticoagulant   ? a.) apixaban  ? Marijuana use, continuous 02/10/2020  ? Nosebleed   ? Osteoporosis   ? PAF (paroxysmal atrial fibrillation) ([place])   ?  a. 05/2017 Dx in setting of CVA. CHA2DS2VASc = 6-->Eliquis.  ? Pre-diabetes   ? Pulmonary emboli ([place]) 06/07/2017  ? Right lower lobe pulmonary embolism small segmental, multifocal multifocal pneumonia, mediastinal lymphadenopathy, moderate centrilobular emphysema  ? Seizures ([place])   ? Sigmoid diverticulosis   ? a.) CT 01/27/2021: severe  ? Stroke [facility])   ? a. 05/2017 - hospitalized in [place]->prolonged hospitalization in setting of R CEA, PE, and finding of RCA dzs on cath; b. Residual left sided weakness to arm and leg.  ? ? ? ?SURGICAL HISTORY   ? ?Past Surgical History:  ?Procedure Laterality Date  ? brain aneurysm with clip    ? COLONOSCOPY WITH PROPOFOL N/A 07/23/2020  ? Procedure: COLONOSCOPY WITH PROPOFOL;  Surgeon: Lin Landsman, MD;  Location: [facility] ENDOSCOPY;  Service: Gastroenterology;  Laterality: N/A;  ? CORONARY ANGIOPLASTY WITH STENT PLACEMENT  07/08/2017  ? DIALYSIS/PERMA CATHETER INSERTION N/A 11/10/2021  ? Procedure: DIALYSIS/PERMA CATHETER INSERTION;  Surgeon: Katha Cabal, MD;  Location: [facility] CV LAB;  Service: Cardiovascular;  Laterality: N/A;  ? INCISION AND DRAINAGE HIP Right 10/16/2021  ? Procedure: IRRIGATION AND DEBRIDEMENT OF INFECTED TOTAL HIP;  Surgeon: Hessie Knows, MD;  Location: [facility] ORS;  Service: Orthopedics;  Laterality: Right;  ? PORTA CATH INSERTION N/A 08/31/2021  ? Procedure: PORTA CATH INSERTION;  Surgeon: Algernon Huxley, MD;  Location: [facility] CV LAB;  Service: Cardiovascular;  Laterality: N/A;  ? TOTAL HIP ARTHROPLASTY Right   ? VIDEO BRONCHOSCOPY WITH ENDOBRONCHIAL NAVIGATION N/A 04/23/2020  ? Procedure: VIDEO BRONCHOSCOPY WITH ENDOBRONCHIAL NAVIGATION;  Surgeon: Tyler Pita, MD;  Location: [facility] ORS;  Service: Pulmonary;  Laterality: N/A;  ? VIDEO BRONCHOSCOPY WITH ENDOBRONCHIAL ULTRASOUND Left 08/12/2021  ? Procedure: VIDEO BRONCHOSCOPY WITH ENDOBRONCHIAL ULTRASOUND;  Surgeon: Tyler Pita, MD;  Location: [facility] ORS;  Service: Cardiopulmonary;  Laterality: Left;  ? ? ? ?FAMILY HISTORY  ? ?Family History  ?Problem Relation Age of Onset  ? Diabetes Mother   ? Heart disease Mother   ? Stroke Father   ? Heart disease Father   ? Heart attack Father   ? Alcohol abuse Father   ? Heart disease Sister   ? Heart attack Sister   ? Heart disease Maternal Grandmother   ? Heart attack Maternal Grandmother   ? Heart attack Paternal Grandmother   ? Diabetes Brother   ? Heart disease Brother   ? Heart attack Brother   ? ? ? ?SOCIAL HISTORY  ? ?Social History  ? ?Tobacco Use  ? Smoking status: Former  ?   Packs/day: 2.00  ?  Years: 53.00  ?  Pack years: 106.00  ?  Types: Cigarettes  ?  Quit date: 05/28/2017  ?  Years since quitting: 4.4  ? Smokeless tobacco: Never  ? Tobacco comments:  ?  Quit in 2018  ?Vaping Use  ? Vaping Use: Never used  ?Substance Use Topics  ? Alcohol use: Not Currently  ? Drug use: Not Currently  ?  Types: Marijuana  ?  Comment: last smoke x1 month ago  ? ? ? ?MEDICATIONS  ? ? ?Home Medication:  ?  ?Current Medication: ? ?Current Facility-Administered Medications:  ?  acetaminophen (TYLENOL) tablet 650 mg, 650 mg, Oral, Q6H PRN, 650 mg at 11/13/21 1947 **OR** acetaminophen (TYLENOL) suppository 650 mg, 650 mg, Rectal, Q6H PRN, Schnier, Dolores Lory, MD ?  albuterol (PROVENTIL) (2.5 MG/3ML) 0.083% nebulizer solution 2.5 mg, 2.5 mg, Nebulization, Q6H PRN, Schnier, Dolores Lory, MD ?  amLODipine (NORVASC) tablet 5 mg, 5 mg, Oral, QPM, Candiss Norse, [place], MD, 5 mg at 11/17/21 1749 ?  apixaban (ELIQUIS) tablet 5 mg, 5 mg, Oral, BID, Fritzi Mandes, MD, 5 mg at 11/18/21 9741 ?  bisacodyl (DULCOLAX) EC tablet 5 mg, 5 mg, Oral, Daily, Fritzi Mandes, MD, 5 mg at 11/17/21 1027 ?  budesonide (PULMICORT) nebulizer solution 0.25 mg, 0.25 mg, Nebulization, BID PRN, Fritzi Mandes, MD ?  carvedilol (COREG) tablet 12.5 mg, 12.5 mg, Oral, BID, Schnier, Dolores Lory, MD, 12.5 mg at 11/18/21 6384 ?  cefTRIAXone (ROCEPHIN) 2 g in sodium chloride 0.9 % 100 mL IVPB, 2 g, Intravenous, Q24H, Schnier, Dolores Lory, MD, Last Rate: 200 mL/hr at 11/18/21 0935, 2 g at 11/18/21 0935 ?  Chlorhexidine Gluconate Cloth 2 % PADS 6 each, 6 each, Topical, Daily, Schnier, Dolores Lory, MD, 6 each at 11/18/21 [phone]  DAPTOmycin (CUBICIN) 650 mg in sodium chloride 0.9 % IVPB, 650 mg, Intravenous, Q48H, Schnier, Dolores Lory, MD, Last Rate: 126 mL/hr at 11/17/21 1929, 650 mg at 11/17/21 1929 ?  diazepam (VALIUM) tablet 2 mg, 2 mg, Oral, Q8H PRN, Emeterio Reeve, DO, 2 mg at 11/14/21 6803 ?  epoetin alfa (EPOGEN) injection 10,000 Units, 10,000 Units,  Subcutaneous, Q M,W,F-HD, Bhutani, Manpreet S, MD ?  lidocaine (LIDODERM) 5 % 2 patch, 2 patch, Transdermal, Q24H, Schnier, Dolores Lory, MD, 2 patch at 11/18/21 0935 ?  multivitamin with minerals tablet 1 tablet, 1 tablet, Or

## 2021-11-18 NOTE — Progress Notes (Signed)
Hemodialysis Post Treatment Note: ? ?Tx date:11/18/2021 ?Tx time:3 hours ?Access: Left CVC ?UF Removed: 1L ? ?Note:   ? ?Blood transfusion done during treatment. Completed and no complications or reaction noted. Patient has no complaints. ? ?HD completed. Tolerated well. No HD complications. Patients asymptomatic.  ? ? ? ? ? ? ?  ?

## 2021-11-18 NOTE — Plan of Care (Signed)

## 2021-11-18 NOTE — Progress Notes (Signed)
OT Cancellation Note ? ?Patient Details ?Name: Richard Wiggins ?MRN: [id number] ?DOB: 06-12-1951 ? ? ?Cancelled Treatment:    Reason Eval/Treat Not Completed: Other (comment) (attempted to see pt, pt off floor at HD. OT will re-attempt as able.) ? ?Shanon Payor, OTD OTR/L  ?11/18/21, 2:56 PM  ?

## 2021-11-18 NOTE — Progress Notes (Signed)
[place] at [facility] ? ? ?PATIENT NAME: Richard Wiggins   ? ?MR#:  [id number] ? ?DATE OF BIRTH:  04-22-1951 ? ?SUBJECTIVE:  ?Seen at HD today. C/o poor taste of food ? ? ?VITALS:  ?Blood pressure (!) 179/68, pulse (!) 51, temperature 97.7 ?F (36.5 ?C), temperature source Oral, resp. rate (!) 24, height 5\' 10"  (1.778 m), weight 81.9 kg, SpO2 95 %. ? ?PHYSICAL EXAMINATION:  ? ?GENERAL:  71 y.o.-year-old patient lying in the bed with no acute distress. Deconditioned ?LUNGS: Normal breath sounds bilaterally, no wheezing, rales, rhonchi. Left chest perm cath, port a cath + ?CARDIOVASCULAR: S1, S2 normal. No murmurs ?ABDOMEN: Soft, nontender, nondistended. Bowel sounds present.  ?EXTREMITIES: No  edema b/l.    ?NEUROLOGIC: nonfocal  patient is alert ? ?LABORATORY PANEL:  ?CBC ?Recent Labs  ?Lab 11/18/21 ?0409  ?WBC 5.3  ?HGB 6.8*  ?HCT 21.7*  ?PLT 121*  ? ? ? ?Chemistries  ?Recent Labs  ?Lab 11/15/21 ?2841 11/18/21 ?0409  ?NA 138 135  ?K 3.6 4.1  ?CL 101 99  ?CO2 28 27  ?GLUCOSE 90 132*  ?BUN 30* 33*  ?CREATININE 5.87* 6.02*  ?CALCIUM 8.4* 8.2*  ?MG 2.2  --   ? ? ? ?Assessment and Plan ?Richard Wiggins is a 71 year old Caucasian male, PMH COPD, HTN, HLD, PE, seizure disorder, CVA, anxiety, recent diagnosis of septic right hip status post I&D on 10/16/2021 followed with IV antibiotic therapy cefepime/daptomycin.  He presented to ED 11/05/2021 with chief complaint altered mental status and weakness.   ? ?Acute kidney injury superimposed on chronic kidney disease ([place]) likely ATN/prerenal azotemia d/t poor po intake and nephrotoxic Rx  ?---Creatinine  is increased from 4.6 on 11/05/21 to 5.1 on 11/06/21, to 6.11 on 11/07/21 --> nephrology consulted ?-Creatinine about the same on 11/08/2021, 6.10 --> worse on 05/01 to 6.5 --> 7.0 on 11/10/21  ?--Planning permacath placement and hemodialysis  ?--Avoid nephrotoxins (holding home entresto, holding diuresis unless absolutely needed, holding home  eliquis) ?--5/2 pt has perm cath placement  ?--5/3-- started on hemodialysis ?--5/4-- out of bed to chair. Overall feels better. Physical therapy to be started. ?--5/5-- creatinine trending down to 3.6. Hemodialysis on hold for the weekend to monitor renal function ?--5/6-- mild nausea and dizziness ?--5/7--looking and feeling better. Creat upto 5.17--D/w [name]. Will be initiated with HD from tomorrow. He will have outpatient chair time. ?--5/8-- restarted hemodialysis. Dialysis coordinated to arrange for outpatient chair time ?--5/9--CXR Left LL atelectasis vs ?PNA . Seen by Dr Lanney Gins. Recommends incentive spirometer, prednisone for 5 days ?--5/10--hgb 6.8, 1 unit BT at HD today ? ?Septic arthritis of hip ([place]) ?--with right hip fluid collection possibly seroma less likely abscess ?--Per infectious disease recs: IV  ceftriaxone qd, daptomycin ordered q 48 hours  ?--orthopedic consultation with Dr Rudene Christians- some fluid accumulation is expected, no surgery planned for now given his other risk factors -- if needed consider IR intervention to aspirate the fluid/abscess  ?-- started physical therapy--HHPT  ?  ?Acute metabolic encephalopathy ?-- secondary to his AKI as well as septic right hip arthritis. ?--pt more lucid ?  ?Acute lower UTI ?--Likely contributing to metabolic encephalopathy and AKI ?IV cefepime --> cefrtiaxone per ID ?--follow urine culture-- less than 10,000 and significant growth ?   ?Paroxysmal atrial fibrillation (HCC) ?--Held anticoagulation for now given anemia / bleed risk, may restart w/ heparin vs home eliquis after cath placement / if Hgb stays up ?--5/3--hgb 8.2--resumed eliquis ?  ?  Dyslipidemia ?--Continue statin therapy at reduced dose to avoid hepatotoxicity w/ daptomycin ?  ?Normocytic anemia ?--Recent surgery, some anemia expected  ?--Hgb improved post PRBC ?--s/p BM biopsy 11/09/21 ?--per Dr Janese Banks ok to give EPO at HD. BM results d/w p and wife ?  ?Chronic systolic CHF (congestive heart  failure) ([place]) ?--Not in exacerbation as of admission, however needs fluids to treat AKI (Cr is increased from 4.6 yesterday 11/05/21 to 5.1 11/06/21,  6.1 on 11/07/21) ?--Last Echo on file 02/2021 EF 55-60% ?--Holding entresto and lasix for now given AKI ?  ?COPD (chronic obstructive pulmonary disease) ([place]) ?Prn inhalers ?  ? ?Procedures:perm cath placement, Bone marrow biospy ?Family communication wife at bedside ?Consults : G.I., orthopedic, ID, hematology ?CODE STATUS: DNR ?DVT Prophylaxis : eliquis ?Level of care: Med-Surg ?Status is: Inpatient ?Remains inpatient appropriate because: multiple medical issues with ongoing IV antibiotics and starting new dialysis, awaiting outpt chair time ? ? ?TOTAL TIME TAKING CARE OF THIS PATIENT: 30 minutes.  ?>50% time spent on counselling and coordination of care ? ?Note: This dictation was prepared with Dragon dictation along with smaller phrase technology. Any transcriptional errors that result from this process are unintentional. ? ?Fritzi Mandes M.D  ? ? ?[facility]  ? ?CC: ?Primary care physician; [facility], [facility]  ?

## 2021-11-18 NOTE — Progress Notes (Signed)
?[facility]  ?PROGRESS NOTE  ? ?Subjective:  ? ? ?Patient was seen today on second floor ?Patient wife was present in the room ?Patient offers no new specific physical complaint. ?I then discussed with the patient regarding his recent labs.  I discussed with the patient that his hemoglobin was low and I will dialyze him again today as he will need blood transfusion ? ? ?Objective:  ?Vital signs: ?Blood pressure 130/81, pulse (!) 58, temperature 97.8 ?F (36.6 ?C), temperature source Oral, resp. rate 16, height 5\' 10"  (1.778 m), weight 82.9 kg, SpO2 94 %. ? ?Intake/Output Summary (Last 24 hours) at 11/18/2021 0838 ?Last data filed at 11/18/2021 0400 ?Gross per 24 hour  ?Intake 500 ml  ?Output 0 ml  ?Net 500 ml  ? ?Filed Weights  ? 11/12/21 1235 11/16/21 0907 11/16/21 1153  ?Weight: 83.1 kg 81.9 kg 82.9 kg  ? ? ? ?Physical Exam: ?General:  No acute distress  ?Head:  Normocephalic, atraumatic. Dry oral mucosal membranes  ?Eyes:  Anicteric  ?Lungs:  Ronchi +, normal effort  ?Heart:  S1S2 no rubs  ?Abdomen:   Soft, nontender  ?Extremities: No peripheral edema.  ?Neurologic:  Awake, alert, following commands  ?Skin:  No lesions  ?Access: Left PermCath placed on 11/10/2021 by Dr. Lucky Cowboy  ? ? ?Basic Metabolic Panel: ?Recent Labs  ?Lab 11/13/21 ?1021 11/14/21 ?1442 11/15/21 ?4709 11/18/21 ?0409  ?NA 137 137 138 135  ?K 3.1* 3.6 3.6 4.1  ?CL 99 100 101 99  ?CO2 30 29 28 27   ?GLUCOSE 125* 144* 90 132*  ?BUN 19 28* 30* 33*  ?CREATININE 3.61* 5.17* 5.87* 6.02*  ?CALCIUM 8.3* 8.4* 8.4* 8.2*  ?MG  --   --  2.2  --   ?PHOS 2.8 3.4 4.1 5.6*  ? ? ?CBC: ?Recent Labs  ?Lab 11/13/21 ?1021 11/18/21 ?0409  ?WBC 4.2 5.3  ?HGB 7.5* 6.8*  ?HCT 24.0* 21.7*  ?MCV 89.9 90.0  ?PLT 136* 121*  ? ? ? ?Urinalysis: ?No results for input(s): COLORURINE, LABSPEC, [place], GLUCOSEU, HGBUR, BILIRUBINUR, KETONESUR, PROTEINUR, UROBILINOGEN, NITRITE, LEUKOCYTESUR in the last 72 hours. ? ?Invalid input(s): APPERANCEUR ?  ? ? ?Imaging: ?DG Chest 1  View ? ?Result Date: 11/16/2021 ?CLINICAL DATA:  Inpatient encounter for AKI [id number] EXAM: CHEST  1 VIEW COMPARISON:  Radiograph 10/21/2021 FINDINGS: Unchanged chest port catheter with tip overlying the mid SVC. There is a left approach catheter with 1 tip overlying the distal SVC in the other overlying the right atrium. Unchanged cardiomediastinal silhouette. There is new left mid to lower lung airspace disease and a small left pleural effusion. Right lung is clear. No pneumothorax. No acute osseous abnormality. IMPRESSION: New small left pleural effusion and left mid to lower lung airspace disease which could be atelectasis or pneumonia. Electronically Signed   By: Maurine Simmering M.D.   On: 11/16/2021 13:22   ? ? ?Medications:  ? ? cefTRIAXone (ROCEPHIN)  IV 2 g (11/17/21 1028)  ? DAPTOmycin (CUBICIN)  IV 650 mg (11/17/21 1929)  ? ? amLODipine  5 mg Oral QPM  ? apixaban  5 mg Oral BID  ? bisacodyl  5 mg Oral Daily  ? carvedilol  12.5 mg Oral BID  ? Chlorhexidine Gluconate Cloth  6 each Topical Daily  ? lidocaine  2 patch Transdermal Q24H  ? multivitamin with minerals  1 tablet Oral Daily  ? oxyCODONE  5 mg Oral Q12H  ? polyethylene glycol  17 g Oral Daily  ? predniSONE  20 mg Oral Daily  ? tamsulosin  0.4 mg Oral Daily  ? umeclidinium-vilanterol  1 puff Inhalation Daily  ? ?CXR ?IMPRESSION: ?New small left pleural effusion and left mid to lower lung airspace ?disease which could be atelectasis or pneumonia. ? ? ? ?Assessment/ Plan:  ?   ?Principal Problem: ?  Acute kidney injury superimposed on chronic kidney disease ([place]) likely ATN/prerenal azotemia d/t poor po intake and nephrotoxic Rx  ?Active Problems: ?  COPD (chronic obstructive pulmonary disease) ([place]) ?  Chronic systolic CHF (congestive heart failure) ([place]) ?  Normocytic anemia ?  Septic arthritis of hip ([place]) ?  Dyslipidemia ?  Paroxysmal atrial fibrillation (HCC) ?  Acute metabolic encephalopathy ?  Acute lower UTI ?  Counseling regarding advance care  planning and goals of care ? ?71 y.o. male with a PMHx of hypertension, coronary artery disease, congestive heart failure, COPD, history of lung cancer now with recurrence on radiation and chemotherapy, atrial fibrillation, s/p clipping of the brain aneurysm, chronic kidney disease with anemia now found to have worsening renal indicis and hence a renal consultation.  Patient recently had right hip arthroplasty followed by right hip joint effusion and is being treated for sepsis.  ?Patient developed acute kidney injury on the top of chronic kidney disease.  ?Baseline creatinine 1.04, GFR greater than 60 from October 19, 2021 ?Presenting creatinine of 4.63/GFR 13 from November 05, 2021 ? ? ?#1: Acute kidney injury with hypokalemia: Patient with acute kidney injury most likely secondary to severe prerenal azotemia due to decreased p.o. intake on the top of being on diuretics and Entresto.  There may be a component of ATN.  Appreciate vascular surgery placing PermCath on 11/10/2021.  Dialysis initiated at that time.   ?-Third dialysis treatment on Thursday ?-Monitor labs and urine output daily for evidence of renal recovery ?-Symptomatically patient has shown significant improvement ?-Patient was dialyzed today ?-Further dialysis treatments based on labs and clinical condition ?-Start outpatient discharge planning-[facility] ?-Patient will need blood transfusion today ?We will dialyze patient to help avoid fluid overload specially when patient chest x-ray shows that patient has pleural effusion ?  ?#2: Sepsis: Ceftriaxone and Daptomycin continue per primary team.  ID following closely for antibiotic management.  Will need antibiotics set up as outpatient during dialysis or at home because dialysis schedule might be undetermined. ?  ?#3: Anemia, multifactorial:  ?Patient will need PRBC ?I had a discussion with the patient hematologist oncologist about need/risk/benefit of Epogen with this patient history of lung cancer.   As per the discussion The benefit of starting Epogen is higher than the risk ?  ?#4: Hypertension:  ?BP is better  ?Continue carvedilol & amlodipine ?   ? ?#5 Acute on chronic hypoxemic respiratory failure ? ?Patient is on broad-spectrum antibiotics ?Patient is being followed by the primary team And pulmonology ?Patient does have a history of cancer of the left lung ? ? ? ? ? LOS: 13 ?York Valliant s Theador Hawthorne ?[facility] ?5/10/20238:38 AM ?  ?

## 2021-11-18 NOTE — Progress Notes (Signed)
PT Cancellation Note ? ?Patient Details ?Name: Richard Wiggins ?MRN: [id number] ?DOB: 1950-10-13 ? ? ?Cancelled Treatment:    Reason Eval/Treat Not Completed: Patient at procedure or test/unavailable. In HD, will retry at another time. ? ? ?Ramond Dial ?11/18/2021, 1:50 PM ? ?Mee Hives, PT PhD ?Acute Rehab Dept. Number: [facility] [phone] and [place] [phone] ? ?

## 2021-11-18 NOTE — TOC Progression Note (Signed)
Transition of Care (TOC) - Progression Note  ? ? ?Patient Details  ?Name: Richard Wiggins ?MRN: [id number] ?Date of Birth: [birth year] ? ?Transition of Care (TOC) CM/SW Contact  ?[name], RN ?Phone Number: ?11/18/2021, 1:16 PM ? ?Clinical Narrative:   Spoke with Estill Bamberg the Dialysis Patient Pathways coordinator, She said she is working in [facility] today and will get up with me tomorrow on chair time ? ? ? ?Expected Discharge Plan: [place] ?Barriers to Discharge: Continued Medical Work up ? ?Expected Discharge Plan and Services ?Expected Discharge Plan: [place] ?  ?Discharge Planning Services: CM Consult ?Post Acute Care Choice: Resumption of Svcs/PTA Provider ?Living arrangements for the past 2 months: [place] ?                ?  ?DME Agency: NA ?  ?  ?  ?HH Arranged: RN, PT, OT ?[place] Agency: [place] ([place]) ?Date HH Agency Contacted: 11/09/21 ?  ?Representative spoke with at [place]: Floydene Flock ? ? ?Social Determinants of Health (SDOH) Interventions ?  ? ?Readmission Risk Interventions ? ?  11/09/2021  ? 12:19 PM  ?Readmission Risk Prevention Plan  ?Transportation Screening Complete  ?Medication Review Press photographer) Complete  ?PCP or Specialist appointment within 3-5 days of discharge Complete  ?[place] or Home Care Consult Complete  ?SW Recovery Care/Counseling Consult Complete  ?Palliative Care Screening Not Applicable  ?[place] Not Applicable  ? ? ?

## 2021-11-19 ENCOUNTER — Ambulatory Visit: Payer: Medicare Other | Admitting: Podiatry

## 2021-11-19 DIAGNOSIS — N179 Acute kidney failure, unspecified: Secondary | ICD-10-CM | POA: Diagnosis not present

## 2021-11-19 DIAGNOSIS — N189 Chronic kidney disease, unspecified: Secondary | ICD-10-CM | POA: Diagnosis not present

## 2021-11-19 LAB — BPAM RBC
Blood Product Expiration Date: 202305212359
ISSUE DATE / TIME: 202305101344
Unit Type and Rh: 1700

## 2021-11-19 LAB — TYPE AND SCREEN
ABO/RH(D): B POS
Antibody Screen: NEGATIVE
Unit division: 0

## 2021-11-19 LAB — HEMOGLOBIN: Hemoglobin: 8.9 g/dL — ABNORMAL LOW (ref 13.0–17.0)

## 2021-11-19 MED ORDER — CARVEDILOL 25 MG PO TABS: 25.0000 mg | ORAL_TABLET | Freq: Two times a day (BID) | ORAL | 3 refills | Status: DC

## 2021-11-19 MED ORDER — PREDNISONE 20 MG PO TABS: 20.0000 mg | ORAL_TABLET | Freq: Every day | ORAL | 0 refills | Status: DC

## 2021-11-19 MED ORDER — DIAZEPAM 2 MG PO TABS: 2.0000 mg | ORAL_TABLET | Freq: Three times a day (TID) | ORAL | 0 refills | Status: DC | PRN

## 2021-11-19 MED ORDER — CARVEDILOL 25 MG PO TABS
25.0000 mg | ORAL_TABLET | Freq: Two times a day (BID) | ORAL | Status: DC
Start: 2021-11-19 — End: 2021-11-19

## 2021-11-19 MED ORDER — OXYCODONE HCL 5 MG PO TABS: 5.0000 mg | ORAL_TABLET | Freq: Two times a day (BID) | ORAL | 0 refills | Status: DC

## 2021-11-19 MED ORDER — TRAZODONE HCL 50 MG PO TABS: 25.0000 mg | ORAL_TABLET | Freq: Every evening | ORAL | 0 refills | Status: DC | PRN

## 2021-11-19 MED ORDER — DAPTOMYCIN IV (FOR PTA / DISCHARGE USE ONLY): 650.0000 mg | INTRAVENOUS | 0 refills | Status: DC

## 2021-11-19 MED ORDER — CEFTRIAXONE IV (FOR PTA / DISCHARGE USE ONLY): 2.0000 g | INTRAVENOUS | 0 refills | Status: DC

## 2021-11-19 MED ORDER — AMLODIPINE BESYLATE 5 MG PO TABS: 5.0000 mg | ORAL_TABLET | Freq: Every evening | ORAL | 1 refills | Status: DC

## 2021-11-19 MED ORDER — ATORVASTATIN CALCIUM 40 MG PO TABS: ORAL_TABLET | ORAL | 0 refills | Status: DC

## 2021-11-19 NOTE — Progress Notes (Signed)
? ? ? ?PULMONOLOGY ? ? ? ? ? ? ? ? ?Date: 11/19/2021,   ?MRN# [id number] Richard Wiggins 1951/02/19 ? ? ?  ?AdmissionWeight: 78.9 kg                 ?CurrentWeight: 80.7 kg ? ?Referring provider: Dr. Fritzi Mandes ? ? ?CHIEF COMPLAINT:  ? ?Acute on chronic hypoxemic respiratory failure ? ? ?HISTORY OF PRESENT ILLNESS  ? ?This is a pleasant 71 year old male with a history of AAA, anxiety disorder, Thera sclerosis, essential hypertension, brain aneurysm, cancer of the left lung, carotid artery disease, heart failure with reduced EF most recently 28 to 35%, chronic kidney disease stage III, advanced COPD, major depressive disorder, centrilobular emphysema, dyslipidemia, iron deficiency anemia, chronic marijuana use, history of pulmonary emboli, sigmoid diverticulitis, CVA in 2018, who came in with altered mental status with confusion and lethargy noted to have acute on chronic renal insufficiency with suspicion for toxic metabolic encephalopathy.  He was able to communicate some and denied nausea vomiting and diarrhea, denied wheezing.  His vital signs revealed tachypnea with hypertension.  Blood work on admission showed chronic anemia, acute on chronic renal failure, hyponatremia, uremia.  He had urinalysis performed which was suggestive of urinary tract infection.  He had noncontrasted CT done of the abdomen which showed a abnormality of the right hip suggestive of possible abscess which is not new however no obstructing ureteral calculi or bladder calculi and no significant acute intra-abdominal intrapelvic process and previously noted chronic 4-1/2 cm AAA. ? ?11/18/21-patient is present with wife at bedside. She thinks hes more confused then normal We discussed having ABG done today. CXR with left  small pleural effusion and atelectasis/pna.  ?11/19/21- patient is resting in bed in no acute distress. He is on IV antibiotics.  We reviewed ABG and CXR with findings to suggest new right opacification. He is on supplementa  O2 beween 2-4L/min.  ? ? ? ? ?PAST MEDICAL HISTORY  ? ?Past Medical History:  ?Diagnosis Date  ? AAA (abdominal aortic aneurysm) ([place]) 2020  ? a. 2020 U/S: 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 -plan medical managmment with tight bp control; b. 09/2020 Abd U/S: 4.1cm.  ? Anxiety   ? Aortic atherosclerosis ([place])   ? Benign essential HTN 02/10/2020  ? BPH (benign prostatic hyperplasia) 02/09/2020  ? Brain aneurysm   ? Cancer of upper lobe of left lung ([place]) 04/2020  ? a.) ENB/EBUS performed; Bx non-diagnostic. b.) presumed stage I NSCLC in the LUL; underwent SBRT (60 Gy over 5 fractions)  ? Carotid arterial disease ([place])   ? a. 05/2017 s/p R carotid endarterectomy following CVA.  ? Chronic HFimpEF (heart failure with improved ejection fraction) ([place])   ? a. 2018 Echo: EF 30-35%; b. 02/2021 Echo: EF 55-60%, nl RV fxn. Triv MR. Asc Ao 39mm.  ? CKD (chronic kidney disease), stage III ([place])   ? [facility])   ? a. 09/2020 ABIs: R 0.91. L 0.99.  ? COPD (chronic obstructive pulmonary disease) ([place])   ? Coronary artery disease   ? a. 06/2017 PCI ([place]): s/p PCI to the RCA. LAD 50, LCX 20.  ? Depression   ? 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 a. 02/2021 Asc Ao 13mm.  ? Emphysema of lung ([place])   ? History of blood transfusion   ? HLD (hyperlipidemia)   ? IDA (iron deficiency anemia) 02/09/2020  ? 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  ? Inguinal hernia   ? right  ?  Ischemic cardiomyopathy   ? Long term current use of anticoagulant   ? a.) apixaban  ? Marijuana use, continuous 02/10/2020  ? Nosebleed   ? Osteoporosis   ? PAF (paroxysmal atrial fibrillation) ([place])   ? a. 05/2017 Dx in setting of CVA. CHA2DS2VASc = 6-->Eliquis.  ? Pre-diabetes   ? Pulmonary emboli ([place]) 06/07/2017  ? Right lower lobe pulmonary embolism small segmental, multifocal multifocal pneumonia, mediastinal lymphadenopathy, moderate centrilobular emphysema  ? Seizures ([place])   ? Sigmoid diverticulosis   ? a.) CT 01/27/2021: severe  ? Stroke  [facility])   ? a. 05/2017 - hospitalized in [place]->prolonged hospitalization in setting of R CEA, PE, and finding of RCA dzs on cath; b. Residual left sided weakness to arm and leg.  ? ? ? ?SURGICAL HISTORY  ? ?Past Surgical History:  ?Procedure Laterality Date  ? brain aneurysm with clip    ? COLONOSCOPY WITH PROPOFOL N/A 07/23/2020  ? Procedure: COLONOSCOPY WITH PROPOFOL;  Surgeon: Lin Landsman, MD;  Location: [facility] ENDOSCOPY;  Service: Gastroenterology;  Laterality: N/A;  ? CORONARY ANGIOPLASTY WITH STENT PLACEMENT  07/08/2017  ? DIALYSIS/PERMA CATHETER INSERTION N/A 11/10/2021  ? Procedure: DIALYSIS/PERMA CATHETER INSERTION;  Surgeon: Katha Cabal, MD;  Location: [facility] CV LAB;  Service: Cardiovascular;  Laterality: N/A;  ? INCISION AND DRAINAGE HIP Right 10/16/2021  ? Procedure: IRRIGATION AND DEBRIDEMENT OF INFECTED TOTAL HIP;  Surgeon: Hessie Knows, MD;  Location: [facility] ORS;  Service: Orthopedics;  Laterality: Right;  ? PORTA CATH INSERTION N/A 08/31/2021  ? Procedure: PORTA CATH INSERTION;  Surgeon: Algernon Huxley, MD;  Location: [place] CV LAB;  Service: Cardiovascular;  Laterality: N/A;  ? TOTAL HIP ARTHROPLASTY Right   ? VIDEO BRONCHOSCOPY WITH ENDOBRONCHIAL NAVIGATION N/A 04/23/2020  ? Procedure: VIDEO BRONCHOSCOPY WITH ENDOBRONCHIAL NAVIGATION;  Surgeon: Tyler Pita, MD;  Location: [facility] ORS;  Service: Pulmonary;  Laterality: N/A;  ? VIDEO BRONCHOSCOPY WITH ENDOBRONCHIAL ULTRASOUND Left 08/12/2021  ? Procedure: VIDEO BRONCHOSCOPY WITH ENDOBRONCHIAL ULTRASOUND;  Surgeon: Tyler Pita, MD;  Location: [facility] ORS;  Service: Cardiopulmonary;  Laterality: Left;  ? ? ? ?FAMILY HISTORY  ? ?Family History  ?Problem Relation Age of Onset  ? Diabetes Mother   ? Heart disease Mother   ? Stroke Father   ? Heart disease Father   ? Heart attack Father   ? Alcohol abuse Father   ? Heart disease Sister   ? Heart attack Sister   ? Heart disease Maternal Grandmother   ? Heart attack Maternal Grandmother    ? Heart attack Paternal Grandmother   ? Diabetes Brother   ? Heart disease Brother   ? Heart attack Brother   ? ? ? ?SOCIAL HISTORY  ? ?Social History  ? ?Tobacco Use  ? Smoking status: Former  ?  Packs/day: 2.00  ?  Years: 53.00  ?  Pack years: 106.00  ?  Types: Cigarettes  ?  Quit date: 05/28/2017  ?  Years since quitting: 4.4  ? Smokeless tobacco: Never  ? Tobacco comments:  ?  Quit in 2018  ?Vaping Use  ? Vaping Use: Never used  ?Substance Use Topics  ? Alcohol use: Not Currently  ? Drug use: Not Currently  ?  Types: Marijuana  ?  Comment: last smoke x1 month ago  ? ? ? ?MEDICATIONS  ? ? ?Home Medication:  ?  ?Current Medication: ? ?Current Facility-Administered Medications:  ?  acetaminophen (TYLENOL) tablet 650 mg, 650 mg, Oral, Q6H PRN, 650  mg at 11/13/21 1947 **OR** acetaminophen (TYLENOL) suppository 650 mg, 650 mg, Rectal, Q6H PRN, Schnier, Dolores Lory, MD ?  albuterol (PROVENTIL) (2.5 MG/3ML) 0.083% nebulizer solution 2.5 mg, 2.5 mg, Nebulization, Q6H PRN, Schnier, Dolores Lory, MD, 2.5 mg at 11/19/21 9774 ?  amLODipine (NORVASC) tablet 5 mg, 5 mg, Oral, QPM, Singh, Harmeet, MD, 5 mg at 11/18/21 1735 ?  apixaban (ELIQUIS) tablet 5 mg, 5 mg, Oral, BID, Fritzi Mandes, MD, 5 mg at 11/19/21 1027 ?  bisacodyl (DULCOLAX) EC tablet 5 mg, 5 mg, Oral, Daily, Fritzi Mandes, MD, 5 mg at 11/19/21 1027 ?  budesonide (PULMICORT) nebulizer solution 0.25 mg, 0.25 mg, Nebulization, BID PRN, Fritzi Mandes, MD ?  carvedilol (COREG) tablet 25 mg, 25 mg, Oral, BID, Fritzi Mandes, MD ?  cefTRIAXone (ROCEPHIN) 2 g in sodium chloride 0.9 % 100 mL IVPB, 2 g, Intravenous, Q24H, Schnier, Dolores Lory, MD, Last Rate: 200 mL/hr at 11/19/21 1037, 2 g at 11/19/21 1037 ?  Chlorhexidine Gluconate Cloth 2 % PADS 6 each, 6 each, Topical, Daily, Schnier, Dolores Lory, MD, 6 each at 11/19/21 1027 ?  DAPTOmycin (CUBICIN) 650 mg in sodium chloride 0.9 % IVPB, 650 mg, Intravenous, Q48H, Schnier, Dolores Lory, MD, Last Rate: 126 mL/hr at 11/17/21 1929, 650 mg at  11/17/21 1929 ?  diazepam (VALIUM) tablet 2 mg, 2 mg, Oral, Q8H PRN, Emeterio Reeve, DO, 2 mg at 11/14/21 0854 ?  epoetin alfa (EPOGEN) injection 10,000 Units, 10,000 Units, Intravenous, Q M,W,F-HD, [place]

## 2021-11-19 NOTE — Discharge Summary (Signed)
?Physician Discharge Summary ?  ?Patient: Richard Wiggins MRN: [id number] DOB: Jan 16, 1951  ?Admit date:     11/05/2021  ?Discharge date: 11/19/21  ?Discharge Physician: Fritzi Mandes  ? ?PCP: [facility]  ? ?Recommendations at discharge:  ? ?follow-up Dr. Steva Ready 16th May ?follow-up your primary care physician in 1 to 2 weeks  ?resume hemodialysis Tuesday Thursday Saturday at [facility] in [place] ? ? ?Discharge Diagnoses: ?acute kidney injury superimposed on chronic kidney disease stage V with ATN/prerenal azotemia due to poor PO intake and nephrotoxic meds along with septic arthritis of the hip ? ?Hospital Course: ? ?Richard Wiggins is a 71 year old Caucasian male, PMH COPD, HTN, HLD, PE, seizure disorder, CVA, anxiety, recent diagnosis of septic right hip status post I&D on 10/16/2021 followed with IV antibiotic therapy cefepime/daptomycin.  He presented to ED 11/05/2021 with chief complaint altered mental status and weakness.   ?  ?Acute kidney injury superimposed on chronic kidney disease ([place]) likely ATN/prerenal azotemia d/t poor po intake and nephrotoxic Rx  ?---Creatinine  is increased from 4.6 on 11/05/21 to 5.1 on 11/06/21, to 6.11 on 11/07/21 --> nephrology consulted ?-Creatinine about the same on 11/08/2021, 6.10 --> worse on 05/01 to 6.5 --> 7.0 on 11/10/21  ?--Planning permacath placement and hemodialysis  ?--Avoid nephrotoxins (holding home entresto, holding diuresis unless absolutely needed, holding home eliquis) ?--5/2 pt has perm cath placement  ?--5/3-- started on hemodialysis ?--5/4-- out of bed to chair. Overall feels better. Physical therapy to be started. ?--5/5-- creatinine trending down to 3.6. Hemodialysis on hold for the weekend to monitor renal function ?--5/6-- mild nausea and dizziness ?--5/7--looking and feeling better. Creat upto 5.17--D/w [name]. Will be initiated with HD from tomorrow. He will have outpatient chair time. ?--5/8-- restarted hemodialysis. Dialysis coordinated to  arrange for outpatient chair time ?--5/9--CXR Left LL atelectasis vs ?PNA . Seen by Dr Lanney Gins. Recommends incentive spirometer, prednisone for 5 days ?--5/10--hgb 6.8, 1 unit BT at HD today ?--5/11 hgb 8.9. pt has HD chair time [facility] at 11:15 am ?  ?Septic arthritis of hip ([place]) ?--with right hip fluid collection possibly seroma less likely abscess ?--Per infectious disease recs: IV  ceftriaxone qd, daptomycin ordered q 48 hours  till 11/26/2021 ?--orthopedic consultation with Dr Rudene Christians- some fluid accumulation is expected, no surgery planned for now given his other risk factors -- if needed consider IR intervention to aspirate the fluid/abscess  ?-- started physical therapy--HHPT  ?  ?Acute metabolic encephalopathy ?-- secondary to his AKI as well as septic right hip arthritis. ?--pt is at baseline ?  ?Acute lower UTI ?--Likely contributing to metabolic encephalopathy and AKI ?IV cefepime --> cefrtiaxone per ID ?--follow urine culture-- less than 10,000 and significant growth ?   ?Paroxysmal atrial fibrillation (HCC) ?--Held anticoagulation for now given anemia / bleed risk, may restart w/ heparin vs home eliquis after cath placement / if Hgb stays up ?--5/3--hgb 8.2--resumed eliquis ?  ?Dyslipidemia ?--Continue statin therapy at reduced dose to avoid hepatotoxicity w/ daptomycin ?  ?Normocytic anemia ?--Recent surgery, some anemia expected  ?--Hgb improved post PRBC ?--s/p BM biopsy 11/09/21 ?--per Dr Janese Banks ok to give EPO at HD. BM results d/w p and wife ?  ?Chronic systolic CHF (congestive heart failure) ([place]) ?--Not in exacerbation as of admission, however needs fluids to treat AKI (Cr is increased from 4.6 yesterday 11/05/21 to 5.1 11/06/21,  6.1 on 11/07/21) ?--Last Echo on file 02/2021 EF 55-60% ?--Holding entresto and lasix for now given AKI ?  ?COPD (  chronic obstructive pulmonary disease) ([place]) ?Prn inhalers ?  ?overall best at baseline. Will discharged to home. Discussed with Dr. Theador Hawthorne and in agreement  with plan. Discussed with wife Butch Penny at bedside she is agreeable. ?  ?Procedures:perm cath placement, Bone marrow biospy ?Family communication wife at bedside ?Consults : G.I., orthopedic, ID, hematology ?CODE STATUS: DNR ?DVT Prophylaxis : eliquis ? ?  ? ? ?Disposition: Home health ?Diet recommendation:  ?Discharge Diet Orders (From admission, onward)  ? ?  Start     Ordered  ? 11/19/21 0000  Diet - low sodium heart healthy       ? 11/19/21 1408  ? ?  ?  ? ?  ? ?Renal diet ?DISCHARGE MEDICATION: ?Allergies as of 11/19/2021   ? ?   Reactions  ? Tape Rash  ? plastic  ? Wound Dressing Adhesive Itching, Rash  ? Gets stuck to skin, makes wound spread  ? ?  ? ?  ?Medication List  ?  ? ?STOP taking these medications   ? ?baclofen 10 MG tablet ?Commonly known as: LIORESAL ?  ?ceFEPime  IVPB ?Commonly known as: MAXIPIME ?  ?dexamethasone 4 MG tablet ?Commonly known as: DECADRON ?  ?furosemide 40 MG tablet ?Commonly known as: LASIX ?  ?heparin lock flush 100 UNIT/ML Soln injection ?  ?HYDROmorphone 2 MG tablet ?Commonly known as: DILAUDID ?  ?lamoTRIgine 100 MG tablet ?Commonly known as: LAMICTAL ?  ?LORazepam 0.5 MG tablet ?Commonly known as: Ativan ?  ?oxyCODONE 10 mg 12 hr tablet ?Commonly known as: OXYCONTIN ?Replaced by: oxyCODONE 5 MG immediate release tablet ?  ?sacubitril-valsartan 49-51 MG ?Commonly known as: ENTRESTO ?  ? ?  ? ?TAKE these medications   ? ?albuterol (2.5 MG/3ML) 0.083% nebulizer solution ?Commonly known as: PROVENTIL ?Take 3 mLs (2.5 mg total) by nebulization every 6 (six) hours as needed for wheezing or shortness of breath. ?  ?albuterol 108 (90 Base) MCG/ACT inhaler ?Commonly known as: VENTOLIN HFA ?INHALE 2 PUFFS INTO THE LUNGS EVERY 6 HOURS AS NEEDED FOR WHEEZING OR SHORTNESS OF BREATH ?  ?amLODipine 5 MG tablet ?Commonly known as: NORVASC ?Take 1 tablet (5 mg total) by mouth every evening. ?  ?apixaban 5 MG Tabs tablet ?Commonly known as: Eliquis ?TAKE 1 TABLET(5 MG) BY MOUTH IN THE MORNING AND  AT BEDTIME ?  ?aspirin 81 MG chewable tablet ?CHEW ONE TABLET BY MOUTH DAILY FOR HEART ?  ?atorvastatin 40 MG tablet ?Commonly known as: LIPITOR ?HOLD for now and resume taking after completing IV daptomycin ?What changed: See the new instructions. ?  ?Breztri Aerosphere 160-9-4.8 MCG/ACT Aero ?Generic drug: Budeson-Glycopyrrol-Formoterol ?Inhale 2 puffs into the lungs in the morning and at bedtime. ?  ?carvedilol 25 MG tablet ?Commonly known as: COREG ?Take 1 tablet (25 mg total) by mouth in the morning and at bedtime. ?What changed: how much to take ?  ?cefTRIAXone  IVPB ?Commonly known as: ROCEPHIN ?Inject 2 g into the vein daily for 7 days. 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 ?  ?Chlorhexidine Gluconate Cloth 2 % Pads ?Apply 6 each topically daily. ?  ?daptomycin  IVPB ?Commonly known as: CUBICIN ?Inject 650 mg  into the vein every other day for 6 days. 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 ?What changed:  ?when to take this ?additional instructions ?  ?diazepam 2 MG tablet ?Commonly known as: VALIUM ?Take 1 tablet (2 mg total) by mouth every 8 (eight) hours as needed for anxiety. ?  ?lidocaine-prilocaine cream ?Commonly  known as: EMLA ?Apply to affected area once ?  ?Multi-Vitamin tablet ?Take 1 tablet by mouth daily. ?  ?ondansetron 8 MG tablet ?Commonly known as: Zofran ?Take 1 tablet (8 mg total) by mouth 2 (two) times d

## 2021-11-19 NOTE — Progress Notes (Signed)
?[facility]  ?PROGRESS NOTE  ? ?Subjective:  ? ? ?Patient was seen today on second floor ?Patient wife was present in the room ?Patient offers no new specific physical complaint. ?Patient's wife, physical therapist and the nurse was present in the room. ?I discussed the patient that we will most likely dialyze him tomorrow if he is still inpatientI discussed with the patient will depend upon if he gets an outpatient chair.  I was informed thereafter that patient has secured a chair on Saturday.  I then discussed the issue again with patient's wife that from dialysis standpoint patient is safe to be discharged as he already has an outpatient chair time scheduled. ? ? ?Objective:  ?Vital signs: ?Blood pressure (!) 161/77, pulse 67, temperature 98 ?F (36.7 ?C), temperature source Oral, resp. rate 17, height 5\' 10"  (1.778 m), weight 80.7 kg, SpO2 92 %. ? ?Intake/Output Summary (Last 24 hours) at 11/19/2021 0935 ?Last data filed at 11/19/2021 0526 ?Gross per 24 hour  ?Intake 370 ml  ?Output 1150 ml  ?Net -780 ml  ? ?Filed Weights  ? 11/16/21 1153 11/18/21 1330 11/18/21 1700  ?Weight: 82.9 kg 81.9 kg 80.7 kg  ? ? ? ?Physical Exam: ?General:  No acute distress  ?Head:  Normocephalic, atraumatic. Dry oral mucosal membranes  ?Eyes:  Anicteric  ?Lungs:  Ronchi +, normal effort  ?Heart:  S1S2 no rubs  ?Abdomen:   Soft, nontender  ?Extremities: No peripheral edema.  ?Neurologic:  Awake, alert, following commands  ?Skin:  No lesions  ?Access: Left PermCath placed on 11/10/2021 by Dr. Lucky Cowboy  ? ? ?Basic Metabolic Panel: ?Recent Labs  ?Lab 11/13/21 ?1021 11/14/21 ?1442 11/15/21 ?1607 11/18/21 ?0409  ?NA 137 137 138 135  ?K 3.1* 3.6 3.6 4.1  ?CL 99 100 101 99  ?CO2 30 29 28 27   ?GLUCOSE 125* 144* 90 132*  ?BUN 19 28* 30* 33*  ?CREATININE 3.61* 5.17* 5.87* 6.02*  ?CALCIUM 8.3* 8.4* 8.4* 8.2*  ?MG  --   --  2.2  --   ?PHOS 2.8 3.4 4.1 5.6*  ? ? ?CBC: ?Recent Labs  ?Lab 11/13/21 ?1021 11/18/21 ?0409  ?WBC 4.2 5.3  ?HGB 7.5*  6.8*  ?HCT 24.0* 21.7*  ?MCV 89.9 90.0  ?PLT 136* 121*  ? ? ? ?Urinalysis: ?No results for input(s): COLORURINE, LABSPEC, [place], GLUCOSEU, HGBUR, BILIRUBINUR, KETONESUR, PROTEINUR, UROBILINOGEN, NITRITE, LEUKOCYTESUR in the last 72 hours. ? ?Invalid input(s): APPERANCEUR ?  ? ? ?Imaging: ?DG Chest Port 1 View ? ?Result Date: 11/18/2021 ?CLINICAL DATA:  Pulmonary edema. EXAM: PORTABLE CHEST 1 VIEW COMPARISON:  Nov 16, 2021. FINDINGS: Stable cardiomediastinal silhouette. Right internal jugular Port-A-Cath is unchanged in position. Left internal jugular dialysis catheter is unchanged. Stable left lung opacity is noted concerning for edema or inflammation with associated pleural effusion. New right midlung opacity is noted concerning for possible inflammation. Bony thorax is unremarkable. IMPRESSION: Stable left lung opacity and pleural effusion is noted as described above. New right midlung opacity is noted concerning for possible pneumonia. Electronically Signed   By: Marijo Conception M.D.   On: 11/18/2021 19:17   ? ? ?Medications:  ? ? cefTRIAXone (ROCEPHIN)  IV 2 g (11/18/21 0935)  ? DAPTOmycin (CUBICIN)  IV 650 mg (11/17/21 1929)  ? ? amLODipine  5 mg Oral QPM  ? apixaban  5 mg Oral BID  ? bisacodyl  5 mg Oral Daily  ? carvedilol  12.5 mg Oral BID  ? Chlorhexidine Gluconate Cloth  6 each  Topical Daily  ? epoetin (EPOGEN/PROCRIT) injection  10,000 Units Intravenous Q M,W,F-HD  ? lidocaine  2 patch Transdermal Q24H  ? multivitamin with minerals  1 tablet Oral Daily  ? oxyCODONE  5 mg Oral Q12H  ? polyethylene glycol  17 g Oral Daily  ? predniSONE  20 mg Oral Daily  ? tamsulosin  0.4 mg Oral Daily  ? umeclidinium-vilanterol  1 puff Inhalation Daily  ? ?CXR ?IMPRESSION: ?New small left pleural effusion and left mid to lower lung airspace ?disease which could be atelectasis or pneumonia. ? ? ? ?Assessment/ Plan:  ?   ?Principal Problem: ?  Acute kidney injury superimposed on chronic kidney disease ([place]) likely  ATN/prerenal azotemia d/t poor po intake and nephrotoxic Rx  ?Active Problems: ?  COPD (chronic obstructive pulmonary disease) ([place]) ?  Chronic systolic CHF (congestive heart failure) ([place]) ?  Normocytic anemia ?  Septic arthritis of hip ([place]) ?  Dyslipidemia ?  Paroxysmal atrial fibrillation (HCC) ?  Acute metabolic encephalopathy ?  Acute lower UTI ?  Counseling regarding advance care planning and goals of care ? ?71 y.o. male with a PMHx of hypertension, coronary artery disease, congestive heart failure, COPD, history of lung cancer now with recurrence on radiation and chemotherapy, atrial fibrillation, s/p clipping of the brain aneurysm, chronic kidney disease with anemia now found to have worsening renal indicis and hence a renal consultation.  Patient recently had right hip arthroplasty followed by right hip joint effusion and is being treated for sepsis.  ?Patient developed acute kidney injury on the top of chronic kidney disease.  ?Baseline creatinine 1.04, GFR greater than 60 from October 19, 2021 ?Presenting creatinine of 4.63/GFR 13 from November 05, 2021 ? ? ?#1: Acute kidney injury with hypokalemia: Patient with acute kidney injury most likely secondary to severe prerenal azotemia due to decreased p.o. intake on the top of being on diuretics and Entresto.  There may be a component of ATN.  Appreciate vascular surgery placing PermCath on 11/10/2021.  Dialysis initiated at that time.   ?-Third dialysis treatment on Thursday ?-Monitor labs and urine output daily for evidence of renal recovery ?-Symptomatically patient has shown significant improvement ?-Patient was dialyzed today ?-Further dialysis treatments based on labs and clinical condition ?-Start outpatient discharge planning-[facility] ? ? ?  ?#2: Sepsis: Ceftriaxone and Daptomycin continue per primary team.  ID following closely for antibiotic management.  Will need antibiotics set up as outpatient during dialysis or at home because dialysis  schedule might be undetermined. ?  ?#3: Anemia, multifactorial:  ?Patient had PRBC yesterday  ?I had a discussion with the patient hematologist oncologist about need/risk/benefit of Epogen with this patient history of lung cancer.  As per the discussion The benefit of starting Epogen is higher than the risk ? ? Now on Epo ?  ?#4: Hypertension:  ?BP is better  ?Continue carvedilol & amlodipine ?   ? ?#5 Acute on chronic hypoxemic respiratory failure ? ?Patient is on broad-spectrum antibiotics ?Patient is being followed by the primary team And pulmonology ?Patient does have a history of cancer of the left lung ? ? ? ? ? LOS: 14 ?Netanel Yannuzzi s Theador Hawthorne ?[facility] ?5/11/20239:35 AM ?  ?

## 2021-11-19 NOTE — Progress Notes (Signed)
Occupational Therapy Treatment ?Patient Details ?Name: Richard Wiggins ?MRN: [id number] ?DOB: 01-Mar-1951 ?Today's Date: 11/19/2021 ? ? ?History of present illness Pt is a 71 yo male that presented to the ED for worsening weakness and confusion. Underwent dialysis catheter placement this admission as well.  Pt is currently be treated for lung cancer at cancer center, PMH includes COPD, CHF, seizure, anxiety, CVA, recently septic R hip replacement status post I&D 10/16/2021. ?  ?OT comments ? Chart reviewed to date, pt greeted in room wife present, agreeable to tx session. Tx session targeted progressing strength/endurance in order to improve ADL task completion. Pt with noted progress in activity tolerance while completing standing grooming tasks with supervision with RW, amb approx 15 feet to bedside chair with RW. Pt is left in bedside chair, NAD, all needs met. OT will follow acutely.   ? ?Recommendations for follow up therapy are one component of a multi-disciplinary discharge planning process, led by the attending physician.  Recommendations may be updated based on patient status, additional functional criteria and insurance authorization. ?   ?Follow Up Recommendations ? Home health OT  ?  ?Assistance Recommended at Discharge Frequent or constant Supervision/Assistance  ?Patient can return home with the following ? A little help with walking and/or transfers;Assistance with cooking/housework;Assist for transportation;Help with stairs or ramp for entrance;Direct supervision/assist for medications management;A lot of help with bathing/dressing/bathroom ?  ?Equipment Recommendations ? Other (comment);Tub/shower seat (transport chair)  ?  ?Recommendations for Other Services   ? ?  ?Precautions / Restrictions Precautions ?Precautions: Fall  ? ? ?  ? ?Mobility Bed Mobility ?Overal bed mobility: Needs Assistance ?Bed Mobility: Supine to Sit ?  ?  ?Supine to sit: Supervision, HOB elevated ?  ?  ?  ?  ? ?Transfers ?Overall  transfer level: Needs assistance ?Equipment used: Rolling walker (2 wheels) ?Transfers: Sit to/from Stand ?Sit to Stand: Min guard, Supervision ?  ?  ?  ?  ?  ?  ?  ?  ?Balance Overall balance assessment: Needs assistance ?Sitting-balance support: Feet supported ?Sitting balance-Leahy Scale: Good ?  ?  ?Standing balance support: Reliant on assistive device for balance, During functional activity, Bilateral upper extremity supported ?Standing balance-Leahy Scale: Fair ?  ?  ?  ?  ?  ?  ?  ?  ?  ?  ?  ?  ?   ? ?ADL either performed or assessed with clinical judgement  ? ?ADL Overall ADL's : Needs assistance/impaired ?  ?  ?Grooming: Supervision/safety;Standing ?Grooming Details (indicate cue type and reason): vcs for rest breaks at sink level with RW ?  ?  ?  ?  ?  ?  ?  ?  ?Toilet Transfer: Supervision/safety;Ambulation;Rolling walker (2 wheels) ?Toilet Transfer Details (indicate cue type and reason): simulated to bedside chair ?  ?  ?  ?  ?Functional mobility during ADLs: Rolling walker (2 wheels);Min guard;Supervision/safety (approx 15 feet in room) ?  ?  ? ?Extremity/Trunk Assessment   ?  ?  ?  ?  ?  ? ?Vision   ?  ?  ?Perception   ?  ?Praxis   ?  ? ?Cognition Arousal/Alertness: Awake/alert ?Behavior During Therapy: [facility] for tasks assessed/performed ?Overall Cognitive Status: Within Functional Limits for tasks assessed ?  ?  ?  ?  ?  ?  ?  ?  ?  ?  ?  ?  ?  ?  ?  ?  ?  ?  ?  ?   ?Exercises   ? ?  ?  Shoulder Instructions   ? ? ?  ?General Comments spo2 90% on 2.5 L via [place], BP 69 at rest, 93% spo2 2.5 L via [place], HR 75 after mobility  ? ? ?Pertinent Vitals/ Pain       Pain Assessment ?Pain Assessment: No/denies pain ? ?Home Living   ?  ?  ?  ?  ?  ?  ?  ?  ?  ?  ?  ?  ?  ?  ?  ?  ?  ?  ? ?  ?Prior Functioning/Environment    ?  ?  ?  ?   ? ?Frequency ? Min 2X/week  ? ? ? ? ?  ?Progress Toward Goals ? ?OT Goals(current goals can now be found in the care plan section) ? Progress towards OT goals: Progressing toward  goals ? ?Acute Rehab OT Goals ?Patient Stated Goal: go home ?OT Goal Formulation: With patient/family ?Time For Goal Achievement: 12/03/21 ?Potential to Achieve Goals: Good  ?Plan Discharge plan remains appropriate;Frequency remains appropriate   ? ?Co-evaluation ? ? ?   ?  ?  ?  ?  ? ?  ?AM-PAC OT "6 Clicks" Daily Activity     ?Outcome Measure ? ? Help from another person eating meals?: None ?Help from another person taking care of personal grooming?: None ?Help from another person toileting, which includes using toliet, bedpan, or urinal?: A Little ?Help from another person bathing (including washing, rinsing, drying)?: A Little ?Help from another person to put on and taking off regular upper body clothing?: None ?Help from another person to put on and taking off regular lower body clothing?: A Little ?6 Click Score: 21 ? ?  ?End of Session Equipment Utilized During Treatment: Rolling walker (2 wheels);Gait belt ? ?OT Visit Diagnosis: Other abnormalities of gait and mobility (R26.89);Muscle weakness (generalized) (M62.81);Pain ?  ?Activity Tolerance Patient tolerated treatment well ?  ?Patient Left in chair;with call bell/phone within reach;with chair alarm set ?  ?Nurse Communication Mobility status ?  ? ?   ? ?Time: 4403-4742 ?OT Time Calculation (min): 37 min ? ?Charges: OT General Charges ?$OT Visit: 1 Visit ?OT Treatments ?$Self Care/Home Management : 23-37 mins ? ?Shanon Payor, OTD OTR/L  ?11/19/21, 12:59 PM  ?

## 2021-11-19 NOTE — Plan of Care (Signed)
Patient discharged per MD orders at this time.All discharge instructions,education and medications reviewed with the patient and spouse.Pt expressed understanding and will comply with dc instructions.follow up appointments was also communicated to the Pt. no verbal c/o or any ssx of distress.Pt was discharged home with HH/PT/Nursing infusion services per order.Pt was transported to home by wife in a privately owned vehicle. ?

## 2021-11-19 NOTE — TOC Progression Note (Addendum)
Transition of Care (TOC) - Progression Note  ? ? ?Patient Details  ?Name: Richard Wiggins ?MRN: [id number] ?Date of Birth: Jul 09, 1951 ? ?Transition of Care (TOC) CM/SW Contact  ?[name], RN ?Phone Number: ?11/19/2021, 2:39 PM ? ?Clinical Narrative:    ? ?Spoke with wife and reviewed that he has at home He has a 3 in 1 and a rolling walker at home but wants a shower seat delivered to the home, I notified [employer]. I verified with [employer] [facility] that IV ABX will be delivered in time for next dose, [facility] is set up for [facility] services, Meds will be delivered for next dose tomorrow  ?His wife stated that he is concerned that he will have to come back to the hospital, I explained that everything he needs is lined up and he is medically ready to DC, she agreed and stated she was fine with taking him home today ? ? ?Expected Discharge Plan: [place] ?Barriers to Discharge: Continued Medical Work up ? ?Expected Discharge Plan and Services ?Expected Discharge Plan: [place] ?  ?Discharge Planning Services: CM Consult ?Post Acute Care Choice: Resumption of Svcs/PTA Provider ?Living arrangements for the past 2 months: Richard Wiggins ?Expected Discharge Date: 11/19/21               ?  ?DME Agency: NA ?  ?  ?  ?HH Arranged: RN, PT, OT ?[place] Agency: [place] ([place]) ?Date HH Agency Contacted: 11/09/21 ?  ?Representative spoke with at [place]: Floydene Flock ? ? ?Social Determinants of Health (SDOH) Interventions ?  ? ?Readmission Risk Interventions ? ?  11/09/2021  ? 12:19 PM  ?Readmission Risk Prevention Plan  ?Transportation Screening Complete  ?Medication Review Press photographer) Complete  ?PCP or Specialist appointment within 3-5 days of discharge Complete  ?[place] or Home Care Consult Complete  ?SW Recovery Care/Counseling Consult Complete  ?Palliative Care Screening Not Applicable  ?[place] Not Applicable  ? ? ?

## 2021-11-19 NOTE — Discharge Instructions (Signed)
Your dialysis is scheduled at [facility] Tuesday Thursday Saturday at 11:15 AM ?

## 2021-11-19 NOTE — Progress Notes (Addendum)
Correction: ? ?Placement Resolved: [facility] am, arrival 9:50am. Start date 5/13 at 9:30am. Wife is aware and will go to clinic with HPOA papers tomorrow before 1pm to fill out paperwork.  ?

## 2021-11-19 NOTE — Progress Notes (Signed)
PT Cancellation Note ? ?Patient Details ?Name: Richard Wiggins ?MRN: [id number] ?DOB: [birth year] ? ? ?Cancelled Treatment:    Reason Eval/Treat Not Completed: Other (comment). Pt declining PT this PM, reported that he just wants a break, that it has been a very busy day. PT to re-attempt as able.  ? ?Lieutenant Diego PT, DPT ?1:34 PM,11/19/21 ? ?

## 2021-11-20 ENCOUNTER — Encounter: Payer: Self-pay | Admitting: Pulmonary Disease

## 2021-11-20 NOTE — Telephone Encounter (Signed)
Dr. Gonzalez, please advise. thanks 

## 2021-11-20 NOTE — Telephone Encounter (Signed)
On review of the notes it appears that Dr. Lanney Gins intended for him to have prednisone 20 mg p.o. daily x5 days.  May send prescription. ?

## 2021-11-21 ENCOUNTER — Emergency Department: Payer: No Typology Code available for payment source

## 2021-11-21 ENCOUNTER — Inpatient Hospital Stay
Admission: EM | Admit: 2021-11-21 | Discharge: 2021-11-22 | DRG: 951 | Disposition: A | Discharge status: Hospice | Payer: No Typology Code available for payment source | Attending: Hospitalist | Admitting: Hospitalist

## 2021-11-21 ENCOUNTER — Other Ambulatory Visit: Payer: Self-pay

## 2021-11-21 ENCOUNTER — Encounter: Payer: Self-pay | Admitting: Student

## 2021-11-21 DIAGNOSIS — D649 Anemia, unspecified: Secondary | ICD-10-CM | POA: Diagnosis present

## 2021-11-21 DIAGNOSIS — Z6824 Body mass index (BMI) 24.0-24.9, adult: Secondary | ICD-10-CM

## 2021-11-21 DIAGNOSIS — I2510 Atherosclerotic heart disease of native coronary artery without angina pectoris: Secondary | ICD-10-CM | POA: Diagnosis present

## 2021-11-21 DIAGNOSIS — G9341 Metabolic encephalopathy: Secondary | ICD-10-CM | POA: Diagnosis present

## 2021-11-21 DIAGNOSIS — Z86711 Personal history of pulmonary embolism: Secondary | ICD-10-CM

## 2021-11-21 DIAGNOSIS — Z96649 Presence of unspecified artificial hip joint: Secondary | ICD-10-CM

## 2021-11-21 DIAGNOSIS — Z87891 Personal history of nicotine dependence: Secondary | ICD-10-CM | POA: Diagnosis not present

## 2021-11-21 DIAGNOSIS — Z85118 Personal history of other malignant neoplasm of bronchus and lung: Secondary | ICD-10-CM

## 2021-11-21 DIAGNOSIS — Z96641 Presence of right artificial hip joint: Secondary | ICD-10-CM | POA: Diagnosis present

## 2021-11-21 DIAGNOSIS — R4182 Altered mental status, unspecified: Secondary | ICD-10-CM | POA: Diagnosis present

## 2021-11-21 DIAGNOSIS — Z8673 Personal history of transient ischemic attack (TIA), and cerebral infarction without residual deficits: Secondary | ICD-10-CM | POA: Diagnosis not present

## 2021-11-21 DIAGNOSIS — Z7901 Long term (current) use of anticoagulants: Secondary | ICD-10-CM | POA: Diagnosis not present

## 2021-11-21 DIAGNOSIS — R410 Disorientation, unspecified: Secondary | ICD-10-CM | POA: Diagnosis not present

## 2021-11-21 DIAGNOSIS — Z955 Presence of coronary angioplasty implant and graft: Secondary | ICD-10-CM

## 2021-11-21 DIAGNOSIS — N170 Acute kidney failure with tubular necrosis: Secondary | ICD-10-CM | POA: Diagnosis present

## 2021-11-21 DIAGNOSIS — I714 Abdominal aortic aneurysm, without rupture, unspecified: Secondary | ICD-10-CM | POA: Diagnosis present

## 2021-11-21 DIAGNOSIS — Z823 Family history of stroke: Secondary | ICD-10-CM

## 2021-11-21 DIAGNOSIS — R404 Transient alteration of awareness: Secondary | ICD-10-CM | POA: Diagnosis not present

## 2021-11-21 DIAGNOSIS — D631 Anemia in chronic kidney disease: Secondary | ICD-10-CM | POA: Diagnosis present

## 2021-11-21 DIAGNOSIS — R64 Cachexia: Secondary | ICD-10-CM | POA: Diagnosis present

## 2021-11-21 DIAGNOSIS — F419 Anxiety disorder, unspecified: Secondary | ICD-10-CM | POA: Diagnosis present

## 2021-11-21 DIAGNOSIS — Z992 Dependence on renal dialysis: Secondary | ICD-10-CM

## 2021-11-21 DIAGNOSIS — Y95 Nosocomial condition: Secondary | ICD-10-CM | POA: Diagnosis present

## 2021-11-21 DIAGNOSIS — R7303 Prediabetes: Secondary | ICD-10-CM | POA: Diagnosis present

## 2021-11-21 DIAGNOSIS — R627 Adult failure to thrive: Secondary | ICD-10-CM | POA: Diagnosis present

## 2021-11-21 DIAGNOSIS — R7989 Other specified abnormal findings of blood chemistry: Secondary | ICD-10-CM | POA: Diagnosis present

## 2021-11-21 DIAGNOSIS — M810 Age-related osteoporosis without current pathological fracture: Secondary | ICD-10-CM | POA: Diagnosis present

## 2021-11-21 DIAGNOSIS — Z79899 Other long term (current) drug therapy: Secondary | ICD-10-CM

## 2021-11-21 DIAGNOSIS — I739 Peripheral vascular disease, unspecified: Secondary | ICD-10-CM | POA: Diagnosis present

## 2021-11-21 DIAGNOSIS — Z743 Need for continuous supervision: Secondary | ICD-10-CM | POA: Diagnosis not present

## 2021-11-21 DIAGNOSIS — Z91158 Patient's noncompliance with renal dialysis for other reason: Secondary | ICD-10-CM

## 2021-11-21 DIAGNOSIS — J189 Pneumonia, unspecified organism: Secondary | ICD-10-CM | POA: Diagnosis present

## 2021-11-21 DIAGNOSIS — T8459XA Infection and inflammatory reaction due to other internal joint prosthesis, initial encounter: Secondary | ICD-10-CM

## 2021-11-21 DIAGNOSIS — N179 Acute kidney failure, unspecified: Secondary | ICD-10-CM | POA: Diagnosis present

## 2021-11-21 DIAGNOSIS — I5022 Chronic systolic (congestive) heart failure: Secondary | ICD-10-CM | POA: Diagnosis present

## 2021-11-21 DIAGNOSIS — J432 Centrilobular emphysema: Secondary | ICD-10-CM | POA: Diagnosis present

## 2021-11-21 DIAGNOSIS — I255 Ischemic cardiomyopathy: Secondary | ICD-10-CM | POA: Diagnosis present

## 2021-11-21 DIAGNOSIS — Z811 Family history of alcohol abuse and dependence: Secondary | ICD-10-CM

## 2021-11-21 DIAGNOSIS — R54 Age-related physical debility: Secondary | ICD-10-CM | POA: Diagnosis present

## 2021-11-21 DIAGNOSIS — I10 Essential (primary) hypertension: Secondary | ICD-10-CM | POA: Diagnosis not present

## 2021-11-21 DIAGNOSIS — I132 Hypertensive heart and chronic kidney disease with heart failure and with stage 5 chronic kidney disease, or end stage renal disease: Secondary | ICD-10-CM | POA: Diagnosis present

## 2021-11-21 DIAGNOSIS — F32A Depression, unspecified: Secondary | ICD-10-CM | POA: Diagnosis present

## 2021-11-21 DIAGNOSIS — Z91048 Other nonmedicinal substance allergy status: Secondary | ICD-10-CM

## 2021-11-21 DIAGNOSIS — R6889 Other general symptoms and signs: Secondary | ICD-10-CM | POA: Diagnosis not present

## 2021-11-21 DIAGNOSIS — N400 Enlarged prostate without lower urinary tract symptoms: Secondary | ICD-10-CM | POA: Diagnosis present

## 2021-11-21 DIAGNOSIS — Z66 Do not resuscitate: Secondary | ICD-10-CM | POA: Diagnosis present

## 2021-11-21 DIAGNOSIS — J810 Acute pulmonary edema: Secondary | ICD-10-CM

## 2021-11-21 DIAGNOSIS — Z720 Tobacco use: Secondary | ICD-10-CM | POA: Diagnosis present

## 2021-11-21 DIAGNOSIS — N186 End stage renal disease: Secondary | ICD-10-CM | POA: Diagnosis present

## 2021-11-21 DIAGNOSIS — Z515 Encounter for palliative care: Principal | ICD-10-CM

## 2021-11-21 DIAGNOSIS — I480 Paroxysmal atrial fibrillation: Secondary | ICD-10-CM | POA: Diagnosis present

## 2021-11-21 DIAGNOSIS — E785 Hyperlipidemia, unspecified: Secondary | ICD-10-CM | POA: Diagnosis present

## 2021-11-21 DIAGNOSIS — I4891 Unspecified atrial fibrillation: Secondary | ICD-10-CM | POA: Diagnosis present

## 2021-11-21 DIAGNOSIS — J449 Chronic obstructive pulmonary disease, unspecified: Secondary | ICD-10-CM | POA: Diagnosis present

## 2021-11-21 DIAGNOSIS — R0689 Other abnormalities of breathing: Secondary | ICD-10-CM | POA: Diagnosis not present

## 2021-11-21 DIAGNOSIS — I639 Cerebral infarction, unspecified: Secondary | ICD-10-CM | POA: Diagnosis present

## 2021-11-21 DIAGNOSIS — Z7982 Long term (current) use of aspirin: Secondary | ICD-10-CM

## 2021-11-21 DIAGNOSIS — I7143 Infrarenal abdominal aortic aneurysm, without rupture: Secondary | ICD-10-CM | POA: Diagnosis present

## 2021-11-21 DIAGNOSIS — Z833 Family history of diabetes mellitus: Secondary | ICD-10-CM

## 2021-11-21 DIAGNOSIS — Z8249 Family history of ischemic heart disease and other diseases of the circulatory system: Secondary | ICD-10-CM

## 2021-11-21 LAB — CBC WITH DIFFERENTIAL/PLATELET
Abs Immature Granulocytes: 0.12 10*3/uL — ABNORMAL HIGH (ref 0.00–0.07)
Basophils Absolute: 0 10*3/uL (ref 0.0–0.1)
Basophils Relative: 1 %
Eosinophils Absolute: 0.3 10*3/uL (ref 0.0–0.5)
Eosinophils Relative: 6 %
HCT: 27.8 % — ABNORMAL LOW (ref 39.0–52.0)
Hemoglobin: 8.6 g/dL — ABNORMAL LOW (ref 13.0–17.0)
Immature Granulocytes: 2 %
Lymphocytes Relative: 7 %
Lymphs Abs: 0.3 10*3/uL — ABNORMAL LOW (ref 0.7–4.0)
MCH: 28.6 pg (ref 26.0–34.0)
MCHC: 30.9 g/dL (ref 30.0–36.0)
MCV: 92.4 fL (ref 80.0–100.0)
Monocytes Absolute: 0.4 10*3/uL (ref 0.1–1.0)
Monocytes Relative: 7 %
Neutro Abs: 4 10*3/uL (ref 1.7–7.7)
Neutrophils Relative %: 77 %
Platelets: 129 10*3/uL — ABNORMAL LOW (ref 150–400)
RBC: 3.01 MIL/uL — ABNORMAL LOW (ref 4.22–5.81)
RDW: 19.7 % — ABNORMAL HIGH (ref 11.5–15.5)
WBC: 5.2 10*3/uL (ref 4.0–10.5)
nRBC: 0 % (ref 0.0–0.2)

## 2021-11-21 LAB — COMPREHENSIVE METABOLIC PANEL
ALT: 21 U/L (ref 0–44)
AST: 36 U/L (ref 15–41)
Albumin: 2.2 g/dL — ABNORMAL LOW (ref 3.5–5.0)
Alkaline Phosphatase: 77 U/L (ref 38–126)
Anion gap: 8 (ref 5–15)
BUN: 38 mg/dL — ABNORMAL HIGH (ref 8–23)
CO2: 27 mmol/L (ref 22–32)
Calcium: 8.6 mg/dL — ABNORMAL LOW (ref 8.9–10.3)
Chloride: 103 mmol/L (ref 98–111)
Creatinine, Ser: 6.56 mg/dL — ABNORMAL HIGH (ref 0.61–1.24)
GFR, Estimated: 8 mL/min — ABNORMAL LOW (ref >60)
Glucose, Bld: 103 mg/dL — ABNORMAL HIGH (ref 70–99)
Potassium: 4 mmol/L (ref 3.5–5.1)
Sodium: 138 mmol/L (ref 135–145)
Total Bilirubin: 0.5 mg/dL (ref 0.3–1.2)
Total Protein: 6.3 g/dL — ABNORMAL LOW (ref 6.5–8.1)

## 2021-11-21 LAB — LIPASE, BLOOD: Lipase: 28 U/L (ref 11–51)

## 2021-11-21 LAB — T4, FREE: Free T4: 0.5 ng/dL — ABNORMAL LOW (ref 0.61–1.12)

## 2021-11-21 LAB — TSH: TSH: 12.481 u[IU]/mL — ABNORMAL HIGH (ref 0.350–4.500)

## 2021-11-21 LAB — BLOOD GAS, ARTERIAL
Acid-Base Excess: 2.4 mmol/L — ABNORMAL HIGH (ref 0.0–2.0)
Bicarbonate: 27.2 mmol/L (ref 20.0–28.0)
FIO2: 23 %
O2 Saturation: 97.5 %
Patient temperature: 37
pCO2 arterial: 42 mmHg (ref 32–48)
pH, Arterial: 7.42 (ref 7.35–7.45)
pO2, Arterial: 78 mmHg — ABNORMAL LOW (ref 83–108)

## 2021-11-21 LAB — AMMONIA: Ammonia: 16 umol/L (ref 9–35)

## 2021-11-21 LAB — TROPONIN I (HIGH SENSITIVITY): Troponin I (High Sensitivity): 29 ng/L — ABNORMAL HIGH (ref <18)

## 2021-11-21 LAB — CBG MONITORING, ED: Glucose-Capillary: 95 mg/dL (ref 70–99)

## 2021-11-21 LAB — PROCALCITONIN: Procalcitonin: 1 ng/mL

## 2021-11-21 LAB — BRAIN NATRIURETIC PEPTIDE: B Natriuretic Peptide: 1551.6 pg/mL — ABNORMAL HIGH (ref 0.0–100.0)

## 2021-11-21 MED ORDER — DIPHENHYDRAMINE HCL 50 MG/ML IJ SOLN: 12.5000 mg | INTRAMUSCULAR | Status: DC | PRN

## 2021-11-21 MED ORDER — ACETAMINOPHEN 325 MG RE SUPP: 650.0000 mg | Freq: Four times a day (QID) | RECTAL | Status: DC | PRN

## 2021-11-21 MED ORDER — HYDRALAZINE HCL 20 MG/ML IJ SOLN: 5.0000 mg | Freq: Four times a day (QID) | INTRAMUSCULAR | Status: DC | PRN

## 2021-11-21 MED ORDER — ACETAMINOPHEN 325 MG PO TABS
650.0000 mg | ORAL_TABLET | Freq: Four times a day (QID) | ORAL | Status: DC | PRN
Start: 2021-11-21 — End: 2021-11-23

## 2021-11-21 MED ORDER — ALBUTEROL SULFATE (2.5 MG/3ML) 0.083% IN NEBU: 2.5000 mg | INHALATION_SOLUTION | Freq: Four times a day (QID) | RESPIRATORY_TRACT | Status: DC | PRN

## 2021-11-21 MED ORDER — CEFTRIAXONE IV (FOR PTA / DISCHARGE USE ONLY)
2.0000 g | INTRAVENOUS | Status: DC
Start: 2021-11-21 — End: 2021-11-21

## 2021-11-21 MED ORDER — LORAZEPAM 2 MG/ML IJ SOLN
1.0000 mg | INTRAMUSCULAR | Status: DC | PRN
  Administered 2021-11-21: 1 mg via INTRAVENOUS
  Filled 2021-11-21: qty 1

## 2021-11-21 MED ORDER — ONDANSETRON 4 MG PO TBDP: 4.0000 mg | ORAL_TABLET | Freq: Four times a day (QID) | ORAL | Status: DC | PRN

## 2021-11-21 MED ORDER — HALOPERIDOL LACTATE 2 MG/ML PO CONC
0.5000 mg | ORAL | Status: DC | PRN
  Filled 2021-11-21: qty 5

## 2021-11-21 MED ORDER — HYDROMORPHONE HCL 1 MG/ML IJ SOLN
0.5000 mg | INTRAMUSCULAR | Status: DC | PRN
  Administered 2021-11-22: 0.5 mg via INTRAVENOUS
  Filled 2021-11-21: qty 1

## 2021-11-21 MED ORDER — BISACODYL 10 MG RE SUPP
10.0000 mg | Freq: Every day | RECTAL | Status: DC | PRN
  Filled 2021-11-21: qty 1

## 2021-11-21 MED ORDER — LOPERAMIDE HCL 2 MG PO CAPS: 2.0000 mg | ORAL_CAPSULE | ORAL | Status: DC | PRN

## 2021-11-21 MED ORDER — ONDANSETRON HCL 4 MG/2ML IJ SOLN
4.0000 mg | Freq: Once | INTRAMUSCULAR | Status: AC
  Administered 2021-11-21: 4 mg via INTRAVENOUS
  Filled 2021-11-21: qty 2

## 2021-11-21 MED ORDER — GLYCOPYRROLATE 0.2 MG/ML IJ SOLN
0.2000 mg | INTRAMUSCULAR | Status: DC | PRN
  Filled 2021-11-21: qty 1

## 2021-11-21 MED ORDER — FUROSEMIDE 10 MG/ML IJ SOLN
40.0000 mg | Freq: Once | INTRAMUSCULAR | Status: DC
  Filled 2021-11-21: qty 4

## 2021-11-21 MED ORDER — GLYCOPYRROLATE 0.2 MG/ML IJ SOLN
0.2000 mg | INTRAMUSCULAR | Status: DC | PRN
  Administered 2021-11-21: 0.2 mg via INTRAVENOUS
  Filled 2021-11-21 (×2): qty 1

## 2021-11-21 MED ORDER — DAPTOMYCIN IV (FOR PTA / DISCHARGE USE ONLY): 650.0000 mg | INTRAVENOUS | Status: DC

## 2021-11-21 MED ORDER — TRAZODONE HCL 50 MG PO TABS: 25.0000 mg | ORAL_TABLET | Freq: Every evening | ORAL | Status: DC | PRN

## 2021-11-21 MED ORDER — MORPHINE SULFATE (CONCENTRATE) 10 MG/0.5ML PO SOLN: 5.0000 mg | ORAL | Status: DC | PRN

## 2021-11-21 MED ORDER — ONDANSETRON HCL 4 MG/2ML IJ SOLN: 4.0000 mg | Freq: Four times a day (QID) | INTRAMUSCULAR | Status: DC | PRN

## 2021-11-21 MED ORDER — ALBUTEROL SULFATE (2.5 MG/3ML) 0.083% IN NEBU: 2.5000 mg | INHALATION_SOLUTION | RESPIRATORY_TRACT | Status: DC | PRN

## 2021-11-21 MED ORDER — POLYVINYL ALCOHOL 1.4 % OP SOLN: 1.0000 [drp] | Freq: Four times a day (QID) | OPHTHALMIC | Status: DC | PRN

## 2021-11-21 MED ORDER — ACETAMINOPHEN 650 MG RE SUPP: 650.0000 mg | Freq: Four times a day (QID) | RECTAL | Status: DC | PRN

## 2021-11-21 MED ORDER — SODIUM CHLORIDE 0.9 % IV SOLN
12.5000 mg | Freq: Four times a day (QID) | INTRAVENOUS | Status: DC | PRN
  Filled 2021-11-21: qty 0.5

## 2021-11-21 MED ORDER — UMECLIDINIUM BROMIDE 62.5 MCG/ACT IN AEPB
1.0000 | INHALATION_SPRAY | Freq: Every day | RESPIRATORY_TRACT | Status: DC
Start: 2021-11-21 — End: 2021-11-23
  Filled 2021-11-21: qty 7

## 2021-11-21 MED ORDER — BUDESON-GLYCOPYRROL-FORMOTEROL 160-9-4.8 MCG/ACT IN AERO
2.0000 | INHALATION_SPRAY | Freq: Two times a day (BID) | RESPIRATORY_TRACT | Status: DC
Start: 2021-11-21 — End: 2021-11-21

## 2021-11-21 MED ORDER — FLUTICASONE FUROATE-VILANTEROL 200-25 MCG/ACT IN AEPB
1.0000 | INHALATION_SPRAY | Freq: Every day | RESPIRATORY_TRACT | Status: DC
  Filled 2021-11-21: qty 28

## 2021-11-21 MED ORDER — HEPARIN SODIUM (PORCINE) 5000 UNIT/ML IJ SOLN: 5000.0000 [IU] | Freq: Three times a day (TID) | INTRAMUSCULAR | Status: DC

## 2021-11-21 MED ORDER — HALOPERIDOL 0.5 MG PO TABS
0.5000 mg | ORAL_TABLET | ORAL | Status: DC | PRN
  Filled 2021-11-21: qty 1

## 2021-11-21 MED ORDER — HALOPERIDOL LACTATE 5 MG/ML IJ SOLN: 0.5000 mg | INTRAMUSCULAR | Status: DC | PRN

## 2021-11-21 MED ORDER — GLYCOPYRROLATE 1 MG PO TABS
1.0000 mg | ORAL_TABLET | ORAL | Status: DC | PRN
  Filled 2021-11-21: qty 1

## 2021-11-21 MED ORDER — PANTOPRAZOLE SODIUM 40 MG IV SOLR: 40.0000 mg | Freq: Every day | INTRAVENOUS | Status: DC | PRN

## 2021-11-21 MED ORDER — POLYETHYLENE GLYCOL 3350 17 G PO PACK: 17.0000 g | PACK | Freq: Every day | ORAL | Status: DC | PRN

## 2021-11-21 MED ORDER — SENNA 8.6 MG PO TABS: 1.0000 | ORAL_TABLET | Freq: Every evening | ORAL | Status: DC | PRN

## 2021-11-21 MED ORDER — ACETAMINOPHEN 325 MG PO TABS: 650.0000 mg | ORAL_TABLET | Freq: Four times a day (QID) | ORAL | Status: DC | PRN

## 2021-11-21 MED ORDER — VANCOMYCIN HCL 1500 MG/300ML IV SOLN
1500.0000 mg | Freq: Once | INTRAVENOUS | Status: DC
Start: 2021-11-21 — End: 2021-11-21
  Filled 2021-11-21: qty 300

## 2021-11-21 MED ORDER — SODIUM CHLORIDE 0.9 % IV SOLN
2.0000 g | Freq: Once | INTRAVENOUS | Status: DC
  Filled 2021-11-21: qty 12.5

## 2021-11-21 MED ORDER — ONDANSETRON HCL 4 MG PO TABS: 4.0000 mg | ORAL_TABLET | Freq: Four times a day (QID) | ORAL | Status: DC | PRN

## 2021-11-21 MED ORDER — BIOTENE DRY MOUTH MT LIQD
15.0000 mL | OROMUCOSAL | Status: DC | PRN
  Filled 2021-11-21: qty 15

## 2021-11-21 NOTE — Assessment & Plan Note (Signed)
-   Patient currently not taking due to IV daptomycin ?- Not resumed due to comfort measures only ?

## 2021-11-21 NOTE — ED Notes (Signed)
EDP in room discussing care with pt and wife. ?

## 2021-11-21 NOTE — ED Notes (Signed)
This RN attempted [facility] An x 2 without success, EDP and charge nurse notified.  ?

## 2021-11-21 NOTE — Progress Notes (Signed)
PHARMACY -  BRIEF ANTIBIOTIC NOTE  ? ?Pharmacy has received consult(s) for vancomycin and cefepime from an ED provider.  The patient's profile has been reviewed for ht/wt/allergies/indication/available labs.   ? ?One time order(s) placed for vancomycin 1500 mg x 1, cefepime 2 grams x 1 ? ?Further antibiotics/pharmacy consults should be ordered by admitting physician if indicated.       ?                ?Thank you, ?Wynelle Cleveland, PharmD ?Pharmacy Resident  ?11/21/2021 ?8:05 AM ? ? ?

## 2021-11-21 NOTE — Assessment & Plan Note (Addendum)
-   Etiology work-up in progress ?- Differentials include missing hemodialysis session versus progression of multiple chronic medical diagnosis including multiple areas of encephalomalacia in the brain, end-stage renal disease on hemodialysis, current prosthetic infection of the hip ?- We will follow with blood cultures x2, procalcitonin, BNP, and added lactic acid x2 ?

## 2021-11-21 NOTE — Assessment & Plan Note (Signed)
-   EDP has placed a consult for palliative care ?

## 2021-11-21 NOTE — TOC Initial Note (Addendum)
Transition of Care (TOC) - Initial/Assessment Note  ? ? ?Patient Details  ?Name: Richard Wiggins ?MRN: [id number] ?Date of Birth: 1950-12-25 ? ?Transition of Care (TOC) CM/SW Contact:    ?[place], LCSWA ?Phone Number: ?11/21/2021, 11:02 AM ? ?Clinical Narrative:                 ? ?TOC consult for outpatient hospice care.  ? ?11:02am: CSW reached out to [facility] with [facility] and awaiting confirmation.  ? ?12:33pm: CSW was able to speak to [facility] staff and they reported they would reach out to the patient's spouse. ? ?No further TOC needs at this time. ? ? ? ?  ? ? ?Patient Goals and CMS Choice ?  ?  ?  ? ?Expected Discharge Plan and Services ?  ?  ?  ?  ?  ?                ?  ?  ?  ?  ?  ?  ?  ?  ?  ?  ? ?Prior Living Arrangements/Services ?  ?  ?  ?       ?  ?  ?  ?  ? ?Activities of Daily Living ?  ?  ? ?Permission Sought/Granted ?  ?  ?   ?   ?   ?   ? ?Emotional Assessment ?  ?  ?  ?  ?  ?  ? ?Admission diagnosis:  Altered mental status [R41.82] ?End of life care [Z51.5] ?Patient Active Problem List  ? Diagnosis Date Noted  ? Altered mental status 11/21/2021  ? Failure to thrive in adult 11/21/2021  ? End of life care 11/21/2021  ? History of tobacco abuse 11/21/2021  ? Counseling regarding advance care planning and goals of care 11/10/2021  ? Septic arthritis of hip ([place]) 11/05/2021  ? Dyslipidemia 11/05/2021  ? Paroxysmal atrial fibrillation ([place]) 11/05/2021  ? Acute kidney injury superimposed on chronic kidney disease ([place]) likely ATN/prerenal azotemia d/t poor po intake and nephrotoxic Rx  11/05/2021  ? Acute metabolic encephalopathy 24/03/7352  ? Acute lower UTI 11/05/2021  ? Infection of prosthetic total hip joint, initial encounter ([place]) 10/16/2021  ? Dehydration   ? Epistaxis 09/23/2021  ? Normocytic anemia 09/23/2021  ? Lung cancer ([place]) 08/18/2021  ? Renal artery stenosis ([place]) 08/11/2021  ? CKD (chronic kidney disease), stage III ([place]) 03/11/2021  ? 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 Iron deficiency anemia 02/04/2021  ? Long term (current) use of anticoagulants 11/12/2020  ? Positive colorectal cancer screening using Cologuard test 05/22/2020  ? Goals of care, counseling/discussion 05/06/2020  ? Malignant neoplasm of upper lobe of left lung ([place]) 05/06/2020  ? Preventative health care 03/26/2020  ? Atherosclerosis of native arteries of extremity with intermittent claudication ([place]) 03/14/2020  ? Trigger middle finger of right hand 02/20/2020  ? Abdominal aortic aneurysm (AAA) without rupture ([place]) 02/20/2020  ? 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  ? Benign essential HTN 02/10/2020  ? Pre-diabetes 02/10/2020  ? BPH (benign prostatic hyperplasia) 02/09/2020  ? Pancytopenia ([place]) 02/09/2020  ? Weakness of lower extremity 02/01/2020  ? COPD (chronic obstructive pulmonary disease) ([place]) 02/01/2020  ? Chronic systolic CHF (congestive heart failure) ([place]) 02/01/2020  ? Depression 02/01/2020  ? A-fib ([place]) 02/01/2020  ? CVA (cerebral vascular accident) ([place]) 02/01/2020  ? Coronary artery disease 02/01/2020  ? Carotid artery stenosis 02/01/2020  ? HLD (hyperlipidemia) 02/01/2020  ? Carotid stenosis,  asymptomatic, left 02/14/2018  ? S/P coronary artery stent placement 09/20/2017  ? Centrilobular emphysema ([place]) 06/10/2017  ? Chronic back pain 06/10/2017  ? DOE (dyspnea on exertion) 06/10/2017  ? Elevated TSH 06/10/2017  ? Hematoma of groin 06/10/2017  ? PAD (peripheral artery disease) ([place]) 06/10/2017  ? Pulmonary embolism ([place]) 06/10/2017  ? Tobacco abuse 06/10/2017  ? Unintended weight loss 06/10/2017  ? AAA (abdominal aortic aneurysm) ([place]) 06/10/2017  ? PAF (paroxysmal atrial fibrillation) ([place]) 06/10/2017  ? Systolic CHF, chronic ([place]) 06/10/2017  ? Hypertension 05/31/2017  ? Other hyperlipidemia 05/31/2017  ? Hemiparesis affecting left side as late effect of cerebrovascular accident ([place]) 05/29/2017  ? Carotid artery stenosis, symptomatic, right 05/29/2017  ?  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 ?PCP:  [facility] ?Pharmacy:   ?[facility], [address] ?[place] ?[place] [address] ?Phone: [phone] Fax: [phone] ? ?[facility] [phone] - [place], [address] ?[place] [address] ?Phone: [phone] Fax: [phone] ? ? ? ? ?Social Determinants of Health (SDOH) Interventions ?  ? ?Readmission Risk Interventions ? ?  11/09/2021  ? 12:19 PM  ?Readmission Risk Prevention Plan  ?Transportation Screening Complete  ?Medication Review Press photographer) Complete  ?PCP or Specialist appointment within 3-5 days of discharge Complete  ?[place] or Home Care Consult Complete  ?SW Recovery Care/Counseling Consult Complete  ?Palliative Care Screening Not Applicable  ?[place] Not Applicable  ? ? ? ?

## 2021-11-21 NOTE — Assessment & Plan Note (Addendum)
-   Patient presenting with apparent failure to thrive with altered mental status, etiology was not worked up due to patient's family requesting comfort measures only ?- After extensive discussion with spouse/HPOA, spouse has elected to proceed with comfort measures only; greater than 30 minutes spent at bedside with spouse to discuss goals of care/advance planning ?- Comfort care order set utilized ?- TOC been consulted for considerations of outpatient and inpatient hospice ?- Spouse states that she does not have the help for home hospice/home comfort measures at this time ?- No antibiotics or IVF as per family's request ?- As needed medications include pain control with morphine as needed, would transition to a drip if needed but patient does not appear to be in pain at this time ?- RN can pronounce death ?

## 2021-11-21 NOTE — ED Notes (Signed)
Dr. Tobie Poet notified of pt refusals of I/o cath for urine, and rectal temp, and that RN unable to collect blood cultures. Waiting on orders regarding antibiotics.  ?

## 2021-11-21 NOTE — ED Notes (Signed)
Pt refused rectal temp, pt wife and POA confirmed to RN to follow pt's wishes.  ?

## 2021-11-21 NOTE — H&P (Addendum)
?History and Physical  ? ?Richard Wiggins SJG:[id number] DOB: 10/13/1950 DOA: 11/21/2021 ? ?PCP: [facility]  ?Outpatient Specialists: Dr. Patsey Berthold, pulmonology ?Patient coming from: home via EMS ? ?I have personally briefly reviewed patient's old medical records in [place]. ? ?Chief Concern: Altered mental status ? ?HPI: 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 ? ?Vitals in the emergency department showed temperature of 97.9, respiration rate initially of 19 and improved to 10, heart rate of 79, blood pressure 189/93, SPO2 of 97% on room air. ? ?Serum sodium is 138, potassium 4.0, chloride 103, bicarb 27, BUN of 38, serum creatinine of 6.56, nonfasting blood glucose 103, GFR of 8. ? ?WBC 5.2, hemoglobin 8.6, platelets of 129. ? ?TSH was 12.481. ? ?Procalcitonin is in process.  BNP is in process.  Blood cultures x2 have been ordered and needs collection.  ABG has been ordered and needs collection. ? ?High sensitive troponin is 29.  Lipase is within normal  limits.  Ammonia is 16. ? ?ED treatment: Ondansetron 4 mg IV. ? ?He was able to tell me his full name and his age. He was not able to tell me the current year or the location.  He continued to say 27.  He was able to identify his spouse, Richard Wiggins at bedside.  He was not able to tell me why he is in the emergency department.  He appears pale, weak, chronically ill, lethargic.  He frequently closes his eyes in the middle of sentences as if he is too tired to finish his thought. ? ?I had a long discussion with Richard Wiggins. ? ?She reports that approximately 4:30 AM on Friday, 11/20/2021, patient has been experiencing increased confusion.  Richard Wiggins was concerned that he would fall that she felt she could not leave the room.  He tried to take out his oxygen, he tried to get out of the bed however he was too weak.  He continued to tell her that he needed to urinate but was not able to.  She states that he was increasingly agitated due to the fear that she would take a plane somewhere and leave him.  She reassured him several times however he continued to be agitated and fearful that she would leave him.  He also states that he is dreaming while his eyes were open and talking to her.  He states that he is a thief and trying to steal something.  He also stated several times that he sees his mother and brother who are both deceased. ? ?She reports no fever, no complaint of acute pain, no vomiting while he was at home. ? ?She reports that he he was discharged on  5/11 with scheduled dialysis on Tuesday, Thursday, Saturday.  She states that he missed Saturday dialysis because she was to arrive at the dialysis clinic and needed to sign the form.  She was not able to leave his bedside due to fear that he would fall and due to his altered mental status and agitation.  Therefore he did not get his outpatient dialysis on day of presentation.  He was advised by the dialysis clinic, to call EMS should he worsen so that he could be evaluated in the  emergency department. ? ?She states that she tried to keep his O2 supplementation on at all time for fear that without oxygen patient would have worsening confusion. ? ?Nursing staff updated that patient has refused rectal temperature, I and O catheterization for urine collection as patient continues to state that he needs to urinate however is not able to produce urine.  He has refused needlesticks further and nursing was not able to successfully obtain blood cultures.  He is also not wearing his oxygen via nasal cannula and continues to pull at lines and leads. ? ?Richard Wiggins was formally the COO of a hospice facility.  She states that at this time she recognizes that patient needs comfort measures.  He attempts to pull at lines and has refused in and out catheterization to assess his urine.  I also offered to consult palliative care, and Richard Wiggins declines stating that she feels patient is ready for comfort measures at this time. ? ?Social history: He lives at home with his wife, Richard Wiggins of 29 years.  A former tobacco user quitting in November 2018. ? ?Vaccination history: He is vaccinated for COVID-19 with 3 doses of Moderna.  He has received his pneumococcal 23 on 06/02/2017.  He is not up-to-date on influenza.  Last influenza dose was 04/23/2019. ? ?ROS: Unable to complete as patient is altered ? ?ED Course: Discussed with emergency medicine provider, patient requiring hospitalization for chief concerns of altered mental status.  Spouse at bedside considering inpatient versus outpatient hospice as patient has failure to thrive ? ?Assessment/Plan ? ?Principal Problem: ?  End of life care ?Active Problems: ?  Acute kidney injury superimposed on chronic kidney disease ([place]) likely ATN/prerenal azotemia d/t poor po intake and nephrotoxic Rx  ?  Acute metabolic encephalopathy ?  COPD (chronic obstructive pulmonary disease) ([place]) ?  Depression ?  A-fib ([place]) ?  CVA (cerebral vascular accident) [facility]) ?  Coronary artery disease ?   HLD (hyperlipidemia) ?  Benign essential HTN ?  Centrilobular emphysema ([place]) ?  PAD (peripheral artery disease) ([place]) ?  S/P coronary artery stent placement ?  Tobacco abuse ?  AAA (abdominal aortic aneurysm) ([place]) ?  PAF (paroxysmal atrial fibrillation) ([place]) ?  Systolic CHF, chronic ([place]) ?  Normocytic anemia ?  Infection of prosthetic total hip joint, initial encounter ([place]) ?  Dyslipidemia ?  Altered mental status ?  Failure to thrive in adult ?  History of tobacco abuse ?  ?Assessment and Plan: ? ?* End of life care ?- Patient presenting with apparent failure to thrive with altered mental status, etiology was not worked up due to patient's family requesting comfort measures only ?- After extensive discussion with spouse/HPOA, spouse has elected to proceed with comfort measures only; greater than 30 minutes spent at bedside with spouse to discuss goals of care/advance planning ?- Comfort care order set utilized ?- TOC been consulted for considerations of outpatient and inpatient hospice ?- Spouse states that she does not  have the help for home hospice/home comfort measures at this time ?- No antibiotics or IVF as per family's request ?- As needed medications include pain control with morphine as needed, would transition to a drip if needed but patient does not appear to be in pain at this time ?- RN can pronounce death ? ?Acute kidney injury superimposed on chronic kidney disease ([place]) likely ATN/prerenal azotemia d/t poor po intake and nephrotoxic Rx  ?- Currently on hemodialysis Tuesday, Thursday, Saturday ?- Spouse declining further hemodialysis at this time ? ?Acute metabolic encephalopathy ?- Etiology work-up in progress ?- Differentials include missing hemodialysis session versus progression of multiple chronic medical diagnosis including multiple areas of encephalomalacia in the brain, end-stage renal disease on hemodialysis, current prosthetic infection of the hip ?- We will follow with blood cultures  x2, procalcitonin, BNP, and added lactic acid x2 ? ?History of tobacco abuse ?- Patient quit in 2018 ? ?Failure to thrive in adult ?- EDP has placed a consult for palliative care ? ?PAF (paroxysmal atri

## 2021-11-21 NOTE — Hospital Course (Addendum)
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 ? ?Vitals in the emergency department showed temperature of 97.9, respiration rate initially of 19 and improved to 10, heart rate of 79, blood pressure 189/93, SPO2 of 97% on room air. ? ?Serum sodium is 138, potassium 4.0, chloride 103, bicarb 27, BUN of 38, serum creatinine of 6.56, nonfasting blood glucose 103, GFR of 8. ? ?WBC 5.2, hemoglobin 8.6, platelets of 129. ? ?TSH was 12.481. ? ?Procalcitonin is in process.  BNP is in process.  Blood cultures x2 have been ordered and needs collection.  ABG has been ordered and needs collection. ? ?High sensitive troponin is 29.  Lipase is within normal limits.  Ammonia is 16. ? ?ED treatment: Ondansetron 4 mg IV. ?

## 2021-11-21 NOTE — ED Notes (Signed)
Pt states he feels like he needs to urinate, but per wife, pt has not been able to urinate for several days. Bladder scan performed, 185 ml noted on bladder scan. Pt refused in and out cath, per wife/POA, RN to follow pt's wishes. EDP in room during this time. ?

## 2021-11-21 NOTE — ED Notes (Signed)
RT called to collect blood gas ?

## 2021-11-21 NOTE — ED Notes (Signed)
Family came to let tech know that pt is really agitated and would like for him to have his  prn medicine when it is possible. Pt is trying to get out of bed, but can be redirected for the time and then repeats. RN was notified of what's going on and family request   ?

## 2021-11-21 NOTE — Assessment & Plan Note (Signed)
-   Currently on hemodialysis Tuesday, Thursday, Saturday ?- Spouse declining further hemodialysis at this time ?

## 2021-11-21 NOTE — ED Provider Notes (Signed)
? ?[facility] ?Provider Note ? ? ? Event Date/Time  ? First MD Initiated Contact with Patient 11/21/21 [phone]   ?  (approximate) ? ? ?History  ? ?Altered Mental Status ? ? ?HPI ? ?Richard Wiggins is a 71 y.o. male with history of previous cerebral aneurysm status post clipping, lung cancer (last chemo on March 1), heart failure, chronic kidney disease, COPD on 2 L of oxygen, CAD, AAA, A-fib on Eliquis, previous CVA, seizures, end-stage renal disease on hemodialysis Monday, Wednesday and Friday that just recently started at the end of April who presents to the emergency department EMS for altered mental status.  Patient is currently receiving Rocephin and daptomycin for a left infected prosthetic hip.  She reports he has also been recently diagnosed with pneumonia and has been on prednisone for several days.  Wife reports patient was last dialyzed on Wednesday.  He was supposed to be dialyzed yesterday but this did not happen due to him being confused over the past 2 days.  She states he was just discharged from a 2-week hospitalization.  She states that he had improved but was not back to his baseline.  She states he has had progressive decline over the past several weeks.  She denies any recent falls.  Patient denies any pain.  He is actively dry heaving here.  No known vomiting or diarrhea at home.  She states he was up all night agitated and asking for her to take him to the bathroom to pee. ? ? ? ?History provided by EMS and patient's wife. ? ? ? ?Past Medical History:  ?Diagnosis Date  ? AAA (abdominal aortic aneurysm) ([place]) 2020  ? a. 2020 U/S: 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 -plan medical managmment with tight bp control; b. 09/2020 Abd U/S: 4.1cm.  ? Anxiety   ? Aortic atherosclerosis ([place])   ? Benign essential HTN 02/10/2020  ? BPH (benign prostatic hyperplasia) 02/09/2020  ? Brain aneurysm   ? Cancer of upper lobe of left lung ([place]) 04/2020  ? a.) ENB/EBUS performed; Bx non-diagnostic. b.)  presumed stage I NSCLC in the LUL; underwent SBRT (60 Gy over 5 fractions)  ? Carotid arterial disease ([place])   ? a. 05/2017 s/p R carotid endarterectomy following CVA.  ? 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 a. 2018 Echo: EF 30-35%; b. 02/2021 Echo: EF 55-60%, nl RV fxn. Triv MR. Asc Ao 41m.  ? CKD (chronic kidney disease), stage III ([place]   ? Claudication ([facility]   ? a. 09/2020 ABIs: R 0.91. L 0.99.  ? COPD (chronic obstructive pulmonary disease) ([place]   ? Coronary artery disease   ? a. 06/2017 PCI ([place]): s/p PCI to the RCA. LAD 50, LCX 20.  ? Depression   ? 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 a. 02/2021 Asc Ao 361m  ? Emphysema of lung ([place]  ? History of blood transfusion   ? HLD (hyperlipidemia)   ? IDA (iron deficiency anemia) 02/09/2020  ? 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  ? Inguinal hernia   ? right  ? Ischemic cardiomyopathy   ? Long term current use of anticoagulant   ? a.) apixaban  ? Marijuana use, continuous 02/10/2020  ? Nosebleed   ? Osteoporosis   ? PAF (paroxysmal atrial fibrillation) ([place]  ? a. 05/2017 Dx in setting of CVA. CHA2DS2VASc = 6-->Eliquis.  ? Pre-diabetes   ? Pulmonary emboli ([place]/27/2018  ?  Right lower lobe pulmonary embolism small segmental, multifocal multifocal pneumonia, mediastinal lymphadenopathy, moderate centrilobular emphysema  ? Seizures ([place])   ? Sigmoid diverticulosis   ? a.) CT 01/27/2021: severe  ? Stroke [facility])   ? a. 05/2017 - hospitalized in [place]->prolonged hospitalization in setting of R CEA, PE, and finding of RCA dzs on cath; b. Residual left sided weakness to arm and leg.  ? ? ?Past Surgical History:  ?Procedure Laterality Date  ? brain aneurysm with clip    ? COLONOSCOPY WITH PROPOFOL N/A 07/23/2020  ? Procedure: COLONOSCOPY WITH PROPOFOL;  Surgeon: Lin Landsman, MD;  Location: [facility] ENDOSCOPY;  Service: Gastroenterology;  Laterality: N/A;  ? CORONARY ANGIOPLASTY WITH STENT PLACEMENT  07/08/2017   ? DIALYSIS/PERMA CATHETER INSERTION N/A 11/10/2021  ? Procedure: DIALYSIS/PERMA CATHETER INSERTION;  Surgeon: Katha Cabal, MD;  Location: [facility] CV LAB;  Service: Cardiovascular;  Laterality: N/A;  ? INCISION AND DRAINAGE HIP Right 10/16/2021  ? Procedure: IRRIGATION AND DEBRIDEMENT OF INFECTED TOTAL HIP;  Surgeon: Hessie Knows, MD;  Location: [facility] ORS;  Service: Orthopedics;  Laterality: Right;  ? PORTA CATH INSERTION N/A 08/31/2021  ? Procedure: PORTA CATH INSERTION;  Surgeon: Algernon Huxley, MD;  Location: [facility] CV LAB;  Service: Cardiovascular;  Laterality: N/A;  ? TOTAL HIP ARTHROPLASTY Right   ? VIDEO BRONCHOSCOPY WITH ENDOBRONCHIAL NAVIGATION N/A 04/23/2020  ? Procedure: VIDEO BRONCHOSCOPY WITH ENDOBRONCHIAL NAVIGATION;  Surgeon: Tyler Pita, MD;  Location: [facility] ORS;  Service: Pulmonary;  Laterality: N/A;  ? VIDEO BRONCHOSCOPY WITH ENDOBRONCHIAL ULTRASOUND Left 08/12/2021  ? Procedure: VIDEO BRONCHOSCOPY WITH ENDOBRONCHIAL ULTRASOUND;  Surgeon: Tyler Pita, MD;  Location: [facility] ORS;  Service: Cardiopulmonary;  Laterality: Left;  ? ? ?MEDICATIONS:  ?Prior to Admission medications   ?Medication Sig Start Date End Date Taking? Authorizing Provider  ?albuterol (PROVENTIL) (2.5 MG/3ML) 0.083% nebulizer solution Take 3 mLs (2.5 mg total) by nebulization every 6 (six) hours as needed for wheezing or shortness of breath. 10/13/20   Tyler Pita, MD  ?albuterol (VENTOLIN HFA) 108 (90 Base) MCG/ACT inhaler INHALE 2 PUFFS INTO THE LUNGS EVERY 6 HOURS AS NEEDED FOR WHEEZING OR SHORTNESS OF BREATH 09/30/21   Tyler Pita, MD  ?amLODipine (NORVASC) 5 MG tablet Take 1 tablet (5 mg total) by mouth every evening. 11/19/21   Fritzi Mandes, MD  ?apixaban (ELIQUIS) 5 MG TABS tablet TAKE 1 TABLET(5 MG) BY MOUTH IN THE MORNING AND AT BEDTIME 08/20/21   Kate Sable, MD  ?aspirin 81 MG chewable tablet CHEW ONE TABLET BY MOUTH DAILY FOR HEART 09/11/21   [provider]  ?atorvastatin  (LIPITOR) 40 MG tablet HOLD for now and resume taking after completing IV daptomycin 11/19/21   Fritzi Mandes, MD  ?Budeson-Glycopyrrol-Formoterol (BREZTRI AEROSPHERE) 160-9-4.8 MCG/ACT AERO Inhale 2 puffs into the lungs in the morning and at bedtime. 12/30/20   Tyler Pita, MD  ?carvedilol (COREG) 25 MG tablet Take 1 tablet (25 mg total) by mouth in the morning and at bedtime. 11/19/21   Fritzi Mandes, MD  ?cefTRIAXone (ROCEPHIN) IVPB Inject 2 g into the vein daily for 7 days. Indication:  prosthetic hip infection (culture negative) ?First Dose: Yes ?Last Day of Therapy:  11/26/2021 ?Labs - Once weekly:  CBC/D, CMP, ESR, CRP and CPK ?Fax weekly labs to [phone] ?Method of administration: IV Push ?Method of administration may be changed at the discretion of home infusion pharmacist based upon assessment of the patient and/or caregiver's ability to self-administer the medication  ordered. 11/19/21 11/26/21  Fritzi Mandes, MD  ?Chlorhexidine Gluconate Cloth 2 % PADS Apply 6 each topically daily. ?Patient not taking: Reported on 10/29/2021 10/21/21   Duanne Guess, PA-C  ?daptomycin (CUBICIN) IVPB Inject 650 mg into the vein every other day for 6 days. Dose due 5/11, 5/13, 5/15, 5/17 ?Indication:  prosthetic hip infection (culture negative) ?First Dose: Yes ?Last Day of Therapy:  11/26/2021 ?Labs - Once weekly:  CBC/D, CMP, ESR, CRP and CPK ?Fax weekly labs to [phone] ?Method of administration: IV Push ?Method of administration may be changed at the discretion of home infusion pharmacist based upon assessment of the patient and/or caregiver's ability to self-administer the medication ordered. 11/19/21 11/25/21  Fritzi Mandes, MD  ?diazepam (VALIUM) 2 MG tablet Take 1 tablet (2 mg total) by mouth every 8 (eight) hours as needed for anxiety. 11/19/21   Fritzi Mandes, MD  ?lidocaine-prilocaine (EMLA) cream Apply to affected area once 08/27/21   Sindy Guadeloupe, MD  ?Multiple Vitamin (MULTI-VITAMIN) tablet Take 1 tablet by  mouth daily.    [provider]  ?ondansetron (ZOFRAN) 8 MG tablet Take 1 tablet (8 mg total) by mouth 2 (two) times daily as needed for refractory nausea / vomiting. Start on day 3 after chemo.

## 2021-11-21 NOTE — Assessment & Plan Note (Signed)
-   Patient quit in 2018 ?

## 2021-11-21 NOTE — Progress Notes (Signed)
[facility]) Hospital Liaison RN note ? ?Received request from [facility] for hospice services at home after discharge. Chart and patient information under review by Hospice physician. Hospice eligibility pending at this time.   ? ?Spoke with wife Butch Penny at bedside to initiate education related to hospice philosophy, services, and team approach to care. Patient/family verbalized understanding of information given. Per discussion, the plan is for discharge home by EMS possibly tomorrow.  ? ?DME needs discussed. Patient has the following equipment in the home. Oxygen. Patient/family requests the following equipment for deliver: hospital bed, overbed table, and transport wheelchair. Address has been verified and is correct in the chart. Butch Penny [phone] is the family contact to arrange time of equipment delivery.  ? ?Please send signed and completed DNR with patient/family. Please provide symptoms at discharge as needed for ongoing symptom management.  ?[facility] information and contact numbers given to Butch Penny. Above information shared with [place].  ?Please call with any hospice related questions or concerns.  ?Thank you for the opportunity to participate in this patient's care.  ?Jhonnie Garner, Therapist, sports, BSN ?[facility] Liaison ?[phone] ?

## 2021-11-21 NOTE — Assessment & Plan Note (Signed)
-   On apixaban 5 mg p.o. twice daily, not resumed as patient is now on comfort measures only ?

## 2021-11-21 NOTE — ED Triage Notes (Addendum)
Pt arrives to ED for increased confusion. Pt normally gets dialysis MWF, states Friday he was unable to go. Has been more ocnfsued since. Pt wears 2 liters o2 at baseline. Lives with wife. Hx stage 4 lung ca.  ?

## 2021-11-21 NOTE — ED Notes (Signed)
Pt arrives with right chest port already accessed. Per Dr. Leonides Schanz, line ok to use for blood work and medication. Line flushes well.  ?

## 2021-11-22 DIAGNOSIS — Z515 Encounter for palliative care: Principal | ICD-10-CM

## 2021-11-22 MED ORDER — LORAZEPAM 1 MG PO TABS: 1.0000 mg | ORAL_TABLET | Freq: Three times a day (TID) | ORAL | 0 refills | Status: DC

## 2021-11-22 MED ORDER — MORPHINE SULFATE (CONCENTRATE) 10 MG/0.5ML PO SOLN: 10.0000 mg | ORAL | 0 refills | Status: AC | PRN

## 2021-11-22 MED ORDER — MORPHINE SULFATE (CONCENTRATE) 10 MG/0.5ML PO SOLN
10.0000 mg | ORAL | 0 refills | Status: DC | PRN
Start: 2021-11-22 — End: 2021-11-22

## 2021-11-22 MED ORDER — CHLORHEXIDINE GLUCONATE CLOTH 2 % EX PADS: 6.0000 | MEDICATED_PAD | Freq: Every day | CUTANEOUS | Status: DC

## 2021-11-22 MED ORDER — MORPHINE SULFATE (CONCENTRATE) 10 MG/0.5ML PO SOLN: 10.0000 mg | ORAL | 0 refills | Status: DC | PRN

## 2021-11-22 MED ORDER — LORAZEPAM 1 MG PO TABS: 1.0000 mg | ORAL_TABLET | Freq: Three times a day (TID) | ORAL | 0 refills | Status: AC | PRN

## 2021-11-22 MED ORDER — LORAZEPAM 2 MG/ML IJ SOLN: 1.0000 mg | INTRAMUSCULAR | 0 refills | Status: AC | PRN

## 2021-11-22 NOTE — Progress Notes (Signed)
Pt discharged via EMS to home. Wife Butch Penny called and updated. Discharge papers given to EMS.  ?

## 2021-11-22 NOTE — Discharge Instructions (Signed)
[facility] ?

## 2021-11-22 NOTE — Progress Notes (Signed)
Still awaiting EMS to pick patient up.  Was told at 1640- EMS truck was in the ED and was picking up patient.  Called EMS back and they stated trucks are tied up running emergency calls.  Asked for Crew Chief to call this RN as initial call for transport was [phone], with call back at [phone] and [phone].   ? ?Butch Penny, patient's wife notified. ? ?Doreatha Lew, RN ?[facility] ?[phone] ?

## 2021-11-22 NOTE — Progress Notes (Signed)
[place] Liaison Note ? ?Follow up on new referral for hospice services at home upon discharge from [facility].  Patient is ready for discharge today.  DME has been scheduled to be delivered, and patient's wife, Butch Penny is on her way home for delivery.   ? ?Admission visit set for 1700 today.   ?Butch Penny has contact information and will call for any needs or concerns. ? ?Will continue to follow through final disposition. ?Please call with any hospice related questions or concerns.  ?Thank you for the opportunity to participate in this patient's care.  ? ?Doreatha Lew, RN ?Manufacturing engineer ?Director of [employer] ?[phone] ?

## 2021-11-22 NOTE — Progress Notes (Signed)
Office manager note ? ?Spoke with patient's wife Butch Penny, who was at the pharmacy to pick up Mr. Brase's medicine.  All equipment in place in the home.  Admission visit set for 1700 today.  Butch Penny has spoken with the admission nurse already.  Butch Penny request I call EMS for transport. ? ?13:50[facility] called for non-urgent transport.  Patient lives in [place], [place] is [place].  Has to be approved by [facility] for transport.  EMS will call this RN back if any issues in transport.   ? ?DNR and discharge paperwork ready for the patient at transport per team at [address] [facility]. ? ?Please call with any questions.  Thank you for allowing participation in Mr. Boutin's care.   ? ?Dimas Aguas, RN ?[facility] ?[phone] ? ?

## 2021-11-22 NOTE — Discharge Summary (Signed)
Physician Discharge Summary   Richard Wiggins  male DOB: July 11, 1951  PVV:[id number]  PCP: [facility]  Admit date: 11/21/2021 Discharge date: 11/22/2021  Admitted From: home Disposition:  home with hospice CODE STATUS: DNR  Discharge Instructions     No wound care   Complete by: As directed       Hospital Course:  For full details, please see H&P, progress notes, consult notes and ancillary notes.  Briefly,  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   * End of life care # Comfort care status - Patient presenting with apparent failure to thrive with altered mental status, etiology was not worked up due to patient's family requesting comfort measures only - After extensive discussion with spouse/HPOA, spouse has elected to proceed with comfort measures only.  Home meds and abx were not resumed. --Wife declined hospice facility placement and wanted to take pt home with hospice.  DME's ordered prior to discharge.    Discharge  Diagnoses:  Principal Problem:   End of life care Active Problems:   Acute kidney injury superimposed on chronic kidney disease ([place]) likely ATN/prerenal azotemia d/t poor po intake and nephrotoxic Rx    Acute metabolic encephalopathy   COPD (chronic obstructive pulmonary disease) (HCC)   Depression   A-fib (HCC)   CVA (cerebral vascular accident) ([place])   Coronary artery disease   HLD (hyperlipidemia)   Benign essential HTN   Centrilobular emphysema (HCC)   PAD (peripheral artery disease) (HCC)   S/P coronary artery stent placement   Tobacco abuse   AAA (abdominal aortic aneurysm) (HCC)   PAF (paroxysmal atrial fibrillation) (HCC)   Systolic CHF, chronic (HCC)   Normocytic anemia   Infection of prosthetic total hip joint, initial encounter ([place])   Dyslipidemia   Altered mental status   Failure to thrive in adult   History of tobacco abuse   30 Day Unplanned Readmission Risk Score    Flowsheet Row ED to Hosp-Admission (Current) from 11/21/2021 in [facility]  30 Day Unplanned Readmission Risk Score (%) 62.25 Filed at 11/22/2021 0801       This score is the patient's risk of an unplanned readmission within 30 days of being discharged (0 -100%). The score is based on dignosis, age, lab data, medications, orders, and past utilization.   Low:  0-14.9   Medium: 15-21.9   High: 22-29.9   Extreme: 30 and above         Discharge Instructions:  Allergies as of 11/22/2021       Reactions   Tape Rash   plastic   Wound Dressing Adhesive Itching, Rash  Gets stuck to skin, makes wound spread        Medication List     STOP taking these medications    albuterol (2.5 MG/3ML) 0.083% nebulizer solution Commonly known as: PROVENTIL   albuterol 108 (90 Base) MCG/ACT inhaler Commonly known as: VENTOLIN HFA   amLODipine 5 MG tablet Commonly known as: NORVASC   apixaban 5 MG Tabs tablet Commonly known as: Eliquis   aspirin 81  MG chewable tablet   atorvastatin 40 MG tablet Commonly known as: LIPITOR   Breztri Aerosphere 160-9-4.8 MCG/ACT Aero Generic drug: Budeson-Glycopyrrol-Formoterol   carvedilol 25 MG tablet Commonly known as: COREG   cefTRIAXone  IVPB Commonly known as: ROCEPHIN   Chlorhexidine Gluconate Cloth 2 % Pads   daptomycin  IVPB Commonly known as: CUBICIN   diazepam 2 MG tablet Commonly known as: VALIUM   Multi-Vitamin tablet   ondansetron 8 MG tablet Commonly known as: Zofran   oxyCODONE 5 MG immediate release tablet Commonly known as: Oxy IR/ROXICODONE   polyethylene glycol 17 g packet Commonly known as: MiraLax   predniSONE 20 MG tablet Commonly known as: DELTASONE   prochlorperazine 10 MG tablet Commonly known as: COMPAZINE   tamsulosin 0.4 MG Caps capsule Commonly known as: FLOMAX   traZODone 50 MG tablet Commonly known as: DESYREL       TAKE these medications    lidocaine-prilocaine cream Commonly known as: EMLA Apply to affected area once   LORazepam 2 MG/ML injection Commonly known as: ATIVAN Inject 0.5 mLs (1 mg total) into the vein every 4 (four) hours as needed for anxiety or sedation.   LORazepam 1 MG tablet Commonly known as: Ativan Take 1 tablet (1 mg total) by mouth every 8 (eight) hours as needed for anxiety or sedation.   morphine CONCENTRATE 10 MG/0.5ML Soln concentrated solution Take 0.5 mLs (10 mg total) by mouth every 2 (two) hours as needed for moderate pain, shortness of breath, severe pain or anxiety (or dyspnea).          Allergies  Allergen Reactions   Tape Rash    plastic   Wound Dressing Adhesive Itching and Rash    Gets stuck to skin, makes wound spread     The results of significant diagnostics from this hospitalization (including imaging, microbiology, ancillary and laboratory) are listed below for reference.   Consultations:   Procedures/Studies: DG Chest 1 View  Result Date: 11/16/2021 CLINICAL DATA:   Inpatient encounter for AKI [id number] EXAM: CHEST  1 VIEW COMPARISON:  Radiograph 10/21/2021 FINDINGS: Unchanged chest port catheter with tip overlying the mid SVC. There is a left approach catheter with 1 tip overlying the distal SVC in the other overlying the right atrium. Unchanged cardiomediastinal silhouette. There is new left mid to lower lung airspace disease and a small left pleural effusion. Right lung is clear. No pneumothorax. No acute osseous abnormality. IMPRESSION: New small left pleural effusion and left mid to lower lung airspace disease which could be atelectasis or pneumonia. Electronically Signed   By: Maurine Simmering M.D.   On: 11/16/2021 13:22   CT HEAD WO CONTRAST (5MM)  Result Date: 11/21/2021 CLINICAL DATA:  Mental status change. EXAM: CT HEAD WITHOUT CONTRAST TECHNIQUE: Contiguous axial images were obtained from the base of the skull through the vertex without intravenous contrast. RADIATION DOSE REDUCTION: This exam was performed according to the departmental dose-optimization program which includes automated exposure control, adjustment of the mA and/or kV according to patient size and/or use of iterative reconstruction  technique. COMPARISON:  11/05/2021 FINDINGS: Brain: No evidence of acute infarction, hemorrhage, hydrocephalus, extra-axial collection or mass lesion/mass effect. Chronic right MCA distribution encephalomalacia is again noted. Right temporal and left frontal lobe encephalomalacia is also again noted and appears unchanged. Lacunar infarct within the left thalamus is unchanged. Prominence of the sulci and ventricles compatible with brain atrophy. Vascular: No hyperdense vessel or unexpected calcification. Skull: Previous left frontal craniotomy. Aneurysm clip is again noted adjacent to the left ICA. No hyperdense vessel is present. Sinuses/Orbits: No acute finding. Other: None. IMPRESSION: 1. No acute intracranial abnormalities. 2. Unchanged remote right MCA encephalomalacia.  Encephalomalacia within the left temporal and right parietal lobe are also unchanged. 3. Chronic left thalamic lacunar infarct. 4. Previous left frontal craniotomy with aneurysm clip adjacent to the left ICA. Electronically Signed   By: Kerby Moors M.D.   On: 11/21/2021 07:42   CT Head Wo Contrast  Result Date: 11/05/2021 CLINICAL DATA:  Mental status change. EXAM: CT HEAD WITHOUT CONTRAST TECHNIQUE: Contiguous axial images were obtained from the base of the skull through the vertex without intravenous contrast. RADIATION DOSE REDUCTION: This exam was performed according to the departmental dose-optimization program which includes automated exposure control, adjustment of the mA and/or kV according to patient size and/or use of iterative reconstruction technique. COMPARISON:  CT head without contrast 02/01/2020 FINDINGS: Brain: Chronic right MCA encephalomalacia again noted. A remote lacunar infarct noted in the left lentiform nucleus. Remote encephalomalacia of the posterior right temporal and occipital lobe is stable. Remote encephalomalacia of the anterior inferior left frontal lobe is stable No acute infarct, hemorrhage, or mass lesion is present. Ex vacuo dilation of the right lateral ventricle noted. Wallerian degeneration noted in the right cerebral peduncle. The brainstem and cerebellum are otherwise within normal limits. Vascular: Left frontal craniotomy noted with stable positioning of aneurysm clip adjacent to the left ICA. Atherosclerotic calcifications are present within the cavernous internal carotid arteries bilaterally. No hyperdense vessel is present. Skull: Left frontal craniotomy noted.  Skull is otherwise intact. Sinuses/Orbits: The paranasal sinuses and mastoid air cells are clear. The globes and orbits are within normal limits. IMPRESSION: 1. No acute intracranial abnormality or significant interval change. 2. Stable remote right MCA encephalomalacia. 3. Stable remote lacunar infarct of  the left lentiform nucleus. 4. Remote encephalomalacia of the anterior inferior left frontal lobe is stable. 5. Left frontal craniotomy with stable positioning of aneurysm clip adjacent to the left ICA. Electronically Signed   By: San Morelle M.D.   On: 11/05/2021 14:53   PERIPHERAL VASCULAR CATHETERIZATION  Result Date: 11/10/2021 See surgical note for result.  DG Chest Portable 1 View  Result Date: 11/21/2021 CLINICAL DATA:  Altered mental status.  Missed dialysis. EXAM: PORTABLE CHEST 1 VIEW COMPARISON:  11/18/2021 FINDINGS: Right chest wall port a catheter tip is in the distal SVC. Left IJ dual lumen catheter is noted with tips in the cavoatrial junction and right atrium. Stable cardiomediastinal contours. Unchanged mild diffuse interstitial edema. Right upper lobe, right midlung and left lower lobe opacities are unchanged from previous exam. The left pleural effusion appears decreased from previous study. IMPRESSION: 1. No change in pulmonary edema pattern. 2. Bilateral airspace opacities which were may represent areas of asymmetric edema or pneumonia. Also unchanged. 3. Decreased left pleural effusion. Electronically Signed   By: Kerby Moors M.D.   On: 11/21/2021 07:17   DG Chest Port 1 View  Result Date: 11/18/2021 CLINICAL DATA:  Pulmonary edema. EXAM: PORTABLE CHEST 1 VIEW  COMPARISON:  Nov 16, 2021. FINDINGS: Stable cardiomediastinal silhouette. Right internal jugular Port-A-Cath is unchanged in position. Left internal jugular dialysis catheter is unchanged. Stable left lung opacity is noted concerning for edema or inflammation with associated pleural effusion. New right midlung opacity is noted concerning for possible inflammation. Bony thorax is unremarkable. IMPRESSION: Stable left lung opacity and pleural effusion is noted as described above. New right midlung opacity is noted concerning for possible pneumonia. Electronically Signed   By: Marijo Conception M.D.   On: 11/18/2021 19:17    CT BONE MARROW BIOPSY & ASPIRATION  Result Date: 11/09/2021 INDICATION: Anemia EXAM: CT GUIDED BONE MARROW ASPIRATES AND BIOPSY MEDICATIONS: None. ANESTHESIA/SEDATION: Fentanyl 50 mcg IV; Versed 1 mg IV Moderate Sedation Time:  10 minutes The patient was continuously monitored during the procedure by the interventional radiology nurse under my direct supervision. COMPLICATIONS: None immediate. PROCEDURE: The procedure was explained to the patient. The risks and benefits of the procedure were discussed and the patient's questions were addressed. Informed consent was obtained from the patient. The patient was placed prone on CT table. Images of the pelvis were obtained. The right side of back was prepped and draped in sterile fashion. The skin and right posterior ilium were anesthetized with 1% lidocaine. 11 gauge bone needle was directed into the right ilium with CT guidance. Two aspirates and 1 core biopsy were obtained. Bandage placed over the puncture site. IMPRESSION: CT guided bone marrow aspiration and core biopsy. Electronically Signed   By: Miachel Roux M.D.   On: 11/09/2021 13:12   CT Renal Stone Study  Result Date: 11/05/2021 CLINICAL DATA:  Nephrolithiasis. EXAM: CT ABDOMEN AND PELVIS WITHOUT CONTRAST TECHNIQUE: Multidetector CT imaging of the abdomen and pelvis was performed following the standard protocol without IV contrast. RADIATION DOSE REDUCTION: This exam was performed according to the departmental dose-optimization program which includes automated exposure control, adjustment of the mA and/or kV according to patient size and/or use of iterative reconstruction technique. COMPARISON:  10/13/2021 FINDINGS: Lower chest: The lung bases are clear of an acute process. No worrisome pulmonary lesions or pleural effusion. The heart is normal in size. No pericardial effusion. Aortic and coronary artery calcifications are noted. Hepatobiliary: No hepatic lesions are identified without contrast. No  intrahepatic biliary dilatation. Gallbladder is unremarkable. No common bile duct dilatation. Pancreas: No mass, inflammation or ductal dilatation. Spleen: Normal size.  No focal lesions. Adrenals/Urinary Tract: The adrenal glands are unremarkable and stable. No worrisome renal lesions or hydronephrosis. Small upper pole left renal cyst is stable. No obstructing ureteral calculi or bladder calculi. Stomach/Bowel: The stomach, duodenum, small bowel and colon are grossly. Stable advanced sigmoid colon diverticulosis without findings for acute diverticulitis. Vascular/Lymphatic: Stable advanced atherosclerotic calcifications involving the aorta and branch vessels. Stable 4.5 x 4.3 cm infrarenal abdominal aortic aneurysm. Recommend follow-up CT/MR every 6 months and vascular consultation. This recommendation follows ACR consensus guidelines: White Paper of the ACR Incidental Findings Committee II on Vascular Findings. J Am Coll Radiol 2013; 10:789-794. Reproductive: The prostate gland and seminal vesicles are unremarkable. Other: No pelvic mass or adenopathy. No free pelvic fluid collections. No inguinal mass or adenopathy. No abdominal wall hernia or subcutaneous lesions. Musculoskeletal: Persistent or recurrent significant abnormality surrounding the right hip. There appears to be a persistent large abscess and possibly some component of hematoma. Overall stable appearing lucency surrounding the right acetabular prosthesis and upper right femoral prosthesis likely reflecting particle disease with some erosive changes involving the upper medial acetabulum.  IMPRESSION: 1. Persistent or recurrent significant abnormality surrounding the right hip. There appears to be a persistent large abscess and possibly some component of hematoma. 2. No obstructing ureteral calculi or bladder calculi. 3. No significant acute intra-abdominal/intrapelvic process. 4. Stable 4.5 x 4.3 cm infrarenal abdominal aortic aneurysm. Aortic  Atherosclerosis (ICD10-I70.0). Electronically Signed   By: Marijo Sanes M.D.   On: 11/05/2021 15:05      Labs: BNP (last 3 results) Recent Labs    10/21/21 1341 11/17/21 1040 11/21/21 0640  BNP 175.5* 745.4* 3,710.6*   Basic Metabolic Panel: Recent Labs  Lab 11/18/21 0409 11/21/21 0640  NA 135 138  K 4.1 4.0  CL 99 103  CO2 27 27  GLUCOSE 132* 103*  BUN 33* 38*  CREATININE 6.02* 6.56*  CALCIUM 8.2* 8.6*  PHOS 5.6*  --    Liver Function Tests: Recent Labs  Lab 11/18/21 0409 11/21/21 0640  AST  --  36  ALT  --  21  ALKPHOS  --  77  BILITOT  --  0.5  PROT  --  6.3*  ALBUMIN 1.9* 2.2*   Recent Labs  Lab 11/21/21 0640  LIPASE 28   Recent Labs  Lab 11/21/21 0640  AMMONIA 16   CBC: Recent Labs  Lab 11/18/21 0409 11/19/21 1013 11/21/21 0640  WBC 5.3  --  5.2  NEUTROABS  --   --  4.0  HGB 6.8* 8.9* 8.6*  HCT 21.7*  --  27.8*  MCV 90.0  --  92.4  PLT 121*  --  129*   Cardiac Enzymes: Recent Labs  Lab 11/16/21 0548  CKTOTAL 112   BNP: Invalid input(s): POCBNP CBG: Recent Labs  Lab 11/21/21 0648  GLUCAP 95   D-Dimer No results for input(s): DDIMER in the last 72 hours. Hgb A1c No results for input(s): HGBA1C in the last 72 hours. Lipid Profile No results for input(s): CHOL, HDL, LDLCALC, TRIG, CHOLHDL, LDLDIRECT in the last 72 hours. Thyroid function studies Recent Labs    11/21/21 0640  TSH 12.481*   Anemia work up No results for input(s): VITAMINB12, FOLATE, FERRITIN, TIBC, IRON, RETICCTPCT in the last 72 hours. Urinalysis    Component Value Date/Time   COLORURINE YELLOW (A) 11/05/2021 1604   APPEARANCEUR TURBID (A) 11/05/2021 1604   LABSPEC 1.019 11/05/2021 1604   PHURINE 5.0 11/05/2021 1604   GLUCOSEU NEGATIVE 11/05/2021 1604   HGBUR MODERATE (A) 11/05/2021 1604   BILIRUBINUR NEGATIVE 11/05/2021 1604   KETONESUR 5 (A) 11/05/2021 1604   PROTEINUR 100 (A) 11/05/2021 1604   NITRITE NEGATIVE 11/05/2021 1604   LEUKOCYTESUR  NEGATIVE 11/05/2021 1604   Sepsis Labs Invalid input(s): PROCALCITONIN,  WBC,  LACTICIDVEN Microbiology No results found for this or any previous visit (from the past 240 hour(s)).   Total time spend on discharging this patient, including the last patient exam, discussing the hospital stay, instructions for ongoing care as it relates to all pertinent caregivers, as well as preparing the medical discharge records, prescriptions, and/or referrals as applicable, is 50 minutes.    Enzo Bi, MD  [facility] 11/22/2021, 1:01 PM

## 2021-11-22 NOTE — Progress Notes (Signed)
Spoke with Gwenyth Ober. Counsellor for EMS for today.  Doren Custard states he hopes to have a truck available for pick up at the hospital around 2000.  Called and notified Butch Penny and also notified Secondary school teacher.  Please call with any concerns or questions. ? ?Dimas Aguas, RN ?[facility] ?[phone] ?

## 2021-11-23 ENCOUNTER — Telehealth: Payer: Self-pay | Admitting: Pulmonary Disease

## 2021-11-23 NOTE — Telephone Encounter (Signed)
I think the reason for hospice is due to his malignancy and chemo and multiorgan failure.  The best thing would be to contact Billey Chang NP at oncology to see if his oncologist will be primary for hospice. ?

## 2021-11-23 NOTE — Telephone Encounter (Signed)
Spoke to [facility] with Hospice and relayed below message. She voiced her understanding. ?She will reach out to Burnell Blanks, NP. ?Nothing further needed.  ? ?

## 2021-11-23 NOTE — Telephone Encounter (Signed)
Spoke to [facility] with Hospice. ?She is questioning if Dr. Patsey Berthold will be attending.  ? ?Dr. Patsey Berthold, please advise. Thanks ?

## 2021-11-24 ENCOUNTER — Telehealth: Payer: No Typology Code available for payment source | Admitting: Infectious Diseases

## 2021-11-30 ENCOUNTER — Other Ambulatory Visit: Payer: Medicare Other | Admitting: Student

## 2021-12-01 ENCOUNTER — Encounter (INDEPENDENT_AMBULATORY_CARE_PROVIDER_SITE_OTHER): Payer: Self-pay | Admitting: Vascular Surgery

## 2021-12-01 ENCOUNTER — Encounter: Payer: Self-pay | Admitting: Oncology

## 2021-12-03 ENCOUNTER — Ambulatory Visit: Payer: No Typology Code available for payment source | Admitting: Pulmonary Disease

## 2021-12-10 DEATH — deceased

## 2022-01-05 ENCOUNTER — Other Ambulatory Visit (INDEPENDENT_AMBULATORY_CARE_PROVIDER_SITE_OTHER): Payer: Medicare Other

## 2022-01-05 ENCOUNTER — Encounter: Payer: Self-pay | Admitting: Pulmonary Disease

## 2022-01-05 ENCOUNTER — Encounter (INDEPENDENT_AMBULATORY_CARE_PROVIDER_SITE_OTHER): Payer: Medicare Other

## 2022-01-05 ENCOUNTER — Ambulatory Visit (INDEPENDENT_AMBULATORY_CARE_PROVIDER_SITE_OTHER): Payer: Medicare Other | Admitting: Vascular Surgery

## 2022-02-10 ENCOUNTER — Ambulatory Visit: Payer: No Typology Code available for payment source | Admitting: Radiation Oncology

## 2023-02-21 IMAGING — CT CT CHEST W/O CM
2 of 4 series · 15 of 36 positions shown, 18 images · non-contrast
Comparison: Chest CT dated 09/29/2020.

CLINICAL DATA: 69-year-old male with non-small cell lung cancer of
the left upper lobe. Staging.

EXAM:
CT CHEST WITHOUT CONTRAST
TECHNIQUE: Multidetector CT imaging of the chest was performed following the
standard protocol without IV contrast.

[Series 2: chest 2.00 · axial · 0.78mm/px · z∈[-1251,-921]mm · 12 of 196 slices shown, 15 images]
[im 16/196  mediastinal]
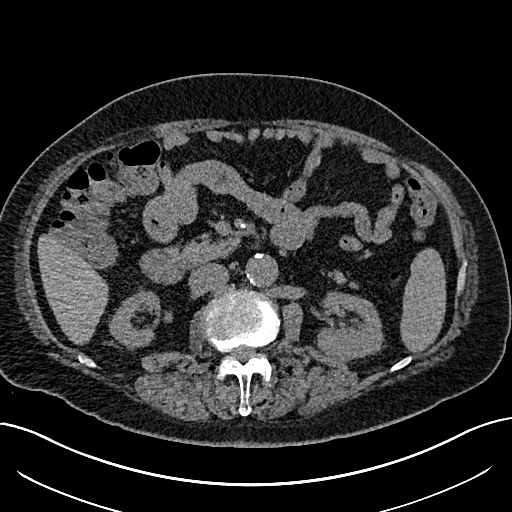
[im 16/196  lung]
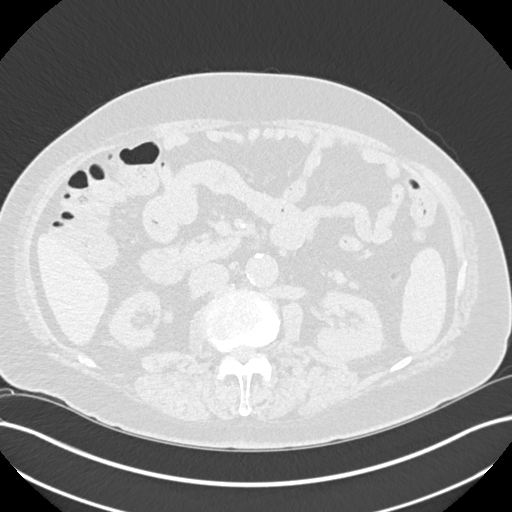
[im 31/196  lung]
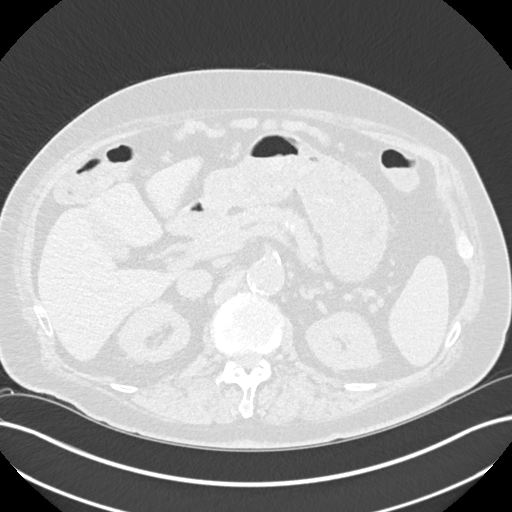
[im 46/196  lung]
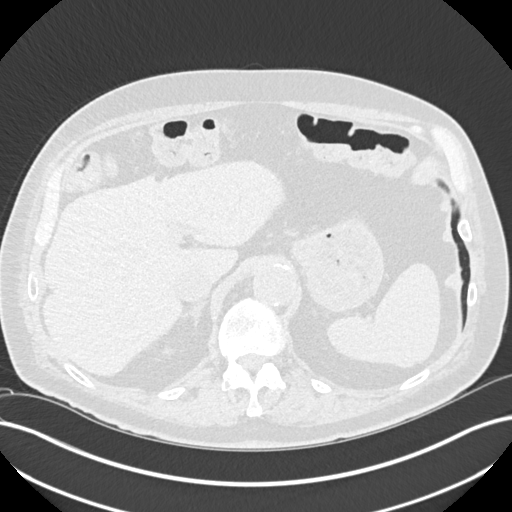
[im 61/196  lung]
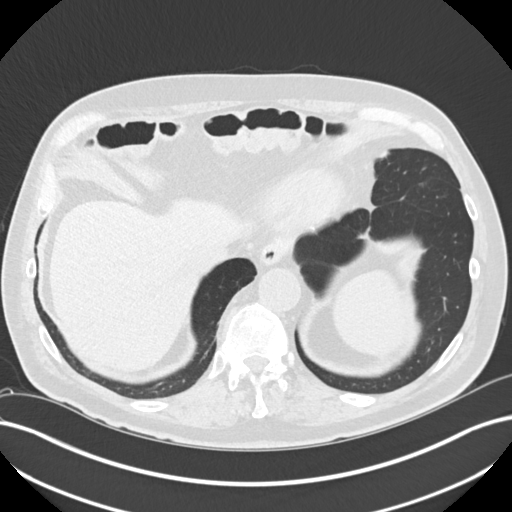
[im 76/196  mediastinal]
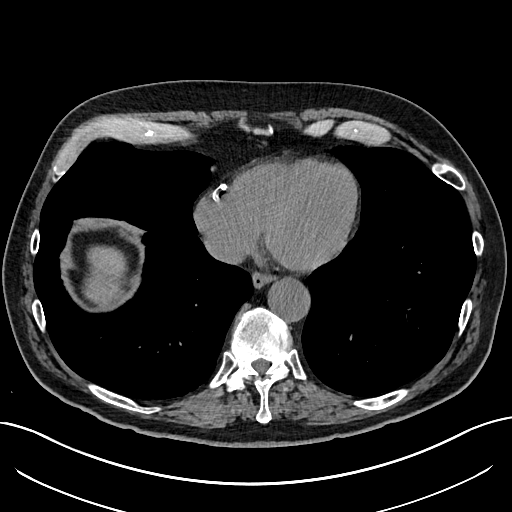
[im 76/196  lung]
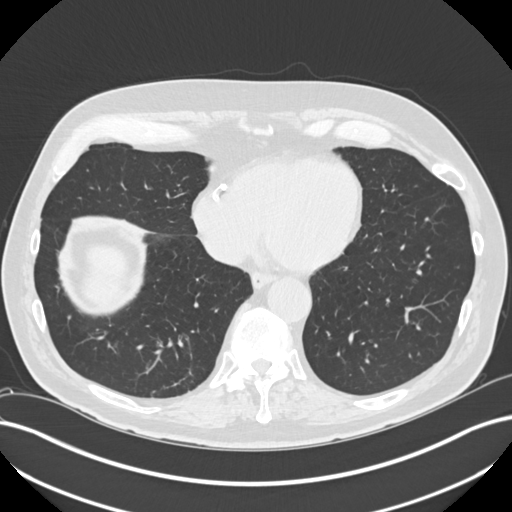
[im 91/196  lung]
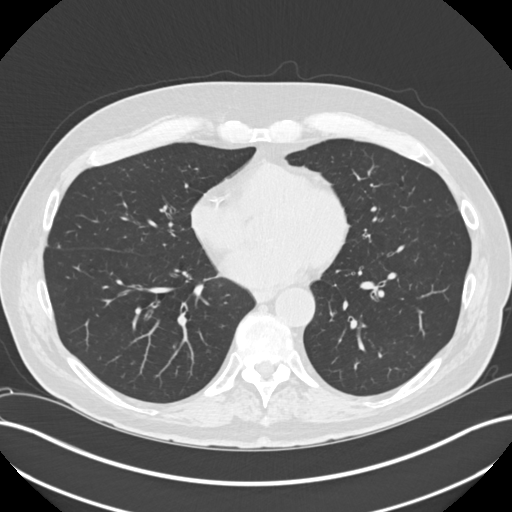
[im 106/196  lung]
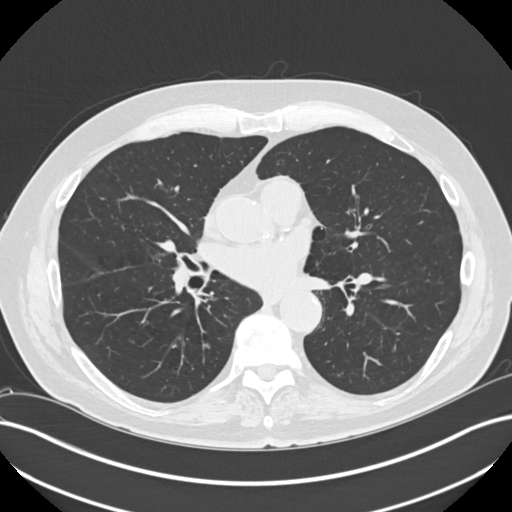
[im 121/196  lung]
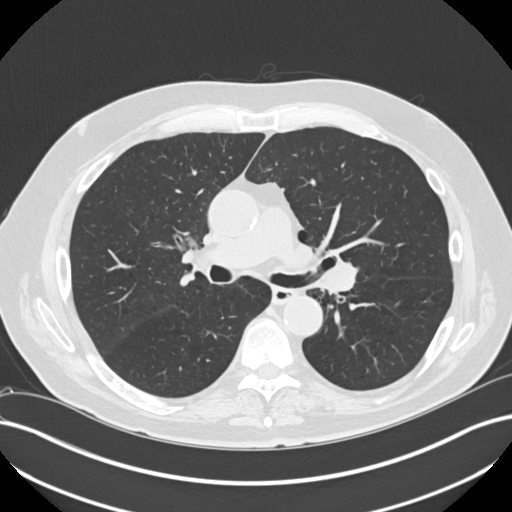
[im 136/196  mediastinal]
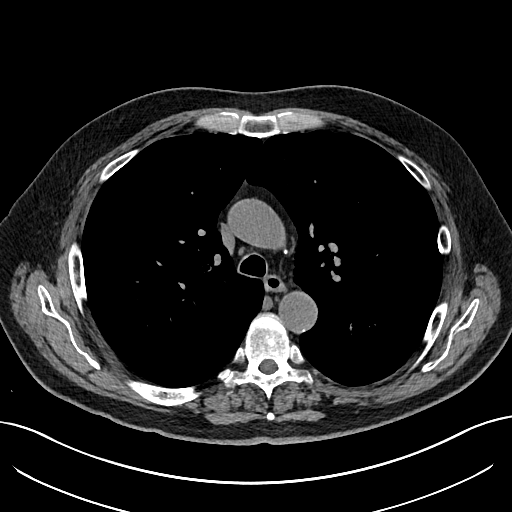
[im 136/196  lung]
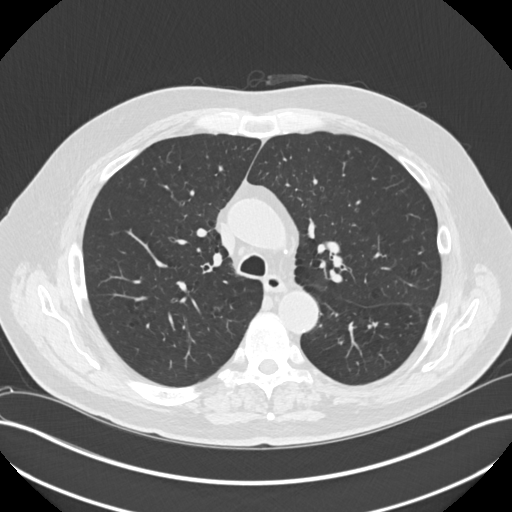
[im 151/196  lung]
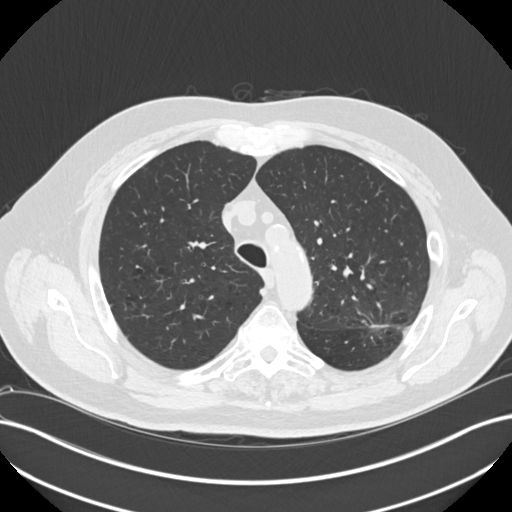
[im 166/196  lung]
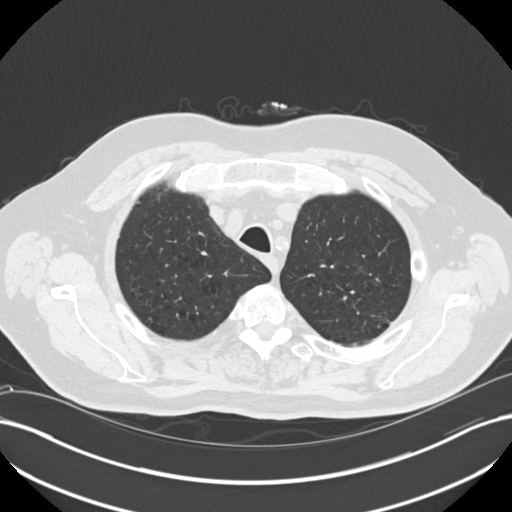
[im 181/196  lung]
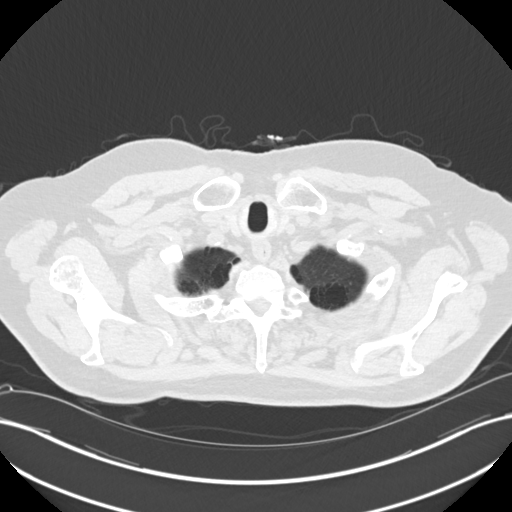

[Series 5: coronals chest 2.00 cor · coronal · 0.77mm/px · 3 of 155 slices shown]
[im 31/155  lung]
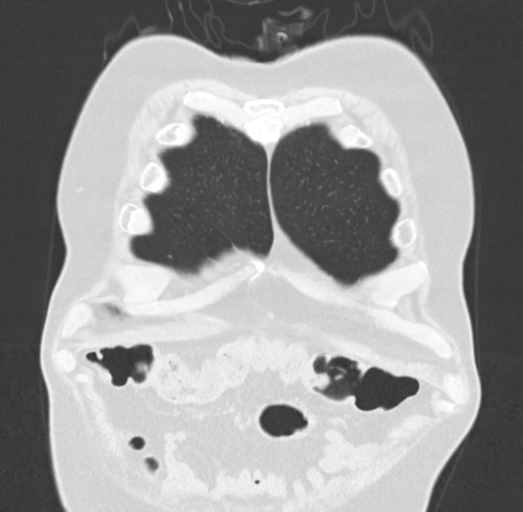
[im 62/155  lung]
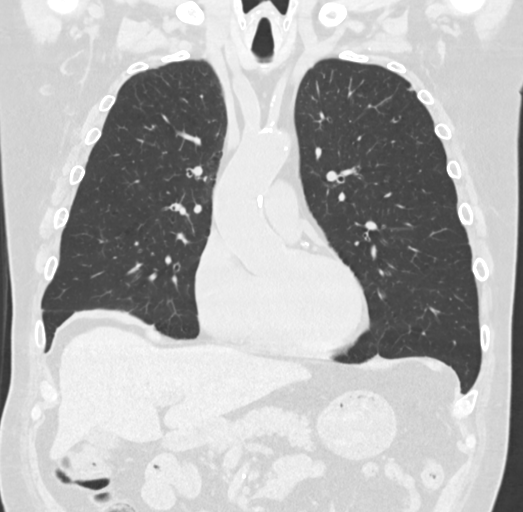
[im 93/155  lung]
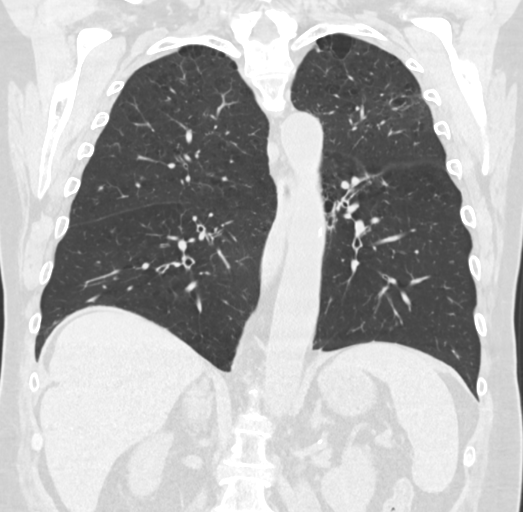

[15 of 36 positions shown; findings below may reference images not displayed]

FINDINGS: Evaluation of this exam is limited in the absence of intravenous
contrast.

Cardiovascular: There is no cardiomegaly or pericardial effusion.
There is advanced 3 vessel coronary vascular calcification. There is
mild atherosclerotic calcification of the thoracic aorta. No
aneurysmal dilatation. Partially visualized infrarenal abdominal
aortic aneurysm measuring approximately 3.7 cm in diameter. CT of
the abdomen pelvis with IV contrast recommended for better
evaluation of the abdominal aorta. The central pulmonary arteries
are unremarkable.

Mediastinum/Nodes: No hilar adenopathy. Evaluation however is
limited in the absence of intravenous contrast. Mildly enlarged
lymph node measuring approximately 12 mm short axis posterior to the
left main pulmonary artery (66/2) previously measured 10 mm short
axis. The esophagus and the thyroid gland are grossly unremarkable.
No mediastinal fluid collection.

Lungs/Pleura: Background of centrilobular emphysema. There is an 11
mm nodule in the left upper lobe along the fissure (42/3), unchanged
since the prior CT. There is a 6 mm nodule in the right upper lobe
(34/3), unchanged. A 7 mm right lower lobe nodule (101/3),
unchanged. No focal consolidation, pleural effusion, or
pneumothorax. The central airways are patent.

Upper Abdomen: Similar appearance of an 18 mm hypodense lesion from
the upper pole of the left kidney as well as a smaller hypodense
lesion arising from the upper pole of the right kidney.

Musculoskeletal: Osteopenia with degenerative changes of the spine.
No acute osseous pathology.
IMPRESSION: 1. No acute intrathoracic pathology. No significant interval change
in the size or appearance of the bilateral pulmonary nodules as
described above. No new nodules.
2. Mildly enlarged mediastinal lymph node, slightly increased in
size since the prior CT.
3. Partially visualized infrarenal abdominal aortic aneurysm
measuring approximately 3.7 cm in diameter.
4. Aortic Atherosclerosis (OQPP7-HY3.3) and Emphysema (OQPP7-6UC.1).

## 2023-03-18 IMAGING — CT CT ANGIO AOBIFEM WO/W CM
2 of 10 series · 11 of 46 positions shown, 15 images · IV contrast (APPLIED)
Comparison: None.

CLINICAL DATA: Lower extremity pain and swelling, decreased
capillary refill, right calf cramping

EXAM:
CT ANGIOGRAPHY OF ABDOMINAL AORTA WITH ILIOFEMORAL RUNOFF
TECHNIQUE: Multidetector CT imaging of the abdomen, pelvis and lower
extremities was performed using the standard protocol during bolus
administration of intravenous contrast. Multiplanar CT image
reconstructions and MIPs were obtained to evaluate the vascular
anatomy.
CONTRAST:  125mL OMNIPAQUE IOHEXOL 350 MG/ML SOLN

[Series 5: coronal upper · coronal · 0.81mm/px · 1 of 138 slices shown, 2 images]
[im 69/138  soft-tissue]
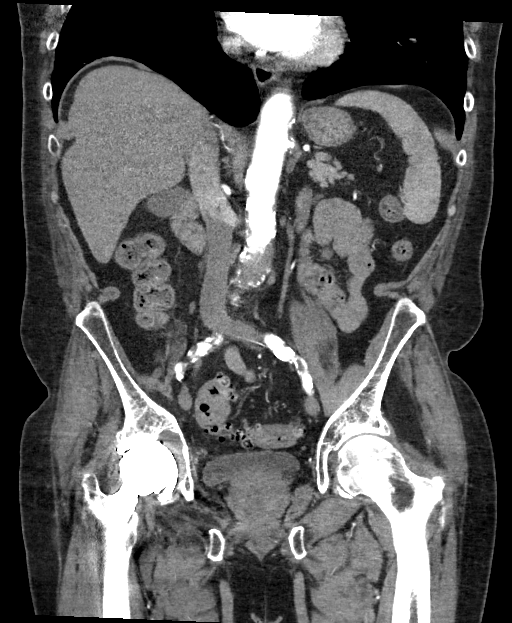
[im 69/138  bone]
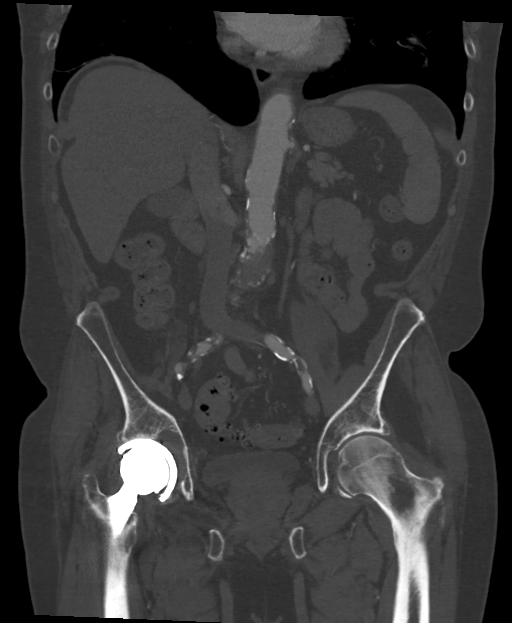

[Series 10: axial arterial lower (person_name) · axial · arterial · 0.78mm/px · z∈[-405,+384]mm · 10 of 310 slices shown, 13 images]
[im 31/310  soft-tissue]
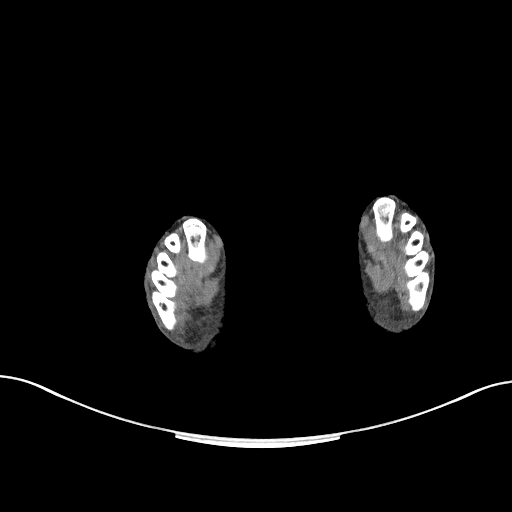
[im 31/310  bone]
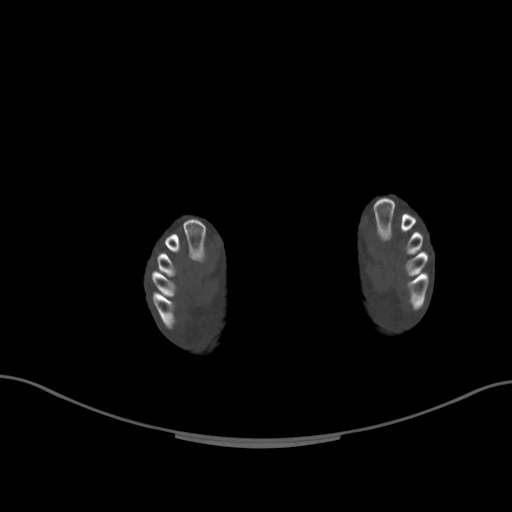
[im 62/310  soft-tissue]
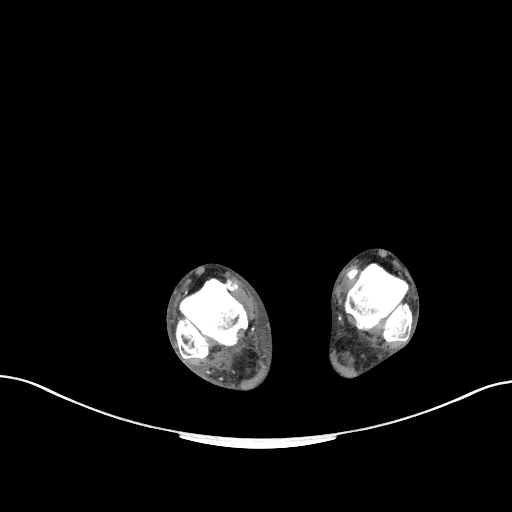
[im 93/310  soft-tissue]
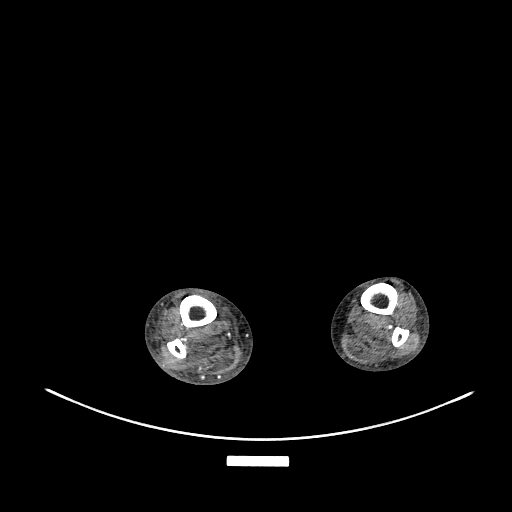
[im 140/310  soft-tissue]
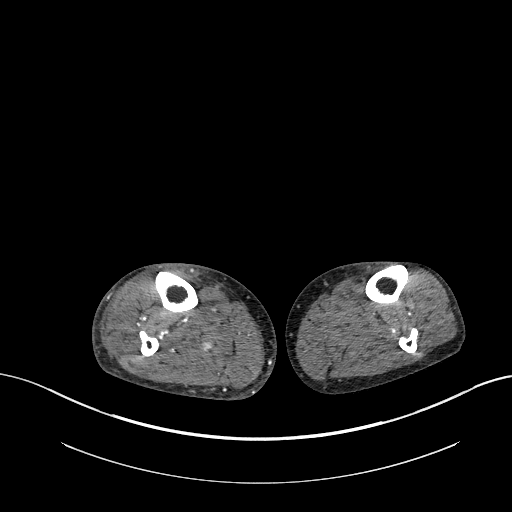
[im 170/310  soft-tissue]
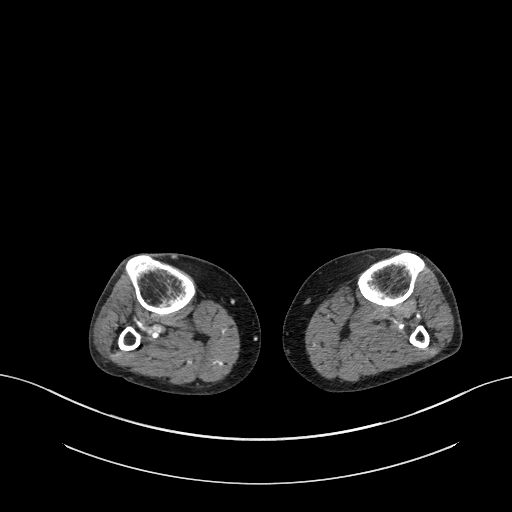
[im 217/310  soft-tissue]
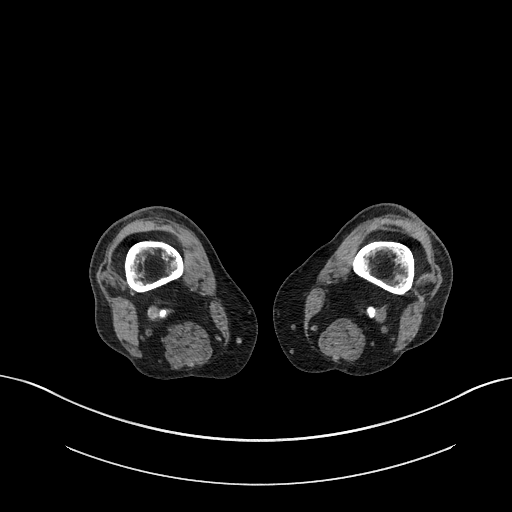
[im 248/310  soft-tissue]
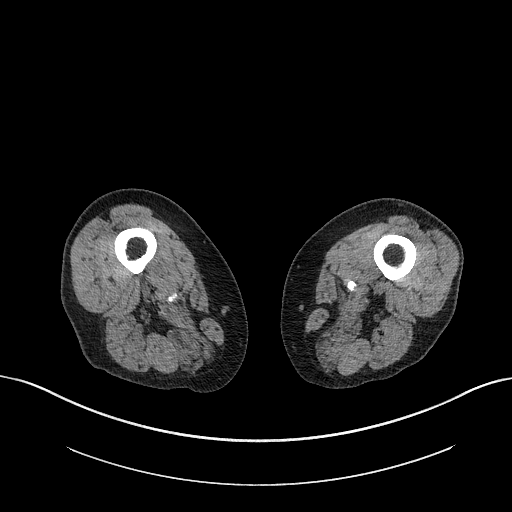
[im 248/310  lung]
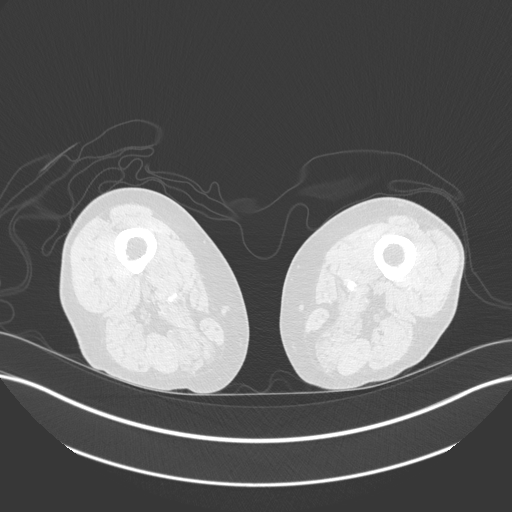
[im 263/310  lung]
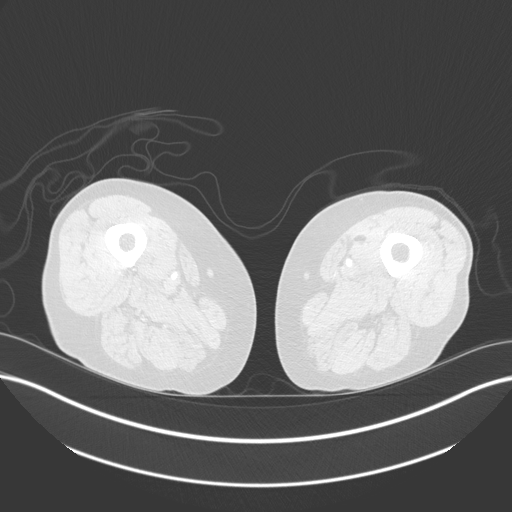
[im 279/310  soft-tissue]
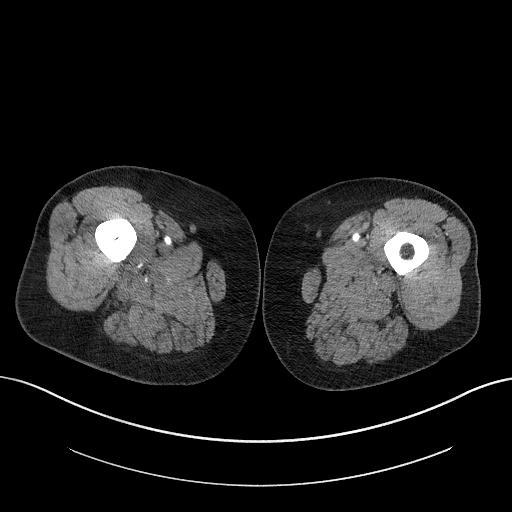
[im 279/310  lung]
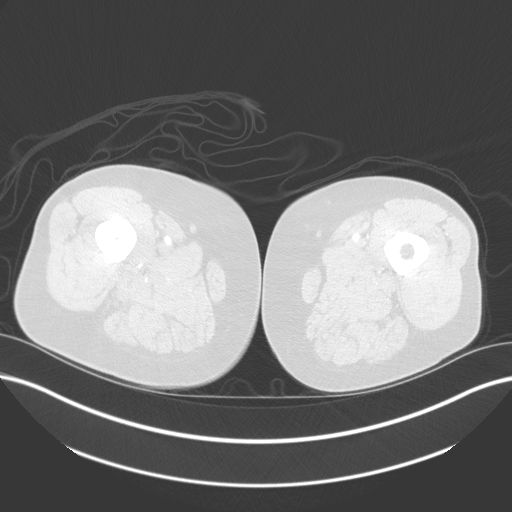
[im 294/310  lung]
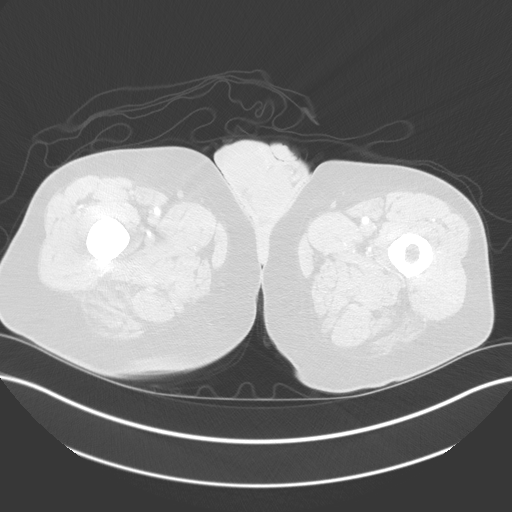

[11 of 46 positions shown; findings below may reference images not displayed]

FINDINGS: VASCULAR

Aorta: The supra celiac and pararenal aorta are of normal caliber.
An fusiform infrarenal abdominal aortic aneurysm is present with
maximal transaxial dimensions of 4.1 x 4.2 cm (coronal # 61,
sagittal # 103), terminating at the aortic bifurcation.

A penetrating atherosclerotic ulcer is seen involving the left
anteromedial aortic wall at the level of the origin of the celiac
axis measuring 5 x 8 mm in greatest dimension. No evidence of
rupture. No intramural hematoma.

Moderate mixed atherosclerotic plaque. No evidence of
hemodynamically significant stenosis. No dissection.

Celiac: Widely patent. Normal anatomic configuration. No aneurysm or
dissection.

SMA: Less than 50% stenosis of the origin. Otherwise widely patent.
No aneurysm or dissection.

Renals: Single renal arteries bilaterally. Left renal artery
demonstrates normal morphology. Focal dissection involving the
proximal right renal artery results in a greater than 75% stenosis
of the vessel, best seen on axial # 50/coronal # 65. The vessels are
otherwise widely patent.

IMA: Occluded at its origin.  Reconstituted by the marginal artery.

RIGHT Lower Extremity

Inflow: Multifocal mixed atherosclerotic plaque results in a focal
50% stenosis of the right common iliac artery and proximal right
external iliac artery. Focal short segment dissection noted within
the right external iliac artery, axial # 109, though this does not
appear flow limiting. Internal iliac artery demonstrates a greater
than 75% stenosis at its origin.

Outflow: Moderate atherosclerotic plaque within the common femoral
artery, less than 50% stenosis. Calcified atherosclerotic plaque
results in a roughly 50% stenosis of the profundus femoral artery at
its origin. Right superficial femoral artery demonstrates scattered
mixed atherosclerotic plaque with a roughly 2.5 cm 50% stenosis
involving the distal SFA secondary to eccentric noncalcified plaque.

Runoff: Focal 50-75% stenosis of the P2 segment of the popliteal
artery secondary to largely noncalcified plaque. Estimated 50%
stenosis of the anterior tibial artery at its origin. Three-vessel
runoff to the right foot. Patency of the dorsalis pedis artery and
posterior tibial artery.

LEFT Lower Extremity

Inflow: Scattered mixed plaque results in a focal 50-75% stenosis of
the left external iliac artery beyond the bifurcation. Internal
iliac artery is patent at its origin but is heavily diseased.

Outflow: Less than 50% stenosis of the common femoral artery. Less
than 50% stenosis of the profundus femoral artery at its origin.
Scattered atherosclerotic plaque throughout the superficial femoral
artery without evidence of hemodynamically significant stenosis.

Runoff: Less than 50% stenosis of the P1 segment of the popliteal
artery secondary to mixed plaque. Posterior tibial artery occludes
shortly beyond its takeoff. Two vessel runoff to the left foot.
Peroneal artery reconstitutes the plantar arch. Dorsalis pedis
artery is patent.

Veins: Not optimally opacified on this examination. Retroaortic left
renal vein noted.

Review of the MIP images confirms the above findings.

NON-VASCULAR

Lower chest: Visualized lung bases are clear. Moderate right
coronary artery calcification.

Hepatobiliary: No focal liver abnormality is seen. No gallstones,
gallbladder wall thickening, or biliary dilatation.

Pancreas: Unremarkable

Spleen: Unremarkable

Adrenals/Urinary Tract: The adrenal glands are unremarkable. The
kidneys are normal in position. Mild relative right renal cortical
atrophy. Simple cortical cyst noted within the upper pole of the
right kidney. No intrarenal masses or calcifications are seen. No
hydronephrosis. Bladder unremarkable.

Stomach/Bowel: Severe sigmoid diverticulosis. Stomach, small bowel,
and large bowel are otherwise unremarkable. No free intraperitoneal
gas or fluid.

Lymphatic: No pathologic adenopathy within the abdomen and pelvis.

Reproductive: Mild prostatic enlargement.

Other: No abdominal wall hernia.

Musculoskeletal: Right total hip arthroplasty has been performed. No
acute bone abnormality. Osseous structures are age-appropriate.
IMPRESSION: VASCULAR

4.2 cm infrarenal abdominal aortic aneurysm. Recommend follow-up
every 12 months and vascular consultation. This recommendation
follows ACR consensus guidelines: White Paper of the ACR Incidental
Findings Committee II on Vascular Findings. [HOSPITAL] 2428;
[DATE].

8 mm penetrating atherosclerotic ulcer involving the periceliac
abdominal aorta. No intramural hematoma or rupture.

Focal dissection involving the proximal, solitary right renal artery
resulting in a greater than 75% stenosis.

Occlusion of the inferior mesenteric artery with reconstitution by
the marginal artery.

Multiple serial stenoses of the right lower extremity arterial
inflow, outflow, and runoff with serial 50% stenoses of the right
lower extremity arterial inflow,, tandem stenoses of the profundus
femoral artery origin and distal right SFA, and focal stenosis of
the a P2 segment of the right popliteal artery. Superimposed short
segment non flow limiting dissection of the right external iliac
artery. Three-vessel runoff to the right foot.

Multiple serial stenoses involving the left lower extremity arterial
inflow and runoff including a focal 50% stenosis of the left
external iliac artery and occlusion of the left posterior tibial
artery. Two vessel runoff to the left foot.

NON-VASCULAR

Severe sigmoid diverticulosis.

## 2023-04-14 IMAGING — DX DG HAND COMPLETE 3+V*R*
3 series · 3 of 3 positions shown · non-contrast
Comparison: None

CLINICAL DATA: Pain/injury.  Fall on outstretched hand 2 days ago.

EXAM:
RIGHT HAND - COMPLETE 3+ VIEW

[hand ap]
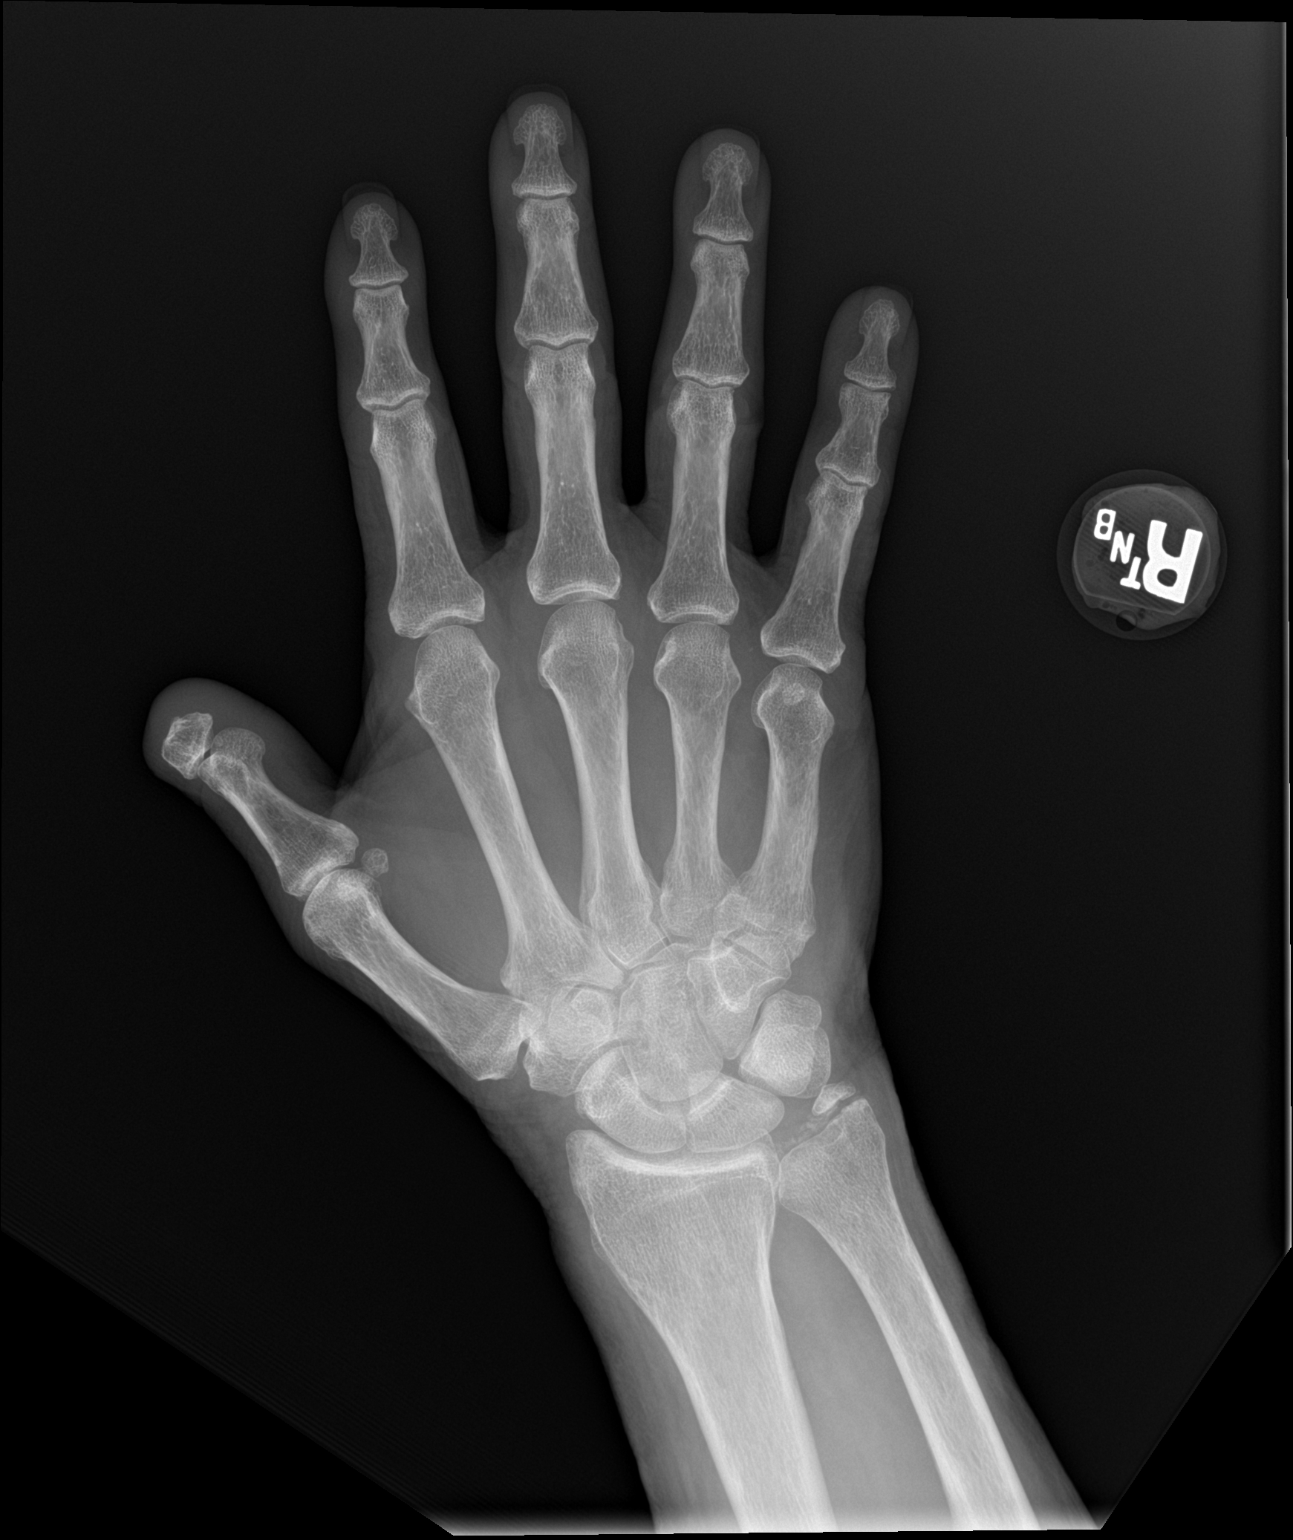

[hand obl]
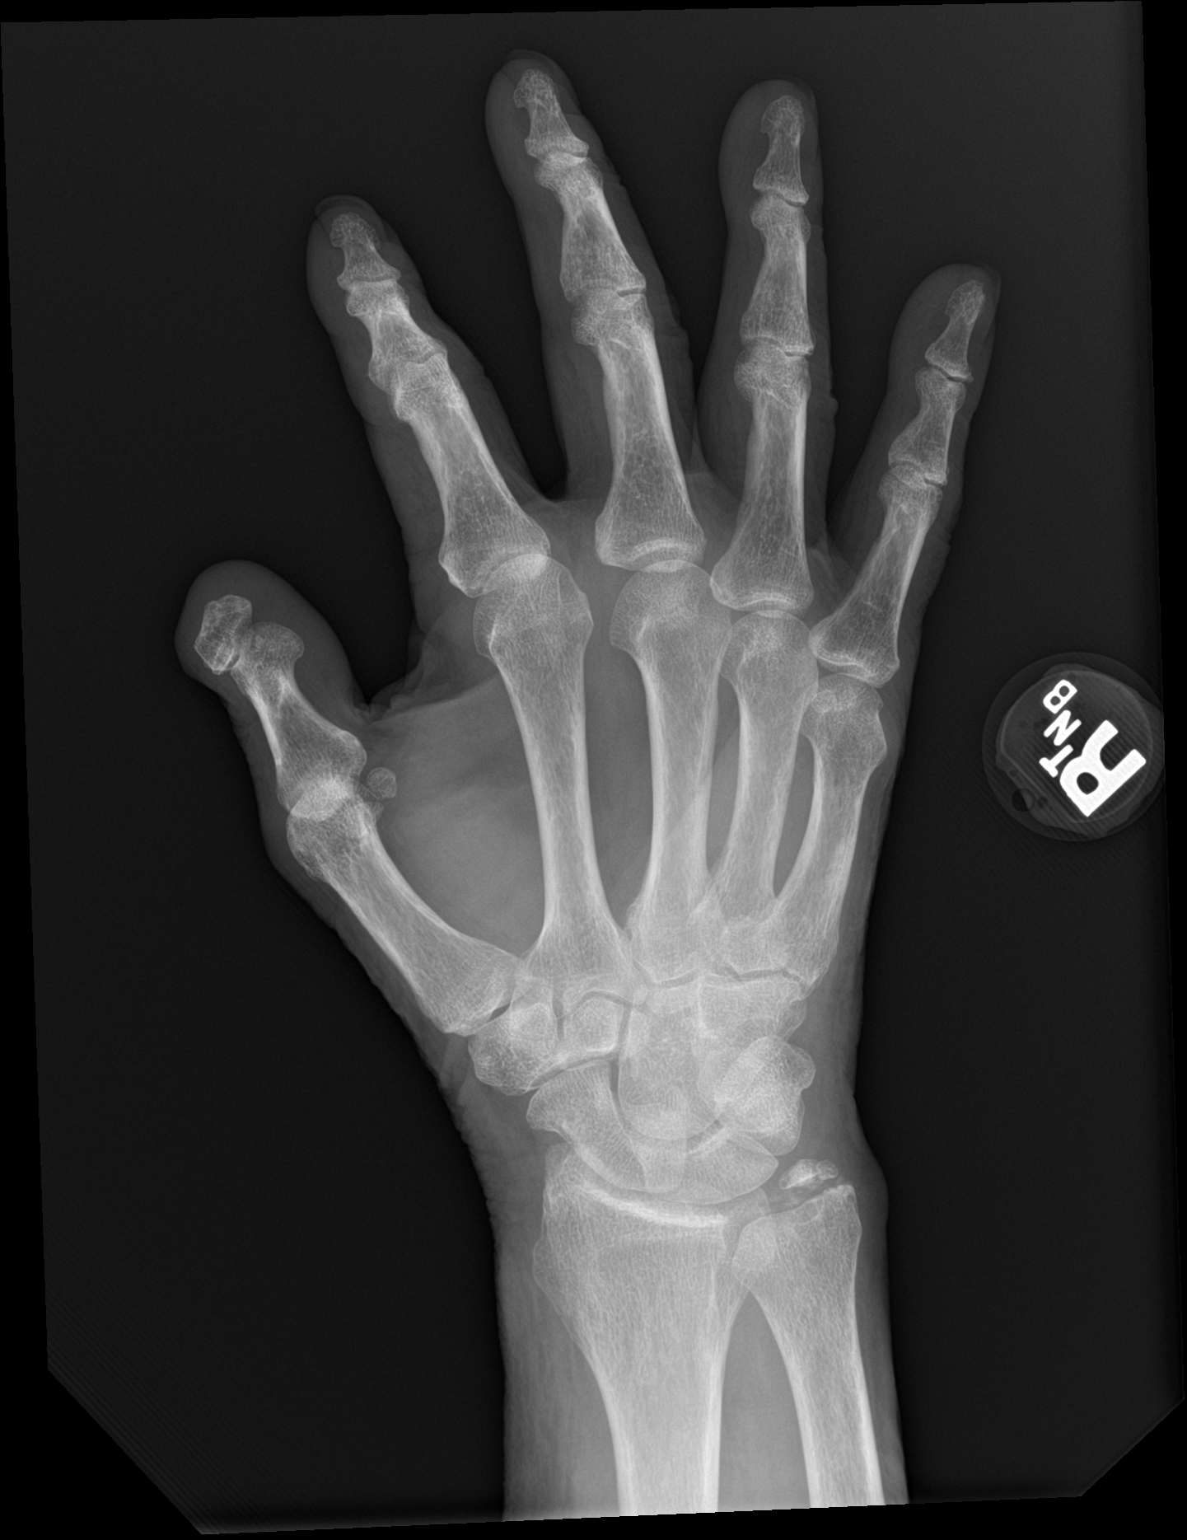

[hand lat]
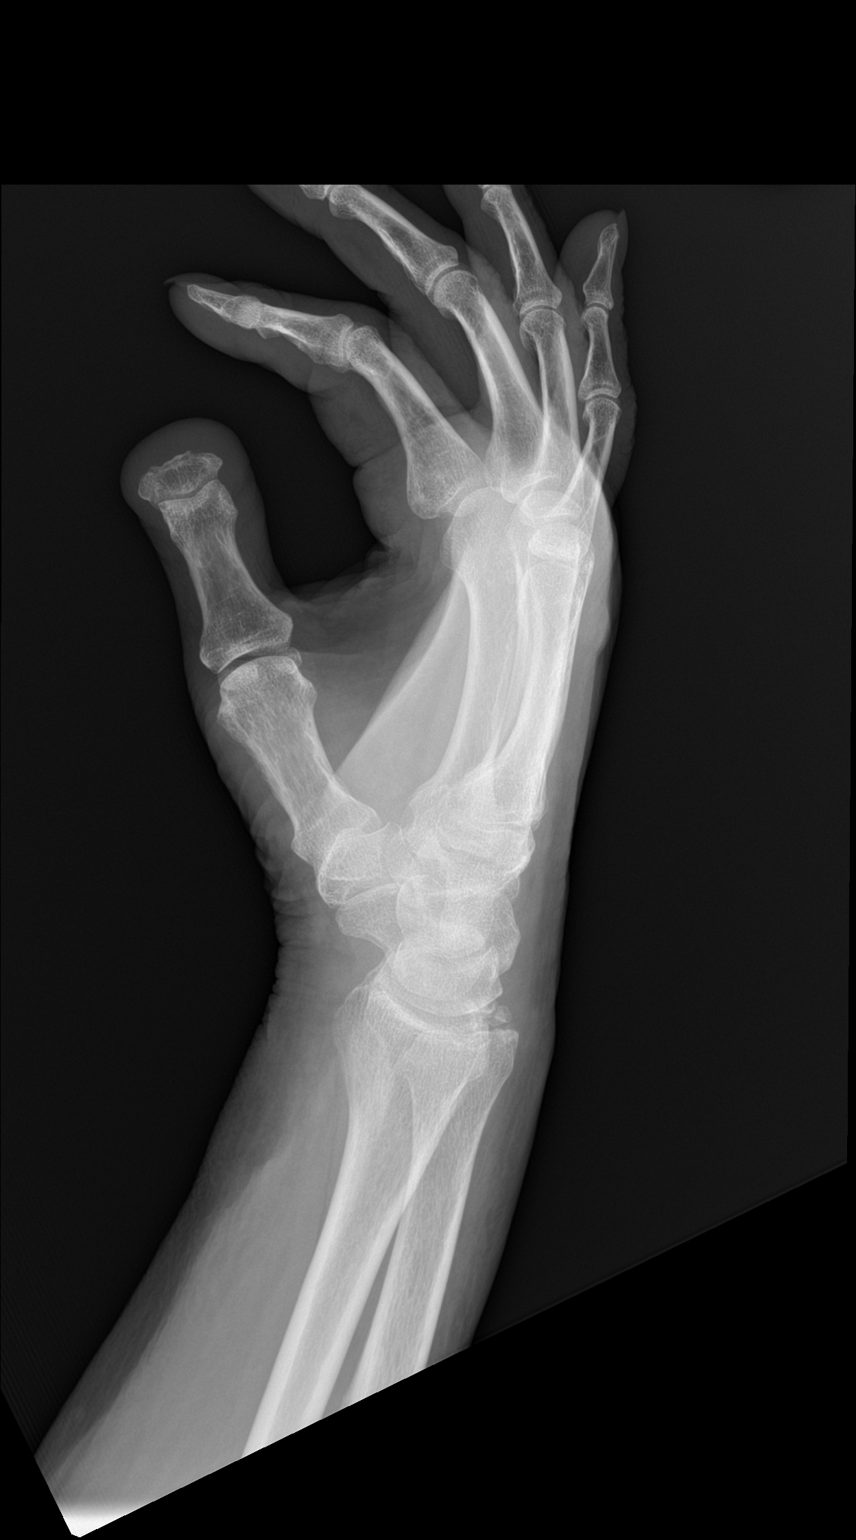

[3 of 3 positions shown; findings below may reference images not displayed]

FINDINGS: Well corticated bone fragment along the distal ulna likely remote
ulnar styloid fracture. Chondrocalcinosis in the area of the TFC.

Signs of distal phalangeal partial amputation of the RIGHT thumb.
This shows well corticated margins. No sign of acute fracture or
dislocation. No significant soft tissue swelling.
IMPRESSION: 1. No acute fracture or dislocation.
2. Signs of partial amputation of the distal phalanx of the RIGHT
thumb, findings appear chronic. Correlate with physical exam and
patient history.
3. Signs of chronic injury to the ulnar styloid and TFC.

## 2023-07-14 NOTE — Telephone Encounter (Signed)
 Error
